# Patient Record
Sex: Female | Born: 1965 | Race: White | Hispanic: No | Marital: Married | Smoking: Former smoker
Health system: Southern US, Community
[De-identification: ages and names within clinical notes are randomized; demographics above are authoritative.]

## PROBLEM LIST (undated history)

## (undated) DIAGNOSIS — T8859XA Other complications of anesthesia, initial encounter: Secondary | ICD-10-CM

## (undated) DIAGNOSIS — K635 Polyp of colon: Secondary | ICD-10-CM

## (undated) DIAGNOSIS — I639 Cerebral infarction, unspecified: Secondary | ICD-10-CM

## (undated) DIAGNOSIS — J45909 Unspecified asthma, uncomplicated: Secondary | ICD-10-CM

## (undated) DIAGNOSIS — T4145XA Adverse effect of unspecified anesthetic, initial encounter: Secondary | ICD-10-CM

## (undated) DIAGNOSIS — N840 Polyp of corpus uteri: Secondary | ICD-10-CM

## (undated) DIAGNOSIS — Z87442 Personal history of urinary calculi: Secondary | ICD-10-CM

## (undated) DIAGNOSIS — F32A Depression, unspecified: Secondary | ICD-10-CM

## (undated) DIAGNOSIS — K219 Gastro-esophageal reflux disease without esophagitis: Secondary | ICD-10-CM

## (undated) DIAGNOSIS — F319 Bipolar disorder, unspecified: Secondary | ICD-10-CM

## (undated) DIAGNOSIS — A4151 Sepsis due to Escherichia coli [E. coli]: Secondary | ICD-10-CM

## (undated) DIAGNOSIS — K259 Gastric ulcer, unspecified as acute or chronic, without hemorrhage or perforation: Secondary | ICD-10-CM

## (undated) DIAGNOSIS — G4730 Sleep apnea, unspecified: Secondary | ICD-10-CM

## (undated) DIAGNOSIS — E039 Hypothyroidism, unspecified: Secondary | ICD-10-CM

## (undated) DIAGNOSIS — E785 Hyperlipidemia, unspecified: Secondary | ICD-10-CM

## (undated) DIAGNOSIS — N10 Acute tubulo-interstitial nephritis: Secondary | ICD-10-CM

## (undated) DIAGNOSIS — F329 Major depressive disorder, single episode, unspecified: Secondary | ICD-10-CM

## (undated) HISTORY — DX: Major depressive disorder, single episode, unspecified: F32.9

## (undated) HISTORY — DX: Polyp of corpus uteri: N84.0

## (undated) HISTORY — DX: Unspecified asthma, uncomplicated: J45.909

## (undated) HISTORY — DX: Gastric ulcer, unspecified as acute or chronic, without hemorrhage or perforation: K25.9

## (undated) HISTORY — DX: Polyp of colon: K63.5

## (undated) HISTORY — PX: OTHER SURGICAL HISTORY: SHX169

## (undated) HISTORY — PX: COLONOSCOPY: SHX174

## (undated) HISTORY — DX: Acute pyelonephritis: N10

## (undated) HISTORY — PX: ABLATION ON ENDOMETRIOSIS: SHX5787

## (undated) HISTORY — DX: Gastro-esophageal reflux disease without esophagitis: K21.9

## (undated) HISTORY — DX: Depression, unspecified: F32.A

## (undated) HISTORY — DX: Hyperlipidemia, unspecified: E78.5

## (undated) HISTORY — PX: TEMPOROMANDIBULAR JOINT SURGERY: SHX35

## (undated) HISTORY — DX: Cerebral infarction, unspecified: I63.9

## (undated) HISTORY — DX: Bipolar disorder, unspecified: F31.9

## (undated) HISTORY — DX: Hypothyroidism, unspecified: E03.9

## (undated) HISTORY — PX: TUBAL LIGATION: SHX77

## (undated) HISTORY — PX: OVARIAN CYST SURGERY: SHX726

---
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
## 1997-11-23 ENCOUNTER — Ambulatory Visit (HOSPITAL_COMMUNITY): Admission: RE | Admit: 1997-11-23 | Discharge: 1997-11-23 | Payer: Self-pay | Admitting: Oral Surgery

## 1997-12-31 ENCOUNTER — Ambulatory Visit (HOSPITAL_COMMUNITY): Admission: RE | Admit: 1997-12-31 | Discharge: 1997-12-31 | Payer: Self-pay | Admitting: Oral Surgery

## 2018-01-14 DIAGNOSIS — G4733 Obstructive sleep apnea (adult) (pediatric): Secondary | ICD-10-CM | POA: Diagnosis not present

## 2018-01-15 ENCOUNTER — Encounter: Payer: Self-pay | Admitting: Family Medicine

## 2018-01-15 ENCOUNTER — Ambulatory Visit (INDEPENDENT_AMBULATORY_CARE_PROVIDER_SITE_OTHER): Payer: Medicare Other | Admitting: Family Medicine

## 2018-01-15 VITALS — BP 112/78 | HR 92 | Temp 98.2°F | Ht 65.0 in | Wt 211.0 lb

## 2018-01-15 DIAGNOSIS — Z8719 Personal history of other diseases of the digestive system: Secondary | ICD-10-CM

## 2018-01-15 DIAGNOSIS — Z8711 Personal history of peptic ulcer disease: Secondary | ICD-10-CM | POA: Insufficient documentation

## 2018-01-15 DIAGNOSIS — N950 Postmenopausal bleeding: Secondary | ICD-10-CM | POA: Insufficient documentation

## 2018-01-15 DIAGNOSIS — F3177 Bipolar disorder, in partial remission, most recent episode mixed: Secondary | ICD-10-CM | POA: Insufficient documentation

## 2018-01-15 DIAGNOSIS — Z7689 Persons encountering health services in other specified circumstances: Secondary | ICD-10-CM | POA: Diagnosis not present

## 2018-01-15 DIAGNOSIS — K635 Polyp of colon: Secondary | ICD-10-CM | POA: Insufficient documentation

## 2018-01-15 DIAGNOSIS — K2100 Gastro-esophageal reflux disease with esophagitis, without bleeding: Secondary | ICD-10-CM | POA: Insufficient documentation

## 2018-01-15 DIAGNOSIS — E039 Hypothyroidism, unspecified: Secondary | ICD-10-CM | POA: Insufficient documentation

## 2018-01-15 NOTE — Progress Notes (Signed)
Subjective: QA:STMHDQQIW care, postmenopausal bleeding HPI: Rebecca Moreno is a 52 y.o. female presenting to clinic today for:  1.  Postmenopausal bleeding Patient reports that she had onset of postmenopausal bleeding last month that lasted about 2 weeks.  She certain that this is coming from the vagina and not the rectum.  She states that she had been without a period for over 7 years.  Denies any preceding vaginal discharge, pelvic pain or postcoital bleeding.  She has a history of BTL.  She has 1 daughter who is healthy.  She has had some associated breast tenderness and nipple sensitivity but otherwise, no other associated symptoms.  She notes that the bleeding has stopped but she was very concerned because she has not had a period in years.  No known personal or family history of bleeding disorders.  She does report that her mom was treated with a blood thinner at some point but she is unsure why.  Denies any family history of breast cancers.  She does note a maternal grandmother who had both ovarian and colon cancer that metastasized and ultimately led to her death.  She notes her sister had a history of uterine cancer in her 56s.  Patient reports her mammogram was over 2 years ago and had a spot that was concerning but after further evaluation it was determined to be normal.  She is never had an abnormal Pap.  Last Pap smear was about 3 years ago.  2.  Thyroid disorder Patient reports about a 7-year history of hypothyroidism.  She was noted to have a goiter and was started on medication.  Her goiter subsequently got smaller and she has been doing well since.  She does have history of anxiety.  She denies diarrhea or constipation but does note large bowel movements which cause rectal pain and sometimes bleeding.  See below  3.  History of rectal pain and bleeding/GERD/stomach ulcers Patient with history of rectal pain and bleeding from large bowel movements.  She has a history of acid reflux  and stomach ulcers.  She uses a PPI and occasionally Carafate if needed.  She was previously followed by gastroenterology in [place].  She has a history of colonic polyp on colonoscopy.  Last colonoscopy was about 5 years ago.  She notes that she is overdue.  She has had no unplanned weight loss, night sweats.  She does report some unplanned weight gain.  4.  Bipolar disorder Patient reports a long-standing history of bipolar disorder.  She reports mixed manic and depressive symptoms.  She has had a total of 5 hospitalizations for psychosis associated with bipolar disorder.  She states that she was having hallucinations.  Denies any history of SI or HI.  She was treated by psychiatry in [place] state and states that she is finally stable on a good medication regimen.  She reports compliance with her medications and states that she needs no refills at this time because her psychiatrist provided her enough to get through.  She has not yet established with psychiatry here but is very interested in establishing.  She prefers to see someone in [place] if possible.  Past Medical History:  Diagnosis Date  . Asthma   . Bipolar disorder ([place])   . Colon polyp   . Depression    Bipolar  . Gastric ulcer   . Hypothyroid    Past Surgical History:  Procedure Laterality Date  . ABLATION ON ENDOMETRIOSIS    . TUBAL LIGATION  Social History   Socioeconomic History  . Marital status: Married    Spouse name: Not on file  . Number of children: Not on file  . Years of education: Not on file  . Highest education level: Not on file  Occupational History  . Not on file  Social Needs  . Financial resource strain: Not on file  . Food insecurity:    Worry: Not on file    Inability: Not on file  . Transportation needs:    Medical: Not on file    Non-medical: Not on file  Tobacco Use  . Smoking status: Former Smoker    Types: Cigarettes    Last attempt to quit: 05/07/2012    Years since  quitting: 5.6  . Smokeless tobacco: Never Used  Substance and Sexual Activity  . Alcohol use: Never    Frequency: Never  . Drug use: Never  . Sexual activity: Not on file  Lifestyle  . Physical activity:    Days per week: Not on file    Minutes per session: Not on file  . Stress: Not on file  Relationships  . Social connections:    Talks on phone: Not on file    Gets together: Not on file    Attends religious service: Not on file    Active member of club or organization: Not on file    Attends meetings of clubs or organizations: Not on file    Relationship status: Not on file  . Intimate partner violence:    Fear of current or ex partner: Not on file    Emotionally abused: Not on file    Physically abused: Not on file    Forced sexual activity: Not on file  Other Topics Concern  . Not on file  Social History Narrative  . Not on file   Current Meds  Medication Sig  . albuterol (PROVENTIL HFA;VENTOLIN HFA) 108 (90 Base) MCG/ACT inhaler Inhale 1 puff into the lungs every 6 (six) hours as needed for wheezing or shortness of breath.  Marland Kitchen CALCIUM CITRATE-VITAMIN D3 PO Take 1 tablet by mouth daily.  . Cariprazine HCl (VRAYLAR) 6 MG CAPS Take 1 capsule by mouth daily.  . Cholecalciferol (VITAMIN D3) 5000 units TABS Take 1 tablet by mouth daily.  . clonazePAM (KLONOPIN) 1 MG tablet Take 1 mg by mouth 2 (two) times daily as needed.  . Cyanocobalamin (VITAMIN B 12 PO) Take 1,000 mcg by mouth daily.  Marland Kitchen Dexlansoprazole (DEXILANT) 30 MG capsule Take 30 mg by mouth daily.  . fluticasone (FLOVENT HFA) 220 MCG/ACT inhaler Inhale 1 puff into the lungs 2 (two) times daily.  Marland Kitchen lamoTRIgine (LAMICTAL) 100 MG tablet Take 300 mg by mouth at bedtime.  Marland Kitchen levothyroxine (SYNTHROID, LEVOTHROID) 50 MCG tablet Take 50 mcg by mouth daily before breakfast.  . Magnesium 500 MG TABS Take 1 tablet by mouth daily.  . Multiple Vitamins-Minerals (MULTIVITAMIN WOMEN PO) Take 1 tablet by mouth daily.  . QUEtiapine  (SEROQUEL) 100 MG tablet Take 1 to 3 tablets at bedtime  . QUEtiapine (SEROQUEL) 50 MG tablet Take 25 mg by mouth 2 (two) times daily as needed.  . ranitidine (ZANTAC) 75 MG tablet Take 75 mg by mouth daily.  Marland Kitchen venlafaxine XR (EFFEXOR-XR) 150 MG 24 hr capsule Take 300 mg by mouth daily.   Family History  Problem Relation Age of Onset  . Anxiety disorder Mother   . Hypertension Mother   . Heart failure Mother   . Stroke  Mother   . Hyperlipidemia Father   . Diabetes Father   . Stroke Father   . Hypertension Father   . Diabetes Sister   . Hypertension Sister   . Uterine cancer Sister   . Ovarian cancer Maternal Grandmother   . Colon cancer Maternal Grandmother   . Renal Disease Paternal Grandmother    Allergies  Allergen Reactions  . Lithium Other (See Comments)    Psoriasis      Health Maintenance: TBD  ROS: Per HPI  Objective: Office vital signs reviewed. BP 112/78   Pulse 92   Temp 98.2 F (36.8 C)   Ht '5\' 5"'$  (1.651 m)   Wt 211 lb (95.7 kg)   LMP 12/13/2017 (Approximate)   BMI 35.11 kg/m   Physical Examination:  General: Awake, alert, well nourished, nontoxic. No acute distress HEENT: Normal    Neck: No masses palpated. No lymphadenopathy; no appreciable goiters    Eyes: PERRLA, extraocular movement in tact, sclera white.  No exophthalmos.  No conjunctival pallor    Throat: moist mucus membranes Cardio: regular rate and rhythm, S1S2 heard, no murmurs appreciated Pulm: clear to auscultation bilaterally, no wheezes, rhonchi or rales; normal work of breathing on room air GI: soft, mild left lower quadrant and mid lower abdominal discomfort, no peritoneal signs.  Full feeling but not overtly distended, bowel sounds present x4, no hepatomegaly, no splenomegaly, no masses GU: Uterus palpated externally and feels somewhat fuller than expected.  No dominant masses palpated. Extremities: warm, well perfused, No edema, cyanosis or clubbing; +2 pulses bilaterally Skin:  Normal temperature.  No lesions noted. Neuro: Mild tremor noted.  Follows all commands.  No focal neurologic deficits. Psych: Mood stable, speech normal, affect appropriate, pleasant, does not appear to be responding to internal stimuli. Depression screen PHQ 2/9 01/15/2018  Decreased Interest 2  Down, Depressed, Hopeless 2  PHQ - 2 Score 4  Altered sleeping 2  Tired, decreased energy 3  Change in appetite 2  Feeling bad or failure about yourself  2  Trouble concentrating 1  Moving slowly or fidgety/restless 1  Suicidal thoughts 0  PHQ-9 Score 15   No flowsheet data found.  Assessment/ Plan: 52 y.o. female   1. Post-menopausal bleeding Stat pelvic and transvaginal ultrasound ordered.  Referral to gynecology placed.  Check CBC, TSH and CMP.  We discussed possible differentials including malignancy.  Bleeding has stopped.  There is no evidence of anemia on exam. - US Transvaginal Non-OB; Future - US Pelvis Complete; Future - Ambulatory referral to Obstetrics / Gynecology - CMP14+EGFR - CBC with Differential - TSH  2. Establishing care with new doctor, encounter for Release of information form completed for PCP notes, psychiatry notes and gastroenterology notes.  We will plan to tackle preventative care needs and other chronic health care needs at our next visit in 3 months once we have been able to get her records from her previous providers.  3. Acquired hypothyroidism Check TSH.  Currently on Synthroid 50 mcg daily.  Small resting tremor noted. - TSH  4. Polyp of colon, unspecified part of colon, unspecified type Will refer to gastroenterology given history of ulcer, acid reflux and polyps.  She is due for repeat colonoscopy.  Will obtain records as above. - Ambulatory referral to Gastroenterology - CBC with Differential  5. History of gastric ulcer  6. Bipolar disorder, in partial remission, most recent episode mixed ([place]) Currently controlled.  She has refills of  medications.  I have placed a  referral to psychiatry and will check her lipid, metabolic panel and CBC.  If she is unable to secure an appointment in a timely manner, I did recommend that she return for reevaluation.  I can extend her medications until she is seen by specialist if needed. - Ambulatory referral to Psychiatry - CMP14+EGFR - Lipid Panel - CBC with Differential  7. Gastroesophageal reflux disease with esophagitis As above.     [name], DO [place] [phone]

## 2018-01-15 NOTE — Patient Instructions (Signed)
You had labs performed today.  You will be contacted with the results of the labs once they are available, usually in the next 3 business days for routine lab work.    Postmenopausal Bleeding Postmenopausal bleeding is any bleeding after menopause. Menopause is when a woman's period stops. Any type of bleeding after menopause is concerning. It should be checked by your doctor. Any treatment will depend on the cause. Follow these instructions at home: Watch your condition for any changes.  Avoid the use of tampons and douches as told by your doctor.  Change your pads often.  Get regular pelvic exams and Pap tests.  Keep all appointments for tests as told by your doctor.  Contact a doctor if:  Your bleeding lasts for more than 1 week.  You have belly (abdominal) pain.  You have bleeding after sex (intercourse). Get help right away if:  You have a fever, chills, a headache, dizziness, muscle aches, and bleeding.  You have strong pain with bleeding.  You have clumps of blood (blood clots) coming from your vagina.  You have bleeding and need more than 1 pad an hour.  You feel like you are going to pass out (faint). This information is not intended to replace advice given to you by your health care provider. Make sure you discuss any questions you have with your health care provider. Document Released: 01/31/2008 Document Revised: 09/29/2015 Document Reviewed: 11/20/2012 Elsevier Interactive Patient Education  2017 [employer].

## 2018-01-16 ENCOUNTER — Encounter: Payer: Self-pay | Admitting: Internal Medicine

## 2018-01-16 LAB — LIPID PANEL
CHOLESTEROL TOTAL: 268 mg/dL — AB (ref 100–199)
Chol/HDL Ratio: 4.8 ratio — ABNORMAL HIGH (ref 0.0–4.4)
HDL: 56 mg/dL (ref >39)
LDL CALC: 168 mg/dL — AB (ref 0–99)
TRIGLYCERIDES: 218 mg/dL — AB (ref 0–149)
VLDL Cholesterol Cal: 44 mg/dL — ABNORMAL HIGH (ref 5–40)

## 2018-01-16 LAB — CMP14+EGFR
ALK PHOS: 109 IU/L (ref 39–117)
ALT: 57 IU/L — AB (ref 0–32)
AST: 40 IU/L (ref 0–40)
Albumin/Globulin Ratio: 2.3 — ABNORMAL HIGH (ref 1.2–2.2)
Albumin: 4.5 g/dL (ref 3.5–5.5)
BUN/Creatinine Ratio: 9 (ref 9–23)
BUN: 9 mg/dL (ref 6–24)
Bilirubin Total: 0.2 mg/dL (ref 0.0–1.2)
CO2: 27 mmol/L (ref 20–29)
CREATININE: 1.01 mg/dL — AB (ref 0.57–1.00)
Calcium: 9.7 mg/dL (ref 8.7–10.2)
Chloride: 100 mmol/L (ref 96–106)
GFR calc Af Amer: 73 mL/min/{1.73_m2} (ref >59)
GFR calc non Af Amer: 64 mL/min/{1.73_m2} (ref >59)
GLUCOSE: 81 mg/dL (ref 65–99)
Globulin, Total: 2 g/dL (ref 1.5–4.5)
Potassium: 4.8 mmol/L (ref 3.5–5.2)
SODIUM: 142 mmol/L (ref 134–144)
Total Protein: 6.5 g/dL (ref 6.0–8.5)

## 2018-01-16 LAB — CBC WITH DIFFERENTIAL/PLATELET
BASOS: 1 %
Basophils Absolute: 0.1 10*3/uL (ref 0.0–0.2)
EOS (ABSOLUTE): 0.2 10*3/uL (ref 0.0–0.4)
Eos: 3 %
Hematocrit: 40 % (ref 34.0–46.6)
Hemoglobin: 13.7 g/dL (ref 11.1–15.9)
IMMATURE GRANS (ABS): 0.1 10*3/uL (ref 0.0–0.1)
Immature Granulocytes: 1 %
LYMPHS: 28 %
Lymphocytes Absolute: 1.9 10*3/uL (ref 0.7–3.1)
MCH: 34.1 pg — AB (ref 26.6–33.0)
MCHC: 34.3 g/dL (ref 31.5–35.7)
MCV: 100 fL — AB (ref 79–97)
Monocytes Absolute: 0.7 10*3/uL (ref 0.1–0.9)
Monocytes: 9 %
NEUTROS ABS: 4 10*3/uL (ref 1.4–7.0)
Neutrophils: 58 %
PLATELETS: 271 10*3/uL (ref 150–450)
RBC: 4.02 x10E6/uL (ref 3.77–5.28)
RDW: 12.6 % (ref 12.3–15.4)
WBC: 6.9 10*3/uL (ref 3.4–10.8)

## 2018-01-16 LAB — TSH: TSH: 2.47 u[IU]/mL (ref 0.450–4.500)

## 2018-01-17 ENCOUNTER — Other Ambulatory Visit: Payer: Self-pay

## 2018-01-17 DIAGNOSIS — N950 Postmenopausal bleeding: Secondary | ICD-10-CM

## 2018-01-21 ENCOUNTER — Encounter: Payer: Self-pay | Admitting: Family Medicine

## 2018-01-21 DIAGNOSIS — M810 Age-related osteoporosis without current pathological fracture: Secondary | ICD-10-CM | POA: Insufficient documentation

## 2018-01-21 DIAGNOSIS — L409 Psoriasis, unspecified: Secondary | ICD-10-CM | POA: Insufficient documentation

## 2018-01-24 ENCOUNTER — Ambulatory Visit (HOSPITAL_COMMUNITY)
Admission: RE | Admit: 2018-01-24 | Discharge: 2018-01-24 | Disposition: A | Discharge status: Home / Self Care | Payer: Medicare Other | Source: Ambulatory Visit | Attending: Family Medicine | Admitting: Family Medicine

## 2018-01-24 DIAGNOSIS — N950 Postmenopausal bleeding: Secondary | ICD-10-CM | POA: Diagnosis not present

## 2018-01-27 ENCOUNTER — Telehealth: Payer: Self-pay | Admitting: Adult Health

## 2018-01-27 NOTE — Telephone Encounter (Signed)
Called pt to let her know she needs endometrial biopsy , I don't do those, will make her appt with Dr Elonda Husky

## 2018-01-31 ENCOUNTER — Encounter: Payer: Medicare Other | Admitting: Adult Health

## 2018-02-06 ENCOUNTER — Other Ambulatory Visit: Payer: Self-pay | Admitting: Obstetrics & Gynecology

## 2018-02-06 ENCOUNTER — Ambulatory Visit (INDEPENDENT_AMBULATORY_CARE_PROVIDER_SITE_OTHER): Payer: Medicare Other | Admitting: Obstetrics & Gynecology

## 2018-02-06 ENCOUNTER — Encounter: Payer: Self-pay | Admitting: Obstetrics & Gynecology

## 2018-02-06 VITALS — BP 140/85 | HR 90 | Ht 66.0 in | Wt 214.0 lb

## 2018-02-06 DIAGNOSIS — N840 Polyp of corpus uteri: Secondary | ICD-10-CM | POA: Diagnosis not present

## 2018-02-06 DIAGNOSIS — N950 Postmenopausal bleeding: Secondary | ICD-10-CM

## 2018-02-06 DIAGNOSIS — B373 Candidiasis of vulva and vagina: Secondary | ICD-10-CM

## 2018-02-06 DIAGNOSIS — R9389 Abnormal findings on diagnostic imaging of other specified body structures: Secondary | ICD-10-CM

## 2018-02-06 DIAGNOSIS — B3731 Acute candidiasis of vulva and vagina: Secondary | ICD-10-CM

## 2018-02-06 MED ORDER — FLUCONAZOLE 100 MG PO TABS: 100.0000 mg | ORAL_TABLET | Freq: Every day | ORAL | 0 refills | Status: DC

## 2018-02-06 NOTE — Progress Notes (Signed)
Endometrial Biopsy Procedure Note  Pre-operative Diagnosis: PMB with thickened endometrium  Post-operative Diagnosis: same  Indications: postmenopausal bleeding with 10 mm endometrial stripe  Procedure Details   Urine pregnancy test was not done.  The risks (including infection, bleeding, pain, and uterine perforation) and benefits of the procedure were explained to the patient and Written informed consent was obtained.  Antibiotic prophylaxis against endocarditis was not indicated.   The patient was placed in the dorsal lithotomy position.  Bimanual exam showed the uterus to be in the neutral position.  A Graves' speculum inserted in the vagina, and the cervix prepped with povidone iodine.  Endocervical curettage with a Kevorkian curette was not performed.   A sharp tenaculum was applied to the anterior lip of the cervix for stabilization.  A sterile uterine sound was used to sound the uterus to a depth of 6.5cm.  A Mylex 43mm curette was used to sample the endometrium.  Sample was sent for pathologic examination.  Condition: Stable  Complications: None  Plan:  The patient was advised to call for any fever or for prolonged or severe pain or bleeding. She was advised to use OTC analgesics as needed for mild to moderate pain. She was advised to avoid vaginal intercourse for 48 hours or until the bleeding has completely stopped.  Attending Physician Documentation: I performed the procedure   Also has significant thickened changes consistent with yeast, no symptoms Gentian violet placed wiil give of diflucan

## 2018-02-06 NOTE — Addendum Note (Signed)
Addended by: Linton Rump on: 02/06/2018 03:14 PM   Modules accepted: Orders

## 2018-02-13 ENCOUNTER — Ambulatory Visit (INDEPENDENT_AMBULATORY_CARE_PROVIDER_SITE_OTHER): Payer: Medicare Other | Admitting: Obstetrics & Gynecology

## 2018-02-13 ENCOUNTER — Encounter: Payer: Self-pay | Admitting: Obstetrics & Gynecology

## 2018-02-13 VITALS — BP 155/97 | HR 115 | Ht 66.0 in | Wt 221.0 lb

## 2018-02-13 DIAGNOSIS — N840 Polyp of corpus uteri: Secondary | ICD-10-CM

## 2018-02-13 MED ORDER — PNEUMOCOCCAL VAC POLYVALENT 25 MCG/0.5ML IJ INJ: INJECTION | INTRAMUSCULAR | 0 refills | Status: DC

## 2018-02-13 NOTE — Progress Notes (Signed)
Follow up appointment for results  Chief Complaint  Patient presents with  . Follow-up    biopsy results    Blood pressure (!) 155/97, pulse (!) 115, height 5\' 6"  (1.676 m), weight 221 lb (100.2 kg), last menstrual period 12/13/2017.    Endometrial biopsy reveals a benign endometrial polyp and inactive endometrium Pt informed of the findings and discussed management plan in detail  MEDS ordered this encounter: Meds ordered this encounter  Medications  . pneumococcal 23 valent vaccine (PNEUMOVAX 23) 25 MCG/0.5ML injection    Sig: Inject 0.5 cc into muscle    Dispense:  0.5 mL    Refill:  0    Orders for this encounter: No orders of the defined types were placed in this encounter.   Impression: Endometrial polyp   Plan: Will manage conservatively for now, if begins more significant bleeding then will meed hysteroscopy D&C, probable ablation  Follow Up: Return if symptoms worsen or fail to improve, for worsening bleeding.       Face to face time:  10 minutes  Greater than 50% of the visit time was spent in counseling and coordination of care with the patient.  The summary and outline of the counseling and care coordination is summarized in the note above.   All questions were answered.  Past Medical History:  Diagnosis Date  . Asthma   . Bipolar disorder ([place])   . Colon polyp   . Depression    Bipolar  . Gastric ulcer   . Hypothyroid     Past Surgical History:  Procedure Laterality Date  . ABLATION ON ENDOMETRIOSIS    . TEMPOROMANDIBULAR JOINT SURGERY     9 surgeries  . TUBAL LIGATION      OB History    Gravida  1   Para  1   Term  1   Preterm      AB      Living  1     SAB      TAB      Ectopic      Multiple      Live Births  1           Allergies  Allergen Reactions  . Lithium Other (See Comments)    Psoriasis     Social History   Socioeconomic History  . Marital status: Married    Spouse name: Not on file  .  Number of children: Not on file  . Years of education: Not on file  . Highest education level: Not on file  Occupational History  . Not on file  Social Needs  . Financial resource strain: Not on file  . Food insecurity:    Worry: Not on file    Inability: Not on file  . Transportation needs:    Medical: Not on file    Non-medical: Not on file  Tobacco Use  . Smoking status: Former Smoker    Types: Cigarettes    Last attempt to quit: 05/07/2012    Years since quitting: 5.7  . Smokeless tobacco: Never Used  Substance and Sexual Activity  . Alcohol use: Never    Frequency: Never  . Drug use: Not Currently  . Sexual activity: Not Currently    Birth control/protection: Surgical    Comment: tubal  Lifestyle  . Physical activity:    Days per week: Not on file    Minutes per session: Not on file  . Stress: Not on file  Relationships  .  Social connections:    Talks on phone: Not on file    Gets together: Not on file    Attends religious service: Not on file    Active member of club or organization: Not on file    Attends meetings of clubs or organizations: Not on file    Relationship status: Not on file  Other Topics Concern  . Not on file  Social History Narrative  . Not on file    Family History  Problem Relation Age of Onset  . Anxiety disorder Mother   . Hypertension Mother   . Heart failure Mother   . Stroke Mother   . Hyperlipidemia Father   . Diabetes Father   . Stroke Father   . Hypertension Father   . Diabetes Sister   . Hypertension Sister   . Uterine cancer Sister   . Ovarian cancer Maternal Grandmother   . Colon cancer Maternal Grandmother   . Renal Disease Paternal Grandmother

## 2018-02-20 ENCOUNTER — Encounter: Payer: Self-pay | Admitting: *Deleted

## 2018-03-04 ENCOUNTER — Telehealth: Payer: Self-pay | Admitting: Family Medicine

## 2018-03-04 ENCOUNTER — Ambulatory Visit (INDEPENDENT_AMBULATORY_CARE_PROVIDER_SITE_OTHER): Payer: Medicare Other | Admitting: Family Medicine

## 2018-03-04 ENCOUNTER — Encounter: Payer: Self-pay | Admitting: Family Medicine

## 2018-03-04 VITALS — BP 132/89 | HR 94 | Temp 98.3°F | Ht 66.0 in | Wt 221.0 lb

## 2018-03-04 DIAGNOSIS — R300 Dysuria: Secondary | ICD-10-CM

## 2018-03-04 DIAGNOSIS — N3090 Cystitis, unspecified without hematuria: Secondary | ICD-10-CM

## 2018-03-04 DIAGNOSIS — R310 Gross hematuria: Secondary | ICD-10-CM

## 2018-03-04 LAB — URINALYSIS, COMPLETE
Bilirubin, UA: NEGATIVE
Glucose, UA: NEGATIVE
Ketones, UA: NEGATIVE
Nitrite, UA: NEGATIVE
Protein, UA: NEGATIVE
Specific Gravity, UA: 1.01 (ref 1.005–1.030)
Urobilinogen, Ur: 0.2 mg/dL (ref 0.2–1.0)
pH, UA: 7 (ref 5.0–7.5)

## 2018-03-04 LAB — MICROSCOPIC EXAMINATION: RENAL EPITHEL UA: NONE SEEN /HPF

## 2018-03-04 MED ORDER — SULFAMETHOXAZOLE-TRIMETHOPRIM 800-160 MG PO TABS: 1.0000 | ORAL_TABLET | Freq: Two times a day (BID) | ORAL | 0 refills | Status: DC

## 2018-03-04 NOTE — Progress Notes (Signed)
Chief Complaint  Patient presents with  . Back Pain  . Hematuria    HPI  Patient presents today for burning with urination and frequency for several days. Denies fever . Has bilateral flank pain. No nausea, vomiting. Onset 2-3 days ago.   PMH: Smoking status noted ROS: Per HPI  Objective: BP 132/89   Pulse 94   Temp 98.3 F (36.8 C) (Oral)   Ht 5\' 6"  (1.676 m)   Wt 221 lb (100.2 kg)   LMP 12/13/2017 (Approximate)   BMI 35.67 kg/m  Gen: NAD, alert, cooperative with exam HEENT: NCAT, EOMI, PERRL CV: RRR, good S1/S2, no murmur Resp: CTABL, no wheezes, non-labored Abd: mild hypogastric tenderness. Ext: No edema, warm Neuro: Alert and oriented, No gross deficits  Assessment and plan:  1. Cystitis   2. Dysuria     Meds ordered this encounter  Medications  . sulfamethoxazole-trimethoprim (BACTRIM DS,SEPTRA DS) 800-160 MG tablet    Sig: Take 1 tablet by mouth 2 (two) times daily.    Dispense:  14 tablet    Refill:  0    Orders Placed This Encounter  Procedures  . Urinalysis, Complete    Follow up as needed.  Claretta Fraise, MD

## 2018-03-04 NOTE — Telephone Encounter (Signed)
Patient aware.

## 2018-03-04 NOTE — Telephone Encounter (Signed)
Please let her know that she just needs to leave a urine specimen to make sure the blood is completely gone. Thanks, WS

## 2018-03-04 NOTE — Telephone Encounter (Signed)
Seen Stacks today- please advise

## 2018-03-04 NOTE — Progress Notes (Signed)
urin

## 2018-03-05 LAB — URINE CULTURE
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
## 2018-03-10 ENCOUNTER — Encounter (HOSPITAL_COMMUNITY): Payer: Self-pay | Admitting: Psychiatry

## 2018-03-10 ENCOUNTER — Ambulatory Visit (INDEPENDENT_AMBULATORY_CARE_PROVIDER_SITE_OTHER): Payer: Medicare Other | Admitting: Psychiatry

## 2018-03-10 VITALS — BP 137/81 | HR 88 | Ht 66.0 in | Wt 220.0 lb

## 2018-03-10 DIAGNOSIS — F39 Unspecified mood [affective] disorder: Secondary | ICD-10-CM

## 2018-03-10 NOTE — Patient Instructions (Signed)
1. Continue Venlafaxine 300 mg daily  2. Continue Vraylar 6 mg  3. Continue Lamotrigine 300 mg daily  4. Continue Quetiapine 300 mg at night  5. Continue Clonazepam 1 mg twice a day 6. Referral to therapy in [facility] 7. Return to clinic in one month for 30 mins  8. Obtain record from your previous psychiatrist

## 2018-04-07 ENCOUNTER — Telehealth: Payer: Self-pay | Admitting: Internal Medicine

## 2018-04-07 ENCOUNTER — Encounter: Payer: Self-pay | Admitting: Gastroenterology

## 2018-04-07 ENCOUNTER — Other Ambulatory Visit: Payer: Self-pay | Admitting: *Deleted

## 2018-04-07 ENCOUNTER — Telehealth: Payer: Self-pay | Admitting: *Deleted

## 2018-04-07 ENCOUNTER — Ambulatory Visit (INDEPENDENT_AMBULATORY_CARE_PROVIDER_SITE_OTHER): Payer: Medicare Other | Admitting: Gastroenterology

## 2018-04-07 ENCOUNTER — Encounter: Payer: Self-pay | Admitting: *Deleted

## 2018-04-07 VITALS — BP 134/87 | HR 96 | Temp 97.2°F | Ht 66.0 in | Wt 219.0 lb

## 2018-04-07 DIAGNOSIS — Z86010 Personal history of colonic polyps: Secondary | ICD-10-CM | POA: Diagnosis not present

## 2018-04-07 DIAGNOSIS — R7401 Elevation of levels of liver transaminase levels: Secondary | ICD-10-CM | POA: Insufficient documentation

## 2018-04-07 DIAGNOSIS — Z789 Other specified health status: Secondary | ICD-10-CM | POA: Insufficient documentation

## 2018-04-07 DIAGNOSIS — R740 Nonspecific elevation of levels of transaminase and lactic acid dehydrogenase [LDH]: Secondary | ICD-10-CM

## 2018-04-07 DIAGNOSIS — K219 Gastro-esophageal reflux disease without esophagitis: Secondary | ICD-10-CM | POA: Diagnosis not present

## 2018-04-07 DIAGNOSIS — K625 Hemorrhage of anus and rectum: Secondary | ICD-10-CM | POA: Insufficient documentation

## 2018-04-07 MED ORDER — NA SULFATE-K SULFATE-MG SULF 17.5-3.13-1.6 GM/177ML PO SOLN: 1.0000 | ORAL | 0 refills | Status: DC

## 2018-04-07 NOTE — Assessment & Plan Note (Signed)
Well-controlled on current regimen, Dexilant in the mornings and additional evening over-the-counter medication.  Unclear what she is taking in the evenings, suspect H2-blocker, she will call with name of medication.  Reinforced antireflux measures.

## 2018-04-07 NOTE — Assessment & Plan Note (Addendum)
52 year old female with history of colon polyps approximately 5 years ago while living in [place].  She reports being overdue for surveillance colonoscopy.  She also has tissue hematochezia with each bowel movement, denies rectal pain.  Question internal hemorrhoids.  To be evaluated at time of colonoscopy.    Given polypharmacy plan for deep sedation.  I have discussed the risks, alternatives, benefits with regards to but not limited to the risk of reaction to medication, bleeding, infection, perforation and the patient is agreeable to proceed. Written consent to be obtained.

## 2018-04-07 NOTE — Telephone Encounter (Signed)
Pre-op scheduled for 05/06/18 at 1:45pm. Patient aware. Letter mailed.

## 2018-04-07 NOTE — Telephone Encounter (Signed)
Likely famotidine. Med list updated.

## 2018-04-07 NOTE — Progress Notes (Addendum)
Primary Care Physician:  [name], DO  Primary Gastroenterologist:  Garfield Cornea, MD   Chief Complaint  Patient presents with  . Consult    TCS. Last had done 5 yrs ago in [place].    HPI:  Rebecca Moreno is a 52 y.o. female here at the request of Dr. Lajuana Ripple for history of colon polyps, need for colonoscopy.  She reports a colonoscopy about 5 years ago while still living in [place] state.  She states she had polyps and is overdue for follow-up surveillance.  Reports family history of colon cancer, maternal grandmother, father had colon polyps greater than age of 18.  Recent labs in September showed mildly elevated ALT of 57.  Total cholesterol and LDL significantly elevated as well.  History of chronic GERD.  Remote gastric ulcer related to "stress".  She takes Dexilant every morning.  She used to be on Zantac in the evening but switched out to another H2 blocker due to recent concerns regarding Zantac (carcinogen).  For the most part her reflux is well controlled.  Denies dysphagia.  Occasional has some epigastric burning but usually related to certain foods that are spicy and with rare alcohol use.  Bowel movements are regular.  She reports her stools are large, having red blood on the toilet tissue each bowel movement.  Denies rectal pain.  No melena. Declines adding stool softener, miralax or rx medication to loosen stool at least until after colonoscopy. Denies constipation or hard stools.    Current Outpatient Medications  Medication Sig Dispense Refill  . albuterol (PROVENTIL HFA;VENTOLIN HFA) 108 (90 Base) MCG/ACT inhaler Inhale 1 puff into the lungs every 6 (six) hours as needed for wheezing or shortness of breath.    Marland Kitchen CALCIUM CITRATE-VITAMIN D3 PO Take 1 tablet by mouth daily.    . Cariprazine HCl (VRAYLAR) 6 MG CAPS Take 1 capsule by mouth daily.    . Cholecalciferol (VITAMIN D3) 5000 units TABS Take 1 tablet by mouth daily.    . clobetasol cream (TEMOVATE)  1.02 % Apply 1 application topically as needed.    . clonazePAM (KLONOPIN) 1 MG tablet Take 1 mg by mouth 2 (two) times daily as needed.  2  . Dexlansoprazole (DEXILANT) 30 MG capsule Take 30 mg by mouth daily.    . fluticasone (FLOVENT HFA) 220 MCG/ACT inhaler Inhale 1 puff into the lungs 2 (two) times daily.    Marland Kitchen lamoTRIgine (LAMICTAL) 100 MG tablet Take 300 mg by mouth at bedtime.    Marland Kitchen levothyroxine (SYNTHROID, LEVOTHROID) 50 MCG tablet Take 50 mcg by mouth daily before breakfast.    . Multiple Vitamins-Minerals (MULTIVITAMIN WOMEN PO) Take 1 tablet by mouth daily.    . naproxen (NAPROSYN) 250 MG tablet Take 250 mg by mouth 2 (two) times daily with a meal.    . QUEtiapine (SEROQUEL) 100 MG tablet Take 1 to 3 tablets at bedtime    . sucralfate (CARAFATE) 1 g tablet Take 1 g by mouth as needed.    . venlafaxine XR (EFFEXOR-XR) 150 MG 24 hr capsule Take 300 mg by mouth daily.     No current facility-administered medications for this visit.     Allergies as of 04/07/2018 - Review Complete 04/07/2018  Allergen Reaction Noted  . Lithium Other (See Comments) 01/15/2018    Past Medical History:  Diagnosis Date  . Asthma   . Bipolar disorder ([place])   . Colon polyp   . Depression    Bipolar  .  Gastric ulcer    around 2013.  stress related  . GERD (gastroesophageal reflux disease)   . Hyperlipidemia   . Hypothyroid     Past Surgical History:  Procedure Laterality Date  . ABLATION ON ENDOMETRIOSIS    . COLONOSCOPY     Per patient, done around 2013 in [place], had polyp and overdue for follow up.  . OVARIAN CYST SURGERY Left   . TEMPOROMANDIBULAR JOINT SURGERY     9 surgeries  . TUBAL LIGATION      Family History  Problem Relation Age of Onset  . Anxiety disorder Mother   . Hypertension Mother   . Heart failure Mother   . Stroke Mother   . Hyperlipidemia Father   . Diabetes Father   . Stroke Father   . Hypertension Father   . Colon polyps Father        older than 67   . Diabetes Sister   . Hypertension Sister   . Uterine cancer Sister   . Ovarian cancer Maternal Grandmother   . Colon cancer Maternal Grandmother   . Renal Disease Paternal Grandmother   . Bipolar disorder Other     Social History   Socioeconomic History  . Marital status: Married    Spouse name: Not on file  . Number of children: Not on file  . Years of education: Not on file  . Highest education level: Not on file  Occupational History  . Not on file  Social Needs  . Financial resource strain: Not on file  . Food insecurity:    Worry: Not on file    Inability: Not on file  . Transportation needs:    Medical: Not on file    Non-medical: Not on file  Tobacco Use  . Smoking status: Former Smoker    Types: Cigarettes    Last attempt to quit: 05/07/2012    Years since quitting: 5.9  . Smokeless tobacco: Never Used  Substance and Sexual Activity  . Alcohol use: Never    Frequency: Never  . Drug use: Not Currently  . Sexual activity: Not Currently    Birth control/protection: Surgical    Comment: tubal  Lifestyle  . Physical activity:    Days per week: Not on file    Minutes per session: Not on file  . Stress: Not on file  Relationships  . Social connections:    Talks on phone: Not on file    Gets together: Not on file    Attends religious service: Not on file    Active member of club or organization: Not on file    Attends meetings of clubs or organizations: Not on file    Relationship status: Not on file  . Intimate partner violence:    Fear of current or ex partner: Not on file    Emotionally abused: Not on file    Physically abused: Not on file    Forced sexual activity: Not on file  Other Topics Concern  . Not on file  Social History Narrative  . Not on file      ROS:  General: Negative for anorexia, weight loss, fever, chills, fatigue, weakness. Eyes: Negative for vision changes.  ENT: Negative for hoarseness, difficulty swallowing , nasal  congestion. CV: Negative for chest pain, angina, palpitations, dyspnea on exertion, peripheral edema.  Respiratory: Negative for dyspnea at rest, dyspnea on exertion, cough, sputum, wheezing.  GI: See history of present illness. GU:  Negative for dysuria, hematuria, urinary incontinence,  urinary frequency, nocturnal urination.  MS: Negative for joint pain, low back pain.  Derm: Negative for rash or itching.  Neuro: Negative for weakness, abnormal sensation, seizure, frequent headaches, memory loss, confusion.  Psych: Negative for anxiety, depression, suicidal ideation, hallucinations.  Endo: Negative for unusual weight change.  Heme: Negative for bruising or bleeding. Allergy: Negative for rash or hives.    Physical Examination:  BP 134/87   Pulse 96   Temp (!) 97.2 F (36.2 C) (Oral)   Ht 5\' 6"  (1.676 m)   Wt 219 lb (99.3 kg)   LMP 12/13/2017 (Approximate)   BMI 35.35 kg/m    General: Well-nourished, well-developed in no acute distress.  Head: Normocephalic, atraumatic.   Eyes: Conjunctiva pink, no icterus. Mouth: Oropharyngeal mucosa moist and pink , no lesions erythema or exudate. Neck: Supple without thyromegaly, masses, or lymphadenopathy.  Lungs: Clear to auscultation bilaterally.  Heart: Regular rate and rhythm, no murmurs rubs or gallops.  Abdomen: Bowel sounds are normal, mild lower abdominal tenderness, nondistended, no hepatosplenomegaly or masses, no abdominal bruits or    hernia , no rebound or guarding.   Rectal: Not performed Extremities: No lower extremity edema. No clubbing or deformities.  Neuro: Alert and oriented x 4 , grossly normal neurologically.  Skin: Warm and dry, no rash or jaundice.   Psych: Alert and cooperative, normal mood and affect.  Labs: Lab Results  Component Value Date   TSH 2.470 01/15/2018   Lab Results  Component Value Date   WBC 6.9 01/15/2018   HGB 13.7 01/15/2018   HCT 40.0 01/15/2018   MCV 100 (H) 01/15/2018   PLT 271  01/15/2018   Lab Results  Component Value Date   CREATININE 1.01 (H) 01/15/2018   BUN 9 01/15/2018   NA 142 01/15/2018   K 4.8 01/15/2018   CL 100 01/15/2018   CO2 27 01/15/2018   Lab Results  Component Value Date   ALT 57 (H) 01/15/2018   AST 40 01/15/2018   ALKPHOS 109 01/15/2018   BILITOT 0.2 01/15/2018     Imaging Studies: No results found.

## 2018-04-07 NOTE — Patient Instructions (Signed)
1. Colonoscopy as scheduled.  Please see separate instructions. 2. Continue Dexilant 60 mg daily.  Call back with name of other acid reflux medication to take in the evening. 3. Labs to evaluate abnormal liver labs, screen for hepatitis B and C.  You can take her orders with you to your upcoming appointment with Dr. Lajuana Ripple, possibly have drawn at the same time of their labs.

## 2018-04-07 NOTE — Telephone Encounter (Signed)
Pt was seen this morning and called back to give the nurse the name of her medication. She said it's a [facility] brand called Samotidine 10 mg

## 2018-04-07 NOTE — Telephone Encounter (Signed)
Noted routing message  

## 2018-04-07 NOTE — Telephone Encounter (Signed)
Noted  

## 2018-04-07 NOTE — Assessment & Plan Note (Addendum)
ALT 57 in the setting of hyperlipidemia, polypharmacy, obesity.  Consider screening for hepatitis B and C, hemochromatosis.  Recommend repeat LFTs as well.  If LFTs remain elevated, would recommend right upper quadrant ultrasound to evaluate for fatty liver.

## 2018-04-08 NOTE — Progress Notes (Signed)
CC'D TO PCP °

## 2018-04-11 NOTE — Progress Notes (Signed)
[facility] MD/PA/NP OP Progress Note  04/14/2018 2:12 PM Rebecca Moreno  MRN:  [id number]  Chief Complaint:  Chief Complaint    Other; Depression; Follow-up     HPI:  Patient presents for follow-up appointment for mood disorder.  She states that she stays depressed.  She burst into tears, stating that she wants to be "not like I am now" when she is asked what kind of person she wants to be. She states that she never liked herself since she started to have depression in her 35's. She wishes that her husband (in the room) likes her. She states that he would not be satisfied as she is not "princess." (Her husband left the room after the patient made this comment). She will be more active she is "happier." She feels stressed that her husband does not want to do pool with the patient as she is not "competitive." She agrees to try walking with her husband. She struggles with her mother in law with dementia at times, although she is having more "good days" with her mother in law.  She has hypersomnia.  She feels fatigue and depressed.  She has fair concentration and appetite.  She denies SI.  She feels anxious and tense at times.  She occasionally takes clonazepam.  She denies decreased need for sleep or euphoria.  She denies increased goal-directed activity.  She thinks that the medication is working well for her; likes the current regimen as it does not cause any side effect.    Wt Readings from Last 3 Encounters:  04/14/18 226 lb (102.5 kg)  04/07/18 219 lb (99.3 kg)  03/10/18 220 lb (99.8 kg)    Per PMP,  clonazepam filled on 02/03/2018    Visit Diagnosis:    ICD-10-CM   1. 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 I have reviewed the history. No updates at this time.     Past Medical History:  Past Medical History:  Diagnosis Date  . Asthma   . Bipolar disorder ([place])   . Colon polyp   . Depression     Bipolar  . Gastric ulcer    around 2013.  stress related  . GERD (gastroesophageal reflux disease)   . Hyperlipidemia   . Hypothyroid     Past Surgical History:  Procedure Laterality Date  . ABLATION ON ENDOMETRIOSIS    . COLONOSCOPY     Per patient, done around 2013 in [place], had polyp and overdue for follow up.  . OVARIAN CYST SURGERY Left   . TEMPOROMANDIBULAR JOINT SURGERY     9 surgeries  . TUBAL LIGATION      Family Psychiatric History: Please see initial evaluation for full details. I have reviewed the history. No updates at this time.     Family History:  Family History  Problem Relation Age of Onset  . Anxiety disorder Mother   . Hypertension Mother   . Heart failure Mother   . Stroke Mother   . Hyperlipidemia Father   . Diabetes Father   . Stroke Father   . Hypertension Father   . Colon polyps Father        older than 56  . Diabetes Sister   . Hypertension Sister   . Uterine cancer Sister   . Ovarian cancer Maternal Grandmother   . Colon cancer Maternal Grandmother   . Renal Disease Paternal Grandmother   . Bipolar disorder  Other     Social History:  Social History   Socioeconomic History  . Marital status: Married    Spouse name: Not on file  . Number of children: Not on file  . Years of education: Not on file  . Highest education level: Not on file  Occupational History  . Not on file  Social Needs  . Financial resource strain: Not on file  . Food insecurity:    Worry: Not on file    Inability: Not on file  . Transportation needs:    Medical: Not on file    Non-medical: Not on file  Tobacco Use  . Smoking status: Former Smoker    Types: Cigarettes    Last attempt to quit: 05/07/2012    Years since quitting: 5.9  . Smokeless tobacco: Never Used  Substance and Sexual Activity  . Alcohol use: Never    Frequency: Never  . Drug use: Not Currently  . Sexual activity: Not Currently    Birth control/protection: Surgical    Comment:  tubal  Lifestyle  . Physical activity:    Days per week: Not on file    Minutes per session: Not on file  . Stress: Not on file  Relationships  . Social connections:    Talks on phone: Not on file    Gets together: Not on file    Attends religious service: Not on file    Active member of club or organization: Not on file    Attends meetings of clubs or organizations: Not on file    Relationship status: Not on file  Other Topics Concern  . Not on file  Social History Narrative  . Not on file    Allergies:  Allergies  Allergen Reactions  . Lithium Other (See Comments)    Psoriasis     Metabolic Disorder Labs: No results found for: HGBA1C, MPG No results found for: PROLACTIN Lab Results  Component Value Date   CHOL 268 (H) 01/15/2018   TRIG 218 (H) 01/15/2018   HDL 56 01/15/2018   CHOLHDL 4.8 (H) 01/15/2018   LDLCALC 168 (H) 01/15/2018   Lab Results  Component Value Date   TSH 2.470 01/15/2018    Therapeutic Level Labs: No results found for: LITHIUM No results found for: VALPROATE No components found for:  CBMZ  Current Medications: Current Outpatient Medications  Medication Sig Dispense Refill  . albuterol (PROVENTIL HFA;VENTOLIN HFA) 108 (90 Base) MCG/ACT inhaler Inhale 1 puff into the lungs every 6 (six) hours as needed for wheezing or shortness of breath.    Marland Kitchen CALCIUM CITRATE-VITAMIN D3 PO Take 1 tablet by mouth daily.    . Cariprazine HCl (VRAYLAR) 6 MG CAPS Take 1 capsule (6 mg total) by mouth daily. 30 capsule 1  . Cholecalciferol (VITAMIN D3) 5000 units TABS Take 1 tablet by mouth daily.    . clobetasol cream (TEMOVATE) 5.63 % Apply 1 application topically as needed.    . clonazePAM (KLONOPIN) 1 MG tablet Take 1 mg by mouth 2 (two) times daily as needed.  2  . Dexlansoprazole (DEXILANT) 30 MG capsule Take 30 mg by mouth daily.    . famotidine (PEPCID) 20 MG tablet Take 20 mg by mouth daily.    . fluticasone (FLOVENT HFA) 220 MCG/ACT inhaler Inhale 1  puff into the lungs 2 (two) times daily.    Marland Kitchen lamoTRIgine (LAMICTAL) 100 MG tablet Take 3 tablets (300 mg total) by mouth at bedtime. 90 tablet 1  . levothyroxine (  SYNTHROID, LEVOTHROID) 50 MCG tablet Take 50 mcg by mouth daily before breakfast.    . Multiple Vitamins-Minerals (MULTIVITAMIN WOMEN PO) Take 1 tablet by mouth daily.    . Na Sulfate-K Sulfate-Mg Sulf 17.5-3.13-1.6 GM/177ML SOLN Take 1 kit by mouth as directed. 1 Bottle 0  . naproxen (NAPROSYN) 250 MG tablet Take 250 mg by mouth 2 (two) times daily with a meal.    . sucralfate (CARAFATE) 1 g tablet Take 1 g by mouth as needed.    . venlafaxine XR (EFFEXOR-XR) 150 MG 24 hr capsule Take 2 capsules (300 mg total) by mouth daily. 60 capsule 1  . QUEtiapine (SEROQUEL) 300 MG tablet Take 1 tablet (300 mg total) by mouth at bedtime. 30 tablet 1   No current facility-administered medications for this visit.      Musculoskeletal: Strength & Muscle Tone: within normal limits Gait & Station: normal Patient leans: N/A  Psychiatric Specialty Exam: ROS  Blood pressure (!) 160/92, pulse 98, height '5\' 6"'$  (1.676 m), weight 226 lb (102.5 kg), last menstrual period 12/13/2017, SpO2 98 %.Body mass index is 36.48 kg/m.  General Appearance: Fairly Groomed  Eye Contact:  Good  Speech:  Clear and Coherent  Volume:  Normal  Mood:  Depressed  Affect:  Congruent, Labile and Tearful  Thought Process:  Coherent  Orientation:  Full (Time, Place, and Person)  Thought Content: Logical   Suicidal Thoughts:  No  Homicidal Thoughts:  No  Memory:  Immediate;   Good  Judgement:  Fair  Insight:  Shallow  Psychomotor Activity:  Normal  Concentration:  Concentration: Good and Attention Span: Good  Recall:  Good  Fund of Knowledge: Good  Language: Good  Akathisia:  No  Handed:  Right  AIMS (if indicated): not done  Assets:  Communication Skills Desire for Improvement  ADL's:  Intact  Cognition: WNL  Sleep:  hypersomnia   Screenings: PHQ2-9      Office Visit from 03/04/2018 in [facility] Procedure visit from 02/06/2018 in [facility] Office Visit from 01/15/2018 in [facility]  PHQ-2 Total Score  '2  2  4  '$ PHQ-9 Total Score  '8  15  15       '$ Assessment and Plan:  Rebecca Moreno is a 52 y.o. year old female with a history of bipolar disorder, hypothyroidism, who presents for follow up appointment for Mood disorder in conditions classified elsewhere  # Unspecified mood disorder # Bipolar I disorder by history # r/o MDD with psychotic features Exam is notable for restricted affect, and emotional lability.  Although she reports depressive symptoms, she is not interested in any change in her medication. Patient has marital conflict, and she has very low self esteem, which attributes to her mood symptoms.  Will continue Effexor to target depression.  Will continue lamotrigine to target mood dysregulation.  Discussed risk of Stevens-Johnson syndrome.  Will continue Vraylar and quetiapine at this time to target mood dysregulation.  Although it is preferable to be on one antipsychotics, will continue current medication regimen given patient reports limited benefit from monotherapy with one antipsychotics. Discussed metabolic side effect and EPS.  Will continue clonazepam for anxiety.  Discussed risk of dependence and oversedation.  Noted that although the patient was diagnosed with bipolar disorder, the patient and her husband denies any manic/hypomanic episode except she had mild irritability, paranoia when she was depressed. Will continue to monitor.   Plan I have reviewed and updated plans as  below 1. Continue Venlafaxine 300 mg daily  2. Continue Lamotrigine 300 mg daily  3. Continue Vraylar 6 mg  4. Continue Quetiapine 300 mg at night  5. Continue Clonazepam 1 mg twice a day (she declined refill) 6. Referral to therapy in [facility]  7. Return to clinic in one month for 30 mins  8. Obtain  record from your previous psychiatrist  Past trials of medication: sertra line, fluoxetine, lexapro, Celexa, duloxetine, Wellbutrin  lithium (psoriasis), Depakote (did not work), olanzapine, Abilify (weight gain), latuda, Geodon   The patient demonstrates the following risk factors for suicide: Chronic risk factors for suicide include: psychiatric disorder of depression. Acute risk factors for suicide include: family or marital conflict and unemployment. Protective factors for this patient include: positive social support and hope for the future. Considering these factors, the overall suicide risk at this point appears to be low. Patient is appropriate for outpatient follow up.  The duration of this appointment visit was 30 minutes of face-to-face time with the patient.  Greater than 50% of this time was spent in counseling, explanation of  diagnosis, planning of further management, and coordination of care.  Norman Clay, MD 04/14/2018, 2:12 PM

## 2018-04-14 ENCOUNTER — Ambulatory Visit (INDEPENDENT_AMBULATORY_CARE_PROVIDER_SITE_OTHER): Payer: Medicare Other | Admitting: Psychiatry

## 2018-04-14 ENCOUNTER — Encounter (HOSPITAL_COMMUNITY): Payer: Self-pay | Admitting: Psychiatry

## 2018-04-14 VITALS — BP 160/92 | HR 98 | Ht 66.0 in | Wt 226.0 lb

## 2018-04-14 DIAGNOSIS — F0630 Mood disorder due to known physiological condition, unspecified: Secondary | ICD-10-CM | POA: Insufficient documentation

## 2018-04-14 MED ORDER — CARIPRAZINE HCL 6 MG PO CAPS: 1.0000 | ORAL_CAPSULE | Freq: Every day | ORAL | 1 refills | Status: DC

## 2018-04-14 MED ORDER — LAMOTRIGINE 100 MG PO TABS: 300.0000 mg | ORAL_TABLET | Freq: Every day | ORAL | 1 refills | Status: DC

## 2018-04-14 MED ORDER — VENLAFAXINE HCL ER 150 MG PO CP24: 300.0000 mg | ORAL_CAPSULE | Freq: Every day | ORAL | 1 refills | Status: DC

## 2018-04-14 MED ORDER — QUETIAPINE FUMARATE 300 MG PO TABS: 300.0000 mg | ORAL_TABLET | Freq: Every day | ORAL | 1 refills | Status: DC

## 2018-04-14 NOTE — Patient Instructions (Addendum)
1. Continue Venlafaxine 300 mg daily  2. Continue Lamotrigine 300 mg daily  3. Continue Vraylar 6 mg  4. Continue Quetiapine 300 mg at night  5. Continue Clonazepam 1 mg twice a day 6. Contact for therapy in [facility]  7. Return to clinic in two months for 30 mins

## 2018-04-16 ENCOUNTER — Encounter: Payer: Self-pay | Admitting: Family Medicine

## 2018-04-16 ENCOUNTER — Ambulatory Visit (INDEPENDENT_AMBULATORY_CARE_PROVIDER_SITE_OTHER): Payer: Medicare Other | Admitting: Family Medicine

## 2018-04-16 VITALS — BP 130/87 | HR 98 | Temp 97.4°F | Ht 66.0 in | Wt 227.0 lb

## 2018-04-16 DIAGNOSIS — E039 Hypothyroidism, unspecified: Secondary | ICD-10-CM

## 2018-04-16 DIAGNOSIS — R740 Nonspecific elevation of levels of transaminase and lactic acid dehydrogenase [LDH]: Secondary | ICD-10-CM | POA: Diagnosis not present

## 2018-04-16 DIAGNOSIS — Z789 Other specified health status: Secondary | ICD-10-CM | POA: Diagnosis not present

## 2018-04-16 DIAGNOSIS — Z86010 Personal history of colonic polyps: Secondary | ICD-10-CM | POA: Diagnosis not present

## 2018-04-16 DIAGNOSIS — K219 Gastro-esophageal reflux disease without esophagitis: Secondary | ICD-10-CM | POA: Diagnosis not present

## 2018-04-16 MED ORDER — PHENTERMINE HCL 37.5 MG PO TABS: 18.7500 mg | ORAL_TABLET | Freq: Every day | ORAL | 0 refills | Status: DC

## 2018-04-16 NOTE — Progress Notes (Signed)
Subjective: CC: Hypothyroidism PCP: [name], DO ZPH:XTAVW Rebecca Moreno is a 52 y.o. female presenting to clinic today for:  1.  Hypothyroidism History: 7-year history of hypothyroidism. Patient reports compliance with Synthroid 50 mcg daily.  She does report some weight gain.  She notes both heat and cold intolerance.  No change in voice, difficulty swallowing, diarrhea or constipation.  No nausea or vomiting.  2.  Obesity Patient with longstanding history of obesity.  She notes that her highest weight is current weight of 227.  Her average weight her 20s was around 125.  Goal weight is 125.  She is just started a low-carb diet and exercise program with her daughter.  She has been exercising daily.  ROS: Per HPI  Allergies  Allergen Reactions  . Lithium Other (See Comments)    Psoriasis    Past Medical History:  Diagnosis Date  . Asthma   . Bipolar disorder ([place])   . Colon polyp   . Depression    Bipolar  . Gastric ulcer    around 2013.  stress related  . GERD (gastroesophageal reflux disease)   . Hyperlipidemia   . Hypothyroid     Current Outpatient Medications:  .  albuterol (PROVENTIL HFA;VENTOLIN HFA) 108 (90 Base) MCG/ACT inhaler, Inhale 1 puff into the lungs every 6 (six) hours as needed for wheezing or shortness of breath., Disp: , Rfl:  .  CALCIUM CITRATE-VITAMIN D3 PO, Take 1 tablet by mouth daily., Disp: , Rfl:  .  Cariprazine HCl (VRAYLAR) 6 MG CAPS, Take 1 capsule (6 mg total) by mouth daily., Disp: 30 capsule, Rfl: 1 .  Cholecalciferol (VITAMIN D3) 5000 units TABS, Take 1 tablet by mouth daily., Disp: , Rfl:  .  clobetasol cream (TEMOVATE) 9.79 %, Apply 1 application topically as needed., Disp: , Rfl:  .  clonazePAM (KLONOPIN) 1 MG tablet, Take 1 mg by mouth 2 (two) times daily as needed., Disp: , Rfl: 2 .  Dexlansoprazole (DEXILANT) 30 MG capsule, Take 30 mg by mouth daily., Disp: , Rfl:  .  famotidine (PEPCID) 20 MG tablet, Take 20 mg by mouth  daily., Disp: , Rfl:  .  fluticasone (FLOVENT HFA) 220 MCG/ACT inhaler, Inhale 1 puff into the lungs 2 (two) times daily., Disp: , Rfl:  .  lamoTRIgine (LAMICTAL) 100 MG tablet, Take 3 tablets (300 mg total) by mouth at bedtime., Disp: 90 tablet, Rfl: 1 .  levothyroxine (SYNTHROID, LEVOTHROID) 50 MCG tablet, Take 50 mcg by mouth daily before breakfast., Disp: , Rfl:  .  Multiple Vitamins-Minerals (MULTIVITAMIN WOMEN PO), Take 1 tablet by mouth daily., Disp: , Rfl:  .  Na Sulfate-K Sulfate-Mg Sulf 17.5-3.13-1.6 GM/177ML SOLN, Take 1 kit by mouth as directed., Disp: 1 Bottle, Rfl: 0 .  naproxen (NAPROSYN) 250 MG tablet, Take 250 mg by mouth 2 (two) times daily with a meal., Disp: , Rfl:  .  QUEtiapine (SEROQUEL) 300 MG tablet, Take 1 tablet (300 mg total) by mouth at bedtime., Disp: 30 tablet, Rfl: 1 .  sucralfate (CARAFATE) 1 g tablet, Take 1 g by mouth as needed., Disp: , Rfl:  .  venlafaxine XR (EFFEXOR-XR) 150 MG 24 hr capsule, Take 2 capsules (300 mg total) by mouth daily., Disp: 60 capsule, Rfl: 1 Social History   Socioeconomic History  . Marital status: Married    Spouse name: Not on file  . Number of children: Not on file  . Years of education: Not on file  . Highest education  level: Not on file  Occupational History  . Not on file  Social Needs  . Financial resource strain: Not on file  . Food insecurity:    Worry: Not on file    Inability: Not on file  . Transportation needs:    Medical: Not on file    Non-medical: Not on file  Tobacco Use  . Smoking status: Former Smoker    Types: Cigarettes    Last attempt to quit: 05/07/2012    Years since quitting: 5.9  . Smokeless tobacco: Never Used  Substance and Sexual Activity  . Alcohol use: Never    Frequency: Never  . Drug use: Not Currently  . Sexual activity: Not Currently    Birth control/protection: Surgical    Comment: tubal  Lifestyle  . Physical activity:    Days per week: Not on file    Minutes per session: Not on  file  . Stress: Not on file  Relationships  . Social connections:    Talks on phone: Not on file    Gets together: Not on file    Attends religious service: Not on file    Active member of club or organization: Not on file    Attends meetings of clubs or organizations: Not on file    Relationship status: Not on file  . Intimate partner violence:    Fear of current or ex partner: Not on file    Emotionally abused: Not on file    Physically abused: Not on file    Forced sexual activity: Not on file  Other Topics Concern  . Not on file  Social History Narrative  . Not on file   Family History  Problem Relation Age of Onset  . Anxiety disorder Mother   . Hypertension Mother   . Heart failure Mother   . Stroke Mother   . Hyperlipidemia Father   . Diabetes Father   . Stroke Father   . Hypertension Father   . Colon polyps Father        older than 23  . Diabetes Sister   . Hypertension Sister   . Uterine cancer Sister   . Ovarian cancer Maternal Grandmother   . Colon cancer Maternal Grandmother   . Renal Disease Paternal Grandmother   . Bipolar disorder Other     Objective: Office vital signs reviewed. BP 130/87   Pulse 98   Temp (!) 97.4 F (36.3 C) (Oral)   Ht '5\' 6"'$  (1.676 m)   Wt 227 lb (103 kg)   LMP 12/13/2017 (Approximate)   BMI 36.64 kg/m   Physical Examination:  General: Awake, alert, obese, No acute distress HEENT: Normal    Neck: No masses palpated. No lymphadenopathy; thyroid fulfilling.  No palpable nodules.    Eyes: PERRLA, extraocular membranes intact, sclera white.  No exophthalmos Cardio: regular rate and rhythm, S1S2 heard, no murmurs appreciated Pulm: clear to auscultation bilaterally, no wheezes, rhonchi or rales; normal work of breathing on room air GI: soft, non-tender, non-distended, bowel sounds present x4, no hepatomegaly, no splenomegaly, no masses Neuro: No resting tremor noted.  Assessment/ Plan: 52 y.o. female   1. Acquired  hypothyroidism Check thyroid function since she is experience weight gain.  It appears that she has had a 16 pound weight gain since September.  No evidence of fluid overload.  We plan to start phentermine as below if thyroid medicine does need adjustment. - Thyroid Panel With TSH  2. Morbid obesity ([facility]CC) Goals 100 pound  weight loss.  We did briefly discussed consideration for surgical intervention but she declined this today.  We will adjust thyroid medicines as above if needed.  She is also on a couple of mental health medications that may also be contributing to obesity.  Phentermine prescribed.  We discussed use.  We will plan for no more than 2 months total of medication to kick start her weight loss journey.  We also briefly discussed consideration for referral to [facility].  She would like to hold off on this for now.   Orders Placed This Encounter  Procedures  . Thyroid Panel With TSH   Meds ordered this encounter  Medications  . phentermine (ADIPEX-P) 37.5 MG tablet    Sig: Take 0.5-1 tablets (18.75-37.5 mg total) by mouth daily before breakfast.    Dispense:  30 tablet    Refill:  0     Ashly Windell Moulding, DO [place] [phone]

## 2018-04-16 NOTE — Patient Instructions (Signed)
We discussed the adverse side effects of phentermine use.  I am checking your thyroid levels to see if this is contributing to your difficulty losing weight.  I would like you to hold off on starting the phentermine until the result has come back.  When you start phentermine, start with 1/2 tablet daily.  We discussed that this may cause dry mouth, constipation, racing heart and increased anxiety.  If you develop any worrisome symptoms, discontinue medication immediately.  Follow-up in 1 month with me for recheck.  Phentermine tablets or capsules What is this medicine? PHENTERMINE (FEN ter meen) decreases your appetite. It is used with a reduced calorie diet and exercise to help you lose weight. This medicine may be used for other purposes; ask your health care provider or pharmacist if you have questions. COMMON BRAND NAME(S): Adipex-P, Atti-Plex P, Atti-Plex P Spansule, Fastin, Lomaira, Pro-Fast, Tara-8 What should I tell my health care provider before I take this medicine? They need to know if you have any of these conditions: -agitation -glaucoma -heart disease -high blood pressure -history of substance abuse -lung disease called Primary Pulmonary Hypertension (PPH) -taken an MAOI like Carbex, Eldepryl, Marplan, Nardil, or Parnate in last 14 days -thyroid disease -an unusual or allergic reaction to phentermine, other medicines, foods, dyes, or preservatives -pregnant or trying to get pregnant -breast-feeding How should I use this medicine? Take this medicine by mouth with a glass of water. Follow the directions on the prescription label. The instructions for use may differ based on the product and dose you are taking. Avoid taking this medicine in the evening. It may interfere with sleep. Take your doses at regular intervals. Do not take your medicine more often than directed. Talk to your pediatrician regarding the use of this medicine in children. While this drug may be prescribed for  children 17 years or older for selected conditions, precautions do apply. Overdosage: If you think you have taken too much of this medicine contact a poison control center or emergency room at once. NOTE: This medicine is only for you. Do not share this medicine with others. What if I miss a dose? If you miss a dose, take it as soon as you can. If it is almost time for your next dose, take only that dose. Do not take double or extra doses. What may interact with this medicine? Do not take this medicine with any of the following medications: -duloxetine -MAOIs like Carbex, Eldepryl, Marplan, Nardil, and Parnate -medicines for colds or breathing difficulties like pseudoephedrine or phenylephrine -procarbazine -sibutramine -SSRIs like citalopram, escitalopram, fluoxetine, fluvoxamine, paroxetine, and sertraline -stimulants like dexmethylphenidate, methylphenidate or modafinil -venlafaxine This medicine may also interact with the following medications: -medicines for diabetes This list may not describe all possible interactions. Give your health care provider a list of all the medicines, herbs, non-prescription drugs, or dietary supplements you use. Also tell them if you smoke, drink alcohol, or use illegal drugs. Some items may interact with your medicine. What should I watch for while using this medicine? Notify your physician immediately if you become short of breath while doing your normal activities. Do not take this medicine within 6 hours of bedtime. It can keep you from getting to sleep. Avoid drinks that contain caffeine and try to stick to a regular bedtime every night. This medicine was intended to be used in addition to a healthy diet and exercise. The best results are achieved this way. This medicine is only indicated for short-term use. Eventually  your weight loss may level out. At that point, the drug will only help you maintain your new weight. Do not increase or in any way change  your dose without consulting your doctor. You may get drowsy or dizzy. Do not drive, use machinery, or do anything that needs mental alertness until you know how this medicine affects you. Do not stand or sit up quickly, especially if you are an older patient. This reduces the risk of dizzy or fainting spells. Alcohol may increase dizziness and drowsiness. Avoid alcoholic drinks. What side effects may I notice from receiving this medicine? Side effects that you should report to your doctor or health care professional as soon as possible: -chest pain, palpitations -depression or severe changes in mood -increased blood pressure -irritability -nervousness or restlessness -severe dizziness -shortness of breath -problems urinating -unusual swelling of the legs -vomiting Side effects that usually do not require medical attention (report to your doctor or health care professional if they continue or are bothersome): -blurred vision or other eye problems -changes in sexual ability or desire -constipation or diarrhea -difficulty sleeping -dry mouth or unpleasant taste -headache -nausea This list may not describe all possible side effects. Call your doctor for medical advice about side effects. You may report side effects to FDA at 1-800-FDA-1088. Where should I keep my medicine? Keep out of the reach of children. This medicine can be abused. Keep your medicine in a safe place to protect it from theft. Do not share this medicine with anyone. Selling or giving away this medicine is dangerous and against the law. This medicine may cause accidental overdose and death if taken by other adults, children, or pets. Mix any unused medicine with a substance like cat litter or coffee grounds. Then throw the medicine away in a sealed container like a sealed bag or a coffee can with a lid. Do not use the medicine after the expiration date. Store at room temperature between 20 and 25 degrees C (68 and 77 degrees  F). Keep container tightly closed. NOTE: This sheet is a summary. It may not cover all possible information. If you have questions about this medicine, talk to your doctor, pharmacist, or health care provider.  2018 [employer]/Gold Standard (2015-01-28 12:53:15) Liraglutide injection (Weight Management) What is this medicine? LIRAGLUTIDE (LIR a GLOO tide) is used with a reduced calorie diet and exercise to help you lose weight. This medicine may be used for other purposes; ask your health care provider or pharmacist if you have questions. COMMON BRAND NAME(S): Saxenda What should I tell my health care provider before I take this medicine? They need to know if you have any of these conditions: -endocrine tumors (MEN 2) or if someone in your family had these tumors -gallbladder disease -high cholesterol -history of alcohol abuse problem -history of pancreatitis -kidney disease or if you are on dialysis -liver disease -previous swelling of the tongue, face, or lips with difficulty breathing, difficulty swallowing, hoarseness, or tightening of the throat -stomach problems -suicidal thoughts, plans, or attempt; a previous suicide attempt by you or a family member -thyroid cancer or if someone in your family had thyroid cancer -an unusual or allergic reaction to liraglutide, other medicines, foods, dyes, or preservatives -pregnant or trying to get pregnant -breast-feeding How should I use this medicine? This medicine is for injection under the skin of your upper leg, stomach area, or upper arm. You will be taught how to prepare and give this medicine. Use exactly as directed. Take your  medicine at regular intervals. Do not take it more often than directed. It is important that you put your used needles and syringes in a special sharps container. Do not put them in a trash can. If you do not have a sharps container, call your pharmacist or healthcare provider to get one. A special MedGuide will be  given to you by the pharmacist with each prescription and refill. Be sure to read this information carefully each time. Talk to your pediatrician regarding the use of this medicine in children. Special care may be needed. Overdosage: If you think you have taken too much of this medicine contact a poison control center or emergency room at once. NOTE: This medicine is only for you. Do not share this medicine with others. What if I miss a dose? If you miss a dose, take it as soon as you can. If it is almost time for your next dose, take only that dose. Do not take double or extra doses. If you miss your dose for 3 days or more, call your doctor or health care professional to talk about how to restart this medicine. What may interact with this medicine? -insulin and other medicines for diabetes This list may not describe all possible interactions. Give your health care provider a list of all the medicines, herbs, non-prescription drugs, or dietary supplements you use. Also tell them if you smoke, drink alcohol, or use illegal drugs. Some items may interact with your medicine. What should I watch for while using this medicine? Visit your doctor or health care professional for regular checks on your progress. This medicine is intended to be used in addition to a healthy diet and appropriate exercise. The best results are achieved this way. Do not increase or in any way change your dose without consulting your doctor or health care professional. Drink plenty of fluids while taking this medicine. Check with your doctor or health care professional if you get an attack of severe diarrhea, nausea, and vomiting. The loss of too much body fluid can make it dangerous for you to take this medicine. This medicine may affect blood sugar levels. If you have diabetes, check with your doctor or health care professional before you change your diet or the dose of your diabetic medicine. Patients and their families should  watch out for worsening depression or thoughts of suicide. Also watch out for sudden changes in feelings such as feeling anxious, agitated, panicky, irritable, hostile, aggressive, impulsive, severely restless, overly excited and hyperactive, or not being able to sleep. If this happens, especially at the beginning of treatment or after a change in dose, call your health care professional. What side effects may I notice from receiving this medicine? Side effects that you should report to your doctor or health care professional as soon as possible: -allergic reactions like skin rash, itching or hives, swelling of the face, lips, or tongue -breathing problems -diarrhea that continues or is severe -lump or swelling on the neck -severe nausea -signs and symptoms of infection like fever or chills; cough; sore throat; pain or trouble passing urine -signs and symptoms of low blood sugar such as feeling anxious, confusion, dizziness, increased hunger, unusually weak or tired, sweating, shakiness, cold, irritable, headache, blurred vision, fast heartbeat, loss of consciousness -signs and symptoms of kidney injury like trouble passing urine or change in the amount of urine -trouble swallowing -unusual stomach upset or pain -vomiting Side effects that usually do not require medical attention (report to your doctor  or health care professional if they continue or are bothersome): -constipation -decreased appetite -diarrhea -fatigue -headache -nausea -pain, redness, or irritation at site where injected -stomach upset -stuffy or runny nose This list may not describe all possible side effects. Call your doctor for medical advice about side effects. You may report side effects to FDA at 1-800-FDA-1088. Where should I keep my medicine? Keep out of the reach of children. Store unopened pen in a refrigerator between 2 and 8 degrees C (36 and 46 degrees F). Do not freeze or use if the medicine has been frozen.  Protect from light and excessive heat. After you first use the pen, it can be stored at room temperature between 15 and 30 degrees C (59 and 86 degrees F) or in a refrigerator. Throw away your used pen after 30 days or after the expiration date, whichever comes first. Do not store your pen with the needle attached. If the needle is left on, medicine may leak from the pen. NOTE: This sheet is a summary. It may not cover all possible information. If you have questions about this medicine, talk to your doctor, pharmacist, or health care provider.  2018 [employer]/Gold Standard (2016-05-10 14:41:37)

## 2018-04-17 LAB — HEPATIC FUNCTION PANEL
ALT: 62 IU/L — ABNORMAL HIGH (ref 0–32)
AST: 46 IU/L — ABNORMAL HIGH (ref 0–40)
Albumin: 4.2 g/dL (ref 3.5–5.5)
Alkaline Phosphatase: 99 IU/L (ref 39–117)
Bilirubin Total: 0.2 mg/dL (ref 0.0–1.2)
Bilirubin, Direct: 0.09 mg/dL (ref 0.00–0.40)
Total Protein: 6.3 g/dL (ref 6.0–8.5)

## 2018-04-17 LAB — THYROID PANEL WITH TSH
Free Thyroxine Index: 1.3 (ref 1.2–4.9)
T3 Uptake Ratio: 23 % — ABNORMAL LOW (ref 24–39)
T4, Total: 5.8 ug/dL (ref 4.5–12.0)
TSH: 1.55 u[IU]/mL (ref 0.450–4.500)

## 2018-04-17 LAB — HEPATITIS B SURFACE ANTIGEN: Hepatitis B Surface Ag: NEGATIVE

## 2018-04-17 LAB — IRON,TIBC AND FERRITIN PANEL
Ferritin: 72 ng/mL (ref 15–150)
Iron Saturation: 24 % (ref 15–55)
Iron: 71 ug/dL (ref 27–159)
Total Iron Binding Capacity: 294 ug/dL (ref 250–450)
UIBC: 223 ug/dL (ref 131–425)

## 2018-04-17 LAB — HEPATITIS C ANTIBODY: Hep C Virus Ab: <0.1 s/co ratio (ref 0.0–0.9)

## 2018-04-23 ENCOUNTER — Other Ambulatory Visit: Payer: Self-pay | Admitting: *Deleted

## 2018-04-23 DIAGNOSIS — R945 Abnormal results of liver function studies: Principal | ICD-10-CM

## 2018-04-23 DIAGNOSIS — R7989 Other specified abnormal findings of blood chemistry: Secondary | ICD-10-CM

## 2018-04-25 NOTE — Patient Instructions (Signed)
Rebecca Moreno  04/25/2018     @PREFPERIOPPHARMACY @   Your procedure is scheduled on  05/12/2018 .  Report to [facility] at  1030  A.M.  Call this number if you have problems the morning of surgery:  [phone]   Remember:  Follow the diet and prep instructions given to you by Dr Roseanne Kaufman office.                       Take these medicines the morning of surgery with A SIP OF WATER  Cariprazine, clonazepam, dexilant, pepcid, levothyroxine, seroquel. Use your inhalers before you come.    Do not wear jewelry, make-up or nail polish.  Do not wear lotions, powders, or perfumes, or deodorant.  Do not shave 48 hours prior to surgery.  Men may shave face and neck.  Do not bring valuables to the hospital.  [facility] is not responsible for any belongings or valuables.  Contacts, dentures or bridgework may not be worn into surgery.  Leave your suitcase in the car.  After surgery it may be brought to your room.  For patients admitted to the hospital, discharge time will be determined by your treatment team.  Patients discharged the day of surgery will not be allowed to drive home.   Name and phone number of your driver:   family Special instructions:  STOP YOUR PHENTERMINE until after your procedure.  Please read over the following fact sheets that you were given. Anesthesia Post-op Instructions and Care and Recovery After Surgery       Colonoscopy, Adult A colonoscopy is an exam to look at the large intestine. It is done to check for problems, such as:  Lumps (tumors).  Growths (polyps).  Swelling (inflammation).  Bleeding. What happens before the procedure? Eating and drinking Follow instructions from your doctor about eating and drinking. These instructions may include:  A few days before the procedure - follow a low-fiber diet. ? Avoid nuts. ? Avoid seeds. ? Avoid dried fruit. ? Avoid raw fruits. ? Avoid vegetables.  1-3 days before the  procedure - follow a clear liquid diet. Avoid liquids that have red or purple dye. Drink only clear liquids, such as: ? Clear broth or bouillon. ? Black coffee or tea. ? Clear juice. ? Clear soft drinks or sports drinks. ? Gelatin dessert. ? Popsicles.  On the day of the procedure - do not eat or drink anything during the 2 hours before the procedure. Up to 2 hours before the procedure, you may continue to drink clear liquids, such as water or clear fruit juice.  Bowel prep If you were prescribed an oral bowel prep:  Take it as told by your doctor. Starting the day before your procedure, you will need to drink a lot of liquid. The liquid will cause you to poop (have bowel movements) until your poop is almost clear or light green.  To clean out your colon, you may also be given: ? Laxative medicines. ? Instructions about how to use an enema.  If your skin or butt gets irritated from diarrhea, you may: ? Wipe the area with wipes that have medicine in them, such as adult wet wipes with aloe and vitamin E. ? Put something on your skin that soothes the area, such as petroleum jelly.  If you throw up (vomit) while drinking the bowel prep, take a break for up to 60 minutes. Then  begin the bowel prep again. If you keep throwing up and you cannot take the bowel prep without throwing up, call your doctor. General instructions  Ask your doctor about: ? Changing or stopping your normal medicines. This is important if you take iron pills, diabetes medicines, or blood thinners. ? Taking medicines such as aspirin and ibuprofen. These medicines can thin your blood. Do not take these medicines unless your doctor tells you to take them.  Plan to have someone take you home from the hospital or clinic. What happens during the procedure?   An IV tube may be put into one of your veins.  You will be given medicine to help you relax (sedative).  To reduce your risk of infection: ? Your doctors will  wash their hands. ? Your anal area will be washed with soap.  You will be asked to lie on your side with your knees bent.  Your doctor will get a long, thin, flexible tube ready. The tube will have a camera and a light on the end.  The tube will be put into your anus.  The tube will be gently put into your large intestine.  Air will be delivered into your large intestine to keep it open. You may feel some pressure or cramping.  The camera will be used to take photos.  A small tissue sample may be removed for testing (biopsy).  If small growths are found, your doctor may remove them and have them checked for cancer.  The tube that was put into your anus will be slowly removed. The procedure may vary among doctors and hospitals. What happens after the procedure?  Your doctor will check on you often until the medicines you were given have worn off.  Do not drive for 24 hours after the procedure.  You may have a small amount of blood in your poop.  You may pass gas.  You may have mild cramps or bloating in your belly (abdomen).  It is up to you to get the results of your procedure. Ask your doctor, or the department performing the procedure, when your results will be ready. Summary  A colonoscopy is an exam to look at the large intestine.  Follow instructions from your doctor about eating and drinking before the procedure.  If you were prescribed an oral bowel prep to clean out your colon, take it as told by your doctor.  Your doctor will check on you often until the medicines you were given have worn off.  Plan to have someone take you home from the hospital or clinic. This information is not intended to replace advice given to you by your health care provider. Make sure you discuss any questions you have with your health care provider. Document Released: 05/26/2010 Document Revised: 02/20/2017 Document Reviewed: 07/05/2015 Elsevier Interactive Patient Education  2019  [employer]  Colonoscopy, Adult, Care After This sheet gives you information about how to care for yourself after your procedure. Your health care provider may also give you more specific instructions. If you have problems or questions, contact your health care provider. What can I expect after the procedure? After the procedure, it is common to have:  A small amount of blood in your stool for 24 hours after the procedure.  Some gas.  Mild abdominal cramping or bloating. Follow these instructions at home: General instructions  For the first 24 hours after the procedure: ? Do not drive or use machinery. ? Do not sign important documents. ?  Do not drink alcohol. ? Do your regular daily activities at a slower pace than normal. ? Eat soft, easy-to-digest foods.  Take over-the-counter or prescription medicines only as told by your health care provider. Relieving cramping and bloating   Try walking around when you have cramps or feel bloated.  Apply heat to your abdomen as told by your health care provider. Use a heat source that your health care provider recommends, such as a moist heat pack or a heating pad. ? Place a towel between your skin and the heat source. ? Leave the heat on for 20-30 minutes. ? Remove the heat if your skin turns bright red. This is especially important if you are unable to feel pain, heat, or cold. You may have a greater risk of getting burned. Eating and drinking   Drink enough fluid to keep your urine pale yellow.  Resume your normal diet as instructed by your health care provider. Avoid heavy or fried foods that are hard to digest.  Avoid drinking alcohol for as long as instructed by your health care provider. Contact a health care provider if:  You have blood in your stool 2-3 days after the procedure. Get help right away if:  You have more than a small spotting of blood in your stool.  You pass large blood clots in your stool.  Your abdomen  is swollen.  You have nausea or vomiting.  You have a fever.  You have increasing abdominal pain that is not relieved with medicine. Summary  After the procedure, it is common to have a small amount of blood in your stool. You may also have mild abdominal cramping and bloating.  For the first 24 hours after the procedure, do not drive or use machinery, sign important documents, or drink alcohol.  Contact your health care provider if you have a lot of blood in your stool, nausea or vomiting, a fever, or increased abdominal pain. This information is not intended to replace advice given to you by your health care provider. Make sure you discuss any questions you have with your health care provider. Document Released: 12/06/2003 Document Revised: 02/13/2017 Document Reviewed: 07/05/2015 Elsevier Interactive Patient Education  2019 [place] Anesthesia is a term that refers to techniques, procedures, and medicines that help a person stay safe and comfortable during a medical procedure. Monitored anesthesia care, or sedation, is one type of anesthesia. Your anesthesia specialist may recommend sedation if you will be having a procedure that does not require you to be unconscious, such as:  Cataract surgery.  A dental procedure.  A biopsy.  A colonoscopy. During the procedure, you may receive a medicine to help you relax (sedative). There are three levels of sedation:  Mild sedation. At this level, you may feel awake and relaxed. You will be able to follow directions.  Moderate sedation. At this level, you will be sleepy. You may not remember the procedure.  Deep sedation. At this level, you will be asleep. You will not remember the procedure. The more medicine you are given, the deeper your level of sedation will be. Depending on how you respond to the procedure, the anesthesia specialist may change your level of sedation or the type of anesthesia to fit  your needs. An anesthesia specialist will monitor you closely during the procedure. Let your health care provider know about:  Any allergies you have.  All medicines you are taking, including vitamins, herbs, eye drops, creams, and over-the-counter medicines.  Any use of steroids (by mouth or as a cream).  Any problems you or family members have had with sedatives and anesthetic medicines.  Any blood disorders you have.  Any surgeries you have had.  Any medical conditions you have, such as sleep apnea.  Whether you are pregnant or may be pregnant.  Any use of cigarettes, alcohol, or street drugs. What are the risks? Generally, this is a safe procedure. However, problems may occur, including:  Getting too much medicine (oversedation).  Nausea.  Allergic reaction to medicines.  Trouble breathing. If this happens, a breathing tube may be used to help with breathing. It will be removed when you are awake and breathing on your own.  Heart trouble.  Lung trouble. Before the procedure Staying hydrated Follow instructions from your health care provider about hydration, which may include:  Up to 2 hours before the procedure - you may continue to drink clear liquids, such as water, clear fruit juice, black coffee, and plain tea. Eating and drinking restrictions Follow instructions from your health care provider about eating and drinking, which may include:  8 hours before the procedure - stop eating heavy meals or foods such as meat, fried foods, or fatty foods.  6 hours before the procedure - stop eating light meals or foods, such as toast or cereal.  6 hours before the procedure - stop drinking milk or drinks that contain milk.  2 hours before the procedure - stop drinking clear liquids. Medicines Ask your health care provider about:  Changing or stopping your regular medicines. This is especially important if you are taking diabetes medicines or blood thinners.  Taking  medicines such as aspirin and ibuprofen. These medicines can thin your blood. Do not take these medicines before your procedure if your health care provider instructs you not to. Tests and exams  You will have a physical exam.  You may have blood tests done to show: ? How well your kidneys and liver are working. ? How well your blood can clot. General instructions  Plan to have someone take you home from the hospital or clinic.  If you will be going home right after the procedure, plan to have someone with you for 24 hours.  What happens during the procedure?  Your blood pressure, heart rate, breathing, level of pain and overall condition will be monitored.  An IV tube will be inserted into one of your veins.  Your anesthesia specialist will give you medicines as needed to keep you comfortable during the procedure. This may mean changing the level of sedation.  The procedure will be performed. After the procedure  Your blood pressure, heart rate, breathing rate, and blood oxygen level will be monitored until the medicines you were given have worn off.  Do not drive for 24 hours if you received a sedative.  You may: ? Feel sleepy, clumsy, or nauseous. ? Feel forgetful about what happened after the procedure. ? Have a sore throat if you had a breathing tube during the procedure. ? Vomit. This information is not intended to replace advice given to you by your health care provider. Make sure you discuss any questions you have with your health care provider. Document Released: 01/17/2005 Document Revised: 09/30/2015 Document Reviewed: 08/14/2015 Elsevier Interactive Patient Education  2019 [place], Care After These instructions provide you with information about caring for yourself after your procedure. Your health care provider may also give you more specific instructions. Your treatment has been  planned according to current medical practices, but  problems sometimes occur. Call your health care provider if you have any problems or questions after your procedure. What can I expect after the procedure? After your procedure, you may:  Feel sleepy for several hours.  Feel clumsy and have poor balance for several hours.  Feel forgetful about what happened after the procedure.  Have poor judgment for several hours.  Feel nauseous or vomit.  Have a sore throat if you had a breathing tube during the procedure. Follow these instructions at home: For at least 24 hours after the procedure:      Have a responsible adult stay with you. It is important to have someone help care for you until you are awake and alert.  Rest as needed.  Do not: ? Participate in activities in which you could fall or become injured. ? Drive. ? Use heavy machinery. ? Drink alcohol. ? Take sleeping pills or medicines that cause drowsiness. ? Make important decisions or sign legal documents. ? Take care of children on your own. Eating and drinking  Follow the diet that is recommended by your health care provider.  If you vomit, drink water, juice, or soup when you can drink without vomiting.  Make sure you have little or no nausea before eating solid foods. General instructions  Take over-the-counter and prescription medicines only as told by your health care provider.  If you have sleep apnea, surgery and certain medicines can increase your risk for breathing problems. Follow instructions from your health care provider about wearing your sleep device: ? Anytime you are sleeping, including during daytime naps. ? While taking prescription pain medicines, sleeping medicines, or medicines that make you drowsy.  If you smoke, do not smoke without supervision.  Keep all follow-up visits as told by your health care provider. This is important. Contact a health care provider if:  You keep feeling nauseous or you keep vomiting.  You feel  light-headed.  You develop a rash.  You have a fever. Get help right away if:  You have trouble breathing. Summary  For several hours after your procedure, you may feel sleepy and have poor judgment.  Have a responsible adult stay with you for at least 24 hours or until you are awake and alert. This information is not intended to replace advice given to you by your health care provider. Make sure you discuss any questions you have with your health care provider. Document Released: 08/14/2015 Document Revised: 12/07/2016 Document Reviewed: 08/14/2015 Elsevier Interactive Patient Education  2019 [employer].

## 2018-04-29 ENCOUNTER — Ambulatory Visit (HOSPITAL_COMMUNITY)
Admission: RE | Admit: 2018-04-29 | Discharge: 2018-04-29 | Disposition: A | Discharge status: Home / Self Care | Payer: Medicare Other | Source: Ambulatory Visit | Attending: Gastroenterology | Admitting: Gastroenterology

## 2018-04-29 DIAGNOSIS — K7689 Other specified diseases of liver: Secondary | ICD-10-CM | POA: Insufficient documentation

## 2018-04-29 DIAGNOSIS — R7989 Other specified abnormal findings of blood chemistry: Secondary | ICD-10-CM | POA: Diagnosis not present

## 2018-04-29 DIAGNOSIS — N281 Cyst of kidney, acquired: Secondary | ICD-10-CM | POA: Insufficient documentation

## 2018-04-29 DIAGNOSIS — R945 Abnormal results of liver function studies: Secondary | ICD-10-CM

## 2018-05-06 ENCOUNTER — Other Ambulatory Visit: Payer: Self-pay

## 2018-05-06 ENCOUNTER — Encounter (HOSPITAL_COMMUNITY)
Admission: RE | Admit: 2018-05-06 | Discharge: 2018-05-06 | Disposition: A | Discharge status: Home / Self Care | Payer: Medicare Other | Source: Ambulatory Visit | Attending: Internal Medicine | Admitting: Internal Medicine

## 2018-05-06 ENCOUNTER — Encounter (HOSPITAL_COMMUNITY): Payer: Self-pay

## 2018-05-06 DIAGNOSIS — Z01812 Encounter for preprocedural laboratory examination: Secondary | ICD-10-CM | POA: Diagnosis not present

## 2018-05-06 HISTORY — DX: Personal history of urinary calculi: Z87.442

## 2018-05-06 HISTORY — DX: Sleep apnea, unspecified: G47.30

## 2018-05-06 LAB — HCG, SERUM, QUALITATIVE: Preg, Serum: POSITIVE — AB

## 2018-05-06 NOTE — Pre-Procedure Instructions (Signed)
Weak positive pregnancy test routed to Dr Gala Romney and to Neil Crouch.

## 2018-05-12 ENCOUNTER — Other Ambulatory Visit (HOSPITAL_COMMUNITY): Payer: Self-pay | Admitting: Psychiatry

## 2018-05-12 ENCOUNTER — Other Ambulatory Visit: Payer: Self-pay

## 2018-05-12 ENCOUNTER — Encounter (HOSPITAL_COMMUNITY): Payer: Self-pay | Admitting: Anesthesiology

## 2018-05-12 ENCOUNTER — Encounter (HOSPITAL_COMMUNITY)
Admission: RE | Disposition: A | Discharge status: Home / Self Care | Payer: Self-pay | Source: Home / Self Care | Attending: Internal Medicine

## 2018-05-12 ENCOUNTER — Ambulatory Visit (HOSPITAL_COMMUNITY)
Admission: RE | Admit: 2018-05-12 | Discharge: 2018-05-12 | Disposition: A | Discharge status: Home / Self Care | Payer: Medicare Other | Attending: Internal Medicine | Admitting: Internal Medicine

## 2018-05-12 ENCOUNTER — Ambulatory Visit (HOSPITAL_COMMUNITY): Payer: Medicare Other | Admitting: Anesthesiology

## 2018-05-12 DIAGNOSIS — Z87442 Personal history of urinary calculi: Secondary | ICD-10-CM | POA: Insufficient documentation

## 2018-05-12 DIAGNOSIS — Z8719 Personal history of other diseases of the digestive system: Secondary | ICD-10-CM | POA: Insufficient documentation

## 2018-05-12 DIAGNOSIS — Z79890 Hormone replacement therapy (postmenopausal): Secondary | ICD-10-CM | POA: Insufficient documentation

## 2018-05-12 DIAGNOSIS — Z79899 Other long term (current) drug therapy: Secondary | ICD-10-CM | POA: Diagnosis not present

## 2018-05-12 DIAGNOSIS — Z7951 Long term (current) use of inhaled steroids: Secondary | ICD-10-CM | POA: Diagnosis not present

## 2018-05-12 DIAGNOSIS — Z86010 Personal history of colonic polyps: Secondary | ICD-10-CM | POA: Insufficient documentation

## 2018-05-12 DIAGNOSIS — K760 Fatty (change of) liver, not elsewhere classified: Secondary | ICD-10-CM

## 2018-05-12 DIAGNOSIS — G4733 Obstructive sleep apnea (adult) (pediatric): Secondary | ICD-10-CM | POA: Diagnosis not present

## 2018-05-12 DIAGNOSIS — J45909 Unspecified asthma, uncomplicated: Secondary | ICD-10-CM | POA: Insufficient documentation

## 2018-05-12 DIAGNOSIS — F319 Bipolar disorder, unspecified: Secondary | ICD-10-CM | POA: Insufficient documentation

## 2018-05-12 DIAGNOSIS — Z791 Long term (current) use of non-steroidal anti-inflammatories (NSAID): Secondary | ICD-10-CM | POA: Insufficient documentation

## 2018-05-12 DIAGNOSIS — Z1211 Encounter for screening for malignant neoplasm of colon: Secondary | ICD-10-CM | POA: Insufficient documentation

## 2018-05-12 DIAGNOSIS — Z8249 Family history of ischemic heart disease and other diseases of the circulatory system: Secondary | ICD-10-CM | POA: Diagnosis not present

## 2018-05-12 DIAGNOSIS — K219 Gastro-esophageal reflux disease without esophagitis: Secondary | ICD-10-CM | POA: Insufficient documentation

## 2018-05-12 DIAGNOSIS — G4730 Sleep apnea, unspecified: Secondary | ICD-10-CM | POA: Insufficient documentation

## 2018-05-12 DIAGNOSIS — D123 Benign neoplasm of transverse colon: Secondary | ICD-10-CM | POA: Insufficient documentation

## 2018-05-12 DIAGNOSIS — E039 Hypothyroidism, unspecified: Secondary | ICD-10-CM | POA: Diagnosis not present

## 2018-05-12 DIAGNOSIS — Z87891 Personal history of nicotine dependence: Secondary | ICD-10-CM | POA: Diagnosis not present

## 2018-05-12 HISTORY — PX: POLYPECTOMY: SHX5525

## 2018-05-12 HISTORY — PX: COLONOSCOPY WITH PROPOFOL: SHX5780

## 2018-05-12 SURGERY — COLONOSCOPY WITH PROPOFOL
Anesthesia: Monitor Anesthesia Care

## 2018-05-12 MED ORDER — LACTATED RINGERS IV SOLN: INTRAVENOUS | Status: DC

## 2018-05-12 MED ORDER — MEPERIDINE HCL 100 MG/ML IJ SOLN: 6.2500 mg | INTRAMUSCULAR | Status: DC | PRN

## 2018-05-12 MED ORDER — STERILE WATER FOR IRRIGATION IR SOLN
Status: DC | PRN
  Administered 2018-05-12: 100 mL

## 2018-05-12 MED ORDER — CHLORHEXIDINE GLUCONATE CLOTH 2 % EX PADS: 6.0000 | MEDICATED_PAD | Freq: Once | CUTANEOUS | Status: DC

## 2018-05-12 MED ORDER — HYDROCODONE-ACETAMINOPHEN 7.5-325 MG PO TABS: 1.0000 | ORAL_TABLET | Freq: Once | ORAL | Status: DC | PRN

## 2018-05-12 MED ORDER — LACTATED RINGERS IV SOLN
INTRAVENOUS | Status: DC | PRN
  Administered 2018-05-12: 11:00:00 via INTRAVENOUS

## 2018-05-12 MED ORDER — PROMETHAZINE HCL 25 MG/ML IJ SOLN: 6.2500 mg | INTRAMUSCULAR | Status: DC | PRN

## 2018-05-12 MED ORDER — PROPOFOL 500 MG/50ML IV EMUL
INTRAVENOUS | Status: DC | PRN
  Administered 2018-05-12: 150 ug/kg/min via INTRAVENOUS

## 2018-05-12 MED ORDER — HYDROMORPHONE HCL 1 MG/ML IJ SOLN: 0.2500 mg | INTRAMUSCULAR | Status: DC | PRN

## 2018-05-12 MED ORDER — QUETIAPINE FUMARATE 300 MG PO TABS: 300.0000 mg | ORAL_TABLET | Freq: Every day | ORAL | 1 refills | Status: DC

## 2018-05-12 MED ORDER — PROPOFOL 10 MG/ML IV BOLUS
INTRAVENOUS | Status: DC | PRN
  Administered 2018-05-12 (×2): 30 mg via INTRAVENOUS

## 2018-05-12 MED ORDER — PROPOFOL 10 MG/ML IV BOLUS
INTRAVENOUS | Status: AC
  Filled 2018-05-12: qty 40

## 2018-05-12 MED ORDER — GLYCOPYRROLATE PF 0.2 MG/ML IJ SOSY
PREFILLED_SYRINGE | INTRAMUSCULAR | Status: AC
  Filled 2018-05-12: qty 3

## 2018-05-12 MED ORDER — PROPOFOL 10 MG/ML IV BOLUS
INTRAVENOUS | Status: AC
  Filled 2018-05-12: qty 20

## 2018-05-12 NOTE — Anesthesia Preprocedure Evaluation (Signed)
Anesthesia Evaluation    Airway Mallampati: II       Dental  (+) Teeth Intact, Dental Advidsory Given   Pulmonary asthma , sleep apnea , former smoker,    breath sounds clear to auscultation       Cardiovascular  Rhythm:regular     Neuro/Psych PSYCHIATRIC DISORDERS Depression Bipolar Disorder    GI/Hepatic PUD, GERD  ,  Endo/Other  Hypothyroidism   Renal/GU      Musculoskeletal   Abdominal   Peds  Hematology   Anesthesia Other Findings   Reproductive/Obstetrics                             Anesthesia Physical Anesthesia Plan  ASA: III  Anesthesia Plan: MAC   Post-op Pain Management:    Induction:   PONV Risk Score and Plan:   Airway Management Planned:   Additional Equipment:   Intra-op Plan:   Post-operative Plan:   Informed Consent: I have reviewed the patients History and Physical, chart, labs and discussed the procedure including the risks, benefits and alternatives for the proposed anesthesia with the patient or authorized representative who has indicated his/her understanding and acceptance.   Dental Advisory Given  Plan Discussed with: Anesthesiologist  Anesthesia Plan Comments:         Anesthesia Quick Evaluation

## 2018-05-12 NOTE — Discharge Instructions (Signed)
Colonoscopy Discharge Instructions  Read the instructions outlined below and refer to this sheet in the next few weeks. These discharge instructions provide you with general information on caring for yourself after you leave the hospital. Your doctor may also give you specific instructions. While your treatment has been planned according to the most current medical practices available, unavoidable complications occasionally occur. If you have any problems or questions after discharge, call Dr. Gala Romney at [phone]. ACTIVITY  You may resume your regular activity, but move at a slower pace for the next 24 hours.   Take frequent rest periods for the next 24 hours.   Walking will help get rid of the air and reduce the bloated feeling in your belly (abdomen).   No driving for 24 hours (because of the medicine (anesthesia) used during the test).    Do not sign any important legal documents or operate any machinery for 24 hours (because of the anesthesia used during the test).  NUTRITION  Drink plenty of fluids.   You may resume your normal diet as instructed by your doctor.   Begin with a light meal and progress to your normal diet. Heavy or fried foods are harder to digest and may make you feel sick to your stomach (nauseated).   Avoid alcoholic beverages for 24 hours or as instructed.  MEDICATIONS  You may resume your normal medications unless your doctor tells you otherwise.  WHAT YOU CAN EXPECT TODAY  Some feelings of bloating in the abdomen.   Passage of more gas than usual.   Spotting of blood in your stool or on the toilet paper.  IF YOU HAD POLYPS REMOVED DURING THE COLONOSCOPY:  No aspirin products for 7 days or as instructed.   No alcohol for 7 days or as instructed.   Eat a soft diet for the next 24 hours.  FINDING OUT THE RESULTS OF YOUR TEST Not all test results are available during your visit. If your test results are not back during the visit, make an appointment  with your caregiver to find out the results. Do not assume everything is normal if you have not heard from your caregiver or the medical facility. It is important for you to follow up on all of your test results.  SEEK IMMEDIATE MEDICAL ATTENTION IF:  You have more than a spotting of blood in your stool.   Your belly is swollen (abdominal distention).   You are nauseated or vomiting.   You have a temperature over 101.   You have abdominal pain or discomfort that is severe or gets worse throughout the day.    Colon polyp information provided  Further recommendations to follow pending review of pathology report  Check with prescriber regarding the appropriate dose of Seroquel.      Colon Polyps  Polyps are tissue growths inside the body. Polyps can grow in many places, including the large intestine (colon). A polyp may be a round bump or a mushroom-shaped growth. You could have one polyp or several. Most colon polyps are noncancerous (benign). However, some colon polyps can become cancerous over time. Finding and removing the polyps early can help prevent this. What are the causes? The exact cause of colon polyps is not known. What increases the risk? You are more likely to develop this condition if you:  Have a family history of colon cancer or colon polyps.  Are older than 35 or older than 45 if you are African American.  Have inflammatory bowel disease,  such as ulcerative colitis or Crohn's disease.  Have certain hereditary conditions, such as: ? Familial adenomatous polyposis. ? Lynch syndrome. ? Turcot syndrome. ? Peutz-Jeghers syndrome.  Are overweight.  Smoke cigarettes.  Do not get enough exercise.  Drink too much alcohol.  Eat a diet that is high in fat and red meat and low in fiber.  Had childhood cancer that was treated with abdominal radiation. What are the signs or symptoms? Most polyps do not cause symptoms. If you have symptoms, they may  include:  Blood coming from your rectum when having a bowel movement.  Blood in your stool. The stool may look dark red or black.  Abdominal pain.  A change in bowel habits, such as constipation or diarrhea. How is this diagnosed? This condition is diagnosed with a colonoscopy. This is a procedure in which a lighted, flexible scope is inserted into the anus and then passed into the colon to examine the area. Polyps are sometimes found when a colonoscopy is done as part of routine cancer screening tests. How is this treated? Treatment for this condition involves removing any polyps that are found. Most polyps can be removed during a colonoscopy. Those polyps will then be tested for cancer. Additional treatment may be needed depending on the results of testing. Follow these instructions at home: Lifestyle  Maintain a healthy weight, or lose weight if recommended by your health care provider.  Exercise every day or as told by your health care provider.  Do not use any products that contain nicotine or tobacco, such as cigarettes and e-cigarettes. If you need help quitting, ask your health care provider.  If you drink alcohol, limit how much you have: ? 0-1 drink a day for women. ? 0-2 drinks a day for men.  Be aware of how much alcohol is in your drink. In the [place] one drink equals one 12 oz bottle of beer (355 mL), one 5 oz glass of wine (148 mL), or one 1 oz shot of hard liquor (44 mL). Eating and drinking   Eat foods that are high in fiber, such as fruits, vegetables, and whole grains.  Eat foods that are high in calcium and vitamin D, such as milk, cheese, yogurt, eggs, liver, fish, and broccoli.  Limit foods that are high in fat, such as fried foods and desserts.  Limit the amount of red meat and processed meat you eat, such as hot dogs, sausage, bacon, and lunch meats. General instructions  Keep all follow-up visits as told by your health care provider. This is  important. ? This includes having regularly scheduled colonoscopies. ? Talk to your health care provider about when you need a colonoscopy. Contact a health care provider if:  You have new or worsening bleeding during a bowel movement.  You have new or increased blood in your stool.  You have a change in bowel habits.  You lose weight for no known reason. Summary  Polyps are tissue growths inside the body. Polyps can grow in many places, including the colon.  Most colon polyps are noncancerous (benign), but some can become cancerous over time.  This condition is diagnosed with a colonoscopy.  Treatment for this condition involves removing any polyps that are found. Most polyps can be removed during a colonoscopy. This information is not intended to replace advice given to you by your health care provider. Make sure you discuss any questions you have with your health care provider. Document Released: 01/18/2004 Document Revised: 08/08/2017 Document  Reviewed: 08/08/2017 Elsevier Interactive Patient Education  2019 [place], Care After These instructions provide you with information about caring for yourself after your procedure. Your health care provider may also give you more specific instructions. Your treatment has been planned according to current medical practices, but problems sometimes occur. Call your health care provider if you have any problems or questions after your procedure. What can I expect after the procedure? After your procedure, you may:  Feel sleepy for several hours.  Feel clumsy and have poor balance for several hours.  Feel forgetful about what happened after the procedure.  Have poor judgment for several hours.  Feel nauseous or vomit.  Have a sore throat if you had a breathing tube during the procedure. Follow these instructions at home: For at least 24 hours after the procedure:      Have a responsible adult  stay with you. It is important to have someone help care for you until you are awake and alert.  Rest as needed.  Do not: ? Participate in activities in which you could fall or become injured. ? Drive. ? Use heavy machinery. ? Drink alcohol. ? Take sleeping pills or medicines that cause drowsiness. ? Make important decisions or sign legal documents. ? Take care of children on your own. Eating and drinking  Follow the diet that is recommended by your health care provider.  If you vomit, drink water, juice, or soup when you can drink without vomiting.  Make sure you have little or no nausea before eating solid foods. General instructions  Take over-the-counter and prescription medicines only as told by your health care provider.  If you have sleep apnea, surgery and certain medicines can increase your risk for breathing problems. Follow instructions from your health care provider about wearing your sleep device: ? Anytime you are sleeping, including during daytime naps. ? While taking prescription pain medicines, sleeping medicines, or medicines that make you drowsy.  If you smoke, do not smoke without supervision.  Keep all follow-up visits as told by your health care provider. This is important. Contact a health care provider if:  You keep feeling nauseous or you keep vomiting.  You feel light-headed.  You develop a rash.  You have a fever. Get help right away if:  You have trouble breathing. Summary  For several hours after your procedure, you may feel sleepy and have poor judgment.  Have a responsible adult stay with you for at least 24 hours or until you are awake and alert. This information is not intended to replace advice given to you by your health care provider. Make sure you discuss any questions you have with your health care provider. Document Released: 08/14/2015 Document Revised: 12/07/2016 Document Reviewed: 08/14/2015 Elsevier Interactive Patient  Education  2019 [employer].

## 2018-05-12 NOTE — H&P (Signed)
_0 @   Primary Care Physician:  [name], DO Primary Gastroenterologist:  Dr. Gala Romney  Pre-Procedure History & Physical: HPI:  Rebecca Moreno is a 53 y.o. female here for surveillance colonoscopy.  History colonoscopy polypectomy and [place] 5 years ago.  Past Medical History:  Diagnosis Date  . Asthma   . Bipolar disorder ([place])   . Colon polyp   . Depression    Bipolar  . Gastric ulcer    around 2013.  stress related  . GERD (gastroesophageal reflux disease)   . History of kidney stones   . Hyperlipidemia   . Hypothyroid   . Sleep apnea     Past Surgical History:  Procedure Laterality Date  . ABLATION ON ENDOMETRIOSIS    . COLONOSCOPY     Per patient, done around 2013 in [place], had polyp and overdue for follow up.  . OVARIAN CYST SURGERY Left   . TEMPOROMANDIBULAR JOINT SURGERY     9 surgeries  . TUBAL LIGATION      Prior to Admission medications   Medication Sig Start Date End Date Taking? Authorizing Provider  albuterol (PROVENTIL HFA;VENTOLIN HFA) 108 (90 Base) MCG/ACT inhaler Inhale 1 puff into the lungs every 6 (six) hours as needed for wheezing or shortness of breath.   Yes [provider]  CALCIUM CITRATE-VITAMIN D3 PO Take 1 tablet by mouth daily.   Yes [provider]  Cariprazine HCl (VRAYLAR) 6 MG CAPS Take 1 capsule (6 mg total) by mouth daily. 04/14/18  Yes Norman Clay, MD  Cholecalciferol (VITAMIN D3) 5000 units TABS Take 5,000 Units by mouth daily.    Yes [provider]  clobetasol cream (TEMOVATE) 3.26 % Apply 1 application topically daily as needed (irritation).    Yes [provider]  clonazePAM (KLONOPIN) 1 MG tablet Take 1 mg by mouth 2 (two) times daily.  12/31/17  Yes [provider]  Dexlansoprazole (DEXILANT) 30 MG capsule Take 30 mg by mouth daily.   Yes [provider]  famotidine (PEPCID) 20 MG tablet Take 20 mg by mouth daily.   Yes [provider]   fluticasone (FLOVENT HFA) 220 MCG/ACT inhaler Inhale 1 puff into the lungs daily.    Yes [provider]  lamoTRIgine (LAMICTAL) 100 MG tablet Take 3 tablets (300 mg total) by mouth at bedtime. 04/14/18  Yes Hisada, Elie Goody, MD  levothyroxine (SYNTHROID, LEVOTHROID) 50 MCG tablet Take 50 mcg by mouth daily before breakfast.   Yes [provider]  magnesium gluconate (MAGONATE) 500 MG tablet Take 500 mg by mouth daily.   Yes [provider]  Multiple Vitamins-Minerals (MULTIVITAMIN WOMEN PO) Take 1 tablet by mouth daily.   Yes [provider]  Na Sulfate-K Sulfate-Mg Sulf 17.5-3.13-1.6 GM/177ML SOLN Take 1 kit by mouth as directed. 04/07/18  Yes Agustina Witzke, Cristopher Estimable, MD  naproxen (NAPROSYN) 250 MG tablet Take 250 mg by mouth 2 (two) times daily with a meal.   Yes [provider]  phentermine (ADIPEX-P) 37.5 MG tablet Take 0.5-1 tablets (18.75-37.5 mg total) by mouth daily before breakfast. Patient taking differently: Take 37.5 mg by mouth daily before breakfast.  04/16/18  Yes Gottschalk, Ashly M, DO  QUEtiapine (SEROQUEL) 100 MG tablet Take 100-300 mg by mouth at bedtime.   Yes [provider]  QUEtiapine (SEROQUEL) 50 MG tablet Take 25 mg by mouth 2 (two) times daily.   Yes [provider]  sucralfate (CARAFATE) 1 g tablet Take 1 g by mouth  daily as needed.    Yes [provider]  venlafaxine XR (EFFEXOR-XR) 150 MG 24 hr capsule Take 2 capsules (300 mg total) by mouth daily. 04/14/18  Yes Norman Clay, MD  QUEtiapine (SEROQUEL) 300 MG tablet Take 1 tablet (300 mg total) by mouth at bedtime. Patient not taking: Reported on 04/25/2018 04/14/18   Norman Clay, MD    Allergies as of 04/07/2018 - Review Complete 04/07/2018  Allergen Reaction Noted  . Lithium Other (See Comments) 01/15/2018    Family History  Problem Relation Age of Onset  . Anxiety disorder Mother   . Hypertension Mother   . Heart failure Mother   . Stroke Mother    . Hyperlipidemia Father   . Diabetes Father   . Stroke Father   . Hypertension Father   . Colon polyps Father        older than 73  . Diabetes Sister   . Hypertension Sister   . Uterine cancer Sister   . Ovarian cancer Maternal Grandmother   . Colon cancer Maternal Grandmother   . Renal Disease Paternal Grandmother   . Bipolar disorder Other     Social History   Socioeconomic History  . Marital status: Married    Spouse name: Not on file  . Number of children: Not on file  . Years of education: Not on file  . Highest education level: Not on file  Occupational History  . Not on file  Social Needs  . Financial resource strain: Not on file  . Food insecurity:    Worry: Not on file    Inability: Not on file  . Transportation needs:    Medical: Not on file    Non-medical: Not on file  Tobacco Use  . Smoking status: Former Smoker    Packs/day: 0.50    Years: 30.00    Pack years: 15.00    Types: Cigarettes    Last attempt to quit: 05/07/2012    Years since quitting: 6.0  . Smokeless tobacco: Never Used  Substance and Sexual Activity  . Alcohol use: Never    Frequency: Never  . Drug use: Not Currently  . Sexual activity: Not Currently    Birth control/protection: Surgical    Comment: tubal  Lifestyle  . Physical activity:    Days per week: Not on file    Minutes per session: Not on file  . Stress: Not on file  Relationships  . Social connections:    Talks on phone: Not on file    Gets together: Not on file    Attends religious service: Not on file    Active member of club or organization: Not on file    Attends meetings of clubs or organizations: Not on file    Relationship status: Not on file  . Intimate partner violence:    Fear of current or ex partner: Not on file    Emotionally abused: Not on file    Physically abused: Not on file    Forced sexual activity: Not on file  Other Topics Concern  . Not on file  Social History Narrative  . Not on file     Review of Systems: See HPI, otherwise negative ROS  Physical Exam: BP 136/83   Pulse 80   Temp 98.2 F (36.8 C) (Oral)   Resp 16   LMP 12/13/2017 (Approximate)   SpO2 91%  General:   Alert,  Well-developed, well-nourished, pleasant and cooperative in NAD Neck:  Supple; no masses  or thyromegaly. No significant cervical adenopathy. Lungs:  Clear throughout to auscultation.   No wheezes, crackles, or rhonchi. No acute distress. Heart:  Regular rate and rhythm; no murmurs, clicks, rubs,  or gallops. Abdomen: Non-distended, normal bowel sounds.  Soft and nontender without appreciable mass or hepatosplenomegaly.  Pulses:  Normal pulses noted. Extremities:  Without clubbing or edema.  Impression/Plan: Pleasant 53 year old lady here for surveillance colonoscopy.  History of colonic polyps.  The risks, benefits, limitations, alternatives and imponderables have been reviewed with the patient. Questions have been answered. All parties are agreeable.      Notice: This dictation was prepared with Dragon dictation along with smaller phrase technology. Any transcriptional errors that result from this process are unintentional and may not be corrected upon review.

## 2018-05-12 NOTE — Anesthesia Postprocedure Evaluation (Signed)
Anesthesia Post Note  Patient: Rebecca Moreno  Procedure(s) Performed: COLONOSCOPY WITH PROPOFOL (N/A ) POLYPECTOMY  Patient location during evaluation: PACU Anesthesia Type: MAC Level of consciousness: awake and alert and oriented Pain management: pain level controlled Vital Signs Assessment: post-procedure vital signs reviewed and stable Respiratory status: spontaneous breathing Cardiovascular status: stable Postop Assessment: no apparent nausea or vomiting Anesthetic complications: no     Last Vitals:  Vitals:   05/12/18 1034 05/12/18 1124  BP: 136/83   Pulse: 80   Resp: 16 (P) 12  Temp: 36.8 C (P) 36.6 C  SpO2: 91%     Last Pain:  Vitals:   05/12/18 1120  TempSrc:   PainSc: 0-No pain                 Jenson Beedle A

## 2018-05-12 NOTE — Anesthesia Procedure Notes (Signed)
Procedure Name: MAC Performed by: Maryjean Corpening A, CRNA Pre-anesthesia Checklist: Patient identified, Emergency Drugs available, Suction available, Timeout performed and Patient being monitored Patient Re-evaluated:Patient Re-evaluated prior to induction Oxygen Delivery Method: Non-rebreather mask       

## 2018-05-12 NOTE — Transfer of Care (Signed)
Immediate Anesthesia Transfer of Care Note  Patient: Rebecca Moreno  Procedure(s) Performed: COLONOSCOPY WITH PROPOFOL (N/A ) POLYPECTOMY  Patient Location: PACU  Anesthesia Type:MAC  Level of Consciousness: awake, alert , oriented and patient cooperative  Airway & Oxygen Therapy: Patient Spontanous Breathing  Post-op Assessment: Report given to RN and Post -op Vital signs reviewed and stable  Post vital signs: Reviewed and stable  Last Vitals:  Vitals Value Taken Time  BP    Temp    Pulse 83 05/12/2018 11:24 AM  Resp    SpO2 97 % 05/12/2018 11:24 AM  Vitals shown include unvalidated device data.  Last Pain:  Vitals:   05/12/18 1120  TempSrc:   PainSc: 0-No pain      Patients Stated Pain Goal: 6 (95/32/02 3343)  Complications: No apparent anesthesia complications

## 2018-05-12 NOTE — Op Note (Signed)
[facility] Patient Name: Rebecca Moreno Procedure Date: 05/12/2018 10:12 AM MRN: [id number] Date of Birth: 31-Jul-1965 Attending MD: Norvel Richards , MD CSN: [id number] Age: 53 Admit Type: Outpatient Procedure:                Colonoscopy Indications:              High risk colon cancer surveillance: Personal                            history of colonic polyps Providers:                Norvel Richards, MD, Jeanann Lewandowsky. Sharon Seller, RN,                            Aram Candela Referring MD:              Medicines:                Propofol per Anesthesia Complications:            No immediate complications. Estimated Blood Loss:     Estimated blood loss was minimal. Procedure:                Pre-Anesthesia Assessment:                           - Prior to the procedure, a History and Physical                            was performed, and patient medications and                            allergies were reviewed. The patient's tolerance of                            previous anesthesia was also reviewed. The risks                            and benefits of the procedure and the sedation                            options and risks were discussed with the patient.                            All questions were answered, and informed consent                            was obtained. Prior Anticoagulants: The patient has                            taken no previous anticoagulant or antiplatelet                            agents. ASA Grade Assessment: II - A patient with  mild systemic disease. After reviewing the risks                            and benefits, the patient was deemed in                            satisfactory condition to undergo the procedure.                           After obtaining informed consent, the colonoscope                            was passed under direct vision. Throughout the                            procedure, the patient's  blood pressure, pulse, and                            oxygen saturations were monitored continuously. The                            CF-[id number] ([id number]) scope was introduced through                            the and advanced to the the cecum, identified by                            appendiceal orifice and ileocecal valve. The                            colonoscopy was performed without difficulty. The                            patient tolerated the procedure well. The quality                            of the bowel preparation was adequate. The                            ileocecal valve, appendiceal orifice, and rectum                            were photographed. The entire colon was well                            visualized. Scope In: 10:57:32 AM Scope Out: 11:18:33 AM Scope Withdrawal Time: 0 hours 13 minutes 21 seconds  Total Procedure Duration: 0 hours 21 minutes 1 second  Findings:      The perianal and digital rectal examinations were normal.      A 5 mm polyp was found in the splenic flexure. The polyp was sessile.       The polyp was removed with a cold snare. Resection and retrieval were       complete. Estimated blood loss was minimal.  The exam was otherwise without abnormality on direct and retroflexion       views. Impression:               - One 5 mm polyp at the splenic flexure, removed                            with a cold snare. Resected and retrieved.                           - The examination was otherwise normal on direct                            and retroflexion views. Moderate Sedation:      Moderate (conscious) sedation was personally administered by an       anesthesia professional. The following parameters were monitored: oxygen       saturation, heart rate, blood pressure, respiratory rate, EKG, adequacy       of pulmonary ventilation, and response to care. Recommendation:           - Patient has a contact number available for                             emergencies. The signs and symptoms of potential                            delayed complications were discussed with the                            patient. Return to normal activities tomorrow.                            Written discharge instructions were provided to the                            patient.                           - Advance diet as tolerated.                           - Continue present medications.                           - Repeat colonoscopy date to be determined after                            pending pathology results are reviewed for                            surveillance based on pathology results.                           - Return to GI office (date not yet determined). Procedure Code(s):        --- Professional ---  45385, Colonoscopy, flexible; with removal of                            tumor(s), polyp(s), or other lesion(s) by snare                            technique Diagnosis Code(s):        --- Professional ---                           Z86.010, Personal history of colonic polyps                           D12.3, Benign neoplasm of transverse colon (hepatic                            flexure or splenic flexure) CPT copyright 2018 American Medical Association. All rights reserved. The codes documented in this report are preliminary and upon coder review may  be revised to meet current compliance requirements. Cristopher Estimable. Sherrian Nunnelley, MD Norvel Richards, MD 05/12/2018 11:24:29 AM This report has been signed electronically. Number of Addenda: 0

## 2018-05-13 ENCOUNTER — Other Ambulatory Visit (HOSPITAL_COMMUNITY): Payer: Self-pay | Admitting: Psychiatry

## 2018-05-13 ENCOUNTER — Encounter: Payer: Self-pay | Admitting: Internal Medicine

## 2018-05-13 MED ORDER — QUETIAPINE FUMARATE 300 MG PO TABS: 300.0000 mg | ORAL_TABLET | Freq: Every day | ORAL | 1 refills | Status: DC

## 2018-05-19 ENCOUNTER — Other Ambulatory Visit: Payer: Self-pay

## 2018-05-19 ENCOUNTER — Ambulatory Visit (INDEPENDENT_AMBULATORY_CARE_PROVIDER_SITE_OTHER): Payer: Medicare Other | Admitting: Family Medicine

## 2018-05-19 ENCOUNTER — Encounter: Payer: Self-pay | Admitting: Family Medicine

## 2018-05-19 DIAGNOSIS — R030 Elevated blood-pressure reading, without diagnosis of hypertension: Secondary | ICD-10-CM

## 2018-05-19 DIAGNOSIS — R740 Nonspecific elevation of levels of transaminase and lactic acid dehydrogenase [LDH]: Principal | ICD-10-CM

## 2018-05-19 DIAGNOSIS — J069 Acute upper respiratory infection, unspecified: Secondary | ICD-10-CM

## 2018-05-19 DIAGNOSIS — R7401 Elevation of levels of liver transaminase levels: Secondary | ICD-10-CM

## 2018-05-19 DIAGNOSIS — B9789 Other viral agents as the cause of diseases classified elsewhere: Secondary | ICD-10-CM | POA: Diagnosis not present

## 2018-05-19 MED ORDER — PHENTERMINE HCL 37.5 MG PO TABS: 37.5000 mg | ORAL_TABLET | Freq: Every day | ORAL | 0 refills | Status: DC

## 2018-05-19 NOTE — Patient Instructions (Signed)
Keep an eye on blood pressure.  Your goal is less than 140/90. Use LoseIt or MyFitnessPal app to keep a food diary.   It appears that you have a viral upper respiratory infection (cold).  Cold symptoms can last up to 2 weeks.    - Get plenty of rest and drink plenty of fluids. - Try to breathe moist air. Use a cold mist humidifier. - Consume warm fluids (soup or tea) to provide relief for a stuffy nose and to loosen phlegm. - For nasal stuffiness, try saline nasal spray or a Neti Pot. Afrin nasal spray can also be used but this product should not be used longer than 3 days or it will cause rebound nasal stuffiness (worsening nasal congestion). - For sore throat pain relief: use chloraseptic spray, suck on throat lozenges, hard candy or popsicles; gargle with warm salt water (1/4 tsp. salt per 8 oz. of water); and eat soft, bland foods. - Eat a well-balanced diet. If you cannot, ensure you are getting enough nutrients by taking a daily multivitamin. - Avoid dairy products, as they can thicken phlegm. - Avoid alcohol, as it impairs your body's immune system.  CONTACT YOUR DOCTOR IF YOU EXPERIENCE ANY OF THE FOLLOWING: - High fever - Ear pain - Sinus-type headache - Unusually severe cold symptoms - Cough that gets worse while other cold symptoms improve - Flare up of any chronic lung problem, such as asthma - Your symptoms persist longer than 2 weeks

## 2018-05-19 NOTE — Progress Notes (Signed)
Subjective: CC: Follow up weight management appointment HPI: Rebecca Moreno is a 53 y.o. female that presents today for follow up weight loss visit.   1.  Obesity Patient reports compliance and good tolerance of phentermine 37.5 milligrams daily.  Side effects identified: None.  She is tolerating medicine without difficulty.  Denies abdominal pain, nausea, vomiting, diarrhea, chest pain, shortness of breath, heart palpitations, insomnia, headache, dry mouth or constipation.  Fluid intake: Good.  Patient is weighing daily at home.  She reports a low-carb, high plant diet.  She typically skips breakfast and lunch and only has supper.  She notes that she is not hungry during the day but does hydrate quite frequently and this seems to keep her satiated.  She is not monitoring blood pressures at home.  She reports regular physical activity, where she is walking several days per week.  2.  URI Patient reports nonproductive cough that is been present for the last couple of days.  Initially, there was an associated sore throat with this is since resolved.  She denies any fevers, hemoptysis or shortness of breath.  She has been using Delsym with good improvement of her symptoms.   Past Medical History:  Diagnosis Date  . Asthma   . Bipolar disorder ([place])   . Colon polyp   . Depression    Bipolar  . Gastric ulcer    around 2013.  stress related  . GERD (gastroesophageal reflux disease)   . History of kidney stones   . Hyperlipidemia   . Hypothyroid   . Sleep apnea     Current Outpatient Medications:  .  albuterol (PROVENTIL HFA;VENTOLIN HFA) 108 (90 Base) MCG/ACT inhaler, Inhale 1 puff into the lungs every 6 (six) hours as needed for wheezing or shortness of breath., Disp: , Rfl:  .  CALCIUM CITRATE-VITAMIN D3 PO, Take 1 tablet by mouth daily., Disp: , Rfl:  .  Cariprazine HCl (VRAYLAR) 6 MG CAPS, Take 1 capsule (6 mg total) by mouth daily., Disp: 30 capsule, Rfl: 1 .  Cholecalciferol  (VITAMIN D3) 5000 units TABS, Take 5,000 Units by mouth daily. , Disp: , Rfl:  .  clobetasol cream (TEMOVATE) 1.63 %, Apply 1 application topically daily as needed (irritation). , Disp: , Rfl:  .  clonazePAM (KLONOPIN) 1 MG tablet, Take 1 mg by mouth 2 (two) times daily. , Disp: , Rfl: 2 .  Dexlansoprazole (DEXILANT) 30 MG capsule, Take 30 mg by mouth daily., Disp: , Rfl:  .  famotidine (PEPCID) 20 MG tablet, Take 20 mg by mouth daily., Disp: , Rfl:  .  fluticasone (FLOVENT HFA) 220 MCG/ACT inhaler, Inhale 1 puff into the lungs daily. , Disp: , Rfl:  .  lamoTRIgine (LAMICTAL) 100 MG tablet, Take 3 tablets (300 mg total) by mouth at bedtime., Disp: 90 tablet, Rfl: 1 .  levothyroxine (SYNTHROID, LEVOTHROID) 50 MCG tablet, Take 50 mcg by mouth daily before breakfast., Disp: , Rfl:  .  magnesium gluconate (MAGONATE) 500 MG tablet, Take 500 mg by mouth daily., Disp: , Rfl:  .  Multiple Vitamins-Minerals (MULTIVITAMIN WOMEN PO), Take 1 tablet by mouth daily., Disp: , Rfl:  .  Na Sulfate-K Sulfate-Mg Sulf 17.5-3.13-1.6 GM/177ML SOLN, Take 1 kit by mouth as directed., Disp: 1 Bottle, Rfl: 0 .  naproxen (NAPROSYN) 250 MG tablet, Take 250 mg by mouth 2 (two) times daily with a meal., Disp: , Rfl:  .  phentermine (ADIPEX-P) 37.5 MG tablet, Take 1 tablet (37.5 mg total) by  mouth daily before breakfast., Disp: 30 tablet, Rfl: 0 .  QUEtiapine (SEROQUEL) 100 MG tablet, Take 100-300 mg by mouth at bedtime., Disp: , Rfl:  .  QUEtiapine (SEROQUEL) 300 MG tablet, Take 1 tablet (300 mg total) by mouth at bedtime., Disp: 90 tablet, Rfl: 1 .  QUEtiapine (SEROQUEL) 50 MG tablet, Take 25 mg by mouth 2 (two) times daily., Disp: , Rfl:  .  sucralfate (CARAFATE) 1 g tablet, Take 1 g by mouth daily as needed. , Disp: , Rfl:  .  venlafaxine XR (EFFEXOR-XR) 150 MG 24 hr capsule, Take 2 capsules (300 mg total) by mouth daily., Disp: 60 capsule, Rfl: 1 Social History   Socioeconomic History  . Marital status: Married     Spouse name: Not on file  . Number of children: Not on file  . Years of education: Not on file  . Highest education level: Not on file  Occupational History  . Not on file  Social Needs  . Financial resource strain: Not on file  . Food insecurity:    Worry: Not on file    Inability: Not on file  . Transportation needs:    Medical: Not on file    Non-medical: Not on file  Tobacco Use  . Smoking status: Former Smoker    Packs/day: 0.50    Years: 30.00    Pack years: 15.00    Types: Cigarettes    Last attempt to quit: 05/07/2012    Years since quitting: 6.0  . Smokeless tobacco: Never Used  Substance and Sexual Activity  . Alcohol use: Never    Frequency: Never  . Drug use: Not Currently  . Sexual activity: Not Currently    Birth control/protection: Surgical    Comment: tubal  Lifestyle  . Physical activity:    Days per week: Not on file    Minutes per session: Not on file  . Stress: Not on file  Relationships  . Social connections:    Talks on phone: Not on file    Gets together: Not on file    Attends religious service: Not on file    Active member of club or organization: Not on file    Attends meetings of clubs or organizations: Not on file    Relationship status: Not on file  . Intimate partner violence:    Fear of current or ex partner: Not on file    Emotionally abused: Not on file    Physically abused: Not on file    Forced sexual activity: Not on file  Other Topics Concern  . Not on file  Social History Narrative  . Not on file   Allergies  Allergen Reactions  . Lithium Other (See Comments)    Psoriasis    Family History  Problem Relation Age of Onset  . Anxiety disorder Mother   . Hypertension Mother   . Heart failure Mother   . Stroke Mother   . Hyperlipidemia Father   . Diabetes Father   . Stroke Father   . Hypertension Father   . Colon polyps Father        older than 37  . Diabetes Sister   . Hypertension Sister   . Uterine cancer Sister    . Ovarian cancer Maternal Grandmother   . Colon cancer Maternal Grandmother   . Renal Disease Paternal Grandmother   . Bipolar disorder Other     ROS: Per HPI  Objective: Office vital signs reviewed. BP (!) 133/97  Pulse (!) 101   Temp 98.1 F (36.7 C) (Oral)   Ht '5\' 6"'$  (1.676 m)   Wt 220 lb (99.8 kg)   LMP 12/13/2017 (Approximate)   BMI 35.51 kg/m   Physical Examination:  General: Awake, alert, well nourished, No acute distress HEENT: Normal    Neck: No masses palpated. Thyroid not palpable.     Eyes: PERRLA, extraocular movement in tact, sclera white; no exophthalmos     Throat: moist mucus membranes, Airway is patent Cardio: regular rate and rhythm, S1S2 heard, no murmurs appreciated Pulm: clear to auscultation bilaterally, no wheezes, rhonchi or rales; normal work of breathing on room air Extremities: warm, well perfused, No edema, cyanosis or clubbing; +2 pulses bilaterally MSK: normal gait and station Skin: dry; intact; no rashes or lesions; no acanthosis nigricans observed Psych: Mood stable, speech normal, affect appropriate, good eye contact  Depression screen [facility] 2/9 05/19/2018 04/16/2018 03/04/2018  Decreased Interest '1 1 1  '$ Down, Depressed, Hopeless '1 1 1  '$ PHQ - 2 Score '2 2 2  '$ Altered sleeping '1 1 1  '$ Tired, decreased energy '1 1 1  '$ Change in appetite '1 1 1  '$ Feeling bad or failure about yourself  0 1 1  Trouble concentrating '1 1 1  '$ Moving slowly or fidgety/restless 0 0 1  Suicidal thoughts 0 0 0  PHQ-9 Score '6 7 8  '$ Difficult doing work/chores Not difficult at all Not difficult at all Somewhat difficult    GAD 7 : Generalized Anxiety Score 04/16/2018  Nervous, Anxious, on Edge 1  Control/stop worrying 0  Worry too much - different things 0  Trouble relaxing 1  Restless 0  Easily annoyed or irritable 1  Afraid - awful might happen 0  Total GAD 7 Score 3  Anxiety Difficulty Not difficult at all    Assessment/ Plan: 53 y.o. femalehere for follow  up weight management visit.  1. Morbid obesity ([place]) She is tolerating phentermine without difficulty.  We will continue with 1 more month of medication.  Again, we discussed possible referral to obesity medicine but patient wishes to hold off on this.  She is doing an excellent job with exercise and modification of diet.  We did discuss that I would like her to maintain a food diary and work on getting at least 2 smaller meals prior to supper and.  We talked about high-protein meals.  I would like her to avoid starvation mode so as to prevent rebound weight gain.  She will follow-up in 1 month or sooner if needed.  2. Viral URI with cough No evidence of bacterial infection on exam today.  Continue supportive care measures.  Follow-up PRN.  3. Elevated blood pressure reading Possibly related to OTC cough and cold medication versus Adipex.  Blood pressure was rechecked during today's office visit by the nurse manually and was normal.  I have instructed her to keep an eye on her blood pressure readings at home.  If persistently elevated, we will plan to discontinue the Adipex as this may be causing blood pressure.   Meds ordered this encounter  Medications  . phentermine (ADIPEX-P) 37.5 MG tablet    Sig: Take 1 tablet (37.5 mg total) by mouth daily before breakfast.    Dispense:  30 tablet    Refill:  0     Viktoriya Glaspy Windell Moulding, DO [place] [facility] [phone]

## 2018-05-20 ENCOUNTER — Other Ambulatory Visit: Payer: Self-pay

## 2018-05-20 DIAGNOSIS — R7401 Elevation of levels of liver transaminase levels: Secondary | ICD-10-CM

## 2018-05-20 DIAGNOSIS — R740 Nonspecific elevation of levels of transaminase and lactic acid dehydrogenase [LDH]: Principal | ICD-10-CM

## 2018-05-22 ENCOUNTER — Encounter (HOSPITAL_COMMUNITY): Payer: Self-pay | Admitting: Internal Medicine

## 2018-06-11 NOTE — Progress Notes (Signed)
BH MD/PA/NP OP Progress Note  06/16/2018 2:22 PM Rebecca Moreno  MRN:  [id number]  Chief Complaint:  Chief Complaint    Follow-up; Depression     HPI:  Patient presents for follow-up appointment for mood disorder.  She states that she was told by her husband that there "not a couple" the next day after the Christmas.  Although they visited her sister's place on Christmas, her husband went out to join the party by himself.  She believes that he is seeing a girl before he made that comment. She did not expect this to happen, stating that they are together for more than 20 years. She "thought everything is good to me." When she is asked about the interaction with him at the prior visit, her husband declined to talk with the patient after the visit. She hopes to talk with him and she wants to work on marriage.  She states that her mother in law with dementia told the patient that she could stay as long as she wants.  She also talks about her sister; she does not think her sister cares about the patient as much, referring that her sister does not visit the patient.  She usually has good time being with her sister, and her nephew and niece.  She has insomnia.  She feels depressed.  She has fair concentration.  She had passive SI this week.  She denies decreased need for sleep or euphoria.  She feels anxious and tense at times.  She occasionally takes clonazepam for anxiety, although she does not take it every day.  She denies panic attacks.   On phentermine,  . Clonazepam filled on 05/15/2018   Wt Readings from Last 3 Encounters:  06/16/18 218 lb (98.9 kg)  05/19/18 220 lb (99.8 kg)  05/06/18 218 lb (98.9 kg)    Visit Diagnosis:    ICD-10-CM   1. Mood disorder ([place]) F39     Past Psychiatric History: Please see initial evaluation for full details. I have reviewed the history. No updates at this time.     Past Medical History:  Past Medical History:  Diagnosis Date  . Asthma   . Bipolar  disorder ([place])   . Colon polyp   . Depression    Bipolar  . Gastric ulcer    around 2013.  stress related  . GERD (gastroesophageal reflux disease)   . History of kidney stones   . Hyperlipidemia   . Hypothyroid   . Sleep apnea     Past Surgical History:  Procedure Laterality Date  . ABLATION ON ENDOMETRIOSIS    . COLONOSCOPY     Per patient, done around 2013 in [place], had polyp and overdue for follow up.  . COLONOSCOPY WITH PROPOFOL N/A 05/12/2018   Procedure: COLONOSCOPY WITH PROPOFOL;  Surgeon: Daneil Dolin, MD;  Location: [facility];  Service: Endoscopy;  Laterality: N/A;  12:00pm  . OVARIAN CYST SURGERY Left   . POLYPECTOMY  05/12/2018   Procedure: POLYPECTOMY;  Surgeon: Daneil Dolin, MD;  Location: [facility];  Service: Endoscopy;;  polyp at splenic flexure  . TEMPOROMANDIBULAR JOINT SURGERY     9 surgeries  . TUBAL LIGATION      Family Psychiatric History: Please see initial evaluation for full details. I have reviewed the history. No updates at this time.     Family History:  Family History  Problem Relation Age of Onset  . Anxiety disorder Mother   . Hypertension Mother   .  Heart failure Mother   . Stroke Mother   . Hyperlipidemia Father   . Diabetes Father   . Stroke Father   . Hypertension Father   . Colon polyps Father        older than 66  . Diabetes Sister   . Hypertension Sister   . Uterine cancer Sister   . Ovarian cancer Maternal Grandmother   . Colon cancer Maternal Grandmother   . Renal Disease Paternal Grandmother   . Bipolar disorder Other     Social History:  Social History   Socioeconomic History  . Marital status: Married    Spouse name: Not on file  . Number of children: Not on file  . Years of education: Not on file  . Highest education level: Not on file  Occupational History  . Not on file  Social Needs  . Financial resource strain: Not on file  . Food insecurity:    Worry: Not on file    Inability: Not on  file  . Transportation needs:    Medical: Not on file    Non-medical: Not on file  Tobacco Use  . Smoking status: Former Smoker    Packs/day: 0.50    Years: 30.00    Pack years: 15.00    Types: Cigarettes    Last attempt to quit: 05/07/2012    Years since quitting: 6.1  . Smokeless tobacco: Never Used  Substance and Sexual Activity  . Alcohol use: Never    Frequency: Never  . Drug use: Not Currently  . Sexual activity: Not Currently    Birth control/protection: Surgical    Comment: tubal  Lifestyle  . Physical activity:    Days per week: Not on file    Minutes per session: Not on file  . Stress: Not on file  Relationships  . Social connections:    Talks on phone: Not on file    Gets together: Not on file    Attends religious service: Not on file    Active member of club or organization: Not on file    Attends meetings of clubs or organizations: Not on file    Relationship status: Not on file  Other Topics Concern  . Not on file  Social History Narrative  . Not on file    Allergies:  Allergies  Allergen Reactions  . Lithium Other (See Comments)    Psoriasis     Metabolic Disorder Labs: No results found for: HGBA1C, MPG No results found for: PROLACTIN Lab Results  Component Value Date   CHOL 268 (H) 01/15/2018   TRIG 218 (H) 01/15/2018   HDL 56 01/15/2018   CHOLHDL 4.8 (H) 01/15/2018   LDLCALC 168 (H) 01/15/2018   Lab Results  Component Value Date   TSH 1.550 04/16/2018   TSH 2.470 01/15/2018    Therapeutic Level Labs: No results found for: LITHIUM No results found for: VALPROATE No components found for:  CBMZ  Current Medications: Current Outpatient Medications  Medication Sig Dispense Refill  . albuterol (PROVENTIL HFA;VENTOLIN HFA) 108 (90 Base) MCG/ACT inhaler Inhale 1 puff into the lungs every 6 (six) hours as needed for wheezing or shortness of breath.    Marland Kitchen CALCIUM CITRATE-VITAMIN D3 PO Take 1 tablet by mouth daily.    Derrill Memo ON 07/08/2018]  Cariprazine HCl (VRAYLAR) 6 MG CAPS Take 1 capsule (6 mg total) by mouth daily. 30 capsule 2  . Cholecalciferol (VITAMIN D3) 5000 units TABS Take 5,000 Units by mouth daily.     Marland Kitchen  clobetasol cream (TEMOVATE) 3.08 % Apply 1 application topically daily as needed (irritation).     . clonazePAM (KLONOPIN) 1 MG tablet Take 1 mg by mouth 2 (two) times daily.   2  . Dexlansoprazole (DEXILANT) 30 MG capsule Take 30 mg by mouth daily.    . famotidine (PEPCID) 20 MG tablet Take 20 mg by mouth daily.    . fluticasone (FLOVENT HFA) 220 MCG/ACT inhaler Inhale 1 puff into the lungs daily.     Derrill Memo ON 07/08/2018] lamoTRIgine (LAMICTAL) 100 MG tablet Take 3 tablets (300 mg total) by mouth at bedtime. 90 tablet 2  . levothyroxine (SYNTHROID, LEVOTHROID) 50 MCG tablet Take 50 mcg by mouth daily before breakfast.    . magnesium gluconate (MAGONATE) 500 MG tablet Take 500 mg by mouth daily.    . Multiple Vitamins-Minerals (MULTIVITAMIN WOMEN PO) Take 1 tablet by mouth daily.    . Na Sulfate-K Sulfate-Mg Sulf 17.5-3.13-1.6 GM/177ML SOLN Take 1 kit by mouth as directed. 1 Bottle 0  . naproxen (NAPROSYN) 250 MG tablet Take 250 mg by mouth 2 (two) times daily with a meal.    . phentermine (ADIPEX-P) 37.5 MG tablet Take 1 tablet (37.5 mg total) by mouth daily before breakfast. 30 tablet 0  . QUEtiapine (SEROQUEL) 100 MG tablet Take 100-300 mg by mouth at bedtime.    Derrill Memo ON 07/08/2018] QUEtiapine (SEROQUEL) 300 MG tablet Take 1 tablet (300 mg total) by mouth at bedtime. 90 tablet 2  . QUEtiapine (SEROQUEL) 50 MG tablet Take 25 mg by mouth 2 (two) times daily.    . sucralfate (CARAFATE) 1 g tablet Take 1 g by mouth daily as needed.     Derrill Memo ON 07/08/2018] venlafaxine XR (EFFEXOR-XR) 150 MG 24 hr capsule Take 2 capsules (300 mg total) by mouth daily. 60 capsule 2   No current facility-administered medications for this visit.      Musculoskeletal: Strength & Muscle Tone: within normal limits Gait & Station:  normal Patient leans: N/A  Psychiatric Specialty Exam: Review of Systems  Psychiatric/Behavioral: Positive for depression. Negative for hallucinations, memory loss, substance abuse and suicidal ideas. The patient is nervous/anxious and has insomnia.   All other systems reviewed and are negative.   Blood pressure 131/85, pulse (!) 101, height '5\' 6"'$  (1.676 m), weight 218 lb (98.9 kg), last menstrual period 12/13/2017, SpO2 93 %.Body mass index is 35.19 kg/m.  General Appearance: Fairly Groomed  Eye Contact:  Good  Speech:  Clear and Coherent  Volume:  Normal  Mood:  Depressed  Affect:  Appropriate, Congruent, Restricted and Tearful  Thought Process:  Coherent  Orientation:  Full (Time, Place, and Person)  Thought Content: Logical   Suicidal Thoughts:  No  Homicidal Thoughts:  No  Memory:  Immediate;   Good  Judgement:  Good  Insight:  Fair  Psychomotor Activity:  Normal  Concentration:  Concentration: Good and Attention Span: Good  Recall:  Good  Fund of Knowledge: Good  Language: Good  Akathisia:  No  Handed:  Right  AIMS (if indicated): not done  Assets:  Communication Skills Desire for Improvement  ADL's:  Intact  Cognition: WNL  Sleep:  Poor   Screenings: GAD-7     Office Visit from 04/16/2018 in [facility]  Total GAD-7 Score  3    PHQ2-9     Office Visit from 05/19/2018 in [place] Office Visit from 04/16/2018 in [facility] Office Visit from  03/04/2018 in [place] Procedure visit from 02/06/2018 in [place] Office Visit from 01/15/2018 in [place]  PHQ-2 Total Score  '2  2  2  2  4  '$ PHQ-9 Total Score  '6  7  8  15  15       '$ Assessment and Plan:  TYKISHA AREOLA is a 53 y.o. year old female with a history of bipolar disorder by history, hypothyroidism , who presents for follow up appointment for Mood disorder ([place])  #Unspecified  mood disorder # Bipolar I disorder by history # r/o MDD with psychotic features Exam is notable for less emotional lability, and patient reports overall improvement in depressive symptoms since the last visit.  Psychosocial stressors including marital conflict, and she has very low self-esteem.  Will continue venlafaxine to target depression.  Will continue lamotrigine to target mood dysregulation.  Discussed risk of Stevens-Johnson syndrome.  Will continue Vraylar and quetiapine to target mood dysregulation.  Although it is preferable to do monotherapy, she has a history of relapse in her symptoms monotherapy.  Discussed potential metabolic side effect and EPS.  Will continue clonazepam for anxiety.  Discussed risk of dependence and oversedation.  Noted that although the patient was diagnosed with bipolar disorder in the past, the patient and her husband denies any manic/hypomanic episode except she had mild irritability, paranoia in the context of depression.  Will continue to monitor.   Plan I have reviewed and updated plans as below 1. Continue Venlafaxine 300 mg daily  2. Continue Lamotrigine 300 mg daily  3. Continue Vraylar 6 mg  4. Continue Quetiapine 300 mg at night  5. Continue Clonazepam 1 mg twice a day as needed for anxiety 6. Referral to therapy  7.Return to clinic in three months for 15 mins 8. Obtain record from your previous psychiatrist  Past trials of medication:sertraline, fluoxetine, lexapro,Celexa, duloxetine,Wellbutrinlithium (psoriasis), Depakote(did not work), olanzapine, Abilify(weight gain), latuda, Geodon  The patient demonstrates the following risk factors for suicide: Chronic risk factors for suicide include:psychiatric disorder ofdepression. Acute risk factorsfor suicide include: family or marital conflict and unemployment. Protective factorsfor this patient include: positive social support and hope for the future. Considering these factors, the  overall suicide risk at this point appears to below. Patientisappropriate for outpatient follow up.  The duration of this appointment visit was 25 minutes of face-to-face time with the patient.  Greater than 50% of this time was spent in counseling, explanation of  diagnosis, planning of further management, and coordination of care.  Norman Clay, MD 06/16/2018, 2:22 PM

## 2018-06-16 ENCOUNTER — Ambulatory Visit (INDEPENDENT_AMBULATORY_CARE_PROVIDER_SITE_OTHER): Payer: Medicare Other | Admitting: Psychiatry

## 2018-06-16 VITALS — BP 131/85 | HR 101 | Ht 66.0 in | Wt 218.0 lb

## 2018-06-16 DIAGNOSIS — F39 Unspecified mood [affective] disorder: Secondary | ICD-10-CM | POA: Diagnosis not present

## 2018-06-16 MED ORDER — QUETIAPINE FUMARATE 300 MG PO TABS: 300.0000 mg | ORAL_TABLET | Freq: Every day | ORAL | 2 refills | Status: DC

## 2018-06-16 MED ORDER — CARIPRAZINE HCL 6 MG PO CAPS: 1.0000 | ORAL_CAPSULE | Freq: Every day | ORAL | 2 refills | Status: DC

## 2018-06-16 MED ORDER — LAMOTRIGINE 100 MG PO TABS: 300.0000 mg | ORAL_TABLET | Freq: Every day | ORAL | 2 refills | Status: DC

## 2018-06-16 MED ORDER — VENLAFAXINE HCL ER 150 MG PO CP24: 300.0000 mg | ORAL_CAPSULE | Freq: Every day | ORAL | 2 refills | Status: DC

## 2018-06-16 NOTE — Patient Instructions (Addendum)
1. Continue Venlafaxine 300 mg daily  2. Continue Lamotrigine 300 mg daily  3. Continue Vraylar 6 mg  4. Continue Quetiapine 300 mg at night  5. Continue Clonazepam 1 mg twice a day as needed for anxiety 6. Referral to therapy  7.Return to clinic in three months for 15 mins

## 2018-06-20 ENCOUNTER — Ambulatory Visit (INDEPENDENT_AMBULATORY_CARE_PROVIDER_SITE_OTHER): Payer: Medicare Other | Admitting: Family Medicine

## 2018-06-20 ENCOUNTER — Encounter: Payer: Self-pay | Admitting: Family Medicine

## 2018-06-20 DIAGNOSIS — F439 Reaction to severe stress, unspecified: Secondary | ICD-10-CM | POA: Diagnosis not present

## 2018-06-20 DIAGNOSIS — K760 Fatty (change of) liver, not elsewhere classified: Secondary | ICD-10-CM | POA: Diagnosis not present

## 2018-06-20 DIAGNOSIS — Z713 Dietary counseling and surveillance: Secondary | ICD-10-CM

## 2018-06-20 MED ORDER — PHENTERMINE HCL 37.5 MG PO TABS: 37.5000 mg | ORAL_TABLET | Freq: Every day | ORAL | 0 refills | Status: DC

## 2018-06-20 NOTE — Patient Instructions (Addendum)
See me back in 1 month for weight check.  Start food journaling.  Get active.

## 2018-06-20 NOTE — Progress Notes (Signed)
Subjective: CC: Follow up weight management appointment HPI: Rebecca Moreno is a 53 y.o. female that presents today for follow up weight loss visit.   1.  Obesity/ OSA on CPAP Patient reports compliance and good tolerance of phentermine 37.5 milligrams daily.  She reports good tolerance of medication.  Denies any dry mouth, constipation.  She reports good energy.  She is going through difficult time with separation from her husband.  She reports depression.  She is seeing a psychiatrist and will be seeing a therapist soon.  She feels that sometimes this weighs in on her ability to lose weight.  She has not become much more active than her normal lately because of the cold outside.  She identifies this is a barrier to walking/exercising.  She has OSA on CPAP.  She notes compliance and that it is working well for her.  It is 53 years old.   Past Medical History:  Diagnosis Date  . Asthma   . Bipolar disorder ([place])   . Colon polyp   . Depression    Bipolar  . Gastric ulcer    around 2013.  stress related  . GERD (gastroesophageal reflux disease)   . History of kidney stones   . Hyperlipidemia   . Hypothyroid   . Sleep apnea     Current Outpatient Medications:  .  albuterol (PROVENTIL HFA;VENTOLIN HFA) 108 (90 Base) MCG/ACT inhaler, Inhale 1 puff into the lungs every 6 (six) hours as needed for wheezing or shortness of breath., Disp: , Rfl:  .  CALCIUM CITRATE-VITAMIN D3 PO, Take 1 tablet by mouth daily., Disp: , Rfl:  .  [START ON 07/08/2018] Cariprazine HCl (VRAYLAR) 6 MG CAPS, Take 1 capsule (6 mg total) by mouth daily., Disp: 30 capsule, Rfl: 2 .  Cholecalciferol (VITAMIN D3) 5000 units TABS, Take 5,000 Units by mouth daily. , Disp: , Rfl:  .  clobetasol cream (TEMOVATE) 3.14 %, Apply 1 application topically daily as needed (irritation). , Disp: , Rfl:  .  clonazePAM (KLONOPIN) 1 MG tablet, Take 1 mg by mouth 2 (two) times daily. , Disp: , Rfl: 2 .  Dexlansoprazole (DEXILANT) 30 MG  capsule, Take 30 mg by mouth daily., Disp: , Rfl:  .  famotidine (PEPCID) 20 MG tablet, Take 20 mg by mouth daily., Disp: , Rfl:  .  fluticasone (FLOVENT HFA) 220 MCG/ACT inhaler, Inhale 1 puff into the lungs daily. , Disp: , Rfl:  .  [START ON 07/08/2018] lamoTRIgine (LAMICTAL) 100 MG tablet, Take 3 tablets (300 mg total) by mouth at bedtime., Disp: 90 tablet, Rfl: 2 .  levothyroxine (SYNTHROID, LEVOTHROID) 50 MCG tablet, Take 50 mcg by mouth daily before breakfast., Disp: , Rfl:  .  magnesium gluconate (MAGONATE) 500 MG tablet, Take 500 mg by mouth daily., Disp: , Rfl:  .  Multiple Vitamins-Minerals (MULTIVITAMIN WOMEN PO), Take 1 tablet by mouth daily., Disp: , Rfl:  .  Na Sulfate-K Sulfate-Mg Sulf 17.5-3.13-1.6 GM/177ML SOLN, Take 1 kit by mouth as directed., Disp: 1 Bottle, Rfl: 0 .  naproxen (NAPROSYN) 250 MG tablet, Take 250 mg by mouth 2 (two) times daily with a meal., Disp: , Rfl:  .  phentermine (ADIPEX-P) 37.5 MG tablet, Take 1 tablet (37.5 mg total) by mouth daily before breakfast., Disp: 30 tablet, Rfl: 0 .  QUEtiapine (SEROQUEL) 100 MG tablet, Take 100-300 mg by mouth at bedtime., Disp: , Rfl:  .  [START ON 07/08/2018] QUEtiapine (SEROQUEL) 300 MG tablet, Take 1 tablet (300  mg total) by mouth at bedtime., Disp: 90 tablet, Rfl: 2 .  QUEtiapine (SEROQUEL) 50 MG tablet, Take 25 mg by mouth 2 (two) times daily., Disp: , Rfl:  .  sucralfate (CARAFATE) 1 g tablet, Take 1 g by mouth daily as needed. , Disp: , Rfl:  .  [START ON 07/08/2018] venlafaxine XR (EFFEXOR-XR) 150 MG 24 hr capsule, Take 2 capsules (300 mg total) by mouth daily., Disp: 60 capsule, Rfl: 2 Social History   Socioeconomic History  . Marital status: Married    Spouse name: Not on file  . Number of children: Not on file  . Years of education: Not on file  . Highest education level: Not on file  Occupational History  . Not on file  Social Needs  . Financial resource strain: Not on file  . Food insecurity:    Worry: Not on  file    Inability: Not on file  . Transportation needs:    Medical: Not on file    Non-medical: Not on file  Tobacco Use  . Smoking status: Former Smoker    Packs/day: 0.50    Years: 30.00    Pack years: 15.00    Types: Cigarettes    Last attempt to quit: 05/07/2012    Years since quitting: 6.1  . Smokeless tobacco: Never Used  Substance and Sexual Activity  . Alcohol use: Never    Frequency: Never  . Drug use: Not Currently  . Sexual activity: Not Currently    Birth control/protection: Surgical    Comment: tubal  Lifestyle  . Physical activity:    Days per week: Not on file    Minutes per session: Not on file  . Stress: Not on file  Relationships  . Social connections:    Talks on phone: Not on file    Gets together: Not on file    Attends religious service: Not on file    Active member of club or organization: Not on file    Attends meetings of clubs or organizations: Not on file    Relationship status: Not on file  . Intimate partner violence:    Fear of current or ex partner: Not on file    Emotionally abused: Not on file    Physically abused: Not on file    Forced sexual activity: Not on file  Other Topics Concern  . Not on file  Social History Narrative  . Not on file   Allergies  Allergen Reactions  . Lithium Other (See Comments)    Psoriasis    Family History  Problem Relation Age of Onset  . Anxiety disorder Mother   . Hypertension Mother   . Heart failure Mother   . Stroke Mother   . Hyperlipidemia Father   . Diabetes Father   . Stroke Father   . Hypertension Father   . Colon polyps Father        older than 8  . Diabetes Sister   . Hypertension Sister   . Uterine cancer Sister   . Ovarian cancer Maternal Grandmother   . Colon cancer Maternal Grandmother   . Renal Disease Paternal Grandmother   . Bipolar disorder Other     ROS: Per HPI  Objective: Office vital signs reviewed. BP 129/86   Pulse 91   Temp 97.8 F (36.6 C) (Oral)   Ht  _0  (1.676 m)   Wt 220 lb (99.8 kg)   LMP 12/13/2017 (Approximate)   BMI 35.51 kg/m  Physical Examination:  General: Awake, alert, well nourished, No acute distress HEENT: Normal, sclera white, MMM Cardio: regular rate and rhythm, S1S2 heard, no murmurs appreciated Pulm: clear to auscultation bilaterally, no wheezes, rhonchi or rales; normal work of breathing on room air Extremities: warm, well perfused, No edema, cyanosis or clubbing; +2 pulses bilaterally Psych: mood depressed  Depression screen [facility] 2/9 06/20/2018 05/19/2018 04/16/2018  Decreased Interest _0 Down, Depressed, Hopeless _1 PHQ - 2 Score _2 Altered sleeping _3 Tired, decreased energy _4 Change in appetite 0 1 1  Feeling bad or failure about yourself  1 0 1  Trouble concentrating _5 Moving slowly or fidgety/restless 0 0 0  Suicidal thoughts 0 0 0  PHQ-9 Score _6 Difficult doing work/chores - Not difficult at all Not difficult at all    GAD 7 : Generalized Anxiety Score 04/16/2018  Nervous, Anxious, on Edge 1  Control/stop worrying 0  Worry too much - different things 0  Trouble relaxing 1  Restless 0  Easily annoyed or irritable 1  Afraid - awful might happen 0  Total GAD 7 Score 3  Anxiety Difficulty Not difficult at all    Assessment/ Plan: 53 y.o. femalehere for follow up weight management visit.  1. Morbid obesity ([place]) Weight stable.  She would like to go another month on phentermine.  We discussed food diary, increase physical exercise.  We will see each other back in 1 month  2. Weight loss counseling, encounter for As above  3. Stress at home Seeing therapy soon.  She is on multiple medications prescribed by her psychiatrist.  We will continue to support her from this end as well.  4. Fatty liver Needed by psychiatry - Hepatic function panel  The Narcotic Database has been reviewed.  There were no red flags.     Meds ordered this encounter  Medications  .  phentermine (ADIPEX-P) 37.5 MG tablet    Sig: Take 1 tablet (37.5 mg total) by mouth daily before breakfast.    Dispense:  30 tablet    Refill:  0     Ashly Windell Moulding, DO [place] [phone]

## 2018-06-21 LAB — HEPATIC FUNCTION PANEL
ALT: 30 IU/L (ref 0–32)
AST: 17 IU/L (ref 0–40)
Albumin: 4.6 g/dL (ref 3.8–4.9)
Alkaline Phosphatase: 124 IU/L — ABNORMAL HIGH (ref 39–117)
Bilirubin Total: <0.2 mg/dL (ref 0.0–1.2)
Bilirubin, Direct: 0.08 mg/dL (ref 0.00–0.40)
Total Protein: 6.6 g/dL (ref 6.0–8.5)

## 2018-06-21 LAB — BETA HCG QUANT (REF LAB): hCG Quant: 4 m[IU]/mL

## 2018-06-23 ENCOUNTER — Other Ambulatory Visit: Payer: Self-pay

## 2018-06-23 DIAGNOSIS — R945 Abnormal results of liver function studies: Secondary | ICD-10-CM

## 2018-06-23 DIAGNOSIS — R7989 Other specified abnormal findings of blood chemistry: Secondary | ICD-10-CM

## 2018-07-22 ENCOUNTER — Other Ambulatory Visit: Payer: Self-pay

## 2018-07-22 ENCOUNTER — Encounter: Payer: Self-pay | Admitting: *Deleted

## 2018-07-22 ENCOUNTER — Encounter: Payer: Self-pay | Admitting: Family

## 2018-07-22 ENCOUNTER — Ambulatory Visit (INDEPENDENT_AMBULATORY_CARE_PROVIDER_SITE_OTHER): Payer: Medicare Other | Admitting: Family

## 2018-07-22 ENCOUNTER — Ambulatory Visit (INDEPENDENT_AMBULATORY_CARE_PROVIDER_SITE_OTHER): Payer: Medicare Other | Admitting: *Deleted

## 2018-07-22 VITALS — BP 139/91 | HR 92 | Temp 97.6°F | Ht 66.0 in | Wt 220.0 lb

## 2018-07-22 DIAGNOSIS — N3001 Acute cystitis with hematuria: Secondary | ICD-10-CM | POA: Diagnosis not present

## 2018-07-22 DIAGNOSIS — Z0000 Encounter for general adult medical examination without abnormal findings: Secondary | ICD-10-CM | POA: Diagnosis not present

## 2018-07-22 DIAGNOSIS — R300 Dysuria: Secondary | ICD-10-CM | POA: Diagnosis not present

## 2018-07-22 LAB — MICROSCOPIC EXAMINATION: Renal Epithel, UA: NONE SEEN /hpf

## 2018-07-22 LAB — URINALYSIS, ROUTINE W REFLEX MICROSCOPIC
Bilirubin, UA: NEGATIVE
Glucose, UA: NEGATIVE
Ketones, UA: NEGATIVE
NITRITE UA: POSITIVE — AB
Protein, UA: NEGATIVE
Specific Gravity, UA: 1.01 (ref 1.005–1.030)
Urobilinogen, Ur: 0.2 mg/dL (ref 0.2–1.0)
pH, UA: 6 (ref 5.0–7.5)

## 2018-07-22 MED ORDER — CEPHALEXIN 500 MG PO CAPS: 500.0000 mg | ORAL_CAPSULE | Freq: Two times a day (BID) | ORAL | 0 refills | Status: DC

## 2018-07-22 NOTE — Progress Notes (Signed)
   Subjective:    Patient ID: Rebecca Moreno, female    DOB: 1965/10/05, 53 y.o.   MRN: [id number]  Chief Complaint  Patient presents with  . Cough  . Dysuria    Dysuria   This is a new problem. The current episode started in the past 7 days. The problem occurs every urination. The problem has been unchanged. The quality of the pain is described as burning. The pain is at a severity of 4/10. The pain is mild. There has been no fever. Associated symptoms include frequency and urgency. Pertinent negatives include no discharge, flank pain, hematuria, nausea, possible pregnancy or vomiting. She has tried increased fluids (AZO) for the symptoms. The treatment provided mild relief.      Review of Systems  Gastrointestinal: Negative for nausea and vomiting.  Genitourinary: Positive for dysuria, frequency and urgency. Negative for flank pain and hematuria.  All other systems reviewed and are negative.      Objective:   Physical Exam Vitals signs reviewed.  Constitutional:      General: She is not in acute distress.    Appearance: She is well-developed.  HENT:     Head: Normocephalic and atraumatic.  Eyes:     Pupils: Pupils are equal, round, and reactive to light.  Neck:     Musculoskeletal: Normal range of motion and neck supple.     Thyroid: No thyromegaly.  Cardiovascular:     Rate and Rhythm: Normal rate and regular rhythm.     Heart sounds: Normal heart sounds. No murmur.  Pulmonary:     Effort: Pulmonary effort is normal. No respiratory distress.     Breath sounds: Normal breath sounds. No wheezing.  Abdominal:     General: Bowel sounds are normal. There is no distension.     Palpations: Abdomen is soft.     Tenderness: There is no abdominal tenderness.  Musculoskeletal: Normal range of motion.        General: No tenderness.  Skin:    General: Skin is warm and dry.  Neurological:     Mental Status: She is alert and oriented to person, place, and time.     Cranial  Nerves: No cranial nerve deficit.     Deep Tendon Reflexes: Reflexes are normal and symmetric.  Psychiatric:        Behavior: Behavior normal.        Thought Content: Thought content normal.        Judgment: Judgment normal.       BP (!) 139/91   Pulse 92   Temp 97.6 F (36.4 C)   Ht 5\' 6"  (1.676 m)   Wt 220 lb (99.8 kg)   LMP 12/13/2017 (Approximate)   BMI 35.51 kg/m      Assessment & Plan:  Rebecca Moreno comes in today with chief complaint of Cough and Dysuria   Diagnosis and orders addressed:  1. Dysuria - Urinalysis, Routine w reflex microscopic - Urine Culture  2. Acute cystitis with hematuria Force fluids AZO over the counter X2 days RTO prn Culture pending - cephALEXin (KEFLEX) 500 MG capsule; Take 1 capsule (500 mg total) by mouth 2 (two) times daily.  Dispense: 14 capsule; Refill: 0   Evelina Dun, FNP

## 2018-07-22 NOTE — Progress Notes (Addendum)
Subjective:   Rebecca Moreno is a 53 y.o. female who presents for a Initial Medicare Annual Wellness Visit.  Mrs. Wilcher has been on Medicare disability since 2007.  She enjoys being outdoors and doing word search puzzles.  She lives at home with her husband in a one story home.  She has one adult daughter.  She and her husband moved to [place] state from 2011 until 2018, they moved back to this area in 2019.    Patient Care Team: [name], DO as PCP - General (Family Medicine) Gala Romney Cristopher Estimable, MD as Consulting Physician (Gastroenterology) Norman Clay, MD as Consulting Physician (Psychiatry) Gala Romney Cristopher Estimable, MD as Consulting Physician (Gastroenterology)  Hospitalizations, surgeries, and ER visits in previous 12 months No hospitalizations, ER visits, or surgeries this past year.   Review of Systems    Patient reports that her overall health is better compared to last year because she feels better.  Cardiac Risk Factors include: hypertension;obesity (BMI >30kg/m2);sedentary lifestyle  Respiratory positive for cough  All other systems negative       Current Medications (verified) Outpatient Encounter Medications as of 07/22/2018  Medication Sig  . albuterol (PROVENTIL HFA;VENTOLIN HFA) 108 (90 Base) MCG/ACT inhaler Inhale 1 puff into the lungs every 6 (six) hours as needed for wheezing or shortness of breath.  Marland Kitchen CALCIUM CITRATE-VITAMIN D3 PO Take 1 tablet by mouth daily.  . Cariprazine HCl (VRAYLAR) 6 MG CAPS Take 1 capsule (6 mg total) by mouth daily.  . cephALEXin (KEFLEX) 500 MG capsule Take 1 capsule (500 mg total) by mouth 2 (two) times daily.  . Cholecalciferol (VITAMIN D3) 5000 units TABS Take 5,000 Units by mouth daily.   . clobetasol cream (TEMOVATE) 9.21 % Apply 1 application topically daily as needed (irritation).   . clonazePAM (KLONOPIN) 1 MG tablet Take 1 mg by mouth 2 (two) times daily.   Marland Kitchen Dexlansoprazole (DEXILANT) 30 MG capsule Take 30 mg by mouth  daily.  . famotidine (PEPCID) 20 MG tablet Take 20 mg by mouth daily.  . fluticasone (FLOVENT HFA) 220 MCG/ACT inhaler Inhale 1 puff into the lungs daily.   Marland Kitchen lamoTRIgine (LAMICTAL) 100 MG tablet Take 3 tablets (300 mg total) by mouth at bedtime.  Marland Kitchen levothyroxine (SYNTHROID, LEVOTHROID) 50 MCG tablet Take 50 mcg by mouth daily before breakfast.  . magnesium gluconate (MAGONATE) 500 MG tablet Take 500 mg by mouth daily.  . Multiple Vitamins-Minerals (MULTIVITAMIN WOMEN PO) Take 1 tablet by mouth daily.  . Na Sulfate-K Sulfate-Mg Sulf 17.5-3.13-1.6 GM/177ML SOLN Take 1 kit by mouth as directed.  . naproxen (NAPROSYN) 250 MG tablet Take 250 mg by mouth 2 (two) times daily with a meal.  . phentermine (ADIPEX-P) 37.5 MG tablet Take 1 tablet (37.5 mg total) by mouth daily before breakfast.  . QUEtiapine (SEROQUEL) 100 MG tablet Take 100-300 mg by mouth at bedtime.  Marland Kitchen QUEtiapine (SEROQUEL) 300 MG tablet Take 1 tablet (300 mg total) by mouth at bedtime.  Marland Kitchen QUEtiapine (SEROQUEL) 50 MG tablet Take 25 mg by mouth 2 (two) times daily.  . sucralfate (CARAFATE) 1 g tablet Take 1 g by mouth daily as needed.   . venlafaxine XR (EFFEXOR-XR) 150 MG 24 hr capsule Take 2 capsules (300 mg total) by mouth daily.   No facility-administered encounter medications on file as of 07/22/2018.     Allergies (verified) Lithium   History: Past Medical History:  Diagnosis Date  . Asthma   . Bipolar disorder ([place])   .  Colon polyp   . Depression    Bipolar  . Gastric ulcer    around 2013.  stress related  . GERD (gastroesophageal reflux disease)   . History of kidney stones   . Hyperlipidemia   . Hypothyroid   . Sleep apnea    Past Surgical History:  Procedure Laterality Date  . ABLATION ON ENDOMETRIOSIS    . COLONOSCOPY     Per patient, done around 2013 in [place], had polyp and overdue for follow up.  . COLONOSCOPY WITH PROPOFOL N/A 05/12/2018   Procedure: COLONOSCOPY WITH PROPOFOL;  Surgeon: Daneil Dolin, MD;  Location: [facility];  Service: Endoscopy;  Laterality: N/A;  12:00pm  . OVARIAN CYST SURGERY Left   . POLYPECTOMY  05/12/2018   Procedure: POLYPECTOMY;  Surgeon: Daneil Dolin, MD;  Location: [facility];  Service: Endoscopy;;  polyp at splenic flexure  . TEMPOROMANDIBULAR JOINT SURGERY     9 surgeries  . TUBAL LIGATION     Family History  Problem Relation Age of Onset  . Anxiety disorder Mother   . Hypertension Mother   . Heart failure Mother   . Stroke Mother   . Hyperlipidemia Father   . Diabetes Father   . Stroke Father   . Hypertension Father   . Colon polyps Father        older than 42  . Diabetes Sister   . Hypertension Sister   . Uterine cancer Sister   . Ovarian cancer Maternal Grandmother   . Colon cancer Maternal Grandmother   . Renal Disease Paternal Grandmother   . Bipolar disorder Other    Social History   Socioeconomic History  . Marital status: Married    Spouse name: Not on file  . Number of children: 1  . Years of education: Not on file  . Highest education level: High school graduate  Occupational History  . Occupation: disability  Social Needs  . Financial resource strain: Not hard at all  . Food insecurity:    Worry: Never true    Inability: Never true  . Transportation needs:    Medical: No    Non-medical: No  Tobacco Use  . Smoking status: Former Smoker    Packs/day: 0.50    Years: 30.00    Pack years: 15.00    Types: Cigarettes    Last attempt to quit: 05/07/2012    Years since quitting: 6.2  . Smokeless tobacco: Never Used  Substance and Sexual Activity  . Alcohol use: Never    Frequency: Never  . Drug use: Not Currently  . Sexual activity: Not Currently    Birth control/protection: Surgical    Comment: tubal  Lifestyle  . Physical activity:    Days per week: 0 days    Minutes per session: 0 min  . Stress: To some extent  Relationships  . Social connections:    Talks on phone: More than three times a week     Gets together: More than three times a week    Attends religious service: Never    Active member of club or organization: No    Attends meetings of clubs or organizations: Never    Relationship status: Married  Other Topics Concern  . Not on file  Social History Narrative  . Not on file     Clinical Intake:     Pain Score: 0-No pain  Activities of Daily Living In your present state of health, do you have any difficulty performing the following activities: 07/22/2018 05/06/2018  Hearing? N N  Vision? N N  Difficulty concentrating or making decisions? N N  Comment Trouble concentrating -  Walking or climbing stairs? N N  Dressing or bathing? N N  Doing errands, shopping? N N  Preparing Food and eating ? N -  Using the Toilet? N -  In the past six months, have you accidently leaked urine? N -  Do you have problems with loss of bowel control? N -  Managing your Medications? Y -  Comment Husband helps  -  Managing your Finances? N -  Housekeeping or managing your Housekeeping? N -  Some recent data might be hidden     Exercise Current Exercise Habits: The patient does not participate in regular exercise at present  Diet Consumes 1 meals a day and 2 snacks a day.  The patient feels that she mostly follow a Regular diet.  Diet History  Mrs Sallie states she has been trying to lose weight and only feels hungry for one meal per day in the evenings.  She usually has a salad and other vegetables, and sometimes chicken.  She has popcorn and hard boiled eggs for snacks.  She states she has access to all the food she needs.  Recommended diet of mostly non-starchy vegetables, fruits, lean proteins, and whole grains.  Depression Screen PHQ 2/9 Scores 07/22/2018 06/20/2018 05/19/2018 04/16/2018 03/04/2018 02/06/2018 02/06/2018  PHQ - 2 Score _0 PHQ- 9 Score _1 -     Fall Risk Fall Risk  07/22/2018 03/04/2018  Falls in the past  year? 0 No     Objective:    Today's Vitals   07/22/18 1523  BP: (!) 139/91  Pulse: 92  Temp: 97.6 F (36.4 C)  Weight: 220 lb (99.8 kg)  Height: _2  (1.676 m)  PainSc: 0-No pain   Body mass index is 35.51 kg/m.  Advanced Directives 07/22/2018 05/12/2018 05/06/2018  Does Patient Have a Medical Advance Directive? Yes No No  Type of Paramedic of [place];Living will - -  Does patient want to make changes to medical advance directive? No - Patient declined - -  Copy of [place] in Chart? No - copy requested - -  Would patient like information on creating a medical advance directive? - No - Patient declined No - Patient declined    Hearing/Vision  No hearing or vision deficits noted during visit.  Cognitive Function: MMSE - Mini Mental State Exam 07/22/2018  Orientation to time 5  Orientation to Place 5  Registration 3  Attention/ Calculation 5  Recall 3  Language- name 2 objects 2  Language- repeat 1  Language- follow 3 step command 3  Language- read & follow direction 1  Write a sentence 1  Copy design 1  Total score 30           Immunizations and Health Maintenance Immunization History  Administered Date(s) Administered  . Influenza Inj Mdck Quad Pf 02/03/2018  . Pneumococcal Conjugate-13 01/06/2016  . Pneumococcal Polysaccharide-23 02/13/2018  . Tdap 01/06/2016   Health Maintenance Due  Topic Date Due  . HIV Screening  12/02/1979  . PAP SMEAR-Modifier  12/01/1985  . MAMMOGRAM  12/02/2014   Recommend HIV screening at next visit with Dr. Lajuana Ripple, and discuss whether pap smear is  necessary.  Mammogram scheduled for 09/02/2018.     Health Maintenance  Topic Date Due  . HIV Screening  12/02/1979  . PAP SMEAR-Modifier  12/01/1985  . MAMMOGRAM  12/02/2014  . COLONOSCOPY  05/13/2023  . TETANUS/TDAP  01/05/2026  . INFLUENZA VACCINE  Completed        Assessment:   This is a routine wellness  examination for [place].    Plan:    Goals    . Exercise 150 min/wk Moderate Activity     Walking is a great option.        Health Maintenance & Additional Screening Recommendations  Advanced directives: has an advanced directive - a copy HAS NOT been provided.  Lung: Low Dose CT Chest recommended if Age 110-80 years, 30 pack-year currently smoking OR have quit w/in 15years. Patient does not qualify. Hepatitis C Screening recommended: completed 04/16/2018 HIV Screening recommended: yes    Keep f/u with [name], DO and any other specialty appointments you may have Continue current medications Move carefully to avoid falls.  Aim for at least 150 minutes of moderate activity a week. This can be done with chair exercises if necessary. Read or work on puzzles daily Stay connected with friends and family  I have personally reviewed and noted the following in the patient's chart:   . Medical and social history . Use of alcohol, tobacco or illicit drugs  . Current medications and supplements . Functional ability and status . Nutritional status . Physical activity . Advanced directives . List of other physicians . Hospitalizations, surgeries, and ER visits in previous 12 months . Vitals . Screenings to include cognitive, depression, and falls . Referrals and appointments  In addition, I have reviewed and discussed with patient certain preventive protocols, quality metrics, and best practice recommendations. A written personalized care plan for preventive services as well as general preventive health recommendations were provided to patient.     Nolberto Hanlon, RN  07/22/2018   I have reviewed and agree with the above AWV documentation.   Evelina Dun, FNP

## 2018-07-22 NOTE — Patient Instructions (Signed)
Please work on your goal of increasing your exercise to 5 times per week for about 30 minutes each session.  Walking is a great option.    At your convenience, please bring a copy of your Advance Directives (Healthcare Power of Attorney and Living Will) to our office to be filed in your medical record.  Please follow up with Dr. Lajuana Ripple, and any specialists you may be scheduled with, and for your mammogram as scheduled.  Please consider getting the Shingles vaccine (Shingrix) in the future.  Thank you for coming in for your Annual [employer] [facility] [employer] Visit today!   Preventive Care 40-64 Years, Female Preventive care refers to lifestyle choices and visits with your health care provider that can promote health and wellness. What does preventive care include?   A yearly physical exam. This is also called an annual well check.  Dental exams once or twice a year.  Routine eye exams. Ask your health care provider how often you should have your eyes checked.  Personal lifestyle choices, including: ? Daily care of your teeth and gums. ? Regular physical activity. ? Eating a healthy diet. ? Avoiding tobacco and drug use. ? Limiting alcohol use. ? Practicing safe sex. ? Taking low-dose aspirin daily starting at age 85. ? Taking vitamin and mineral supplements as recommended by your health care provider. What happens during an annual well check? The services and screenings done by your health care provider during your annual well check will depend on your age, overall health, lifestyle risk factors, and family history of disease. Counseling Your health care provider may ask you questions about your:  Alcohol use.  Tobacco use.  Drug use.  Emotional well-being.  Home and relationship well-being.  Sexual activity.  Eating habits.  Work and work Statistician.  Method of birth control.  Menstrual cycle.  Pregnancy history. Screening You may have the following tests or measurements:   Height, weight, and BMI.  Blood pressure.  Lipid and cholesterol levels. These may be checked every 5 years, or more frequently if you are over 70 years old.  Skin check.  Lung cancer screening. You may have this screening every year starting at age 3 if you have a 30-pack-year history of smoking and currently smoke or have quit within the past 15 years.  Colorectal cancer screening. All adults should have this screening starting at age 49 and continuing until age 66. Your health care provider may recommend screening at age 76. You will have tests every 1-10 years, depending on your results and the type of screening test. People at increased risk should start screening at an earlier age. Screening tests may include: ? Guaiac-based fecal occult blood testing. ? Fecal immunochemical test (FIT). ? Stool DNA test. ? Virtual colonoscopy. ? Sigmoidoscopy. During this test, a flexible tube with a tiny camera (sigmoidoscope) is used to examine your rectum and lower colon. The sigmoidoscope is inserted through your anus into your rectum and lower colon. ? Colonoscopy. During this test, a long, thin, flexible tube with a tiny camera (colonoscope) is used to examine your entire colon and rectum.  Hepatitis C blood test.  Hepatitis B blood test.  Sexually transmitted disease (STD) testing.  Diabetes screening. This is done by checking your blood sugar (glucose) after you have not eaten for a while (fasting). You may have this done every 1-3 years.  Mammogram. This may be done every 1-2 years. Talk to your health care provider about when you should start having regular mammograms. This  may depend on whether you have a family history of breast cancer.  BRCA-related cancer screening. This may be done if you have a family history of breast, ovarian, tubal, or peritoneal cancers.  Pelvic exam and Pap test. This may be done every 3 years starting at age 74. Starting at age 53, this may be done every  5 years if you have a Pap test in combination with an HPV test.  Bone density scan. This is done to screen for osteoporosis. You may have this scan if you are at high risk for osteoporosis. Discuss your test results, treatment options, and if necessary, the need for more tests with your health care provider. Vaccines Your health care provider may recommend certain vaccines, such as:  Influenza vaccine. This is recommended every year.  Tetanus, diphtheria, and acellular pertussis (Tdap, Td) vaccine. You may need a Td booster every 10 years.  Varicella vaccine. You may need this if you have not been vaccinated.  Zoster vaccine. You may need this after age 43.  Measles, mumps, and rubella (MMR) vaccine. You may need at least one dose of MMR if you were born in 1957 or later. You may also need a second dose.  Pneumococcal 13-valent conjugate (PCV13) vaccine. You may need this if you have certain conditions and were not previously vaccinated.  Pneumococcal polysaccharide (PPSV23) vaccine. You may need one or two doses if you smoke cigarettes or if you have certain conditions.  Meningococcal vaccine. You may need this if you have certain conditions.  Hepatitis A vaccine. You may need this if you have certain conditions or if you travel or work in places where you may be exposed to hepatitis A.  Hepatitis B vaccine. You may need this if you have certain conditions or if you travel or work in places where you may be exposed to hepatitis B.  Haemophilus influenzae type b (Hib) vaccine. You may need this if you have certain conditions. Talk to your health care provider about which screenings and vaccines you need and how often you need them. This information is not intended to replace advice given to you by your health care provider. Make sure you discuss any questions you have with your health care provider. Document Released: 05/20/2015 Document Revised: 06/13/2017 Document Reviewed: 02/22/2015  Elsevier Interactive Patient Education  2019 [employer].

## 2018-07-22 NOTE — Patient Instructions (Signed)

## 2018-07-24 LAB — URINE CULTURE

## 2018-07-25 ENCOUNTER — Other Ambulatory Visit: Payer: Self-pay | Admitting: Family Medicine

## 2018-07-29 ENCOUNTER — Ambulatory Visit (HOSPITAL_COMMUNITY): Payer: Medicare Other | Admitting: Psychiatry

## 2018-08-05 ENCOUNTER — Other Ambulatory Visit: Payer: Self-pay | Admitting: Family Medicine

## 2018-08-05 NOTE — Telephone Encounter (Signed)
televisit scheduled for 08/06/18 gr

## 2018-08-05 NOTE — Telephone Encounter (Signed)
Please place her on the televisit schedule for tomorrow so we can follow up on this medication before refill since it is controlled.

## 2018-08-06 ENCOUNTER — Encounter: Payer: Self-pay | Admitting: Family Medicine

## 2018-08-06 ENCOUNTER — Ambulatory Visit (INDEPENDENT_AMBULATORY_CARE_PROVIDER_SITE_OTHER): Payer: Medicare Other | Admitting: Family Medicine

## 2018-08-06 ENCOUNTER — Other Ambulatory Visit: Payer: Self-pay

## 2018-08-06 DIAGNOSIS — R519 Headache, unspecified: Secondary | ICD-10-CM

## 2018-08-06 DIAGNOSIS — J301 Allergic rhinitis due to pollen: Secondary | ICD-10-CM

## 2018-08-06 DIAGNOSIS — R51 Headache: Secondary | ICD-10-CM | POA: Diagnosis not present

## 2018-08-06 DIAGNOSIS — Z713 Dietary counseling and surveillance: Secondary | ICD-10-CM | POA: Diagnosis not present

## 2018-08-06 MED ORDER — LORATADINE 10 MG PO TABS: 10.0000 mg | ORAL_TABLET | Freq: Every day | ORAL | 11 refills | Status: DC

## 2018-08-06 MED ORDER — FLUTICASONE PROPIONATE 50 MCG/ACT NA SUSP: 2.0000 | Freq: Every day | NASAL | 6 refills | Status: DC

## 2018-08-06 NOTE — Progress Notes (Signed)
Telephone visit  Subjective: CC: weight management PCP: [name], DO YCX:KGYJE H Rebecca Moreno is a 53 y.o. female calls for telephone consult today. Patient provides verbal consent for consult held via phone.  Location of patient: home Location of provider: [facility] Others present for call: none  1. Obesity Patient was last seen for weight management in February.  She was at that time doing well on phentermine 37.5 mg daily.  She last weighed in at 220 pounds.  Starting weight was 227 pounds.  She follows up today and notes that current weight is 220#.  She feels like she is not losing any more weight.  She notes that she is trying to maintain a healthy diet that is predominately comprised of fish, chicken, greens and water.  She does eat some starchy foods like corn but avoids breads.  She is performing 30 minutes of sit ups, leg lunges, jumping jacks and stretching every other day.  2.  Sinusitis/headache Patient reports a couple day history of increased allergy symptoms and sinus headache.  She reports that this occurs when she is exposed to pollen.  She is not currently taking any allergy medications but has used Claritin and Flonase in the past.  She denies any purulent discharge from the nares, fevers.  ROS: Per HPI  Allergies  Allergen Reactions  . Lithium Other (See Comments)    Psoriasis    Past Medical History:  Diagnosis Date  . Asthma   . Bipolar disorder ([place])   . Colon polyp   . Depression    Bipolar  . Gastric ulcer    around 2013.  stress related  . GERD (gastroesophageal reflux disease)   . History of kidney stones   . Hyperlipidemia   . Hypothyroid   . Sleep apnea     Current Outpatient Medications:  .  albuterol (PROVENTIL HFA;VENTOLIN HFA) 108 (90 Base) MCG/ACT inhaler, Inhale 1 puff into the lungs every 6 (six) hours as needed for wheezing or shortness of breath., Disp: , Rfl:  .  CALCIUM CITRATE-VITAMIN D3 PO, Take 1 tablet by mouth daily., Disp: ,  Rfl:  .  Cariprazine HCl (VRAYLAR) 6 MG CAPS, Take 1 capsule (6 mg total) by mouth daily., Disp: 30 capsule, Rfl: 2 .  cephALEXin (KEFLEX) 500 MG capsule, Take 1 capsule (500 mg total) by mouth 2 (two) times daily., Disp: 14 capsule, Rfl: 0 .  Cholecalciferol (VITAMIN D3) 5000 units TABS, Take 5,000 Units by mouth daily. , Disp: , Rfl:  .  clobetasol cream (TEMOVATE) 5.63 %, Apply 1 application topically daily as needed (irritation). , Disp: , Rfl:  .  clonazePAM (KLONOPIN) 1 MG tablet, Take 1 mg by mouth 2 (two) times daily. , Disp: , Rfl: 2 .  Dexlansoprazole (DEXILANT) 30 MG capsule, Take 30 mg by mouth daily., Disp: , Rfl:  .  famotidine (PEPCID) 20 MG tablet, Take 20 mg by mouth daily., Disp: , Rfl:  .  fluticasone (FLOVENT HFA) 220 MCG/ACT inhaler, Inhale 1 puff into the lungs daily. , Disp: , Rfl:  .  lamoTRIgine (LAMICTAL) 100 MG tablet, Take 3 tablets (300 mg total) by mouth at bedtime., Disp: 90 tablet, Rfl: 2 .  levothyroxine (SYNTHROID, LEVOTHROID) 50 MCG tablet, Take 50 mcg by mouth daily before breakfast., Disp: , Rfl:  .  magnesium gluconate (MAGONATE) 500 MG tablet, Take 500 mg by mouth daily., Disp: , Rfl:  .  Multiple Vitamins-Minerals (MULTIVITAMIN WOMEN PO), Take 1 tablet by mouth daily., Disp: ,  Rfl:  .  Na Sulfate-K Sulfate-Mg Sulf 17.5-3.13-1.6 GM/177ML SOLN, Take 1 kit by mouth as directed., Disp: 1 Bottle, Rfl: 0 .  naproxen (NAPROSYN) 250 MG tablet, Take 250 mg by mouth 2 (two) times daily with a meal., Disp: , Rfl:  .  phentermine (ADIPEX-P) 37.5 MG tablet, Take 1 tablet (37.5 mg total) by mouth daily before breakfast., Disp: 30 tablet, Rfl: 0 .  QUEtiapine (SEROQUEL) 100 MG tablet, Take 100-300 mg by mouth at bedtime., Disp: , Rfl:  .  QUEtiapine (SEROQUEL) 300 MG tablet, Take 1 tablet (300 mg total) by mouth at bedtime., Disp: 90 tablet, Rfl: 2 .  QUEtiapine (SEROQUEL) 50 MG tablet, Take 25 mg by mouth 2 (two) times daily., Disp: , Rfl:  .  sucralfate (CARAFATE) 1 g  tablet, Take 1 g by mouth daily as needed. , Disp: , Rfl:  .  venlafaxine XR (EFFEXOR-XR) 150 MG 24 hr capsule, Take 2 capsules (300 mg total) by mouth daily., Disp: 60 capsule, Rfl: 2  Assessment/ Plan: 53 y.o. female   1. Morbid obesity ([place]) She has plateaued with her weight loss.  We discussed ways to improve weight loss including portion control, low-carb diet, increase physical activity with at least 30 minutes of moderate to high intensity physical activity per day.  Continue to hydrate adequately.  We will take a break from the phentermine since this is not providing any additional weight loss.  We can consider restarting this in about 3 to 4 months if needed.  She is agreeable with plan.  2. Weight loss counseling, encounter for As above  3. Seasonal allergic rhinitis due to pollen At this time, her symptoms do not sound infectious but more allergy mediated.  She responded well to Claritin and Flonase in the past and I have re-prescribe these today.  We discussed reasons for reevaluation including fevers, purulent nasal discharge or worsening symptoms.  She will contact me in 1 week if symptoms are not improving on prescribed medications, at which time we can consider empiric antibiotics for sinus infection. - loratadine (CLARITIN) 10 MG tablet; Take 1 tablet (10 mg total) by mouth daily.  Dispense: 30 tablet; Refill: 11 - fluticasone (FLONASE) 50 MCG/ACT nasal spray; Place 2 sprays into both nostrils daily.  Dispense: 16 g; Refill: 6  4. Sinus headache Rest, hydrate.  Allergy medications as above.   Start time: 1:07pm End time: 1:15pm  Total time spent on patient care (including telephone call/ virtual visit): 12 minutes  [place], [place] [phone]

## 2018-08-06 NOTE — Patient Instructions (Signed)

## 2018-08-11 ENCOUNTER — Ambulatory Visit: Payer: Self-pay | Admitting: Family Medicine

## 2018-08-29 ENCOUNTER — Other Ambulatory Visit: Payer: Self-pay | Admitting: Family Medicine

## 2018-08-29 NOTE — Telephone Encounter (Signed)
Pt was not sure of the names of the medications, a call to the [facility] in [facility], they were not sure which medication she needed refilled at this time either

## 2018-09-01 ENCOUNTER — Inpatient Hospital Stay (HOSPITAL_COMMUNITY)
Admission: EM | Admit: 2018-09-01 | Discharge: 2018-09-04 | DRG: 854 | Disposition: A | Discharge status: Home / Self Care | Payer: Medicare Other | Attending: Family Medicine | Admitting: Family Medicine

## 2018-09-01 ENCOUNTER — Other Ambulatory Visit: Payer: Self-pay

## 2018-09-01 ENCOUNTER — Emergency Department (HOSPITAL_COMMUNITY): Payer: Medicare Other

## 2018-09-01 ENCOUNTER — Observation Stay (HOSPITAL_COMMUNITY): Payer: Medicare Other | Admitting: Certified Registered"

## 2018-09-01 ENCOUNTER — Observation Stay (HOSPITAL_COMMUNITY): Payer: Medicare Other

## 2018-09-01 ENCOUNTER — Encounter (HOSPITAL_COMMUNITY)
Admission: EM | Disposition: A | Discharge status: Home / Self Care | Payer: Self-pay | Source: Home / Self Care | Attending: Family Medicine

## 2018-09-01 ENCOUNTER — Encounter (HOSPITAL_COMMUNITY): Payer: Self-pay | Admitting: *Deleted

## 2018-09-01 ENCOUNTER — Ambulatory Visit (INDEPENDENT_AMBULATORY_CARE_PROVIDER_SITE_OTHER): Payer: Medicare Other | Admitting: Family Medicine

## 2018-09-01 ENCOUNTER — Encounter: Payer: Self-pay | Admitting: Family Medicine

## 2018-09-01 DIAGNOSIS — Z8049 Family history of malignant neoplasm of other genital organs: Secondary | ICD-10-CM | POA: Diagnosis not present

## 2018-09-01 DIAGNOSIS — Z8041 Family history of malignant neoplasm of ovary: Secondary | ICD-10-CM | POA: Diagnosis not present

## 2018-09-01 DIAGNOSIS — N111 Chronic obstructive pyelonephritis: Secondary | ICD-10-CM | POA: Diagnosis not present

## 2018-09-01 DIAGNOSIS — J45909 Unspecified asthma, uncomplicated: Secondary | ICD-10-CM | POA: Diagnosis not present

## 2018-09-01 DIAGNOSIS — N202 Calculus of kidney with calculus of ureter: Secondary | ICD-10-CM | POA: Diagnosis present

## 2018-09-01 DIAGNOSIS — Z8249 Family history of ischemic heart disease and other diseases of the circulatory system: Secondary | ICD-10-CM | POA: Diagnosis not present

## 2018-09-01 DIAGNOSIS — Z823 Family history of stroke: Secondary | ICD-10-CM

## 2018-09-01 DIAGNOSIS — Z800 Family history of malignant neoplasm of digestive organs: Secondary | ICD-10-CM

## 2018-09-01 DIAGNOSIS — G4730 Sleep apnea, unspecified: Secondary | ICD-10-CM | POA: Diagnosis not present

## 2018-09-01 DIAGNOSIS — E039 Hypothyroidism, unspecified: Secondary | ICD-10-CM | POA: Diagnosis not present

## 2018-09-01 DIAGNOSIS — R0902 Hypoxemia: Secondary | ICD-10-CM | POA: Diagnosis present

## 2018-09-01 DIAGNOSIS — N10 Acute tubulo-interstitial nephritis: Secondary | ICD-10-CM

## 2018-09-01 DIAGNOSIS — N136 Pyonephrosis: Secondary | ICD-10-CM | POA: Diagnosis not present

## 2018-09-01 DIAGNOSIS — Z8711 Personal history of peptic ulcer disease: Secondary | ICD-10-CM

## 2018-09-01 DIAGNOSIS — Z833 Family history of diabetes mellitus: Secondary | ICD-10-CM

## 2018-09-01 DIAGNOSIS — N201 Calculus of ureter: Secondary | ICD-10-CM | POA: Diagnosis not present

## 2018-09-01 DIAGNOSIS — Z87891 Personal history of nicotine dependence: Secondary | ICD-10-CM | POA: Diagnosis not present

## 2018-09-01 DIAGNOSIS — J45902 Unspecified asthma with status asthmaticus: Secondary | ICD-10-CM | POA: Diagnosis not present

## 2018-09-01 DIAGNOSIS — R6883 Chills (without fever): Secondary | ICD-10-CM

## 2018-09-01 DIAGNOSIS — K219 Gastro-esophageal reflux disease without esophagitis: Secondary | ICD-10-CM | POA: Diagnosis present

## 2018-09-01 DIAGNOSIS — N132 Hydronephrosis with renal and ureteral calculous obstruction: Secondary | ICD-10-CM | POA: Diagnosis not present

## 2018-09-01 DIAGNOSIS — F3177 Bipolar disorder, in partial remission, most recent episode mixed: Secondary | ICD-10-CM | POA: Diagnosis not present

## 2018-09-01 DIAGNOSIS — G4734 Idiopathic sleep related nonobstructive alveolar hypoventilation: Secondary | ICD-10-CM | POA: Diagnosis not present

## 2018-09-01 DIAGNOSIS — D7589 Other specified diseases of blood and blood-forming organs: Secondary | ICD-10-CM | POA: Diagnosis not present

## 2018-09-01 DIAGNOSIS — K760 Fatty (change of) liver, not elsewhere classified: Secondary | ICD-10-CM | POA: Diagnosis not present

## 2018-09-01 DIAGNOSIS — A4151 Sepsis due to Escherichia coli [E. coli]: Secondary | ICD-10-CM | POA: Diagnosis not present

## 2018-09-01 DIAGNOSIS — Z8349 Family history of other endocrine, nutritional and metabolic diseases: Secondary | ICD-10-CM

## 2018-09-01 DIAGNOSIS — A419 Sepsis, unspecified organism: Secondary | ICD-10-CM | POA: Diagnosis not present

## 2018-09-01 DIAGNOSIS — R109 Unspecified abdominal pain: Secondary | ICD-10-CM

## 2018-09-01 DIAGNOSIS — J9811 Atelectasis: Secondary | ICD-10-CM | POA: Diagnosis present

## 2018-09-01 DIAGNOSIS — Z87442 Personal history of urinary calculi: Secondary | ICD-10-CM

## 2018-09-01 HISTORY — PX: CYSTOSCOPY W/ URETERAL STENT PLACEMENT: SHX1429

## 2018-09-01 LAB — URINALYSIS, ROUTINE W REFLEX MICROSCOPIC
Bilirubin Urine: NEGATIVE
Glucose, UA: NEGATIVE mg/dL
Ketones, ur: NEGATIVE mg/dL
Nitrite: POSITIVE — AB
Protein, ur: NEGATIVE mg/dL
Specific Gravity, Urine: 1.011 (ref 1.005–1.030)
WBC, UA: >50 WBC/hpf — ABNORMAL HIGH (ref 0–5)
pH: 6 (ref 5.0–8.0)

## 2018-09-01 LAB — CBC WITH DIFFERENTIAL/PLATELET
Abs Immature Granulocytes: 0.15 10*3/uL — ABNORMAL HIGH (ref 0.00–0.07)
Basophils Absolute: 0.1 10*3/uL (ref 0.0–0.1)
Basophils Relative: 0 %
Eosinophils Absolute: 0.2 10*3/uL (ref 0.0–0.5)
Eosinophils Relative: 1 %
HCT: 51.3 % — ABNORMAL HIGH (ref 36.0–46.0)
Hemoglobin: 16.9 g/dL — ABNORMAL HIGH (ref 12.0–15.0)
Immature Granulocytes: 1 %
Lymphocytes Relative: 2 %
Lymphs Abs: 0.5 10*3/uL — ABNORMAL LOW (ref 0.7–4.0)
MCH: 35.3 pg — ABNORMAL HIGH (ref 26.0–34.0)
MCHC: 32.9 g/dL (ref 30.0–36.0)
MCV: 107.1 fL — ABNORMAL HIGH (ref 80.0–100.0)
Monocytes Absolute: 0.6 10*3/uL (ref 0.1–1.0)
Monocytes Relative: 3 %
Neutro Abs: 20.9 10*3/uL — ABNORMAL HIGH (ref 1.7–7.7)
Neutrophils Relative %: 93 %
Platelets: 207 10*3/uL (ref 150–400)
RBC: 4.79 MIL/uL (ref 3.87–5.11)
RDW: 14.1 % (ref 11.5–15.5)
WBC: 22.4 10*3/uL — ABNORMAL HIGH (ref 4.0–10.5)
nRBC: 0 % (ref 0.0–0.2)

## 2018-09-01 LAB — COMPREHENSIVE METABOLIC PANEL
ALT: 40 U/L (ref 0–44)
AST: 31 U/L (ref 15–41)
Albumin: 3.9 g/dL (ref 3.5–5.0)
Alkaline Phosphatase: 110 U/L (ref 38–126)
Anion gap: 11 (ref 5–15)
BUN: 12 mg/dL (ref 6–20)
CO2: 26 mmol/L (ref 22–32)
Calcium: 8.6 mg/dL — ABNORMAL LOW (ref 8.9–10.3)
Chloride: 98 mmol/L (ref 98–111)
Creatinine, Ser: 1.08 mg/dL — ABNORMAL HIGH (ref 0.44–1.00)
GFR calc Af Amer: >60 mL/min (ref >60)
GFR calc non Af Amer: 59 mL/min — ABNORMAL LOW (ref >60)
Glucose, Bld: 136 mg/dL — ABNORMAL HIGH (ref 70–99)
Potassium: 3.9 mmol/L (ref 3.5–5.1)
Sodium: 135 mmol/L (ref 135–145)
Total Bilirubin: 0.9 mg/dL (ref 0.3–1.2)
Total Protein: 6.5 g/dL (ref 6.5–8.1)

## 2018-09-01 LAB — LACTIC ACID, PLASMA: Lactic Acid, Venous: 2.4 mmol/L (ref 0.5–1.9)

## 2018-09-01 LAB — LIPASE, BLOOD: Lipase: 26 U/L (ref 11–51)

## 2018-09-01 SURGERY — CYSTOSCOPY, WITH RETROGRADE PYELOGRAM AND URETERAL STENT INSERTION
Anesthesia: General | Site: Ureter | Laterality: Left

## 2018-09-01 MED ORDER — SUCCINYLCHOLINE CHLORIDE 200 MG/10ML IV SOSY
PREFILLED_SYRINGE | INTRAVENOUS | Status: DC | PRN
  Administered 2018-09-01: 120 mg via INTRAVENOUS

## 2018-09-01 MED ORDER — FENTANYL CITRATE (PF) 100 MCG/2ML IJ SOLN: 25.0000 ug | INTRAMUSCULAR | Status: DC | PRN

## 2018-09-01 MED ORDER — SODIUM CHLORIDE 0.9 % IV SOLN
INTRAVENOUS | Status: DC | PRN
  Administered 2018-09-01: 23:00:00 8 mL

## 2018-09-01 MED ORDER — SODIUM CHLORIDE 0.9% FLUSH
3.0000 mL | Freq: Two times a day (BID) | INTRAVENOUS | Status: DC
  Administered 2018-09-02 – 2018-09-04 (×5): 3 mL via INTRAVENOUS

## 2018-09-01 MED ORDER — HYDROMORPHONE HCL 1 MG/ML IJ SOLN: 1.0000 mg | INTRAMUSCULAR | Status: DC | PRN

## 2018-09-01 MED ORDER — ONDANSETRON HCL 4 MG/2ML IJ SOLN: 4.0000 mg | Freq: Four times a day (QID) | INTRAMUSCULAR | Status: DC | PRN

## 2018-09-01 MED ORDER — SODIUM CHLORIDE 0.9 % IV SOLN
1.0000 g | Freq: Once | INTRAVENOUS | Status: AC
  Administered 2018-09-01: 20:00:00 1 g via INTRAVENOUS
  Filled 2018-09-01: qty 10

## 2018-09-01 MED ORDER — ONDANSETRON HCL 4 MG/2ML IJ SOLN: 4.0000 mg | Freq: Once | INTRAMUSCULAR | Status: DC | PRN

## 2018-09-01 MED ORDER — ACETAMINOPHEN 325 MG PO TABS: 650.0000 mg | ORAL_TABLET | Freq: Four times a day (QID) | ORAL | Status: DC | PRN

## 2018-09-01 MED ORDER — LACTATED RINGERS IV BOLUS
1000.0000 mL | Freq: Once | INTRAVENOUS | Status: AC
  Administered 2018-09-01: 18:00:00 1000 mL via INTRAVENOUS

## 2018-09-01 MED ORDER — ALBUTEROL SULFATE (2.5 MG/3ML) 0.083% IN NEBU: 3.0000 mL | INHALATION_SOLUTION | Freq: Four times a day (QID) | RESPIRATORY_TRACT | Status: DC | PRN

## 2018-09-01 MED ORDER — SODIUM CHLORIDE 0.9 % IV SOLN: 2.0000 g | INTRAVENOUS | Status: DC

## 2018-09-01 MED ORDER — LIDOCAINE 2% (20 MG/ML) 5 ML SYRINGE
INTRAMUSCULAR | Status: AC
  Filled 2018-09-01: qty 5

## 2018-09-01 MED ORDER — OXYBUTYNIN CHLORIDE 5 MG PO TABS: 5.0000 mg | ORAL_TABLET | Freq: Three times a day (TID) | ORAL | 1 refills | Status: DC | PRN

## 2018-09-01 MED ORDER — LACTATED RINGERS IV SOLN
INTRAVENOUS | Status: DC | PRN
  Administered 2018-09-01: 23:00:00 via INTRAVENOUS

## 2018-09-01 MED ORDER — FLUTICASONE PROPIONATE HFA 220 MCG/ACT IN AERO: 1.0000 | INHALATION_SPRAY | Freq: Every day | RESPIRATORY_TRACT | Status: DC

## 2018-09-01 MED ORDER — ONDANSETRON HCL 4 MG/2ML IJ SOLN
4.0000 mg | Freq: Once | INTRAMUSCULAR | Status: AC | PRN
  Administered 2018-09-01: 18:00:00 4 mg via INTRAVENOUS
  Filled 2018-09-01: qty 2

## 2018-09-01 MED ORDER — ONDANSETRON HCL 4 MG/2ML IJ SOLN
INTRAMUSCULAR | Status: AC
  Filled 2018-09-01: qty 2

## 2018-09-01 MED ORDER — SUCCINYLCHOLINE CHLORIDE 200 MG/10ML IV SOSY
PREFILLED_SYRINGE | INTRAVENOUS | Status: AC
  Filled 2018-09-01: qty 10

## 2018-09-01 MED ORDER — HYDROMORPHONE HCL 1 MG/ML IJ SOLN
1.0000 mg | Freq: Once | INTRAMUSCULAR | Status: AC
  Administered 2018-09-01: 1 mg via INTRAVENOUS
  Filled 2018-09-01: qty 1

## 2018-09-01 MED ORDER — LIDOCAINE 2% (20 MG/ML) 5 ML SYRINGE
INTRAMUSCULAR | Status: DC | PRN
  Administered 2018-09-01: 50 mg via INTRAVENOUS

## 2018-09-01 MED ORDER — FENTANYL CITRATE (PF) 250 MCG/5ML IJ SOLN
INTRAMUSCULAR | Status: DC | PRN
  Administered 2018-09-01: 50 ug via INTRAVENOUS

## 2018-09-01 MED ORDER — OXYCODONE HCL 5 MG/5ML PO SOLN: 5.0000 mg | Freq: Once | ORAL | Status: DC | PRN

## 2018-09-01 MED ORDER — SODIUM CHLORIDE 0.9 % IR SOLN
Status: DC | PRN
  Administered 2018-09-01: 3000 mL

## 2018-09-01 MED ORDER — ACETAMINOPHEN 650 MG RE SUPP: 650.0000 mg | Freq: Four times a day (QID) | RECTAL | Status: DC | PRN

## 2018-09-01 MED ORDER — DEXAMETHASONE SODIUM PHOSPHATE 10 MG/ML IJ SOLN
INTRAMUSCULAR | Status: DC | PRN
  Administered 2018-09-01: 4 mg via INTRAVENOUS

## 2018-09-01 MED ORDER — POLYETHYLENE GLYCOL 3350 17 G PO PACK: 17.0000 g | PACK | Freq: Every day | ORAL | Status: DC | PRN

## 2018-09-01 MED ORDER — MIDAZOLAM HCL 2 MG/2ML IJ SOLN
INTRAMUSCULAR | Status: DC | PRN
  Administered 2018-09-01: 2 mg via INTRAVENOUS

## 2018-09-01 MED ORDER — ONDANSETRON HCL 4 MG/2ML IJ SOLN
INTRAMUSCULAR | Status: DC | PRN
  Administered 2018-09-01: 4 mg via INTRAVENOUS

## 2018-09-01 MED ORDER — LACTATED RINGERS IV BOLUS
1000.0000 mL | Freq: Once | INTRAVENOUS | Status: AC
  Administered 2018-09-01: 20:00:00 1000 mL via INTRAVENOUS

## 2018-09-01 MED ORDER — BUDESONIDE 0.25 MG/2ML IN SUSP
0.2500 mg | Freq: Two times a day (BID) | RESPIRATORY_TRACT | Status: DC
  Administered 2018-09-02 – 2018-09-04 (×5): 0.25 mg via RESPIRATORY_TRACT
  Filled 2018-09-01 (×5): qty 2

## 2018-09-01 MED ORDER — PHENAZOPYRIDINE HCL 200 MG PO TABS: 200.0000 mg | ORAL_TABLET | Freq: Three times a day (TID) | ORAL | 0 refills | Status: DC | PRN

## 2018-09-01 MED ORDER — OXYCODONE HCL 5 MG PO TABS: 5.0000 mg | ORAL_TABLET | Freq: Once | ORAL | Status: DC | PRN

## 2018-09-01 MED ORDER — TRAMADOL HCL 50 MG PO TABS: 50.0000 mg | ORAL_TABLET | Freq: Four times a day (QID) | ORAL | 0 refills | Status: AC | PRN

## 2018-09-01 MED ORDER — ONDANSETRON HCL 4 MG PO TABS: 4.0000 mg | ORAL_TABLET | Freq: Four times a day (QID) | ORAL | Status: DC | PRN

## 2018-09-01 MED ORDER — SODIUM CHLORIDE 0.9 % IV SOLN
INTRAVENOUS | Status: AC
  Administered 2018-09-02: 02:00:00 via INTRAVENOUS

## 2018-09-01 MED ORDER — ACETAMINOPHEN 500 MG PO TABS
1000.0000 mg | ORAL_TABLET | Freq: Once | ORAL | Status: AC
  Administered 2018-09-01: 1000 mg via ORAL
  Filled 2018-09-01: qty 2

## 2018-09-01 MED ORDER — WATER FOR IRRIGATION, STERILE IR SOLN
Status: DC | PRN
  Administered 2018-09-01: 10 mL

## 2018-09-01 MED ORDER — DEXAMETHASONE SODIUM PHOSPHATE 10 MG/ML IJ SOLN
INTRAMUSCULAR | Status: AC
  Filled 2018-09-01: qty 1

## 2018-09-01 MED ORDER — HYDROCODONE-ACETAMINOPHEN 5-325 MG PO TABS
1.0000 | ORAL_TABLET | ORAL | Status: DC | PRN
  Administered 2018-09-02 (×2): 2 via ORAL
  Administered 2018-09-03 – 2018-09-04 (×4): 1 via ORAL
  Filled 2018-09-01 (×3): qty 2
  Filled 2018-09-01 (×3): qty 1

## 2018-09-01 MED ORDER — PROPOFOL 10 MG/ML IV BOLUS
INTRAVENOUS | Status: DC | PRN
  Administered 2018-09-01: 150 mg via INTRAVENOUS

## 2018-09-01 SURGICAL SUPPLY — 22 items
BAG URINE DRAINAGE (UROLOGICAL SUPPLIES) ×3 IMPLANT
BAG URO CATCHER STRL LF (MISCELLANEOUS) ×3 IMPLANT
CATH FOLEY 2WAY SLVR  5CC 18FR (CATHETERS) ×2
CATH FOLEY 2WAY SLVR 5CC 18FR (CATHETERS) ×1 IMPLANT
CATH URET 5FR 28IN OPEN ENDED (CATHETERS) ×3 IMPLANT
CLOTH BEACON ORANGE TIMEOUT ST (SAFETY) ×3 IMPLANT
GLOVE BIOGEL M STRL SZ7.5 (GLOVE) ×3 IMPLANT
GLOVE BIOGEL PI IND STRL 8 (GLOVE) ×1 IMPLANT
GLOVE BIOGEL PI INDICATOR 8 (GLOVE) ×2
GLOVE SURG SS PI 8.0 STRL IVOR (GLOVE) ×3 IMPLANT
GOWN STRL REUS W/ TWL XL LVL3 (GOWN DISPOSABLE) ×1 IMPLANT
GOWN STRL REUS W/TWL LRG LVL3 (GOWN DISPOSABLE) ×6 IMPLANT
GOWN STRL REUS W/TWL XL LVL3 (GOWN DISPOSABLE) ×2
GUIDEWIRE STR DUAL SENSOR (WIRE) IMPLANT
GUIDEWIRE ZIPWRE .038 STRAIGHT (WIRE) ×3 IMPLANT
KIT TURNOVER KIT A (KITS) IMPLANT
MANIFOLD NEPTUNE II (INSTRUMENTS) ×3 IMPLANT
PACK CYSTO (CUSTOM PROCEDURE TRAY) ×3 IMPLANT
STENT URET 6FRX24 CONTOUR (STENTS) ×3 IMPLANT
TUBING CONNECTING 10 (TUBING) ×2 IMPLANT
TUBING CONNECTING 10' (TUBING) ×1
TUBING UROLOGY SET (TUBING) IMPLANT

## 2018-09-01 NOTE — Progress Notes (Signed)
Virtual Visit via telephone Note Due to COVID-19, visit is conducted virtually and was requested by patient. This visit type was conducted due to national recommendations for restrictions regarding the COVID-19 Pandemic (e.g. social distancing) in an effort to limit this patient's exposure and mitigate transmission in our community.  Due to her co-morbid illnesses, this patient is at least at moderate risk for complications without adequate follow up.  This format is felt to be most appropriate for this patient at this time.  All issues noted in this document were discussed and addressed.  A physical exam was not performed with this format.   I connected with [name] on 09/01/18 at 1620 by telephone and verified that I am speaking with the correct person using two identifiers. KAELEN BRENNAN is currently located in her car and husband is currently with them during visit. The provider, Monia Pouch, FNP is located in their office at time of visit.  I discussed the limitations, risks, security and privacy concerns of performing an evaluation and management service by telephone and the availability of in person appointments. I also discussed with the patient that there may be a patient responsible charge related to this service. The patient expressed understanding and agreed to proceed.  Subjective:  Patient ID: Rebecca Moreno, female    DOB: December 15, 1965, 53 y.o.   MRN: [id number]  Chief Complaint:  Flank Pain and Chills   HPI: RANDE DARIO is a 53 y.o. female presenting on 09/01/2018 for Flank Pain and Chills   Pt reports 10/10 left flank pain that started abruptly this afternoon. States the pain is sharp and radiates to her abdomen. She has chills, nausea, and vomiting with the pain. Known history of kidney stones. Denies decreased urination or hematuria. No weakness or confusion.    Relevant past medical, surgical, family, and social history reviewed and updated as indicated.   Allergies and medications reviewed and updated.   Past Medical History:  Diagnosis Date  . Asthma   . Bipolar disorder ([place])   . Colon polyp   . Depression    Bipolar  . Gastric ulcer    around 2013.  stress related  . GERD (gastroesophageal reflux disease)   . History of kidney stones   . Hyperlipidemia   . Hypothyroid   . Sleep apnea     Past Surgical History:  Procedure Laterality Date  . ABLATION ON ENDOMETRIOSIS    . COLONOSCOPY     Per patient, done around 2013 in [place], had polyp and overdue for follow up.  . COLONOSCOPY WITH PROPOFOL N/A 05/12/2018   Procedure: COLONOSCOPY WITH PROPOFOL;  Surgeon: Daneil Dolin, MD;  [facility]: [facility];  Service: Endoscopy;  Laterality: N/A;  12:00pm  . OVARIAN CYST SURGERY Left   . POLYPECTOMY  05/12/2018   Procedure: POLYPECTOMY;  Surgeon: Daneil Dolin, MD;  Location: [facility];  Service: Endoscopy;;  polyp at splenic flexure  . TEMPOROMANDIBULAR JOINT SURGERY     9 surgeries  . TUBAL LIGATION      Social History   Socioeconomic History  . Marital status: Married    Spouse name: Not on file  . Number of children: 1  . Years of education: Not on file  . Highest education level: High school graduate  Occupational History  . Occupation: disability  Social Needs  . Financial resource strain: Not hard at all  . Food insecurity:    Worry: Never true  Inability: Never true  . Transportation needs:    Medical: No    Non-medical: No  Tobacco Use  . Smoking status: Former Smoker    Packs/day: 0.50    Years: 30.00    Pack years: 15.00    Types: Cigarettes    Last attempt to quit: 05/07/2012    Years since quitting: 6.3  . Smokeless tobacco: Never Used  Substance and Sexual Activity  . Alcohol use: Never    Frequency: Never  . Drug use: Not Currently  . Sexual activity: Not Currently    Birth control/protection: Surgical    Comment: tubal  Lifestyle  . Physical activity:    Days per week: 0 days     Minutes per session: 0 min  . Stress: To some extent  Relationships  . Social connections:    Talks on phone: More than three times a week    Gets together: More than three times a week    Attends religious service: Never    Active member of club or organization: No    Attends meetings of clubs or organizations: Never    Relationship status: Married  . Intimate partner violence:    Fear of current or ex partner: No    Emotionally abused: No    Physically abused: No    Forced sexual activity: No  Other Topics Concern  . Not on file  Social History Narrative  . Not on file    Outpatient Encounter Medications as of 09/01/2018  Medication Sig  . albuterol (PROVENTIL HFA;VENTOLIN HFA) 108 (90 Base) MCG/ACT inhaler Inhale 1 puff into the lungs every 6 (six) hours as needed for wheezing or shortness of breath.  Marland Kitchen CALCIUM CITRATE-VITAMIN D3 PO Take 1 tablet by mouth daily.  . Cariprazine HCl (VRAYLAR) 6 MG CAPS Take 1 capsule (6 mg total) by mouth daily.  . Cholecalciferol (VITAMIN D3) 5000 units TABS Take 5,000 Units by mouth daily.   . clobetasol cream (TEMOVATE) 4.01 % Apply 1 application topically daily as needed (irritation).   . clonazePAM (KLONOPIN) 1 MG tablet Take 1 mg by mouth 2 (two) times daily.   Marland Kitchen Dexlansoprazole (DEXILANT) 30 MG capsule Take 30 mg by mouth daily.  . famotidine (PEPCID) 20 MG tablet Take 20 mg by mouth daily.  . fluticasone (FLONASE) 50 MCG/ACT nasal spray Place 2 sprays into both nostrils daily.  . fluticasone (FLOVENT HFA) 220 MCG/ACT inhaler Inhale 1 puff into the lungs daily.   Marland Kitchen lamoTRIgine (LAMICTAL) 100 MG tablet Take 3 tablets (300 mg total) by mouth at bedtime.  Marland Kitchen levothyroxine (SYNTHROID, LEVOTHROID) 50 MCG tablet Take 50 mcg by mouth daily before breakfast.  . loratadine (CLARITIN) 10 MG tablet Take 1 tablet (10 mg total) by mouth daily.  . magnesium gluconate (MAGONATE) 500 MG tablet Take 500 mg by mouth daily.  . Multiple Vitamins-Minerals  (MULTIVITAMIN WOMEN PO) Take 1 tablet by mouth daily.  . Na Sulfate-K Sulfate-Mg Sulf 17.5-3.13-1.6 GM/177ML SOLN Take 1 kit by mouth as directed.  . naproxen (NAPROSYN) 250 MG tablet Take 250 mg by mouth 2 (two) times daily with a meal.  . QUEtiapine (SEROQUEL) 100 MG tablet Take 100-300 mg by mouth at bedtime.  Marland Kitchen QUEtiapine (SEROQUEL) 300 MG tablet Take 1 tablet (300 mg total) by mouth at bedtime.  Marland Kitchen QUEtiapine (SEROQUEL) 50 MG tablet Take 25 mg by mouth 2 (two) times daily.  . sucralfate (CARAFATE) 1 g tablet Take 1 g by mouth daily as needed.   Marland Kitchen  venlafaxine XR (EFFEXOR-XR) 150 MG 24 hr capsule Take 2 capsules (300 mg total) by mouth daily.   No facility-administered encounter medications on file as of 09/01/2018.     Allergies  Allergen Reactions  . Lithium Other (See Comments)    Psoriasis     Review of Systems  Constitutional: Positive for chills. Negative for diaphoresis, fatigue and fever.  Respiratory: Positive for cough. Negative for shortness of breath.   Cardiovascular: Negative for chest pain and palpitations.  Gastrointestinal: Positive for abdominal pain, nausea and vomiting. Negative for diarrhea.  Genitourinary: Positive for flank pain. Negative for decreased urine volume, difficulty urinating, dysuria and hematuria.  Neurological: Negative for weakness.  Psychiatric/Behavioral: Negative for confusion.  All other systems reviewed and are negative.        Observations/Objective: No vital signs or physical exam, this was a telephone or virtual health encounter.  Pt alert and oriented, answers all questions appropriately, and able to speak in full sentences.    Assessment and Plan: Chyrl was seen today for flank pain and chills.  Diagnoses and all orders for this visit:  Left flank pain Chills History of kidney stones Due to severity of pain and history of kidney stones, pt was referred to ED for evaluation and treatment.     Follow Up Instructions:  Return if symptoms worsen or fail to improve.    I discussed the assessment and treatment plan with the patient. The patient was provided an opportunity to ask questions and all were answered. The patient agreed with the plan and demonstrated an understanding of the instructions.   The patient was advised to call back or seek an in-person evaluation if the symptoms worsen or if the condition fails to improve as anticipated.  The above assessment and management plan was discussed with the patient. The patient verbalized understanding of and has agreed to the management plan. Patient is aware to call the clinic if symptoms persist or worsen. Patient is aware when to return to the clinic for a follow-up visit. Patient educated on when it is appropriate to go to the emergency department.    I provided 15 minutes of non-face-to-face time during this encounter. The call started at 1620. The call ended at 1635.   Monia Pouch, FNP-C [facility] [address] [place], [place] [address] [phone]

## 2018-09-01 NOTE — Transfer of Care (Signed)
Immediate Anesthesia Transfer of Care Note  Patient: Rebecca Moreno  Procedure(s) Performed: CYSTOSCOPY WITH RETROGRADE PYELOGRAM/URETERAL STENT PLACEMENT (Left Ureter)  Patient Location: PACU  Anesthesia Type:General  Level of Consciousness: awake, alert  and oriented  Airway & Oxygen Therapy: Patient Spontanous Breathing and Patient connected to nasal cannula oxygen  Post-op Assessment: Report given to RN and Post -op Vital signs reviewed and stable  Post vital signs: Reviewed and stable  Last Vitals:  Vitals Value Taken Time  BP 133/70 09/01/2018 11:42 PM  Temp    Pulse 112 09/01/2018 11:42 PM  Resp 31 09/01/2018 11:42 PM  SpO2 92 % 09/01/2018 11:42 PM  Vitals shown include unvalidated device data.  Last Pain:  Vitals:   09/01/18 2157  TempSrc: Oral  PainSc:          Complications: No apparent anesthesia complications

## 2018-09-01 NOTE — Anesthesia Preprocedure Evaluation (Addendum)
Anesthesia Evaluation  Patient identified by MRN, date of birth, ID band Patient awake    Reviewed: Allergy & Precautions, NPO status , Patient's Chart, lab work & pertinent test results  History of Anesthesia Complications Negative for: history of anesthetic complications  Airway Mallampati: III  TM Distance: >3 FB Neck ROM: Full    Dental no notable dental hx. (+) Teeth Intact   Pulmonary asthma , sleep apnea , former smoker,    Pulmonary exam normal        Cardiovascular negative cardio ROS Normal cardiovascular exam     Neuro/Psych PSYCHIATRIC DISORDERS Depression Bipolar Disorder negative neurological ROS     GI/Hepatic Neg liver ROS, PUD, GERD  ,  Endo/Other  Hypothyroidism   Renal/GU Renal disease (obstructive pyelo)  negative genitourinary   Musculoskeletal negative musculoskeletal ROS (+)   Abdominal   Peds  Hematology negative hematology ROS (+)   Anesthesia Other Findings   Reproductive/Obstetrics                            Anesthesia Physical Anesthesia Plan  ASA: III and emergent  Anesthesia Plan: General   Post-op Pain Management:    Induction: Intravenous and Rapid sequence  PONV Risk Score and Plan: 3 and Ondansetron, Dexamethasone, Midazolam and Treatment may vary due to age or medical condition  Airway Management Planned: Oral ETT  Additional Equipment: None  Intra-op Plan:   Post-operative Plan: Extubation in OR  Informed Consent: I have reviewed the patients History and Physical, chart, labs and discussed the procedure including the risks, benefits and alternatives for the proposed anesthesia with the patient or authorized representative who has indicated his/her understanding and acceptance.     Dental advisory given  Plan Discussed with:   Anesthesia Plan Comments:        Anesthesia Quick Evaluation

## 2018-09-01 NOTE — Consult Note (Signed)
Urology Consult   Physician requesting consult: Sherwood Gambler, MD  Reason for consult: Left ureteral stone with UTI  History of Present Illness: Rebecca Moreno is a 53 y.o. female with a 24 hour history of worsening, intermittent, non-radiating left flank pain associated with a fever of 102F and nausea/vomiting.  CTSS from 09/01/18 demonstrates a 4 mm left distal ureteral stone with moderate hydronephrosis and perinephric stranding.   She has a history of kidney stones, but passed all of them w/o needing surgery.  She is voiding w/o difficulty and denies dysuria or hematuria this evening.    Past Medical History:  Diagnosis Date  . Asthma   . Bipolar disorder ([place])   . Colon polyp   . Depression    Bipolar  . Gastric ulcer    around 2013.  stress related  . GERD (gastroesophageal reflux disease)   . History of kidney stones   . Hyperlipidemia   . Hypothyroid   . Sleep apnea     Past Surgical History:  Procedure Laterality Date  . ABLATION ON ENDOMETRIOSIS    . COLONOSCOPY     Per patient, done around 2013 in [place], had polyp and overdue for follow up.  . COLONOSCOPY WITH PROPOFOL N/A 05/12/2018   Procedure: COLONOSCOPY WITH PROPOFOL;  Surgeon: Daneil Dolin, MD;  Location: [facility];  Service: Endoscopy;  Laterality: N/A;  12:00pm  . OVARIAN CYST SURGERY Left   . POLYPECTOMY  05/12/2018   Procedure: POLYPECTOMY;  Surgeon: Daneil Dolin, MD;  Location: [facility];  Service: Endoscopy;;  polyp at splenic flexure  . TEMPOROMANDIBULAR JOINT SURGERY     9 surgeries  . TUBAL LIGATION      Current Hospital Medications:  Home Meds:  No outpatient medications have been marked as taking for the 09/01/18 encounter [facility] Encounter).    Scheduled Meds: . budesonide (PULMICORT) nebulizer solution  0.25 mg Nebulization BID  . sodium chloride flush  3 mL Intravenous Q12H   Continuous Infusions: . sodium chloride    . [START ON 09/02/2018] cefTRIAXone (ROCEPHIN)  IV      PRN Meds:.acetaminophen **OR** acetaminophen, albuterol, HYDROcodone-acetaminophen, HYDROmorphone (DILAUDID) injection, ondansetron **OR** ondansetron (ZOFRAN) IV, polyethylene glycol  Allergies:  Allergies  Allergen Reactions  . Lithium Other (See Comments)    Psoriasis     Family History  Problem Relation Age of Onset  . Anxiety disorder Mother   . Hypertension Mother   . Heart failure Mother   . Stroke Mother   . Hyperlipidemia Father   . Diabetes Father   . Stroke Father   . Hypertension Father   . Colon polyps Father        older than 35  . Diabetes Sister   . Hypertension Sister   . Uterine cancer Sister   . Ovarian cancer Maternal Grandmother   . Colon cancer Maternal Grandmother   . Renal Disease Paternal Grandmother   . Bipolar disorder Other     Social History:  reports that she quit smoking about 6 years ago. Her smoking use included cigarettes. She has a 15.00 pack-year smoking history. She has never used smokeless tobacco. She reports previous drug use. She reports that she does not drink alcohol.  ROS: A complete review of systems was performed.  All systems are negative except for pertinent findings as noted.  Physical Exam:  Vital signs in last 24 hours: Temp:  [98.4 F (36.9 C)-102.3 F (39.1 C)] 99.2 F (37.3 C) (04/27 2157)  Pulse Rate:  [106-122] 106 (04/27 2157) Resp:  [16-17] 16 (04/27 2157) BP: (97-160)/(64-84) 97/69 (04/27 2157) SpO2:  [92 %-99 %] 94 % (04/27 2157) Weight:  [99.8 kg] 99.8 kg (04/27 1659) Constitutional:  Alert and oriented, No acute distress Cardiovascular: Regular rate and rhythm, No JVD Respiratory: Normal respiratory effort, Lungs clear bilaterally GI: Abdomen is soft, nontender, nondistended, no abdominal masses GU: No CVA tenderness Lymphatic: No lymphadenopathy Neurologic: Grossly intact, no focal deficits Psychiatric: Normal mood and affect  Laboratory Data:  Recent Labs    09/01/18 1752  WBC 22.4*  HGB  16.9*  HCT 51.3*  PLT 207    Recent Labs    09/01/18 1752  NA 135  K 3.9  CL 98  GLUCOSE 136*  BUN 12  CALCIUM 8.6*  CREATININE 1.08*     Results for orders placed or performed during the hospital encounter of 09/01/18 (from the past 24 hour(s))  Urinalysis, Routine w reflex microscopic     Status: Abnormal   Collection Time: 09/01/18  5:24 PM  Result Value Ref Range   Color, Urine YELLOW YELLOW   APPearance CLOUDY (A) CLEAR   Specific Gravity, Urine 1.011 1.005 - 1.030   pH 6.0 5.0 - 8.0   Glucose, UA NEGATIVE NEGATIVE mg/dL   Hgb urine dipstick MODERATE (A) NEGATIVE   Bilirubin Urine NEGATIVE NEGATIVE   Ketones, ur NEGATIVE NEGATIVE mg/dL   Protein, ur NEGATIVE NEGATIVE mg/dL   Nitrite POSITIVE (A) NEGATIVE   Leukocytes,Ua LARGE (A) NEGATIVE   RBC / HPF 11-20 0 - 5 RBC/hpf   WBC, UA >50 (H) 0 - 5 WBC/hpf   Bacteria, UA RARE (A) NONE SEEN   Squamous Epithelial / LPF 11-20 0 - 5   Mucus PRESENT    Budding Yeast PRESENT   Comprehensive metabolic panel     Status: Abnormal   Collection Time: 09/01/18  5:52 PM  Result Value Ref Range   Sodium 135 135 - 145 mmol/L   Potassium 3.9 3.5 - 5.1 mmol/L   Chloride 98 98 - 111 mmol/L   CO2 26 22 - 32 mmol/L   Glucose, Bld 136 (H) 70 - 99 mg/dL   BUN 12 6 - 20 mg/dL   Creatinine, Ser 1.08 (H) 0.44 - 1.00 mg/dL   Calcium 8.6 (L) 8.9 - 10.3 mg/dL   Total Protein 6.5 6.5 - 8.1 g/dL   Albumin 3.9 3.5 - 5.0 g/dL   AST 31 15 - 41 U/L   ALT 40 0 - 44 U/L   Alkaline Phosphatase 110 38 - 126 U/L   Total Bilirubin 0.9 0.3 - 1.2 mg/dL   GFR calc non Af Amer 59 (L) >60 mL/min   GFR calc Af Amer >60 >60 mL/min   Anion gap 11 5 - 15  CBC with Differential     Status: Abnormal   Collection Time: 09/01/18  5:52 PM  Result Value Ref Range   WBC 22.4 (H) 4.0 - 10.5 K/uL   RBC 4.79 3.87 - 5.11 MIL/uL   Hemoglobin 16.9 (H) 12.0 - 15.0 g/dL   HCT 51.3 (H) 36.0 - 46.0 %   MCV 107.1 (H) 80.0 - 100.0 fL   MCH 35.3 (H) 26.0 - 34.0 pg    MCHC 32.9 30.0 - 36.0 g/dL   RDW 14.1 11.5 - 15.5 %   Platelets 207 150 - 400 K/uL   nRBC 0.0 0.0 - 0.2 %   Neutrophils Relative % 93 %   Neutro Abs  20.9 (H) 1.7 - 7.7 K/uL   Lymphocytes Relative 2 %   Lymphs Abs 0.5 (L) 0.7 - 4.0 K/uL   Monocytes Relative 3 %   Monocytes Absolute 0.6 0.1 - 1.0 K/uL   Eosinophils Relative 1 %   Eosinophils Absolute 0.2 0.0 - 0.5 K/uL   Basophils Relative 0 %   Basophils Absolute 0.1 0.0 - 0.1 K/uL   Immature Granulocytes 1 %   Abs Immature Granulocytes 0.15 (H) 0.00 - 0.07 K/uL  Lipase, blood     Status: None   Collection Time: 09/01/18  5:52 PM  Result Value Ref Range   Lipase 26 11 - 51 U/L  Culture, blood (x 2)     Status: None (Preliminary result)   Collection Time: 09/01/18  9:41 PM  Result Value Ref Range   Specimen Description RIGHT ANTECUBITAL    Special Requests      BOTTLES DRAWN AEROBIC AND ANAEROBIC Blood Culture adequate volume Performed at [facility], [address] [place], [place] [address]    Culture PENDING    Report Status PENDING    Recent Results (from the past 240 hour(s))  Culture, blood (x 2)     Status: None (Preliminary result)   Collection Time: 09/01/18  9:41 PM  Result Value Ref Range Status   Specimen Description RIGHT ANTECUBITAL  Final   Special Requests   Final    BOTTLES DRAWN AEROBIC AND ANAEROBIC Blood Culture adequate volume Performed at [facility], [address] [place], [place] [address]    Culture PENDING  Incomplete   Report Status PENDING  Incomplete    Renal Function: Recent Labs    09/01/18 1752  CREATININE 1.08*   Estimated Creatinine Clearance: 71.8 mL/min (A) (by C-G formula based on SCr of 1.08 mg/dL (H)).  Radiologic Imaging: Ct Renal Stone Study  Result Date: 09/01/2018 CLINICAL DATA:  Left flank pain and vomiting beginning today. Nephrolithiasis. EXAM: CT ABDOMEN AND PELVIS WITHOUT CONTRAST TECHNIQUE: Multidetector CT imaging of the abdomen and pelvis was performed  following the standard protocol without IV contrast. COMPARISON:  None. FINDINGS: Lower chest: No acute findings. Hepatobiliary: No mass visualized on this unenhanced exam. Severe hepatic steatosis is seen with focal areas of fatty sparing adjacent to the gallbladder fossa. Gallbladder is unremarkable. No evidence of biliary ductal dilatation. Pancreas: No mass or inflammatory process visualized on this unenhanced exam. Spleen:  Within normal limits in size. Adrenals/Urinary tract: Mild left hydroureteronephrosis is seen due to a 4 mm calculus in the mid left ureter. A few tiny 2-3 mm left renal calculi are also noted. Unremarkable unopacified urinary bladder. Stomach/Bowel: No evidence of obstruction, inflammatory process, or abnormal fluid collections. Normal appendix visualized. Vascular/Lymphatic: No pathologically enlarged lymph nodes identified. No evidence of abdominal aortic aneurysm. Aortic atherosclerosis. Reproductive:  No mass or other significant abnormality. Other:  None. Musculoskeletal:  No suspicious bone lesions identified. IMPRESSION: 1. Mild left hydroureteronephrosis due to 4 mm mid left ureteral calculus. 2. Tiny left renal calculi. 3. Severe hepatic steatosis. Aortic Atherosclerosis (ICD10-I70.0). Electronically Signed   By: Earle Gell M.D.   On: 09/01/2018 18:21    I independently reviewed the above imaging studies.  Impression/Recommendation Urosepsis secondary to an obstructing 4 mm left distal ureteral calculus  -The risks, benefits and alternatives of cystoscopy with LEFT JJ stent placement was discussed with the patient.  Risks include, but are not limited to: bleeding, urinary tract infection, ureteral injury, ureteral stricture disease, chronic pain, urinary symptoms, bladder injury,  stent migration, the need for nephrostomy tube placement, MI, CVA, DVT, PE and the inherent risks with general anesthesia.  The patient voices understanding and wishes to proceed.  -Blood and  urine cultures pending.  She received 1g of rocephin in the ED.    Ellison Hughs, MD [facility] 09/01/2018, 10:30 PM

## 2018-09-01 NOTE — Op Note (Signed)
Operative Note  Preoperative diagnosis:  1.  Obstructing 4 mm left distal ureteral calculus 2.  Urosepsis  Postoperative diagnosis: Same  Procedure(s): 1.  Cystoscopy with left JJ stent placement 2.  Left retrograde pyelogram with intraoperative interpretation of fluoroscopic imaging  Surgeon: Ellison Hughs, MD  Assistants:  None  Anesthesia:  General  Complications:  None  EBL: Less than 5 mL  Specimens: 1.  Urine for culture and sensitivity  Drains/Catheters: 1.  Left 6 French by 24 cm JJ stent 2.  18 French Foley catheter with 10 mL in the balloon  Intraoperative findings:   1. Left retrograde pyelogram demonstrated a filling defects within the distal aspects of the left ureter consistent with the stone seen on her CT with proximal dilation of the left ureter and left renal pelvis.  No other filling defects were identified.  Indication:  ILSA BONELLO is a 53 y.o. female with a 4 mm obstructing left distal ureteral calculus with fevers of 102 F, leukocytosis and lactic acidosis.  She has been consented for the above procedures, voices understanding wishes to proceed.  Description of procedure:  After informed consent was obtained, the patient was brought to the operating room and general LMA anesthesia was administered. The patient was then placed in the dorsolithotomy position and prepped and draped in the usual sterile fashion. A timeout was performed. A 23 French rigid cystoscope was then inserted into the urethral meatus and advanced into the bladder under direct vision. A complete bladder survey revealed no intravesical pathology.  A 5 French ureteral catheter was then inserted into the left ureteral orifice and a left retrograde pyelogram was obtained, with the findings listed above.  A Glidewire was then used to intubate the left ureteral orifice and was advanced up to the left renal pelvis, under fluoroscopic guidance.  A 6 French by 24 cm JJ stent was then  placed over the wire and into good position within the left collecting system, confirming placement via fluoroscopy.  An 39 French Foley catheter was then inserted into the bladder and placed to gravity drainage.  She tolerated the procedure well and was transferred to the postanesthesia in stable condition.  Plan: Blood and urine cultures are pending.  Remove Foley catheter at 8 AM on 09/02/2018.  She will need 2 weeks of culture specific antibiotics.  Follow-up in 1 to 2 weeks to plan follow-up ureteroscopy to remove her distal ureteral stone.

## 2018-09-01 NOTE — ED Notes (Signed)
Date and time results received: 09/01/18 2235(use smartphrase ".now" to insert current time)  Test: lactic acid Critical Value: 2.4  Name of Provider Notified: Dr Regenia Skeeter  Orders Received? Or Actions Taken?: no new orders received.

## 2018-09-01 NOTE — ED Triage Notes (Signed)
Patient reports left flank pain beginning today.  No complaints of burning with urination or seeing blood.  Patient has had history of kidney stones.

## 2018-09-01 NOTE — H&P (Signed)
History and Physical    Rebecca Moreno DGU:[id number] DOB: 10-02-1965 DOA: 09/01/2018  PCP: [name], DO   Patient coming from: Home   Chief Complaint: Left flank pain, chills, N/V   HPI: Rebecca Moreno is a 53 y.o. female with medical history significant for  bipolar disorder, asthma, nephrolithiasis, hypothyroidism, and gastric ulcer, now presenting to the emergency department for evaluation of severe left flank pain with chills and nausea and vomiting.  Patient reports that she had a UTI couple weeks ago that was treated with Keflex and completely resolved.  She had been in her usual state of health throughout the morning today, but developed acute onset of severe left flank pain in the afternoon accompanied by chills and nausea with nonbloody vomiting.  This persisted throughout the evening and she called her PCP for advice.  She was directed to the ED.  Pain is severe, radiating around to the stomach, constant, and without any alleviating or exacerbating factors identified.  Patient denies any cough, shortness of breath, chest pain, headache, or neck stiffness.  ED Course: Upon arrival to the ED, patient is found to be febrile to 39.1 C, tachycardic in the 120s, and with stable blood pressure.  Chemistry panel is notable for creatinine 1.08, similar to priors.  CBC features a leukocytosis to 22,400 and a macrocytosis without anemia.  Urinalysis is suggestive of infection.  CT of the abdomen and pelvis is concerning for mild left hydroureteronephrosis due to 4 mm mid left ureteral calculus.  Urology was consulted by the ED physician and recommended a medical admission to [facility] for intervention.  Urine was sent for culture, 2 L normal saline bolus was given, and the patient was treated with empiric Rocephin.  Review of Systems:  All other systems reviewed and apart from HPI, are negative.  Past Medical History:  Diagnosis Date  . Asthma   . Bipolar disorder ([place])    . Colon polyp   . Depression    Bipolar  . Gastric ulcer    around 2013.  stress related  . GERD (gastroesophageal reflux disease)   . History of kidney stones   . Hyperlipidemia   . Hypothyroid   . Sleep apnea     Past Surgical History:  Procedure Laterality Date  . ABLATION ON ENDOMETRIOSIS    . COLONOSCOPY     Per patient, done around 2013 in [place], had polyp and overdue for follow up.  . COLONOSCOPY WITH PROPOFOL N/A 05/12/2018   Procedure: COLONOSCOPY WITH PROPOFOL;  Surgeon: Daneil Dolin, MD;  Location: [facility];  Service: Endoscopy;  Laterality: N/A;  12:00pm  . OVARIAN CYST SURGERY Left   . POLYPECTOMY  05/12/2018   Procedure: POLYPECTOMY;  Surgeon: Daneil Dolin, MD;  Location: [facility];  Service: Endoscopy;;  polyp at splenic flexure  . TEMPOROMANDIBULAR JOINT SURGERY     9 surgeries  . TUBAL LIGATION       reports that she quit smoking about 6 years ago. Her smoking use included cigarettes. She has a 15.00 pack-year smoking history. She has never used smokeless tobacco. She reports previous drug use. She reports that she does not drink alcohol.  Allergies  Allergen Reactions  . Lithium Other (See Comments)    Psoriasis     Family History  Problem Relation Age of Onset  . Anxiety disorder Mother   . Hypertension Mother   . Heart failure Mother   . Stroke Mother   .  Hyperlipidemia Father   . Diabetes Father   . Stroke Father   . Hypertension Father   . Colon polyps Father        older than 28  . Diabetes Sister   . Hypertension Sister   . Uterine cancer Sister   . Ovarian cancer Maternal Grandmother   . Colon cancer Maternal Grandmother   . Renal Disease Paternal Grandmother   . Bipolar disorder Other      Prior to Admission medications   Medication Sig Start Date End Date Taking? Authorizing Provider  albuterol (PROVENTIL HFA;VENTOLIN HFA) 108 (90 Base) MCG/ACT inhaler Inhale 1 puff into the lungs every 6 (six) hours as needed for  wheezing or shortness of breath.    [provider]  CALCIUM CITRATE-VITAMIN D3 PO Take 1 tablet by mouth daily.    [provider]  Cariprazine HCl (VRAYLAR) 6 MG CAPS Take 1 capsule (6 mg total) by mouth daily. 07/08/18   Norman Clay, MD  Cholecalciferol (VITAMIN D3) 5000 units TABS Take 5,000 Units by mouth daily.     [provider]  clobetasol cream (TEMOVATE) 8.65 % Apply 1 application topically daily as needed (irritation).     [provider]  clonazePAM (KLONOPIN) 1 MG tablet Take 1 mg by mouth 2 (two) times daily.  12/31/17   [provider]  Dexlansoprazole (DEXILANT) 30 MG capsule Take 30 mg by mouth daily.    [provider]  famotidine (PEPCID) 20 MG tablet Take 20 mg by mouth daily.    [provider]  fluticasone (FLONASE) 50 MCG/ACT nasal spray Place 2 sprays into both nostrils daily. 08/06/18   Ronnie Doss M, DO  fluticasone (FLOVENT HFA) 220 MCG/ACT inhaler Inhale 1 puff into the lungs daily.     [provider]  lamoTRIgine (LAMICTAL) 100 MG tablet Take 3 tablets (300 mg total) by mouth at bedtime. 07/08/18   Norman Clay, MD  levothyroxine (SYNTHROID, LEVOTHROID) 50 MCG tablet Take 50 mcg by mouth daily before breakfast.    [provider]  loratadine (CLARITIN) 10 MG tablet Take 1 tablet (10 mg total) by mouth daily. 08/06/18   [name], DO  magnesium gluconate (MAGONATE) 500 MG tablet Take 500 mg by mouth daily.    [provider]  Multiple Vitamins-Minerals (MULTIVITAMIN WOMEN PO) Take 1 tablet by mouth daily.    [provider]  Na Sulfate-K Sulfate-Mg Sulf 17.5-3.13-1.6 GM/177ML SOLN Take 1 kit by mouth as directed. 04/07/18   Rourk, Cristopher Estimable, MD  naproxen (NAPROSYN) 250 MG tablet Take 250 mg by mouth 2 (two) times daily with a meal.    [provider]  QUEtiapine (SEROQUEL) 100 MG tablet Take 100-300 mg by mouth at bedtime.    [provider]   QUEtiapine (SEROQUEL) 300 MG tablet Take 1 tablet (300 mg total) by mouth at bedtime. 07/08/18   Norman Clay, MD  QUEtiapine (SEROQUEL) 50 MG tablet Take 25 mg by mouth 2 (two) times daily.    [provider]  sucralfate (CARAFATE) 1 g tablet Take 1 g by mouth daily as needed.     [provider]  venlafaxine XR (EFFEXOR-XR) 150 MG 24 hr capsule Take 2 capsules (300 mg total) by mouth daily. 07/08/18   Norman Clay, MD    Physical Exam: Vitals:   09/01/18 1932 09/01/18 1945 09/01/18 2000 09/01/18 2023  BP:   114/69   Pulse:  (!) 106 (!) 108 (!) 109  Resp:  Temp: (!) 102.3 F (39.1 C)     TempSrc: Rectal     SpO2:  93% 92% 94%  Weight:      Height:        Constitutional: NAD, calm  Eyes: PERTLA, lids and conjunctivae normal ENMT: Mucous membranes are moist. Posterior pharynx clear of any exudate or lesions.   Neck: normal, supple, no masses, no thyromegaly Respiratory: clear to auscultation bilaterally, no wheezing, no crackles. No accessory muscle use.  Cardiovascular: Rate ~110 and regular. No extremity edema.  Abdomen: No distension, soft, tender in left flank. Bowel sounds active.  Musculoskeletal: no clubbing / cyanosis. No joint deformity upper and lower extremities.    Skin: no significant rashes, lesions, ulcers. Warm, dry, well-perfused. Neurologic: CN 2-12 grossly intact. Sensation intact. Strength 5/5 in all 4 limbs.  Psychiatric: Alert and oriented x 3. Pleasant and cooperative.    Labs on Admission: I have personally reviewed following labs and imaging studies  CBC: Recent Labs  Lab 09/01/18 1752  WBC 22.4*  NEUTROABS 20.9*  HGB 16.9*  HCT 51.3*  MCV 107.1*  PLT 244   Basic Metabolic Panel: Recent Labs  Lab 09/01/18 1752  NA 135  K 3.9  CL 98  CO2 26  GLUCOSE 136*  BUN 12  CREATININE 1.08*  CALCIUM 8.6*   GFR: Estimated Creatinine Clearance: 71.8 mL/min (A) (by C-G formula based on SCr of 1.08 mg/dL (H)). Liver Function  Tests: Recent Labs  Lab 09/01/18 1752  AST 31  ALT 40  ALKPHOS 110  BILITOT 0.9  PROT 6.5  ALBUMIN 3.9   Recent Labs  Lab 09/01/18 1752  LIPASE 26   No results for input(s): AMMONIA in the last 168 hours. Coagulation Profile: No results for input(s): INR, PROTIME in the last 168 hours. Cardiac Enzymes: No results for input(s): CKTOTAL, CKMB, CKMBINDEX, TROPONINI in the last 168 hours. BNP (last 3 results) No results for input(s): PROBNP in the last 8760 hours. HbA1C: No results for input(s): HGBA1C in the last 72 hours. CBG: No results for input(s): GLUCAP in the last 168 hours. Lipid Profile: No results for input(s): CHOL, HDL, LDLCALC, TRIG, CHOLHDL, LDLDIRECT in the last 72 hours. Thyroid Function Tests: No results for input(s): TSH, T4TOTAL, FREET4, T3FREE, THYROIDAB in the last 72 hours. Anemia Panel: No results for input(s): VITAMINB12, FOLATE, FERRITIN, TIBC, IRON, RETICCTPCT in the last 72 hours. Urine analysis:    Component Value Date/Time   COLORURINE YELLOW 09/01/2018 1724   APPEARANCEUR CLOUDY (A) 09/01/2018 1724   APPEARANCEUR Clear 07/22/2018 1535   LABSPEC 1.011 09/01/2018 1724   PHURINE 6.0 09/01/2018 1724   GLUCOSEU NEGATIVE 09/01/2018 1724   HGBUR MODERATE (A) 09/01/2018 1724   BILIRUBINUR NEGATIVE 09/01/2018 1724   BILIRUBINUR Negative 07/22/2018 1535   KETONESUR NEGATIVE 09/01/2018 1724   PROTEINUR NEGATIVE 09/01/2018 1724   NITRITE POSITIVE (A) 09/01/2018 1724   LEUKOCYTESUR LARGE (A) 09/01/2018 1724   Sepsis Labs: '@LABRCNTIP'$ (procalcitonin:4,lacticidven:4) )No results found for this or any previous visit (from the past 240 hour(s)).   Radiological Exams on Admission: Ct Renal Stone Study  Result Date: 09/01/2018 CLINICAL DATA:  Left flank pain and vomiting beginning today. Nephrolithiasis. EXAM: CT ABDOMEN AND PELVIS WITHOUT CONTRAST TECHNIQUE: Multidetector CT imaging of the abdomen and pelvis was performed following the standard protocol  without IV contrast. COMPARISON:  None. FINDINGS: Lower chest: No acute findings. Hepatobiliary: No mass visualized on this unenhanced exam. Severe hepatic steatosis is seen with focal areas of fatty sparing adjacent  to the gallbladder fossa. Gallbladder is unremarkable. No evidence of biliary ductal dilatation. Pancreas: No mass or inflammatory process visualized on this unenhanced exam. Spleen:  Within normal limits in size. Adrenals/Urinary tract: Mild left hydroureteronephrosis is seen due to a 4 mm calculus in the mid left ureter. A few tiny 2-3 mm left renal calculi are also noted. Unremarkable unopacified urinary bladder. Stomach/Bowel: No evidence of obstruction, inflammatory process, or abnormal fluid collections. Normal appendix visualized. Vascular/Lymphatic: No pathologically enlarged lymph nodes identified. No evidence of abdominal aortic aneurysm. Aortic atherosclerosis. Reproductive:  No mass or other significant abnormality. Other:  None. Musculoskeletal:  No suspicious bone lesions identified. IMPRESSION: 1. Mild left hydroureteronephrosis due to 4 mm mid left ureteral calculus. 2. Tiny left renal calculi. 3. Severe hepatic steatosis. Aortic Atherosclerosis (ICD10-I70.0). Electronically Signed   By: Earle Gell M.D.   On: 09/01/2018 18:21    EKG: Not performed.   Assessment/Plan   1. Urosepsis with obstructing left ureteral stone  - Presents with several hours of left flank pain with chills and N/V  - Found to be febrile with HR in 120's, stable BP, leukocytosis to 22,400, and urine concerning for infection   - CT abd/pelvis with mild left hydroureteronephrosis secondary to 56m stone in mid left ureter   - Urology is consulting and much appreciated, [facility]  - Urine was sent for culture from ED, IVF bolus was given, and Rocephin ordered   - Culture blood, check lactate, continue empiric antibiotics and supportive care    2.  Bipolar disorder  - Stable  - Continue home medications pending pharmacy med-rec    3. Hypothyroidism  - Continue Synthroid pending pharmacy med-rec   4. Asthma  - No cough or wheeze, continue ICS and prn albuterol     PPE: Mask, face shield  DVT prophylaxis: SCD's  Code Status: Full  Family Communication: Discussed with patient  Consults called: Urology  Admission status: Observation     TVianne Bulls MD [facility] Pager [phone] If 7PM-7AM, please contact night-coverage [email or web address] Password [email or web address] 09/01/2018, 8:36 PM

## 2018-09-01 NOTE — Patient Instructions (Signed)
Pt referred to ED for 10/10 flank pain with chills, nausea, and vomiting.

## 2018-09-01 NOTE — ED Provider Notes (Signed)
[facility] EMERGENCY DEPARTMENT Provider Note   CSN: [id number] Arrival date & time: 09/01/18  1650    History   Chief Complaint Chief Complaint  Patient presents with  . Flank Pain    left    HPI Rebecca Moreno is a 53 y.o. female.     HPI  53 year old female presents with left flank pain and abdominal pain.  Feels like prior kidney stones, which she has had 3 times before.  She is been able to pass all 3.  This 1 started earlier today and has been severe.  The pain is about 8 out of 10 and stabbing.  She has not tried anything for the pain.  She vomited once and has had intermittent nausea.  No fevers or urinary symptoms such as dysuria, frequency, or hematuria.  No cough or shortness of breath.  Past Medical History:  Diagnosis Date  . Asthma   . Bipolar disorder ([place])   . Colon polyp   . Depression    Bipolar  . Gastric ulcer    around 2013.  stress related  . GERD (gastroesophageal reflux disease)   . History of kidney stones   . Hyperlipidemia   . Hypothyroid   . Sleep apnea     Patient Active Problem List   Diagnosis Date Noted  . Elevated blood pressure reading 05/19/2018  . Mood disorder in conditions classified elsewhere 04/14/2018  . History of colonic polyps 04/07/2018  . Elevated transaminase level 04/07/2018  . GERD (gastroesophageal reflux disease) 04/07/2018  . Active bleeding 04/07/2018  . Rectal bleeding 04/07/2018  . Psoriasis 01/21/2018  . Osteoporosis 01/21/2018  . Polyp of colon 01/15/2018  . Post-menopausal bleeding 01/15/2018  . Bipolar disorder, in partial remission, most recent episode mixed ([place]) 01/15/2018  . Acquired hypothyroidism 01/15/2018  . History of gastric ulcer 01/15/2018  . Gastroesophageal reflux disease with esophagitis 01/15/2018    Past Surgical History:  Procedure Laterality Date  . ABLATION ON ENDOMETRIOSIS    . COLONOSCOPY     Per patient, done around 2013 in [place], had polyp and overdue for follow up.   . COLONOSCOPY WITH PROPOFOL N/A 05/12/2018   Procedure: COLONOSCOPY WITH PROPOFOL;  Surgeon: Daneil Dolin, MD;  Location: [facility] ENDO SUITE;  Service: Endoscopy;  Laterality: N/A;  12:00pm  . OVARIAN CYST SURGERY Left   . POLYPECTOMY  05/12/2018   Procedure: POLYPECTOMY;  Surgeon: Daneil Dolin, MD;  Location: [facility] END[facility] SU[facility];  Service: Endoscopy;;  polyp at splenic flexure  . TEMPOROMANDIBULAR JOINT SURGERY     9 surgeries  . TUBAL LIGATION       OB History    Gravida  1   Para  1   Term  1   Preterm      AB      Living  1     SAB      TAB      Ectopic      Multiple      Live Births  1            Home Medications    Prior to Admission medications   Medication Sig Start Date End Date Taking? Authorizing Provider  albuterol (PROVENTIL HFA;VENTOLIN HFA) 108 (90 Base) MCG/ACT inhaler Inhale 1 puff into the lungs every 6 (six) hours as needed for wheezing or shortness of breath.    [provider]  CALCIUM CITRATE-VITAMIN D3 PO Take 1 tablet by mouth daily.    [provider]  Cariprazine HCl (VRAYLAR) 6 MG CAPS Take 1 capsule (6 mg total) by mouth daily. 07/08/18   Norman Clay, MD  Cholecalciferol (VITAMIN D3) 5000 units TABS Take 5,000 Units by mouth daily.     [provider]  clobetasol cream (TEMOVATE) 6.21 % Apply 1 application topically daily as needed (irritation).     [provider]  clonazePAM (KLONOPIN) 1 MG tablet Take 1 mg by mouth 2 (two) times daily.  12/31/17   [provider]  Dexlansoprazole (DEXILANT) 30 MG capsule Take 30 mg by mouth daily.    [provider]  famotidine (PEPCID) 20 MG tablet Take 20 mg by mouth daily.    [provider]  fluticasone (FLONASE) 50 MCG/ACT nasal spray Place 2 sprays into both nostrils daily. 08/06/18   Ronnie Doss M, DO  fluticasone (FLOVENT HFA) 220 MCG/ACT inhaler Inhale 1 puff into the lungs daily.     [provider]  lamoTRIgine (LAMICTAL)  100 MG tablet Take 3 tablets (300 mg total) by mouth at bedtime. 07/08/18   Norman Clay, MD  levothyroxine (SYNTHROID, LEVOTHROID) 50 MCG tablet Take 50 mcg by mouth daily before breakfast.    [provider]  loratadine (CLARITIN) 10 MG tablet Take 1 tablet (10 mg total) by mouth daily. 08/06/18   [name], DO  magnesium gluconate (MAGONATE) 500 MG tablet Take 500 mg by mouth daily.    [provider]  Multiple Vitamins-Minerals (MULTIVITAMIN WOMEN PO) Take 1 tablet by mouth daily.    [provider]  Na Sulfate-K Sulfate-Mg Sulf 17.5-3.13-1.6 GM/177ML SOLN Take 1 kit by mouth as directed. 04/07/18   Rourk, Cristopher Estimable, MD  naproxen (NAPROSYN) 250 MG tablet Take 250 mg by mouth 2 (two) times daily with a meal.    [provider]  QUEtiapine (SEROQUEL) 100 MG tablet Take 100-300 mg by mouth at bedtime.    [provider]  QUEtiapine (SEROQUEL) 300 MG tablet Take 1 tablet (300 mg total) by mouth at bedtime. 07/08/18   Norman Clay, MD  QUEtiapine (SEROQUEL) 50 MG tablet Take 25 mg by mouth 2 (two) times daily.    [provider]  sucralfate (CARAFATE) 1 g tablet Take 1 g by mouth daily as needed.     [provider]  venlafaxine XR (EFFEXOR-XR) 150 MG 24 hr capsule Take 2 capsules (300 mg total) by mouth daily. 07/08/18   Norman Clay, MD    Family History Family History  Problem Relation Age of Onset  . Anxiety disorder Mother   . Hypertension Mother   . Heart failure Mother   . Stroke Mother   . Hyperlipidemia Father   . Diabetes Father   . Stroke Father   . Hypertension Father   . Colon polyps Father        older than 78  . Diabetes Sister   . Hypertension Sister   . Uterine cancer Sister   . Ovarian cancer Maternal Grandmother   . Colon cancer Maternal Grandmother   . Renal Disease Paternal Grandmother   . Bipolar disorder Other     Social History Social History   Tobacco Use  . Smoking status: Former Smoker     Packs/day: 0.50    Years: 30.00    Pack years: 15.00    Types: Cigarettes    Last attempt to quit: 05/07/2012    Years since quitting: 6.3  . Smokeless tobacco: Never Used  Substance Use Topics  . Alcohol  use: Never    Frequency: Never  . Drug use: Not Currently     Allergies   Lithium   Review of Systems Review of Systems  Constitutional: Negative for fever.  Respiratory: Negative for cough and shortness of breath.   Gastrointestinal: Positive for abdominal pain, nausea and vomiting.  Genitourinary: Positive for flank pain. Negative for decreased urine volume, dysuria, frequency, hematuria and urgency.  Musculoskeletal: Positive for back pain.  All other systems reviewed and are negative.    Physical Exam Updated Vital Signs BP (!) 159/82 (BP Location: Right Arm)   Pulse (!) 122   Temp 99.9 F (37.7 C) (Oral)   Resp 17   Ht '5\' 6"'$  (1.676 m)   Wt 99.8 kg   LMP 12/13/2017 (Approximate)   SpO2 99%   BMI 35.51 kg/m   Physical Exam Vitals signs and nursing note reviewed.  Constitutional:      Appearance: She is well-developed. She is not ill-appearing or diaphoretic.  HENT:     Head: Normocephalic and atraumatic.     Right Ear: External ear normal.     Left Ear: External ear normal.     Nose: Nose normal.  Eyes:     General:        Right eye: No discharge.        Left eye: No discharge.  Cardiovascular:     Rate and Rhythm: Regular rhythm. Tachycardia present.     Heart sounds: Normal heart sounds.  Pulmonary:     Effort: Pulmonary effort is normal.     Breath sounds: Normal breath sounds.  Abdominal:     Palpations: Abdomen is soft.     Tenderness: There is abdominal tenderness (mild) in the left upper quadrant. There is left CVA tenderness (mild). There is no right CVA tenderness.  Skin:    General: Skin is warm and dry.  Neurological:     Mental Status: She is alert.  Psychiatric:        Mood and Affect: Mood is not anxious.      ED  Treatments / Results  Labs (all labs ordered are listed, but only abnormal results are displayed) Labs Reviewed  URINALYSIS, ROUTINE W REFLEX MICROSCOPIC - Abnormal; Notable for the following components:      Result Value   APPearance CLOUDY (*)    Hgb urine dipstick MODERATE (*)    Nitrite POSITIVE (*)    Leukocytes,Ua LARGE (*)    WBC, UA >50 (*)    Bacteria, UA RARE (*)    All other components within normal limits  COMPREHENSIVE METABOLIC PANEL - Abnormal; Notable for the following components:   Glucose, Bld 136 (*)    Creatinine, Ser 1.08 (*)    Calcium 8.6 (*)    GFR calc non Af Amer 59 (*)    All other components within normal limits  CBC WITH DIFFERENTIAL/PLATELET - Abnormal; Notable for the following components:   WBC 22.4 (*)    Hemoglobin 16.9 (*)    HCT 51.3 (*)    MCV 107.1 (*)    MCH 35.3 (*)    Neutro Abs 20.9 (*)    Lymphs Abs 0.5 (*)    Abs Immature Granulocytes 0.15 (*)    All other components within normal limits  LACTIC ACID, PLASMA - Abnormal; Notable for the following components:   Lactic Acid, Venous 2.4 (*)    All other components within normal limits  CULTURE, BLOOD (ROUTINE X 2)  URINE CULTURE  CULTURE,  BLOOD (ROUTINE X 2)  LIPASE, BLOOD  HIV ANTIBODY (ROUTINE TESTING W REFLEX)  BASIC METABOLIC PANEL  CBC WITH DIFFERENTIAL/PLATELET  LACTIC ACID, PLASMA    EKG None  Radiology Dg C-arm 1-60 Min-no Report  Result Date: 09/01/2018 Fluoroscopy was utilized by the requesting physician.  No radiographic interpretation.   Ct Renal Stone Study  Result Date: 09/01/2018 CLINICAL DATA:  Left flank pain and vomiting beginning today. Nephrolithiasis. EXAM: CT ABDOMEN AND PELVIS WITHOUT CONTRAST TECHNIQUE: Multidetector CT imaging of the abdomen and pelvis was performed following the standard protocol without IV contrast. COMPARISON:  None. FINDINGS: Lower chest: No acute findings. Hepatobiliary: No mass visualized on this unenhanced exam. Severe hepatic  steatosis is seen with focal areas of fatty sparing adjacent to the gallbladder fossa. Gallbladder is unremarkable. No evidence of biliary ductal dilatation. Pancreas: No mass or inflammatory process visualized on this unenhanced exam. Spleen:  Within normal limits in size. Adrenals/Urinary tract: Mild left hydroureteronephrosis is seen due to a 4 mm calculus in the mid left ureter. A few tiny 2-3 mm left renal calculi are also noted. Unremarkable unopacified urinary bladder. Stomach/Bowel: No evidence of obstruction, inflammatory process, or abnormal fluid collections. Normal appendix visualized. Vascular/Lymphatic: No pathologically enlarged lymph nodes identified. No evidence of abdominal aortic aneurysm. Aortic atherosclerosis. Reproductive:  No mass or other significant abnormality. Other:  None. Musculoskeletal:  No suspicious bone lesions identified. IMPRESSION: 1. Mild left hydroureteronephrosis due to 4 mm mid left ureteral calculus. 2. Tiny left renal calculi. 3. Severe hepatic steatosis. Aortic Atherosclerosis (ICD10-I70.0). Electronically Signed   By: Earle Gell M.D.   On: 09/01/2018 18:21    Procedures .Critical Care Performed by: Sherwood Gambler, MD Authorized by: Sherwood Gambler, MD   Critical care provider statement:    Critical care time (minutes):  30   Critical care time was exclusive of:  Separately billable procedures and treating other patients   Critical care was necessary to treat or prevent imminent or life-threatening deterioration of the following conditions:  Shock and sepsis   Critical care was time spent personally by me on the following activities:  Development of treatment plan with patient or surrogate, discussions with consultants, evaluation of patient's response to treatment, examination of patient, obtaining history from patient or surrogate, ordering and performing treatments and interventions, ordering and review of laboratory studies, ordering and review of  radiographic studies, pulse oximetry, re-evaluation of patient's condition and review of old charts   (including critical care time)  Medications Ordered in ED Medications  albuterol (PROVENTIL) (2.5 MG/3ML) 0.083% nebulizer solution 3 mL ( Inhalation MAR Hold 09/01/18 2307)  sodium chloride flush (NS) 0.9 % injection 3 mL ( Intravenous Automatically Held 09/09/18 2200)  0.9 %  sodium chloride infusion (has no administration in time range)  acetaminophen (TYLENOL) tablet 650 mg ( Oral MAR Hold 09/01/18 2307)    Or  acetaminophen (TYLENOL) suppository 650 mg ( Rectal MAR Hold 09/01/18 2307)  HYDROcodone-acetaminophen (NORCO/VICODIN) 5-325 MG per tablet 1-2 tablet ( Oral MAR Hold 09/01/18 2307)  polyethylene glycol (MIRALAX / GLYCOLAX) packet 17 g ( Oral MAR Hold 09/01/18 2307)  ondansetron (ZOFRAN) tablet 4 mg ( Oral MAR Hold 09/01/18 2307)    Or  ondansetron (ZOFRAN) injection 4 mg ( Intravenous MAR Hold 09/01/18 2307)  cefTRIAXone (ROCEPHIN) 2 g in sodium chloride 0.9 % 100 mL IVPB ( Intravenous Automatically Held 09/10/18 2000)  HYDROmorphone (DILAUDID) injection 1 mg ( Intravenous MAR Hold 09/01/18 2307)  budesonide (PULMICORT) nebulizer solution 0.25 mg (  Nebulization Automatically Held 09/09/18 2000)  oxyCODONE (Oxy IR/ROXICODONE) immediate release tablet 5 mg (has no administration in time range)    Or  oxyCODONE (ROXICODONE) 5 MG/5ML solution 5 mg (has no administration in time range)  fentaNYL (SUBLIMAZE) injection 25-50 mcg (has no administration in time range)  ondansetron (ZOFRAN) injection 4 mg (has no administration in time range)  lactated ringers bolus 1,000 mL (0 mLs Intravenous Stopped 09/01/18 1932)  HYDROmorphone (DILAUDID) injection 1 mg (1 mg Intravenous Given 09/01/18 1750)  ondansetron (ZOFRAN) injection 4 mg (4 mg Intravenous Given 09/01/18 1750)  lactated ringers bolus 1,000 mL (0 mLs Intravenous Stopped 09/01/18 2204)  acetaminophen (TYLENOL) tablet 1,000 mg (1,000 mg Oral Given  09/01/18 1936)  cefTRIAXone (ROCEPHIN) 1 g in sodium chloride 0.9 % 100 mL IVPB (0 g Intravenous Stopped 09/01/18 2038)     Initial Impression / Assessment and Plan / ED Course  I have reviewed the triage vital signs and the nursing notes.  Pertinent labs & imaging results that were available during my care of the patient were reviewed by me and considered in my medical decision making (see chart for details).        CT scan confirms left ureteral stone.  She has a significantly elevated WBC though initially no fever.  Rectal temp was obtained and her temperature was 102.  Concern for infected stone.  Urine is appearing like a UTI.  Discussed with Dr. Lovena Neighbours, would like her admitted to the hospitalist service and transferred to [facility] for stent.  Dr. Myna Hidalgo to admit. Given Rocephin and fluids.  Final Clinical Impressions(s) / ED Diagnoses   Final diagnoses:  Left ureteral stone  Acute pyelonephritis    ED Discharge Orders    None       Sherwood Gambler, MD 09/01/18 2358

## 2018-09-01 NOTE — Anesthesia Procedure Notes (Signed)
Procedure Name: Intubation Date/Time: 09/01/2018 11:15 PM Performed by: Niel Hummer, CRNA Pre-anesthesia Checklist: Patient being monitored, Suction available, Emergency Drugs available and Patient identified Patient Re-evaluated:Patient Re-evaluated prior to induction Oxygen Delivery Method: Circle system utilized Preoxygenation: Pre-oxygenation with 100% oxygen Induction Type: IV induction and Rapid sequence Laryngoscope Size: Mac and 4 Grade View: Grade II Tube type: Oral Tube size: 7.0 mm Number of attempts: 1 Airway Equipment and Method: Stylet Placement Confirmation: ETT inserted through vocal cords under direct vision,  positive ETCO2 and breath sounds checked- equal and bilateral Secured at: 22 cm Tube secured with: Tape Dental Injury: Teeth and Oropharynx as per pre-operative assessment

## 2018-09-02 DIAGNOSIS — Z8041 Family history of malignant neoplasm of ovary: Secondary | ICD-10-CM | POA: Diagnosis not present

## 2018-09-02 DIAGNOSIS — N202 Calculus of kidney with calculus of ureter: Secondary | ICD-10-CM | POA: Diagnosis present

## 2018-09-02 DIAGNOSIS — E039 Hypothyroidism, unspecified: Secondary | ICD-10-CM | POA: Diagnosis present

## 2018-09-02 DIAGNOSIS — K219 Gastro-esophageal reflux disease without esophagitis: Secondary | ICD-10-CM | POA: Diagnosis present

## 2018-09-02 DIAGNOSIS — J45909 Unspecified asthma, uncomplicated: Secondary | ICD-10-CM | POA: Diagnosis present

## 2018-09-02 DIAGNOSIS — F3177 Bipolar disorder, in partial remission, most recent episode mixed: Secondary | ICD-10-CM | POA: Diagnosis present

## 2018-09-02 DIAGNOSIS — Z8049 Family history of malignant neoplasm of other genital organs: Secondary | ICD-10-CM | POA: Diagnosis not present

## 2018-09-02 DIAGNOSIS — J9811 Atelectasis: Secondary | ICD-10-CM | POA: Diagnosis not present

## 2018-09-02 DIAGNOSIS — Z8349 Family history of other endocrine, nutritional and metabolic diseases: Secondary | ICD-10-CM | POA: Diagnosis not present

## 2018-09-02 DIAGNOSIS — D7589 Other specified diseases of blood and blood-forming organs: Secondary | ICD-10-CM | POA: Diagnosis present

## 2018-09-02 DIAGNOSIS — Z8249 Family history of ischemic heart disease and other diseases of the circulatory system: Secondary | ICD-10-CM | POA: Diagnosis not present

## 2018-09-02 DIAGNOSIS — A4151 Sepsis due to Escherichia coli [E. coli]: Secondary | ICD-10-CM | POA: Diagnosis not present

## 2018-09-02 DIAGNOSIS — Z8711 Personal history of peptic ulcer disease: Secondary | ICD-10-CM | POA: Diagnosis not present

## 2018-09-02 DIAGNOSIS — N10 Acute tubulo-interstitial nephritis: Secondary | ICD-10-CM | POA: Diagnosis not present

## 2018-09-02 DIAGNOSIS — Z87891 Personal history of nicotine dependence: Secondary | ICD-10-CM | POA: Diagnosis not present

## 2018-09-02 DIAGNOSIS — R0902 Hypoxemia: Secondary | ICD-10-CM | POA: Diagnosis present

## 2018-09-02 DIAGNOSIS — N136 Pyonephrosis: Secondary | ICD-10-CM | POA: Diagnosis present

## 2018-09-02 DIAGNOSIS — Z823 Family history of stroke: Secondary | ICD-10-CM | POA: Diagnosis not present

## 2018-09-02 DIAGNOSIS — Z833 Family history of diabetes mellitus: Secondary | ICD-10-CM | POA: Diagnosis not present

## 2018-09-02 DIAGNOSIS — N111 Chronic obstructive pyelonephritis: Secondary | ICD-10-CM | POA: Diagnosis not present

## 2018-09-02 DIAGNOSIS — N132 Hydronephrosis with renal and ureteral calculous obstruction: Secondary | ICD-10-CM | POA: Diagnosis not present

## 2018-09-02 DIAGNOSIS — Z800 Family history of malignant neoplasm of digestive organs: Secondary | ICD-10-CM | POA: Diagnosis not present

## 2018-09-02 DIAGNOSIS — G4730 Sleep apnea, unspecified: Secondary | ICD-10-CM | POA: Diagnosis present

## 2018-09-02 LAB — CBC WITH DIFFERENTIAL/PLATELET
Abs Immature Granulocytes: 0.5 10*3/uL — ABNORMAL HIGH (ref 0.00–0.07)
Basophils Absolute: 0.1 10*3/uL (ref 0.0–0.1)
Basophils Relative: 0 %
Eosinophils Absolute: 0.1 10*3/uL (ref 0.0–0.5)
Eosinophils Relative: 0 %
HCT: 48.7 % — ABNORMAL HIGH (ref 36.0–46.0)
Hemoglobin: 15.5 g/dL — ABNORMAL HIGH (ref 12.0–15.0)
Immature Granulocytes: 1 %
Lymphocytes Relative: 3 %
Lymphs Abs: 0.9 10*3/uL (ref 0.7–4.0)
MCH: 35.1 pg — ABNORMAL HIGH (ref 26.0–34.0)
MCHC: 31.8 g/dL (ref 30.0–36.0)
MCV: 110.2 fL — ABNORMAL HIGH (ref 80.0–100.0)
Monocytes Absolute: 2.1 10*3/uL — ABNORMAL HIGH (ref 0.1–1.0)
Monocytes Relative: 6 %
Neutro Abs: 31 10*3/uL — ABNORMAL HIGH (ref 1.7–7.7)
Neutrophils Relative %: 90 %
Platelets: 175 10*3/uL (ref 150–400)
RBC: 4.42 MIL/uL (ref 3.87–5.11)
RDW: 14.8 % (ref 11.5–15.5)
WBC Morphology: INCREASED
WBC: 34.7 10*3/uL — ABNORMAL HIGH (ref 4.0–10.5)
nRBC: 0 % (ref 0.0–0.2)

## 2018-09-02 LAB — LACTIC ACID, PLASMA
Lactic Acid, Venous: 2.1 mmol/L (ref 0.5–1.9)
Lactic Acid, Venous: 1.4 mmol/L (ref 0.5–1.9)

## 2018-09-02 LAB — MRSA PCR SCREENING: MRSA by PCR: POSITIVE — AB

## 2018-09-02 MED ORDER — LEVOTHYROXINE SODIUM 50 MCG PO TABS
50.0000 ug | ORAL_TABLET | Freq: Every day | ORAL | Status: DC
  Administered 2018-09-03 – 2018-09-04 (×2): 50 ug via ORAL
  Filled 2018-09-02 (×2): qty 1

## 2018-09-02 MED ORDER — SODIUM CHLORIDE 0.9 % IV SOLN
1.0000 g | INTRAVENOUS | Status: DC
  Administered 2018-09-02 – 2018-09-03 (×2): 1 g via INTRAVENOUS
  Filled 2018-09-02: qty 10
  Filled 2018-09-02 (×2): qty 1

## 2018-09-02 MED ORDER — QUETIAPINE FUMARATE 100 MG PO TABS
300.0000 mg | ORAL_TABLET | Freq: Every day | ORAL | Status: DC
  Administered 2018-09-02 – 2018-09-03 (×2): 300 mg via ORAL
  Filled 2018-09-02 (×2): qty 3

## 2018-09-02 MED ORDER — PANTOPRAZOLE SODIUM 40 MG PO TBEC
40.0000 mg | DELAYED_RELEASE_TABLET | Freq: Every day | ORAL | Status: DC
  Administered 2018-09-02 – 2018-09-04 (×3): 40 mg via ORAL
  Filled 2018-09-02 (×3): qty 1

## 2018-09-02 MED ORDER — ORAL CARE MOUTH RINSE
15.0000 mL | Freq: Two times a day (BID) | OROMUCOSAL | Status: DC
  Administered 2018-09-02 – 2018-09-04 (×5): 15 mL via OROMUCOSAL

## 2018-09-02 MED ORDER — MUPIROCIN 2 % EX OINT
1.0000 "application " | TOPICAL_OINTMENT | Freq: Two times a day (BID) | CUTANEOUS | Status: DC
  Administered 2018-09-02 – 2018-09-04 (×5): 1 via NASAL
  Filled 2018-09-02 (×2): qty 22

## 2018-09-02 MED ORDER — CHLORHEXIDINE GLUCONATE CLOTH 2 % EX PADS
6.0000 | MEDICATED_PAD | Freq: Every day | CUTANEOUS | Status: DC
  Administered 2018-09-03: 6 via TOPICAL

## 2018-09-02 MED ORDER — CARIPRAZINE HCL 1.5 MG PO CAPS
6.0000 mg | ORAL_CAPSULE | Freq: Every day | ORAL | Status: DC
  Administered 2018-09-02 – 2018-09-04 (×3): 6 mg via ORAL
  Filled 2018-09-02 (×3): qty 4

## 2018-09-02 MED ORDER — VENLAFAXINE HCL ER 150 MG PO CP24
300.0000 mg | ORAL_CAPSULE | Freq: Every day | ORAL | Status: DC
  Administered 2018-09-02 – 2018-09-04 (×3): 300 mg via ORAL
  Filled 2018-09-02 (×3): qty 2

## 2018-09-02 MED ORDER — LAMOTRIGINE 100 MG PO TABS
300.0000 mg | ORAL_TABLET | Freq: Every day | ORAL | Status: DC
  Administered 2018-09-02 – 2018-09-03 (×2): 300 mg via ORAL
  Filled 2018-09-02 (×2): qty 3

## 2018-09-02 NOTE — Anesthesia Postprocedure Evaluation (Signed)
Anesthesia Post Note  Patient: CLARABELL MATSUOKA  Procedure(s) Performed: CYSTOSCOPY WITH RETROGRADE PYELOGRAM/URETERAL STENT PLACEMENT (Left Ureter)     Patient location during evaluation: PACU Anesthesia Type: General Level of consciousness: awake and alert Pain management: pain level controlled Vital Signs Assessment: post-procedure vital signs reviewed and stable Respiratory status: spontaneous breathing, nonlabored ventilation, respiratory function stable and patient connected to nasal cannula oxygen Cardiovascular status: blood pressure returned to baseline and stable Postop Assessment: no apparent nausea or vomiting Anesthetic complications: no    Last Vitals:  Vitals:   09/02/18 1154 09/02/18 1325  BP: 134/72 127/84  Pulse: 80 80  Resp: 15 20  Temp:  36.8 C  SpO2: 96% 96%    Last Pain:  Vitals:   09/02/18 1325  TempSrc: Oral  PainSc: 0-No pain                 Effie Berkshire

## 2018-09-02 NOTE — Progress Notes (Signed)
PROGRESS NOTE    Rebecca Moreno  GGY:[id number] DOB: Jun 22, 1965 DOA: 09/01/2018 PCP: [name], DO   Brief Narrative: Patient is a 53 year old female with history of bipolar disorder, asthma, nephrolithiasis, hypothyroidism who presented to the emergency department for the evaluation of severe left flank pain.  She was also experiencing chills, nausea and vomiting at home.  Patient reported that she was treated for urinary tract infection couple of weeks ago with Keflex and was completely resolved.  She was febrile on presentation, tachycardic but blood pressure was within normal limits.  She had leukocytosis and urine analysis was suspicious for urinary tract infection.  CT abdomen/pelvis showed mild left hydroureteronephrosis and 4 mm mid left ureteral calculus.  Urology consulted and she underwent cystoscopy with left-sided JJ stent placement.  Urine culture, blood cultures pending.  Currently on IV antibiotics.  Assessment & Plan:   Principal Problem:   Obstructive pyelonephritis Active Problems:   Bipolar disorder, in partial remission, most recent episode mixed ([place])   Acquired hypothyroidism  Complicated urinary tract infection: Clinical presentation as above.CT abdomen/pelvis showed mild left hydroureteronephrosis and 4 mm mid left ureteral calculus.  Urology consulted and she underwent cystoscopy with left-sided JJ stent placement.  Urine culture, blood cultures pending.  Currently on ceftriaxone.  We will follow-up cultures and continue current antibiotics for now.  Severe leukocytosis: CBC trended up today.  Patient is afebrile and hemodynamically stable.  Continue current management for now.  Follow-up CBC tomorrow.  Bipolar disorder: Continue home medications. Resumed today.  Hypothyroidism: Continue Synthyroid  Asthma: Currently stable.  Lactic acidosis: Resolved with IV fluids.  Macrocytosis: Hemoglobin stable.Monitor as outpatient.         DVT  prophylaxis:SCD Code Status: Full Family Communication: None present at the bedside Disposition Plan: Home likely tomorrow.  Could not discharge today because of upward trend of CBC.  Might need to follow-up at least urine culture before discharge.If CBC trends down tomorrow and she remains hemodynamically stable, may consider discharge with oral antibiotics   Consultants: Urology  Procedures:JJ stent placement   Antimicrobials:  Anti-infectives (From admission, onward)   Start     Dose/Rate Route Frequency Ordered Stop   09/02/18 2000  cefTRIAXone (ROCEPHIN) 2 g in sodium chloride 0.9 % 100 mL IVPB  Status:  Discontinued     2 g 200 mL/hr over 30 Minutes Intravenous Every 24 hours 09/01/18 2036 09/02/18 1052   09/02/18 2000  cefTRIAXone (ROCEPHIN) 1 g in sodium chloride 0.9 % 100 mL IVPB     1 g 200 mL/hr over 30 Minutes Intravenous Every 24 hours 09/02/18 1052     09/01/18 1945  cefTRIAXone (ROCEPHIN) 1 g in sodium chloride 0.9 % 100 mL IVPB     1 g 200 mL/hr over 30 Minutes Intravenous  Once 09/01/18 1933 09/01/18 2038      Subjective: Patient seen and examined at bedside this morning.  Looks comfortable.  Hemodynamically stable.  Denies any pain or discomfort.  Objective: Vitals:   09/02/18 0841 09/02/18 0859 09/02/18 0900 09/02/18 1000  BP:  125/76 129/78 135/80  Pulse:  83 83 87  Resp:  15 12 16   Temp:   (!) 97 F (36.1 C)   TempSrc:   Axillary   SpO2: 100% 90% 96% 93%  Weight:      Height:        Intake/Output Summary (Last 24 hours) at 09/02/2018 1056 Last data filed at 09/02/2018 1026 Gross per 24 hour  Intake 3989.34  ml  Output 2460 ml  Net 1529.34 ml   Filed Weights   09/01/18 1659 09/02/18 0100  Weight: 99.8 kg 103 kg    Examination:  General exam: Appears calm and comfortable ,Not in distress,obese HEENT:PERRL,Oral mucosa moist, Ear/Nose normal on gross exam Respiratory system: Bilateral equal air entry, normal vesicular breath sounds, no wheezes  or crackles  Cardiovascular system: S1 & S2 heard, RRR. No JVD, murmurs, rubs, gallops or clicks. No pedal edema. Gastrointestinal system: Abdomen is nondistended, soft and nontender. No organomegaly or masses felt. Normal bowel sounds heard. Central nervous system: Alert and oriented. No focal neurological deficits. Extremities: No edema, no clubbing ,no cyanosis, distal peripheral pulses palpable. Skin: No rashes, lesions or ulcers,no icterus ,no pallor MSK: Normal muscle bulk,tone ,power Psychiatry: Judgement and insight appear normal. Mood & affect appropriate.   Data Reviewed: I have personally reviewed following labs and imaging studies  CBC: Recent Labs  Lab 09/01/18 1752 09/02/18 0802  WBC 22.4* 34.7*  NEUTROABS 20.9* 31.0*  HGB 16.9* 15.5*  HCT 51.3* 48.7*  MCV 107.1* 110.2*  PLT 207 536   Basic Metabolic Panel: Recent Labs  Lab 09/01/18 1752  NA 135  K 3.9  CL 98  CO2 26  GLUCOSE 136*  BUN 12  CREATININE 1.08*  CALCIUM 8.6*   GFR: Estimated Creatinine Clearance: 73 mL/min (A) (by C-G formula based on SCr of 1.08 mg/dL (H)). Liver Function Tests: Recent Labs  Lab 09/01/18 1752  AST 31  ALT 40  ALKPHOS 110  BILITOT 0.9  PROT 6.5  ALBUMIN 3.9   Recent Labs  Lab 09/01/18 1752  LIPASE 26   No results for input(s): AMMONIA in the last 168 hours. Coagulation Profile: No results for input(s): INR, PROTIME in the last 168 hours. Cardiac Enzymes: No results for input(s): CKTOTAL, CKMB, CKMBINDEX, TROPONINI in the last 168 hours. BNP (last 3 results) No results for input(s): PROBNP in the last 8760 hours. HbA1C: No results for input(s): HGBA1C in the last 72 hours. CBG: No results for input(s): GLUCAP in the last 168 hours. Lipid Profile: No results for input(s): CHOL, HDL, LDLCALC, TRIG, CHOLHDL, LDLDIRECT in the last 72 hours. Thyroid Function Tests: No results for input(s): TSH, T4TOTAL, FREET4, T3FREE, THYROIDAB in the last 72 hours. Anemia  Panel: No results for input(s): VITAMINB12, FOLATE, FERRITIN, TIBC, IRON, RETICCTPCT in the last 72 hours. Sepsis Labs: Recent Labs  Lab 09/01/18 2035 09/02/18 0227 09/02/18 0800  LATICACIDVEN 2.4* 2.1* 1.4    Recent Results (from the past 240 hour(s))  Culture, blood (x 2)     Status: None (Preliminary result)   Collection Time: 09/01/18  9:41 PM  Result Value Ref Range Status   Specimen Description RIGHT ANTECUBITAL  Final   Special Requests   Final    BOTTLES DRAWN AEROBIC AND ANAEROBIC Blood Culture adequate volume   Culture   Final    NO GROWTH < 12 HOURS Performed at [facility], [address], [place], [place] [address]    Report Status PENDING  Incomplete  Culture, blood (x 2)     Status: None (Preliminary result)   Collection Time: 09/01/18  9:45 PM  Result Value Ref Range Status   Specimen Description BLOOD  Final   Special Requests NONE  Final   Culture   Final    NO GROWTH < 12 HOURS Performed at [facility], [address], [place], [place] [address]    Report Status PENDING  Incomplete  MRSA PCR  Screening     Status: Abnormal   Collection Time: 09/02/18  1:20 AM  Result Value Ref Range Status   MRSA by PCR POSITIVE (A) NEGATIVE Final    Comment:        The GeneXpert MRSA Assay (FDA approved for NASAL specimens only), is one component of a comprehensive MRSA colonization surveillance program. It is not intended to diagnose MRSA infection nor to guide or monitor treatment for MRSA infections. CRITICAL RESULT CALLED TO, READ BACK BY AND VERIFIED WITH: RN [facility] S. AT 4944 09/02/18 CRUICKSHANK A Performed at [facility], [place] [address] [place], [place] [address]          Radiology Studies: Dg C-arm 1-60 Min-no Report  Result Date: 09/01/2018 Fluoroscopy was utilized by the requesting physician.  No radiographic interpretation.   Ct Renal Stone Study  Result Date: 09/01/2018 CLINICAL DATA:  Left flank pain and vomiting  beginning today. Nephrolithiasis. EXAM: CT ABDOMEN AND PELVIS WITHOUT CONTRAST TECHNIQUE: Multidetector CT imaging of the abdomen and pelvis was performed following the standard protocol without IV contrast. COMPARISON:  None. FINDINGS: Lower chest: No acute findings. Hepatobiliary: No mass visualized on this unenhanced exam. Severe hepatic steatosis is seen with focal areas of fatty sparing adjacent to the gallbladder fossa. Gallbladder is unremarkable. No evidence of biliary ductal dilatation. Pancreas: No mass or inflammatory process visualized on this unenhanced exam. Spleen:  Within normal limits in size. Adrenals/Urinary tract: Mild left hydroureteronephrosis is seen due to a 4 mm calculus in the mid left ureter. A few tiny 2-3 mm left renal calculi are also noted. Unremarkable unopacified urinary bladder. Stomach/Bowel: No evidence of obstruction, inflammatory process, or abnormal fluid collections. Normal appendix visualized. Vascular/Lymphatic: No pathologically enlarged lymph nodes identified. No evidence of abdominal aortic aneurysm. Aortic atherosclerosis. Reproductive:  No mass or other significant abnormality. Other:  None. Musculoskeletal:  No suspicious bone lesions identified. IMPRESSION: 1. Mild left hydroureteronephrosis due to 4 mm mid left ureteral calculus. 2. Tiny left renal calculi. 3. Severe hepatic steatosis. Aortic Atherosclerosis (ICD10-I70.0). Electronically Signed   By: Earle Gell M.D.   On: 09/01/2018 18:21        Scheduled Meds: . budesonide (PULMICORT) nebulizer solution  0.25 mg Nebulization BID  . Chlorhexidine Gluconate Cloth  6 each Topical Daily  . mouth rinse  15 mL Mouth Rinse BID  . mupirocin ointment  1 application Nasal BID  . sodium chloride flush  3 mL Intravenous Q12H   Continuous Infusions: . cefTRIAXone (ROCEPHIN)  IV       LOS: 0 days    Time spent: 35 mins.More than 50% of that time was spent in counseling and/or coordination of  care.      Shelly Coss, MD [facility] Pager [phone]  If 7PM-7AM, please contact night-coverage [email or web address] Password [facility] 09/02/2018, 10:56 AM

## 2018-09-02 NOTE — Progress Notes (Signed)
Pt states she does not wear her cpap at home regularly and does not want to wear one tonight.  Pt prefers to wear the nasal cannula instead, currently at 3lpm.   Pt was advised that RT is available all night should she change her mind.  RN aware.

## 2018-09-03 ENCOUNTER — Encounter (HOSPITAL_COMMUNITY): Payer: Self-pay | Admitting: Urology

## 2018-09-03 DIAGNOSIS — J9811 Atelectasis: Secondary | ICD-10-CM | POA: Diagnosis not present

## 2018-09-03 DIAGNOSIS — G4734 Idiopathic sleep related nonobstructive alveolar hypoventilation: Secondary | ICD-10-CM | POA: Diagnosis not present

## 2018-09-03 DIAGNOSIS — R0902 Hypoxemia: Secondary | ICD-10-CM | POA: Diagnosis not present

## 2018-09-03 LAB — CBC WITH DIFFERENTIAL/PLATELET
Abs Immature Granulocytes: 0.1 10*3/uL — ABNORMAL HIGH (ref 0.00–0.07)
Basophils Absolute: 0.1 10*3/uL (ref 0.0–0.1)
Basophils Relative: 0 %
Eosinophils Absolute: 0 10*3/uL (ref 0.0–0.5)
Eosinophils Relative: 0 %
HCT: 46 % (ref 36.0–46.0)
Hemoglobin: 14.5 g/dL (ref 12.0–15.0)
Immature Granulocytes: 1 %
Lymphocytes Relative: 13 %
Lymphs Abs: 2 10*3/uL (ref 0.7–4.0)
MCH: 35.4 pg — ABNORMAL HIGH (ref 26.0–34.0)
MCHC: 31.5 g/dL (ref 30.0–36.0)
MCV: 112.2 fL — ABNORMAL HIGH (ref 80.0–100.0)
Monocytes Absolute: 1.5 10*3/uL — ABNORMAL HIGH (ref 0.1–1.0)
Monocytes Relative: 10 %
Neutro Abs: 12.1 10*3/uL — ABNORMAL HIGH (ref 1.7–7.7)
Neutrophils Relative %: 76 %
Platelets: 156 10*3/uL (ref 150–400)
RBC: 4.1 MIL/uL (ref 3.87–5.11)
RDW: 15 % (ref 11.5–15.5)
WBC: 15.8 10*3/uL — ABNORMAL HIGH (ref 4.0–10.5)
nRBC: 0 % (ref 0.0–0.2)

## 2018-09-03 LAB — BASIC METABOLIC PANEL
Anion gap: 4 — ABNORMAL LOW (ref 5–15)
BUN: 16 mg/dL (ref 6–20)
CO2: 29 mmol/L (ref 22–32)
Calcium: 8.2 mg/dL — ABNORMAL LOW (ref 8.9–10.3)
Chloride: 108 mmol/L (ref 98–111)
Creatinine, Ser: 0.89 mg/dL (ref 0.44–1.00)
GFR calc Af Amer: >60 mL/min (ref >60)
GFR calc non Af Amer: >60 mL/min (ref >60)
Glucose, Bld: 117 mg/dL — ABNORMAL HIGH (ref 70–99)
Potassium: 3.8 mmol/L (ref 3.5–5.1)
Sodium: 141 mmol/L (ref 135–145)

## 2018-09-03 NOTE — Progress Notes (Signed)
2 Days Post-Op Subjective: Doing well this morning.  Denies flank pain or issues urinating.  WBC improving and renal function improving.  Objective: Vital signs in last 24 hours: Temp:  [97 F (36.1 C)-98.4 F (36.9 C)] 98.3 F (36.8 C) (04/29 0327) Pulse Rate:  [72-88] 80 (04/29 0327) Resp:  [12-20] 18 (04/29 0327) BP: (102-149)/(70-93) 102/70 (04/29 0327) SpO2:  [82 %-100 %] 83 % (04/29 0807)  Intake/Output from previous day: 04/28 0701 - 04/29 0700 In: 747.7 [P.O.:480; I.V.:267.7] Out: 1250 [Urine:1250]  Intake/Output this shift: No intake/output data recorded.  Physical Exam:  General: Alert and oriented CV: RRR, palpable distal pulses Lungs: CTAB, equal chest rise Abdomen: Soft, NTND, no rebound or guarding Ext: NT, No erythema  Lab Results: Recent Labs    09/01/18 1752 09/02/18 0802 09/03/18 0510  HGB 16.9* 15.5* 14.5  HCT 51.3* 48.7* 46.0   BMET Recent Labs    09/01/18 1752 09/03/18 0510  NA 135 141  K 3.9 3.8  CL 98 108  CO2 26 29  GLUCOSE 136* 117*  BUN 12 16  CREATININE 1.08* 0.89  CALCIUM 8.6* 8.2*     Studies/Results: Dg C-arm 1-60 Min-no Report  Result Date: 09/01/2018 Fluoroscopy was utilized by the requesting physician.  No radiographic interpretation.   Ct Renal Stone Study  Result Date: 09/01/2018 CLINICAL DATA:  Left flank pain and vomiting beginning today. Nephrolithiasis. EXAM: CT ABDOMEN AND PELVIS WITHOUT CONTRAST TECHNIQUE: Multidetector CT imaging of the abdomen and pelvis was performed following the standard protocol without IV contrast. COMPARISON:  None. FINDINGS: Lower chest: No acute findings. Hepatobiliary: No mass visualized on this unenhanced exam. Severe hepatic steatosis is seen with focal areas of fatty sparing adjacent to the gallbladder fossa. Gallbladder is unremarkable. No evidence of biliary ductal dilatation. Pancreas: No mass or inflammatory process visualized on this unenhanced exam. Spleen:  Within normal limits  in size. Adrenals/Urinary tract: Mild left hydroureteronephrosis is seen due to a 4 mm calculus in the mid left ureter. A few tiny 2-3 mm left renal calculi are also noted. Unremarkable unopacified urinary bladder. Stomach/Bowel: No evidence of obstruction, inflammatory process, or abnormal fluid collections. Normal appendix visualized. Vascular/Lymphatic: No pathologically enlarged lymph nodes identified. No evidence of abdominal aortic aneurysm. Aortic atherosclerosis. Reproductive:  No mass or other significant abnormality. Other:  None. Musculoskeletal:  No suspicious bone lesions identified. IMPRESSION: 1. Mild left hydroureteronephrosis due to 4 mm mid left ureteral calculus. 2. Tiny left renal calculi. 3. Severe hepatic steatosis. Aortic Atherosclerosis (ICD10-I70.0). Electronically Signed   By: Earle Gell M.D.   On: 09/01/2018 18:21    Assessment/Plan:  POD2 s/p left JJ stent placement  Obstructing left ureteral stone with urosepsis  -Urine culture is growing G (-) rods.  Final C&S pending.  Continue empiric Rocephin.  Clinically improving -I will schedule her OP ureteroscopy ~2 weeks -Please call with any questions   LOS: 1 day   Ellison Hughs, MD [facility] Pager: [phone]  09/03/2018, 8:39 AM

## 2018-09-03 NOTE — Progress Notes (Addendum)
PROGRESS NOTE    Rebecca Moreno  NID:[id number] DOB: [birth year] DOA: 09/01/2018 PCP: [name], DO (Confirm with patient/family/NH records and if not entered, this HAS to be entered at [facility] point of entry. "No PCP" if truly none.)   Brief Narrative: (Start on day 1 of progress note - keep it brief and live) Patient is a 53 year old female with history of bipolar disorder, asthma, nephrolithiasis, hypothyroidism who presented to the emergency department for the evaluation of severe left flank pain.  She was also experiencing chills, nausea and vomiting at home.  Patient reported that she was treated for urinary tract infection couple of weeks ago with Keflex and was completely resolved.  She was febrile on presentation, tachycardic but blood pressure was within normal limits.  She had leukocytosis and urine analysis was suspicious for urinary tract infection.  CT abdomen/pelvis showed mild left hydroureteronephrosis and 4 mm mid left ureteral calculus.  Urology consulted and she underwent cystoscopy with left-sided JJ stent placement.  Urine culture, blood cultures growing gram-negative rods, final cultures pending.  Currently on IV Rocephin.   Assessment & Plan:   Principal Problem:   Obstructive pyelonephritis Active Problems:   Bipolar disorder, in partial remission, most recent episode mixed ([place])   Acquired hypothyroidism   Sepsis secondary to obstructive left-sided pyelonephritis: Clinical presentation as above.CT abdomen/pelvis showed mild left hydroureteronephrosis and 4 mm mid left ureteral calculus.  Urology consulted and she underwent cystoscopy with left-sided JJ stent placement.  Urine culture growing gram-negative rods, final cultures pending, blood cultures with no growth so far. Currently on ceftriaxone.  We will follow-up cultures and continue current antibiotics for now and follow cultures and tailor antibiotics accordingly.  Leukocytosis improving.  Afebrile.   Hemodynamically stable.  Hypoxia: Likely secondary to atelectasis.  Asked nursing staff to provide I-S and educate the patient.  Advised patient to use it every hour when awake.  Also advised to be out of the bed as much as possible to avoid atelectasis.  Trying to wean oxygen.  Bipolar disorder: Continue home medications. Resumed today.  Hypothyroidism: Continue Synthyroid  Asthma: Currently stable.  Lactic acidosis: Resolved with IV fluids.  Macrocytosis: Hemoglobin stable.Monitor as outpatient.    DVT prophylaxis: SCD Code Status: Full code Family Communication: None, patient competent herself Disposition Plan: Potential discharge tomorrow.  Patient hypoxic and final culture reports also pending.  Patient not comfortable going home with oxygen.   Consultants:   Urology  Procedures: JJ stent placement    Antimicrobials: IV Rocephin and started on 09/01/2018      Subjective: Patient seen and examined.  She states that she feels better today but is still had some left upper quadrant abdominal pain.  Denies any shortness of breath.  Looks slightly anxious.  Hypoxic with 83% oxygen saturation on room air.  Objective: Vitals:   09/03/18 0327 09/03/18 0807 09/03/18 0851 09/03/18 0854  BP: 102/70     Pulse: 80     Resp: 18     Temp: 98.3 F (36.8 C)     TempSrc: Oral     SpO2: 91% (!) 83% (!) 84% 90%  Weight:      Height:        Intake/Output Summary (Last 24 hours) at 09/03/2018 0858 Last data filed at 09/02/2018 1824 Gross per 24 hour  Intake 370 ml  Output 800 ml  Net -430 ml   Filed Weights   09/01/18 1659 09/02/18 0100  Weight: 99.8 kg 103 kg  Examination:  General exam: Appears slightly anxious but comfortable. Respiratory system: Clear to auscultation. Respiratory effort normal. Cardiovascular system: S1 & S2 heard, RRR. No JVD, murmurs, rubs, gallops or clicks. No pedal edema. Gastrointestinal system: Abdomen is nondistended, soft and  nontender. No organomegaly or masses felt. Normal bowel sounds heard.  Left CVA tenderness Central nervous system: Alert and oriented. No focal neurological deficits. Extremities: Symmetric 5 x 5 power. Skin: No rashes, lesions or ulcers Psychiatry: Judgement and insight appear normal. Mood & affect appropriate.     Data Reviewed: I have personally reviewed following labs and imaging studies  CBC: Recent Labs  Lab 09/01/18 1752 09/02/18 0802 09/03/18 0510  WBC 22.4* 34.7* 15.8*  NEUTROABS 20.9* 31.0* 12.1*  HGB 16.9* 15.5* 14.5  HCT 51.3* 48.7* 46.0  MCV 107.1* 110.2* 112.2*  PLT 207 175 193   Basic Metabolic Panel: Recent Labs  Lab 09/01/18 1752 09/03/18 0510  NA 135 141  K 3.9 3.8  CL 98 108  CO2 26 29  GLUCOSE 136* 117*  BUN 12 16  CREATININE 1.08* 0.89  CALCIUM 8.6* 8.2*   GFR: Estimated Creatinine Clearance: 88.6 mL/min (by C-G formula based on SCr of 0.89 mg/dL). Liver Function Tests: Recent Labs  Lab 09/01/18 1752  AST 31  ALT 40  ALKPHOS 110  BILITOT 0.9  PROT 6.5  ALBUMIN 3.9   Recent Labs  Lab 09/01/18 1752  LIPASE 26   No results for input(s): AMMONIA in the last 168 hours. Coagulation Profile: No results for input(s): INR, PROTIME in the last 168 hours. Cardiac Enzymes: No results for input(s): CKTOTAL, CKMB, CKMBINDEX, TROPONINI in the last 168 hours. BNP (last 3 results) No results for input(s): PROBNP in the last 8760 hours. HbA1C: No results for input(s): HGBA1C in the last 72 hours. CBG: No results for input(s): GLUCAP in the last 168 hours. Lipid Profile: No results for input(s): CHOL, HDL, LDLCALC, TRIG, CHOLHDL, LDLDIRECT in the last 72 hours. Thyroid Function Tests: No results for input(s): TSH, T4TOTAL, FREET4, T3FREE, THYROIDAB in the last 72 hours. Anemia Panel: No results for input(s): VITAMINB12, FOLATE, FERRITIN, TIBC, IRON, RETICCTPCT in the last 72 hours. Sepsis Labs: Recent Labs  Lab 09/01/18 2035 09/02/18 0227  09/02/18 0800  LATICACIDVEN 2.4* 2.1* 1.4    Recent Results (from the past 240 hour(s))  Urine culture     Status: Abnormal (Preliminary result)   Collection Time: 09/01/18  5:24 PM  Result Value Ref Range Status   Specimen Description   Final    URINE, RANDOM Performed at [facility], [address], [place], [place] [address]    Special Requests   Final    NONE Performed at [facility], [address], [place], [place] [address]    Culture >=100,000 COLONIES/mL GRAM NEGATIVE RODS (A)  Final   Report Status PENDING  Incomplete  Culture, blood (x 2)     Status: None (Preliminary result)   Collection Time: 09/01/18  9:41 PM  Result Value Ref Range Status   Specimen Description RIGHT ANTECUBITAL  Final   Special Requests   Final    BOTTLES DRAWN AEROBIC AND ANAEROBIC Blood Culture adequate volume   Culture   Final    NO GROWTH 2 DAYS Performed at [facility], [address], [place], [place] [address]    Report Status PENDING  Incomplete  Culture, blood (x 2)     Status: None (Preliminary result)   Collection Time: 09/01/18  9:45 PM  Result Value  Ref Range Status   Specimen Description BLOOD  Final   Special Requests NONE  Final   Culture   Final    NO GROWTH 2 DAYS Performed at [facility], [address] [place], [place] [address]    Report Status PENDING  Incomplete  MRSA PCR Screening     Status: Abnormal   Collection Time: 09/02/18  1:20 AM  Result Value Ref Range Status   MRSA by PCR POSITIVE (A) NEGATIVE Final    Comment:        The GeneXpert MRSA Assay (FDA approved for NASAL specimens only), is one component of a comprehensive MRSA colonization surveillance program. It is not intended to diagnose MRSA infection nor to guide or monitor treatment for MRSA infections. CRITICAL RESULT CALLED TO, READ BACK BY AND VERIFIED WITH: RN [facility]. AT 9678 09/02/18 CRUICKSHANK A Performed at [facility], [place] [address] [place], [place]  [address]          Radiology Studies: Dg C-arm 1-60 Min-no Report  Result Date: 09/01/2018 Fluoroscopy was utilized by the requesting physician.  No radiographic interpretation.   Ct Renal Stone Study  Result Date: 09/01/2018 CLINICAL DATA:  Left flank pain and vomiting beginning today. Nephrolithiasis. EXAM: CT ABDOMEN AND PELVIS WITHOUT CONTRAST TECHNIQUE: Multidetector CT imaging of the abdomen and pelvis was performed following the standard protocol without IV contrast. COMPARISON:  None. FINDINGS: Lower chest: No acute findings. Hepatobiliary: No mass visualized on this unenhanced exam. Severe hepatic steatosis is seen with focal areas of fatty sparing adjacent to the gallbladder fossa. Gallbladder is unremarkable. No evidence of biliary ductal dilatation. Pancreas: No mass or inflammatory process visualized on this unenhanced exam. Spleen:  Within normal limits in size. Adrenals/Urinary tract: Mild left hydroureteronephrosis is seen due to a 4 mm calculus in the mid left ureter. A few tiny 2-3 mm left renal calculi are also noted. Unremarkable unopacified urinary bladder. Stomach/Bowel: No evidence of obstruction, inflammatory process, or abnormal fluid collections. Normal appendix visualized. Vascular/Lymphatic: No pathologically enlarged lymph nodes identified. No evidence of abdominal aortic aneurysm. Aortic atherosclerosis. Reproductive:  No mass or other significant abnormality. Other:  None. Musculoskeletal:  No suspicious bone lesions identified. IMPRESSION: 1. Mild left hydroureteronephrosis due to 4 mm mid left ureteral calculus. 2. Tiny left renal calculi. 3. Severe hepatic steatosis. Aortic Atherosclerosis (ICD10-I70.0). Electronically Signed   By: Earle Gell M.D.   On: 09/01/2018 18:21        Scheduled Meds: . budesonide (PULMICORT) nebulizer solution  0.25 mg Nebulization BID  . cariprazine  6 mg Oral Daily  . Chlorhexidine Gluconate Cloth  6 each Topical Daily  .  lamoTRIgine  300 mg Oral QHS  . levothyroxine  50 mcg Oral Q0600  . mouth rinse  15 mL Mouth Rinse BID  . mupirocin ointment  1 application Nasal BID  . pantoprazole  40 mg Oral Daily  . QUEtiapine  300 mg Oral QHS  . sodium chloride flush  3 mL Intravenous Q12H  . venlafaxine XR  300 mg Oral Daily   Continuous Infusions: . cefTRIAXone (ROCEPHIN)  IV 1 g (09/02/18 2200)     LOS: 1 day    Time spent: [place], MD [facility] Pager [phone]  If 7PM-7AM, please contact night-coverage [email or web address] Password [facility] 09/03/2018, 8:58 AM

## 2018-09-03 NOTE — Progress Notes (Signed)
Rt placed pt on 2 LPM [place] due to low sats. MD at bedside and RN notified  of low sats.

## 2018-09-03 NOTE — Progress Notes (Signed)
Pt refused CPAP qhs.  Pt encouraged to contact RT should she change her mind.  

## 2018-09-04 DIAGNOSIS — J9811 Atelectasis: Secondary | ICD-10-CM

## 2018-09-04 DIAGNOSIS — N10 Acute tubulo-interstitial nephritis: Secondary | ICD-10-CM

## 2018-09-04 DIAGNOSIS — A4151 Sepsis due to Escherichia coli [E. coli]: Secondary | ICD-10-CM

## 2018-09-04 HISTORY — DX: Sepsis due to Escherichia coli (e. coli): A41.51

## 2018-09-04 LAB — BASIC METABOLIC PANEL
Anion gap: 7 (ref 5–15)
BUN: 14 mg/dL (ref 6–20)
CO2: 31 mmol/L (ref 22–32)
Calcium: 8.5 mg/dL — ABNORMAL LOW (ref 8.9–10.3)
Chloride: 102 mmol/L (ref 98–111)
Creatinine, Ser: 1.04 mg/dL — ABNORMAL HIGH (ref 0.44–1.00)
GFR calc Af Amer: >60 mL/min (ref >60)
GFR calc non Af Amer: >60 mL/min (ref >60)
Glucose, Bld: 192 mg/dL — ABNORMAL HIGH (ref 70–99)
Potassium: 4.2 mmol/L (ref 3.5–5.1)
Sodium: 140 mmol/L (ref 135–145)

## 2018-09-04 LAB — URINE CULTURE: Culture: >=100000 — AB

## 2018-09-04 LAB — CBC WITH DIFFERENTIAL/PLATELET
Abs Immature Granulocytes: 0.07 10*3/uL (ref 0.00–0.07)
Basophils Absolute: 0 10*3/uL (ref 0.0–0.1)
Basophils Relative: 1 %
Eosinophils Absolute: 0 10*3/uL (ref 0.0–0.5)
Eosinophils Relative: 0 %
HCT: 46.9 % — ABNORMAL HIGH (ref 36.0–46.0)
Hemoglobin: 14.5 g/dL (ref 12.0–15.0)
Immature Granulocytes: 1 %
Lymphocytes Relative: 18 %
Lymphs Abs: 1.5 10*3/uL (ref 0.7–4.0)
MCH: 34.4 pg — ABNORMAL HIGH (ref 26.0–34.0)
MCHC: 30.9 g/dL (ref 30.0–36.0)
MCV: 111.1 fL — ABNORMAL HIGH (ref 80.0–100.0)
Monocytes Absolute: 0.9 10*3/uL (ref 0.1–1.0)
Monocytes Relative: 11 %
Neutro Abs: 5.6 10*3/uL (ref 1.7–7.7)
Neutrophils Relative %: 69 %
Platelets: 176 10*3/uL (ref 150–400)
RBC: 4.22 MIL/uL (ref 3.87–5.11)
RDW: 14.9 % (ref 11.5–15.5)
WBC: 8 10*3/uL (ref 4.0–10.5)
nRBC: 0 % (ref 0.0–0.2)

## 2018-09-04 MED ORDER — LEVOFLOXACIN 750 MG PO TABS: 750.0000 mg | ORAL_TABLET | Freq: Every day | ORAL | 0 refills | Status: AC

## 2018-09-04 MED ORDER — LEVOFLOXACIN 750 MG PO TABS
750.0000 mg | ORAL_TABLET | Freq: Every day | ORAL | Status: DC
  Administered 2018-09-04: 750 mg via ORAL
  Filled 2018-09-04: qty 1

## 2018-09-04 NOTE — Discharge Instructions (Signed)
Pyelonephritis, Adult    Pyelonephritis is a kidney infection. The kidneys are organs that help clean your blood by moving waste out of your blood and into your pee (urine). This infection can happen quickly, or it can last for a long time. In most cases, it clears up with treatment and does not cause other problems.  Follow these instructions at home:  Medicines  · Take over-the-counter and prescription medicines only as told by your doctor.  · Take your antibiotic medicine as told by your doctor. Do not stop taking the medicine even if you start to feel better.  General instructions  · Drink enough fluid to keep your pee clear or pale yellow.  · Avoid caffeine, tea, and carbonated drinks.  · Pee (urinate) often. Avoid holding in pee for long periods of time.  · Pee before and after sex.  · After pooping (having a bowel movement), women should wipe from front to back. Use each tissue only once.  · Keep all follow-up visits as told by your doctor. This is important.  Contact a doctor if:  · You do not feel better after 2 days.  · Your symptoms get worse.  · You have a fever.  Get help right away if:  · You cannot take your medicine or drink fluids as told.  · You have chills and shaking.  · You throw up (vomit).  · You have very bad pain in your side (flank) or back.  · You feel very weak or you pass out (faint).  This information is not intended to replace advice given to you by your health care provider. Make sure you discuss any questions you have with your health care provider.  Document Released: 05/31/2004 Document Revised: 09/29/2015 Document Reviewed: 08/16/2014  Elsevier Interactive Patient Education © 2019 Elsevier Inc.

## 2018-09-04 NOTE — Progress Notes (Signed)
SATURATION QUALIFICATIONS: (This note is used to comply with regulatory documentation for home oxygen)  Patient Saturations on Room Air at Rest = 95%  Patient Saturations on Room Air while Ambulating = 93%  Patient Saturations on N/A Liters of oxygen while Ambulating = N/A   Please briefly explain why patient needs home oxygen: patient ambulated successfully on room air staying above 93% at all times, requires no oxygen for home at this time.

## 2018-09-04 NOTE — Discharge Summary (Signed)
Physician Discharge Summary  Rebecca Moreno:[id number] DOB: 1965-11-10 DOA: 09/01/2018  PCP: [name], DO  Admit date: 09/01/2018 Discharge date: 09/04/2018  Admitted From: Home Disposition: Home/self-care  Recommendations for Outpatient Follow-up:  1. Follow up with PCP in 1-2 weeks 2. Please obtain BMP/CBC in one week 3. Please follow up on the following pending results:  Home Health: None Equipment/Devices: None  Discharge Condition: Stable CODE STATUS: Full Diet recommendation: Heart healthy  Brief/Interim Summary: Patient is a 53 year old female with history of bipolar disorder, asthma, nephrolithiasis, hypothyroidism who presented to the emergency department for the evaluation of severe left flank pain. She was also experiencing chills, nausea and vomiting at home. Patient reported that she was treated for urinary tract infection couple of weeks ago with Keflex and was completely resolved. She was febrile on presentation, tachycardic but blood pressure was within normal limits. She had leukocytosis and urine analysis was suspicious forurinary tract infection. CT abdomen/pelvis showed mild left hydroureteronephrosis and 4 mm mid left ureteral calculus. Patient was admitted to hospital service due to sepsis secondary to UTI/pyelonephritis and kidney stone.  Urology consulted and she underwent cystoscopy withleft-sided JJ stent placement.  She was started on IV Rocephin empirically.  Urine and blood culture were drawn.  Subsequently her urine culture grew E. coli which was pansensitive and blood culture remained negative thus far.  Her sepsis parameters have resolved since more than 24 hours.  She was feeling much better yesterday however she was hypoxic requiring 2 L of oxygen which was thought to be secondary to atelectasis.  She was given incentive spirometry and encouraged to use it as recommended.  When seen today, she is feeling much better.  She walked with the  nursing staff on room air and was saturating at 93%.  She is not requiring any oxygen at this point in time.  Her pain is controlled.  She is afebrile.  Due to her E. coli in the urine being pansensitive, her antibiotics were switched to Levaquin and was given 1 dose of 750 mg p.o. today.  She is being discharged on 4 more doses starting tomorrow.  She will follow with PCP as well as urology as an outpatient within 1 week.  Discharge Diagnoses:  Principal Problem:   Obstructive pyelonephritis Active Problems:   Bipolar disorder, in partial remission, most recent episode mixed ([place])   Acquired hypothyroidism   Hypoxia   Atelectasis   Sepsis due to Escherichia coli (E. coli) [facility])    Discharge Instructions  Discharge Instructions    Discharge patient   Complete by:  As directed    Discharge disposition:  01-Home or Self Care   Discharge patient date:  09/04/2018     Allergies as of 09/04/2018      Reactions   Lithium Other (See Comments)   Psoriasis       Medication List    TAKE these medications   albuterol 108 (90 Base) MCG/ACT inhaler Commonly known as:  VENTOLIN HFA Inhale 1 puff into the lungs every 6 (six) hours as needed for wheezing or shortness of breath.   CALCIUM CITRATE-VITAMIN D3 PO Take 1 tablet by mouth daily.   Cariprazine HCl 6 MG Caps Commonly known as:  Vraylar Take 1 capsule (6 mg total) by mouth daily.   Dexilant 30 MG capsule Generic drug:  Dexlansoprazole Take 30 mg by mouth daily.   famotidine 20 MG tablet Commonly known as:  PEPCID Take 20 mg by mouth daily.  fluticasone 220 MCG/ACT inhaler Commonly known as:  FLOVENT HFA Inhale 1 puff into the lungs daily.   fluticasone 50 MCG/ACT nasal spray Commonly known as:  FLONASE Place 2 sprays into both nostrils daily.   lamoTRIgine 100 MG tablet Commonly known as:  LAMICTAL Take 3 tablets (300 mg total) by mouth at bedtime.   levofloxacin 750 MG tablet Commonly known as:  LEVAQUIN Take 1  tablet (750 mg total) by mouth daily for 4 days. Start taking on:  Sep 05, 2018   levothyroxine 50 MCG tablet Commonly known as:  SYNTHROID Take 50 mcg by mouth daily before breakfast.   loratadine 10 MG tablet Commonly known as:  CLARITIN Take 1 tablet (10 mg total) by mouth daily.   magnesium gluconate 500 MG tablet Commonly known as:  MAGONATE Take 500 mg by mouth daily.   MULTIVITAMIN WOMEN PO Take 1 tablet by mouth daily.   oxybutynin 5 MG tablet Commonly known as:  DITROPAN Take 1 tablet (5 mg total) by mouth every 8 (eight) hours as needed for bladder spasms.   phenazopyridine 200 MG tablet Commonly known as:  Pyridium Take 1 tablet (200 mg total) by mouth 3 (three) times daily as needed (for pain with urination).   QUEtiapine 300 MG tablet Commonly known as:  SEROQUEL Take 1 tablet (300 mg total) by mouth at bedtime.   traMADol 50 MG tablet Commonly known as:  Ultram Take 1 tablet (50 mg total) by mouth every 6 (six) hours as needed for up to 3 days.   venlafaxine XR 150 MG 24 hr capsule Commonly known as:  EFFEXOR-XR Take 2 capsules (300 mg total) by mouth daily.   Vitamin D3 125 MCG (5000 UT) Tabs Take 5,000 Units by mouth daily.      Follow-up Information    Ceasar Mons, MD In 1 week.   Specialty:  Urology Why:  Kidney stone follow-up Contact information: [address] [place] [address] [place], [place], DO Follow up in 1 week(s).   Specialty:  Family Medicine Contact information: [address] [place] [address] [phone]          Allergies  Allergen Reactions  . Lithium Other (See Comments)    Psoriasis     Consultations:  Urology/Dr. Ellison Hughs   Procedures/Studies: Dg C-arm 1-60 Min-no Report  Result Date: 09/01/2018 Fluoroscopy was utilized by the requesting physician.  No radiographic interpretation.   Ct Renal Stone Study  Result Date:  09/01/2018 CLINICAL DATA:  Left flank pain and vomiting beginning today. Nephrolithiasis. EXAM: CT ABDOMEN AND PELVIS WITHOUT CONTRAST TECHNIQUE: Multidetector CT imaging of the abdomen and pelvis was performed following the standard protocol without IV contrast. COMPARISON:  None. FINDINGS: Lower chest: No acute findings. Hepatobiliary: No mass visualized on this unenhanced exam. Severe hepatic steatosis is seen with focal areas of fatty sparing adjacent to the gallbladder fossa. Gallbladder is unremarkable. No evidence of biliary ductal dilatation. Pancreas: No mass or inflammatory process visualized on this unenhanced exam. Spleen:  Within normal limits in size. Adrenals/Urinary tract: Mild left hydroureteronephrosis is seen due to a 4 mm calculus in the mid left ureter. A few tiny 2-3 mm left renal calculi are also noted. Unremarkable unopacified urinary bladder. Stomach/Bowel: No evidence of obstruction, inflammatory process, or abnormal fluid collections. Normal appendix visualized. Vascular/Lymphatic: No pathologically enlarged lymph nodes identified. No evidence of abdominal aortic aneurysm. Aortic atherosclerosis. Reproductive:  No  mass or other significant abnormality. Other:  None. Musculoskeletal:  No suspicious bone lesions identified. IMPRESSION: 1. Mild left hydroureteronephrosis due to 4 mm mid left ureteral calculus. 2. Tiny left renal calculi. 3. Severe hepatic steatosis. Aortic Atherosclerosis (ICD10-I70.0). Electronically Signed   By: Earle Gell M.D.   On: 09/01/2018 18:21      Subjective: Patient seen and examined.  Feels much better today.  No complaint.   Discharge Exam: Vitals:   09/03/18 2043 09/04/18 0450  BP: (!) 147/104 119/81  Pulse: 84 93  Resp: 17 17  Temp: 98.3 F (36.8 C) 99.1 F (37.3 C)  SpO2: 90% 94%   Vitals:   09/03/18 1600 09/03/18 2014 09/03/18 2043 09/04/18 0450  BP:   (!) 147/104 119/81  Pulse:   84 93  Resp:   17 17  Temp:   98.3 F (36.8 C) 99.1  F (37.3 C)  TempSrc:   Oral Oral  SpO2: 96% 96% 90% 94%  Weight:      Height:        General: Pt is alert, awake, not in acute distress Cardiovascular: RRR, S1/S2 +, no rubs, no gallops Respiratory: CTA bilaterally, no wheezing, no rhonchi Abdominal: Soft, NT, ND, bowel sounds + mild left CVA tenderness Extremities: no edema, no cyanosis    The results of significant diagnostics from this hospitalization (including imaging, microbiology, ancillary and laboratory) are listed below for reference.     Microbiology: Recent Results (from the past 240 hour(s))  Urine culture     Status: Abnormal   Collection Time: 09/01/18  5:24 PM  Result Value Ref Range Status   Specimen Description   Final    URINE, RANDOM Performed at [facility], [address], [place], [place] [address]    Special Requests   Final    NONE Performed at [facility], [address] [place], [place] [address]    Culture >=100,000 COLONIES/mL ESCHERICHIA COLI (A)  Final   Report Status 09/04/2018 FINAL  Final   Organism ID, Bacteria ESCHERICHIA COLI (A)  Final      Susceptibility   Escherichia coli - MIC*    AMPICILLIN <=2 SENSITIVE Sensitive     CEFAZOLIN <=4 SENSITIVE Sensitive     CEFTRIAXONE <=1 SENSITIVE Sensitive     CIPROFLOXACIN <=0.25 SENSITIVE Sensitive     GENTAMICIN <=1 SENSITIVE Sensitive     IMIPENEM <=0.25 SENSITIVE Sensitive     NITROFURANTOIN <=16 SENSITIVE Sensitive     TRIMETH/SULFA <=20 SENSITIVE Sensitive     AMPICILLIN/SULBACTAM <=2 SENSITIVE Sensitive     PIP/TAZO <=4 SENSITIVE Sensitive     Extended ESBL NEGATIVE Sensitive     * >=100,000 COLONIES/mL ESCHERICHIA COLI  Culture, blood (x 2)     Status: None (Preliminary result)   Collection Time: 09/01/18  9:41 PM  Result Value Ref Range Status   Specimen Description RIGHT ANTECUBITAL  Final   Special Requests   Final    BOTTLES DRAWN AEROBIC AND ANAEROBIC Blood Culture adequate volume   Culture   Final    NO GROWTH 3  DAYS Performed at [facility], [address], [place], [place] [address]    Report Status PENDING  Incomplete  Culture, blood (x 2)     Status: None (Preliminary result)   Collection Time: 09/01/18  9:45 PM  Result Value Ref Range Status   Specimen Description BLOOD  Final   Special Requests NONE  Final   Culture   Final    NO  GROWTH 3 DAYS Performed at [facility], [address], [place], [place] [address]    Report Status PENDING  Incomplete  MRSA PCR Screening     Status: Abnormal   Collection Time: 09/02/18  1:20 AM  Result Value Ref Range Status   MRSA by PCR POSITIVE (A) NEGATIVE Final    Comment:        The GeneXpert MRSA Assay (FDA approved for NASAL specimens only), is one component of a comprehensive MRSA colonization surveillance program. It is not intended to diagnose MRSA infection nor to guide or monitor treatment for MRSA infections. CRITICAL RESULT CALLED TO, READ BACK BY AND VERIFIED WITH: RN [facility] S. AT [id number] 09/02/18 CRUICKSHANK A Performed at [facility], [place] [address] [place], [place] [address]      Labs: BNP (last 3 results) No results for input(s): BNP in the last 8760 hours. Basic Metabolic Panel: Recent Labs  Lab 09/01/18 1752 09/03/18 0510  NA 135 141  K 3.9 3.8  CL 98 108  CO2 26 29  GLUCOSE 136* 117*  BUN 12 16  CREATININE 1.08* 0.89  CALCIUM 8.6* 8.2*   Liver Function Tests: Recent Labs  Lab 09/01/18 1752  AST 31  ALT 40  ALKPHOS 110  BILITOT 0.9  PROT 6.5  ALBUMIN 3.9   Recent Labs  Lab 09/01/18 1752  LIPASE 26   No results for input(s): AMMONIA in the last 168 hours. CBC: Recent Labs  Lab 09/01/18 1752 09/02/18 0802 09/03/18 0510  WBC 22.4* 34.7* 15.8*  NEUTROABS 20.9* 31.0* 12.1*  HGB 16.9* 15.5* 14.5  HCT 51.3* 48.7* 46.0  MCV 107.1* 110.2* 112.2*  PLT 207 175 156   Cardiac Enzymes: No results for input(s): CKTOTAL, CKMB, CKMBINDEX, TROPONINI in the last 168  hours. BNP: Invalid input(s): POCBNP CBG: No results for input(s): GLUCAP in the last 168 hours. D-Dimer No results for input(s): DDIMER in the last 72 hours. Hgb A1c No results for input(s): HGBA1C in the last 72 hours. Lipid Profile No results for input(s): CHOL, HDL, LDLCALC, TRIG, CHOLHDL, LDLDIRECT in the last 72 hours. Thyroid function studies No results for input(s): TSH, T4TOTAL, T3FREE, THYROIDAB in the last 72 hours.  Invalid input(s): FREET3 Anemia work up No results for input(s): VITAMINB12, FOLATE, FERRITIN, TIBC, IRON, RETICCTPCT in the last 72 hours. Urinalysis    Component Value Date/Time   COLORURINE YELLOW 09/01/2018 1724   APPEARANCEUR CLOUDY (A) 09/01/2018 1724   APPEARANCEUR Clear 07/22/2018 1535   LABSPEC 1.011 09/01/2018 1724   PHURINE 6.0 09/01/2018 1724   GLUCOSEU NEGATIVE 09/01/2018 1724   HGBUR MODERATE (A) 09/01/2018 1724   BILIRUBINUR NEGATIVE 09/01/2018 1724   BILIRUBINUR Negative 07/22/2018 1535   KETONESUR NEGATIVE 09/01/2018 1724   PROTEINUR NEGATIVE 09/01/2018 1724   NITRITE POSITIVE (A) 09/01/2018 1724   LEUKOCYTESUR LARGE (A) 09/01/2018 1724   Sepsis Labs Invalid input(s): PROCALCITONIN,  WBC,  LACTICIDVEN Microbiology Recent Results (from the past 240 hour(s))  Urine culture     Status: Abnormal   Collection Time: 09/01/18  5:24 PM  Result Value Ref Range Status   Specimen Description   Final    URINE, RANDOM Performed at [facility], [address] [place], [place] [address]    Special Requests   Final    NONE Performed at [facility], [address] [place], [place] [address]    Culture >=100,000 COLONIES/mL ESCHERICHIA COLI (A)  Final   Report Status 09/04/2018 FINAL  Final   Organism  ID, Bacteria ESCHERICHIA COLI (A)  Final      Susceptibility   Escherichia coli - MIC*    AMPICILLIN <=2 SENSITIVE Sensitive     CEFAZOLIN <=4 SENSITIVE Sensitive     CEFTRIAXONE <=1 SENSITIVE Sensitive     CIPROFLOXACIN <=0.25 SENSITIVE  Sensitive     GENTAMICIN <=1 SENSITIVE Sensitive     IMIPENEM <=0.25 SENSITIVE Sensitive     NITROFURANTOIN <=16 SENSITIVE Sensitive     TRIMETH/SULFA <=20 SENSITIVE Sensitive     AMPICILLIN/SULBACTAM <=2 SENSITIVE Sensitive     PIP/TAZO <=4 SENSITIVE Sensitive     Extended ESBL NEGATIVE Sensitive     * >=100,000 COLONIES/mL ESCHERICHIA COLI  Culture, blood (x 2)     Status: None (Preliminary result)   Collection Time: 09/01/18  9:41 PM  Result Value Ref Range Status   Specimen Description RIGHT ANTECUBITAL  Final   Special Requests   Final    BOTTLES DRAWN AEROBIC AND ANAEROBIC Blood Culture adequate volume   Culture   Final    NO GROWTH 3 DAYS Performed at [facility], [address] [place], [place] [address]    Report Status PENDING  Incomplete  Culture, blood (x 2)     Status: None (Preliminary result)   Collection Time: 09/01/18  9:45 PM  Result Value Ref Range Status   Specimen Description BLOOD  Final   Special Requests NONE  Final   Culture   Final    NO GROWTH 3 DAYS Performed at [facility], [address], [place], [place] [address]    Report Status PENDING  Incomplete  MRSA PCR Screening     Status: Abnormal   Collection Time: 09/02/18  1:20 AM  Result Value Ref Range Status   MRSA by PCR POSITIVE (A) NEGATIVE Final    Comment:        The GeneXpert MRSA Assay (FDA approved for NASAL specimens only), is one component of a comprehensive MRSA colonization surveillance program. It is not intended to diagnose MRSA infection nor to guide or monitor treatment for MRSA infections. CRITICAL RESULT CALLED TO, READ BACK BY AND VERIFIED WITH: RN [facility] S. AT 7416 09/02/18 CRUICKSHANK A Performed at [facility], [place] [address] [place], [place] [address]      Time coordinating discharge: Less than 30 minutes  SIGNED:   Darliss Cheney, MD  [facility] 09/04/2018, 9:37 AM Pager [phone]  If 7PM-7AM, please contact  night-coverage [email or web address] Password [email or web address]

## 2018-09-06 LAB — CULTURE, BLOOD (ROUTINE X 2)
Culture: NO GROWTH
Culture: NO GROWTH
Special Requests: ADEQUATE
Special Requests: ADEQUATE

## 2018-09-08 ENCOUNTER — Other Ambulatory Visit: Payer: Self-pay | Admitting: *Deleted

## 2018-09-08 NOTE — Patient Outreach (Signed)
[facility]) Care Management  09/08/2018  Rebecca Moreno 08/30/1965 [id number]   EMMI-general discharge- [facility]   RED ON [place] Day # 1 Date:  Saturday Sep 06 2018 1039  Red Alert Reason: Scheduled follow-up? No   Insurance: united health care medicare   [facility] admissions x 1 ED visits x 1 in the last 6 months  Last admission 09/01/18 to 09/04/18  sepsis secondary to UTI/pyelonephritis and kidney stone.  Outreach attempt # 1 Patient is able to verify HIPAA [facility] Care Management RN reviewed and addressed red alert with patient  Transition of care services noted to be completed by primary care MD office staff- [place]   Conditions:  sepsis secondary to UTI/pyelonephritis and kidney stone, bipolar disorder, asthma, nephrolithiasis, hypothyroidism .  Outreach attempt # 1 No answer. [facility] RN CM left HIPAA compliant voicemail message along with CM's contact info.   Plan: [facility] RN CM sent an unsuccessful outreach letter and scheduled this patient for another call attempt within 4 business days  Kimberly L. Lavina Hamman, RN, BSN, [place] Coordinator Office number [phone] Mobile number [phone]  Main [facility] number [phone] Fax number [phone]

## 2018-09-09 ENCOUNTER — Ambulatory Visit: Payer: Self-pay | Admitting: *Deleted

## 2018-09-09 ENCOUNTER — Other Ambulatory Visit: Payer: Self-pay | Admitting: Urology

## 2018-09-10 ENCOUNTER — Other Ambulatory Visit: Payer: Self-pay

## 2018-09-10 ENCOUNTER — Other Ambulatory Visit: Payer: Self-pay | Admitting: *Deleted

## 2018-09-10 NOTE — Patient Outreach (Signed)
[facility]) Care Management  09/10/2018  Rebecca Moreno August 06, 1965 [id number]   EMMI-general discharge- [facility]   RED ON [place] Day # 1 Date:  Saturday Sep 06 2018 1039  Red Alert Reason: Scheduled follow-up? No  And   RED ON EMMI ALERT Day#4 Date: Tuesday Sep 09 2018 1327 Red Alert Reason: Scheduled follow-up? No    Insurance: united health care medicare and medicaid [place] access  [facility] admissions x 1 ED visits x 1 in the last 6 months  Last admission 09/01/18 to 09/04/18 sepsis secondary to UTI/pyelonephritis and kidney stone.  Outreach attempt # 2 successful to the home number   Patient is able to verify HIPAA [place] Management RN reviewed and addressed red alertwith patient   Transition of care services noted to be completed by primary care MD office staff- [place]   EMMI:  Rebecca Moreno states the answer to the EMMI question on 09/06/18 was correct  But not correct on 09/09/18. Rebecca Moreno did confirm she has made an appointment with Rebecca Moreno for May 13 [address] for urology surgery related to her kidney stones She has not called the primary care MD office to make a hospital follow up appointment RN CM reviewed the after summary visit recommendations with her and discussed the importance of a follow up with her primary care MD after an acute visit to a hospital. She voiced understanding and states she will contact her primary MD independently and does not need assistance from [place]  She states she still has some mild burning and small tinges of blood with urination , but pain is manageable with her medications only She is looking forward to surgery for a reported stone that is "seven" mm  She lives at home with her husband, Rebecca Moreno.She also has a daughter.  She is independent with all care needs and denies concerns with transportation to medical appointments. She denies concerns with taking medications as prescribed, affording medications,  side effects of medications and questions about medications.  She denies need for assist with advance directives    Conditions: sepsis secondary to UTI/pyelonephritis and kidney stone, bipolar and mood disorder, asthma, nephrolithiasis, hypothyroidism, asthma, GERD, Psoriasis, osteoporosis, hx of colonic polyps, hx of rectal bleeding,   Consent: [facility] RN CM reviewed [facility] services with patient. Patient gave verbal consent for services [facility] telephonic RN CM.   Advised patient that there will be further automated EMMI- post discharge calls to assess how the patient is doing following the recent hospitalization Advised the patient that another call may be received from a nurse if any of their responses were abnormal. Patient voiced understanding and was appreciative of f/u call.   Plan: [facility] RN CM will close case at this time as patient has been assessed and no needs identified/needs resolved.   Pt encouraged to return a call to [place] CM prn  [facility] RN CM discussed the sent unsuccessful outreach letter with [facility] brochure enclosed for review  Routed to MDs  Rebecca Millin L. Lavina Hamman, RN, BSN, [place] Coordinator Office number [phone] Mobile number [phone]  Main [facility] number [phone] Fax number [phone]

## 2018-09-10 NOTE — Progress Notes (Deleted)
[place] MD/PA/NP OP Progress Note  09/10/2018 1:33 PM Rebecca Moreno  MRN:  [id number]  Chief Complaint:  HPI:  - She was admitted for urosepsis with urethral stone.   Visit Diagnosis: No diagnosis found.  Past Psychiatric History: Please see initial evaluation for full details. I have reviewed the history. No updates at this time.     Past Medical History:  Past Medical History:  Diagnosis Date  . Asthma   . Bipolar disorder ([place])   . Colon polyp   . Depression    Bipolar  . Gastric ulcer    around 2013.  stress related  . GERD (gastroesophageal reflux disease)   . History of kidney stones   . Hyperlipidemia   . Hypothyroid   . Sleep apnea     Past Surgical History:  Procedure Laterality Date  . ABLATION ON ENDOMETRIOSIS    . COLONOSCOPY     Per patient, done around 2013 in [place], had polyp and overdue for follow up.  . COLONOSCOPY WITH PROPOFOL N/A 05/12/2018   Procedure: COLONOSCOPY WITH PROPOFOL;  Surgeon: Daneil Dolin, MD;  [facility];  Service: Endoscopy;  Laterality: N/A;  12:00pm  . CYSTOSCOPY W/ URETERAL STENT PLACEMENT Left 09/01/2018   Procedure: CYSTOSCOPY WITH RETROGRADE PYELOGRAM/URETERAL STENT PLACEMENT;  Surgeon: Ceasar Mons, MD;  Location: [facility];  Service: Urology;  Laterality: Left;  . OVARIAN CYST SURGERY Left   . POLYPECTOMY  05/12/2018   Procedure: POLYPECTOMY;  Surgeon: Daneil Dolin, MD;  [facility];  Service: Endoscopy;;  polyp at splenic flexure  . TEMPOROMANDIBULAR JOINT SURGERY     9 surgeries  . TUBAL LIGATION      Family Psychiatric History: Please see initial evaluation for full details. I have reviewed the history. No updates at this time.     Family History:  Family History  Problem Relation Age of Onset  . Anxiety disorder Mother   . Hypertension Mother   . Heart failure Mother   . Stroke Mother   . Hyperlipidemia Father   . Diabetes Father   . Stroke Father   . Hypertension Father    . Colon polyps Father        older than 40  . Diabetes Sister   . Hypertension Sister   . Uterine cancer Sister   . Ovarian cancer Maternal Grandmother   . Colon cancer Maternal Grandmother   . Renal Disease Paternal Grandmother   . Bipolar disorder Other     Social History:  Social History   Socioeconomic History  . Marital status: Married    Spouse name: Not on file  . Number of children: 1  . Years of education: Not on file  . Highest education level: High school graduate  Occupational History  . Occupation: disability  Social Needs  . Financial resource strain: Not hard at all  . Food insecurity:    Worry: Never true    Inability: Never true  . Transportation needs:    Medical: No    Non-medical: No  Tobacco Use  . Smoking status: Former Smoker    Packs/day: 0.50    Years: 30.00    Pack years: 15.00    Types: Cigarettes    Last attempt to quit: 05/07/2012    Years since quitting: 6.3  . Smokeless tobacco: Never Used  Substance and Sexual Activity  . Alcohol use: Never    Frequency: Never  . Drug use: Not Currently  . Sexual  activity: Not Currently    Birth control/protection: Surgical    Comment: tubal  Lifestyle  . Physical activity:    Days per week: 0 days    Minutes per session: 0 min  . Stress: To some extent  Relationships  . Social connections:    Talks on phone: More than three times a week    Gets together: More than three times a week    Attends religious service: Never    Active member of club or organization: No    Attends meetings of clubs or organizations: Never    Relationship status: Married  Other Topics Concern  . Not on file  Social History Narrative  . Not on file    Allergies:  Allergies  Allergen Reactions  . Lithium Other (See Comments)    Psoriasis     Metabolic Disorder Labs: No results found for: HGBA1C, MPG No results found for: PROLACTIN Lab Results  Component Value Date   CHOL 268 (H) 01/15/2018   TRIG 218  (H) 01/15/2018   HDL 56 01/15/2018   CHOLHDL 4.8 (H) 01/15/2018   LDLCALC 168 (H) 01/15/2018   Lab Results  Component Value Date   TSH 1.550 04/16/2018   TSH 2.470 01/15/2018    Therapeutic Level Labs: No results found for: LITHIUM No results found for: VALPROATE No components found for:  CBMZ  Current Medications: Current Outpatient Medications  Medication Sig Dispense Refill  . albuterol (PROVENTIL HFA;VENTOLIN HFA) 108 (90 Base) MCG/ACT inhaler Inhale 1 puff into the lungs every 6 (six) hours as needed for wheezing or shortness of breath.    Marland Kitchen CALCIUM CITRATE-VITAMIN D3 PO Take 1 tablet by mouth daily.    . Cariprazine HCl (VRAYLAR) 6 MG CAPS Take 1 capsule (6 mg total) by mouth daily. 30 capsule 2  . Cholecalciferol (VITAMIN D3) 5000 units TABS Take 5,000 Units by mouth daily.     Marland Kitchen Dexlansoprazole (DEXILANT) 30 MG capsule Take 30 mg by mouth daily.    . famotidine (PEPCID) 20 MG tablet Take 20 mg by mouth daily.    . fluticasone (FLONASE) 50 MCG/ACT nasal spray Place 2 sprays into both nostrils daily. 16 g 6  . fluticasone (FLOVENT HFA) 220 MCG/ACT inhaler Inhale 1 puff into the lungs daily.     Marland Kitchen lamoTRIgine (LAMICTAL) 100 MG tablet Take 3 tablets (300 mg total) by mouth at bedtime. 90 tablet 2  . levothyroxine (SYNTHROID, LEVOTHROID) 50 MCG tablet Take 50 mcg by mouth daily before breakfast.    . loratadine (CLARITIN) 10 MG tablet Take 1 tablet (10 mg total) by mouth daily. 30 tablet 11  . magnesium gluconate (MAGONATE) 500 MG tablet Take 500 mg by mouth daily.    . Multiple Vitamins-Minerals (MULTIVITAMIN WOMEN PO) Take 1 tablet by mouth daily.    Marland Kitchen oxybutynin (DITROPAN) 5 MG tablet Take 1 tablet (5 mg total) by mouth every 8 (eight) hours as needed for bladder spasms. 30 tablet 1  . phenazopyridine (PYRIDIUM) 200 MG tablet Take 1 tablet (200 mg total) by mouth 3 (three) times daily as needed (for pain with urination). 30 tablet 0  . QUEtiapine (SEROQUEL) 300 MG tablet Take  1 tablet (300 mg total) by mouth at bedtime. 90 tablet 2  . venlafaxine XR (EFFEXOR-XR) 150 MG 24 hr capsule Take 2 capsules (300 mg total) by mouth daily. 60 capsule 2   No current facility-administered medications for this visit.      Musculoskeletal: Strength & Muscle Tone: N/A  Gait & Station: N/A Patient leans: N/A  Psychiatric Specialty Exam: ROS  Last menstrual period 12/13/2017.There is no height or weight on file to calculate BMI.  General Appearance: {Appearance:22683}  Eye Contact:  {BHH EYE CONTACT:22684}  Speech:  Clear and Coherent  Volume:  Normal  Mood:  {BHH MOOD:22306}  Affect:  {Affect (PAA):22687}  Thought Process:  Coherent  Orientation:  Full (Time, Place, and Person)  Thought Content: Logical   Suicidal Thoughts:  {ST/HT (PAA):22692}  Homicidal Thoughts:  {ST/HT (PAA):22692}  Memory:  Immediate;   Good  Judgement:  {Judgement (PAA):22694}  Insight:  {Insight (PAA):22695}  Psychomotor Activity:  Normal  Concentration:  Concentration: Good and Attention Span: Good  Recall:  Good  Fund of Knowledge: Good  Language: Good  Akathisia:  No  Handed:  Right  AIMS (if indicated): not done  Assets:  Communication Skills Desire for Improvement  ADL's:  Intact  Cognition: WNL  Sleep:  {BHH GOOD/FAIR/POOR:22877}   Screenings: GAD-7     Office Visit from 04/16/2018 in [place]  Total GAD-7 Score  3    Mini-Mental     Clinical Support from 07/22/2018 in [place]  Total Score (max 30 points )  30    PHQ2-9     Office Visit from 07/22/2018 in [place] Visit from 06/20/2018 in [place] Visit from 05/19/2018 in [place] Office Visit from 04/16/2018 in [place] Office Visit from 03/04/2018 in [place]  PHQ-2 Total Score  2  2  2  2  2   PHQ-9 Total Score  4  7  6  7  8         Assessment and Plan:  KALEIYAH POLSKY is a 53 y.o. year old female with a history of bipolar disorder by history, hypothyroidism , who presents for follow up appointment for No diagnosis found.  # Unspecified mood disorder # Bipolar I disorder by history # r/o MDD with psychotic features Exam is notable for less emotional lability, and patient reports overall improvement in depressive symptoms since the last visit.  Psychosocial stressors including marital conflict, and she has very low self-esteem.  Will continue venlafaxine to target depression.  Will continue lamotrigine to target mood dysregulation.  Discussed risk of Stevens-Johnson syndrome.  Will continue Vraylar and quetiapine to target mood dysregulation.  Although it is preferable to do monotherapy, she has a history of relapse in her symptoms monotherapy.  Discussed potential metabolic side effect and EPS.  Will continue clonazepam for anxiety.  Discussed risk of dependence and oversedation.  Noted that although the patient was diagnosed with bipolar disorder in the past, the patient and her husband denies any manic/hypomanic episode except she had mild irritability, paranoia in the context of depression.  Will continue to monitor.   Plan  1. Continue Venlafaxine 300 mg daily  2. Continue Lamotrigine 300 mg daily  3. Continue Vraylar 6 mg  4. Continue Quetiapine 300 mg at night  5. Continue Clonazepam 1 mg twice a dayas needed for anxiety 6. Referral to therapy  7.Return to clinic in three months for 15 mins 8. Obtain record from your previous psychiatrist  Past trials of medication:sertraline, fluoxetine, lexapro,Celexa, duloxetine,Wellbutrinlithium (psoriasis), Depakote(did not work), olanzapine, Abilify(weight gain), latuda, Geodon  The patient demonstrates the following risk factors for suicide: Chronic risk factors for suicide include:psychiatric disorder ofdepression. Acute risk factorsfor suicide  include: family or  marital conflict and unemployment. Protective factorsfor this patient include: positive social support and hope for the future. Considering these factors, the overall suicide risk at this point appears to below. Patientisappropriate for outpatient follow up.  Norman Clay, MD 09/10/2018, 1:33 PM

## 2018-09-11 ENCOUNTER — Other Ambulatory Visit: Payer: Self-pay | Admitting: Urology

## 2018-09-11 NOTE — Patient Instructions (Signed)
KANNA DAFOE    Your procedure is scheduled on: 09-17-2018  Report to [facility] Main  Entrance  Report to admitting at 930 AM    Call this number if you have problems the morning of surgery [phone]   Remember: Do not eat food or drink liquids :After Midnight. BRUSH YOUR TEETH MORNING OF SURGERY AND RINSE YOUR MOUTH OUT, NO CHEWING GUM CANDY OR MINTS.   GO TO  EDUCATION CENTER ENTRANCE FOR YOUR DRIVE UP COVID 19 TEST   Take these medicines the morning of surgery with A SIP OF WATER: ALBUTEROL INHALER IF NEEDED AND BRING INHALER, FLOVENT, VENLAFAXINE (EFFEXOR), LORATADINE (CLARITIN), CARIPRAZINE (VRAYLAr0, LEVOTHYROXINE (SYNTHROID), QUETIAPINE (SEROQUEL), DEXILANT              You may not have any metal on your body including hair pins and              piercings  Do not wear jewelry, make-up, lotions, powders or perfumes, deodorant             Do not wear nail polish.  Do not shave  48 hours prior to surgery.                 Do not bring valuables to the hospital. [place].  Contacts, dentures or bridgework may not be worn into surgery.  Leave suitcase in the car. After surgery it may be brought to your room.     Patients discharged the day of surgery will not be allowed to drive home. IF YOU ARE HAVING SURGERY AND GOING HOME THE SAME DAY, YOU MUST HAVE AN ADULT TO DRIVE YOU HOME AND BE WITH YOU FOR 24 HOURS. YOU MAY GO HOME BY TAXI OR UBER OR ORTHERWISE, BUT AN ADULT MUST ACCOMPANY YOU HOME AND STAY WITH YOU FOR 24 HOURS.  Name and phone number of your driver:  Special Instructions: N/A              Please read over the following fact sheets you were given: _____________________________________________________________________             [facility] - Preparing for Surgery Before surgery, you can play an important role.  Because skin is not sterile, your skin needs to be as free of germs  as possible.  You can reduce the number of germs on your skin by washing with CHG (chlorahexidine gluconate) soap before surgery.  CHG is an antiseptic cleaner which kills germs and bonds with the skin to continue killing germs even after washing. Please DO NOT use if you have an allergy to CHG or antibacterial soaps.  If your skin becomes reddened/irritated stop using the CHG and inform your nurse when you arrive at Short Stay. Do not shave (including legs and underarms) for at least 48 hours prior to the first CHG shower.  You may shave your face/neck. Please follow these instructions carefully:  1.  Shower with CHG Soap the night before surgery and the  morning of Surgery.  2.  If you choose to wash your hair, wash your hair first as usual with your  normal  shampoo.  3.  After you shampoo, rinse your hair and body thoroughly to remove the  shampoo.  4.  Use CHG as you would any other liquid soap.  You can apply chg directly  to the skin and wash                       Gently with a scrungie or clean washcloth.  5.  Apply the CHG Soap to your body ONLY FROM THE NECK DOWN.   Do not use on face/ open                           Wound or open sores. Avoid contact with eyes, ears mouth and genitals (private parts).                       Wash face,  Genitals (private parts) with your normal soap.             6.  Wash thoroughly, paying special attention to the area where your surgery  will be performed.  7.  Thoroughly rinse your body with warm water from the neck down.  8.  DO NOT shower/wash with your normal soap after using and rinsing off  the CHG Soap.                9.  Pat yourself dry with a clean towel.            10.  Wear clean pajamas.            11.  Place clean sheets on your bed the night of your first shower and do not  sleep with pets. Day of Surgery : Do not apply any lotions/deodorants the morning of surgery.  Please wear clean clothes to the hospital/surgery  center.  FAILURE TO FOLLOW THESE INSTRUCTIONS MAY RESULT IN THE CANCELLATION OF YOUR SURGERY PATIENT SIGNATURE_________________________________  NURSE SIGNATURE__________________________________  ________________________________________________________________________

## 2018-09-15 ENCOUNTER — Other Ambulatory Visit (HOSPITAL_COMMUNITY)
Admission: RE | Admit: 2018-09-15 | Discharge: 2018-09-15 | Disposition: A | Discharge status: Home / Self Care | Payer: Medicare Other | Source: Ambulatory Visit | Attending: Urology | Admitting: Urology

## 2018-09-15 ENCOUNTER — Encounter (HOSPITAL_COMMUNITY)
Admission: RE | Admit: 2018-09-15 | Discharge: 2018-09-15 | Disposition: A | Discharge status: Home / Self Care | Payer: Medicare Other | Source: Ambulatory Visit | Attending: Urology | Admitting: Urology

## 2018-09-15 ENCOUNTER — Other Ambulatory Visit: Payer: Self-pay | Admitting: Urology

## 2018-09-15 ENCOUNTER — Ambulatory Visit (HOSPITAL_COMMUNITY): Payer: Medicare Other | Admitting: Psychiatry

## 2018-09-15 ENCOUNTER — Encounter (HOSPITAL_COMMUNITY): Payer: Self-pay

## 2018-09-15 ENCOUNTER — Other Ambulatory Visit: Payer: Self-pay

## 2018-09-15 DIAGNOSIS — E785 Hyperlipidemia, unspecified: Secondary | ICD-10-CM | POA: Diagnosis not present

## 2018-09-15 DIAGNOSIS — G4730 Sleep apnea, unspecified: Secondary | ICD-10-CM | POA: Diagnosis not present

## 2018-09-15 DIAGNOSIS — Z79890 Hormone replacement therapy (postmenopausal): Secondary | ICD-10-CM | POA: Diagnosis not present

## 2018-09-15 DIAGNOSIS — N201 Calculus of ureter: Secondary | ICD-10-CM | POA: Insufficient documentation

## 2018-09-15 DIAGNOSIS — Z1159 Encounter for screening for other viral diseases: Secondary | ICD-10-CM | POA: Diagnosis not present

## 2018-09-15 DIAGNOSIS — F17210 Nicotine dependence, cigarettes, uncomplicated: Secondary | ICD-10-CM | POA: Diagnosis not present

## 2018-09-15 DIAGNOSIS — F319 Bipolar disorder, unspecified: Secondary | ICD-10-CM | POA: Diagnosis not present

## 2018-09-15 DIAGNOSIS — Z79899 Other long term (current) drug therapy: Secondary | ICD-10-CM | POA: Diagnosis not present

## 2018-09-15 DIAGNOSIS — Z01812 Encounter for preprocedural laboratory examination: Secondary | ICD-10-CM | POA: Insufficient documentation

## 2018-09-15 DIAGNOSIS — J45909 Unspecified asthma, uncomplicated: Secondary | ICD-10-CM | POA: Diagnosis not present

## 2018-09-15 DIAGNOSIS — K219 Gastro-esophageal reflux disease without esophagitis: Secondary | ICD-10-CM | POA: Diagnosis not present

## 2018-09-15 DIAGNOSIS — E039 Hypothyroidism, unspecified: Secondary | ICD-10-CM | POA: Diagnosis not present

## 2018-09-15 HISTORY — DX: Other complications of anesthesia, initial encounter: T88.59XA

## 2018-09-15 LAB — CBC
HCT: 51.5 % — ABNORMAL HIGH (ref 36.0–46.0)
Hemoglobin: 16.8 g/dL — ABNORMAL HIGH (ref 12.0–15.0)
MCH: 35.4 pg — ABNORMAL HIGH (ref 26.0–34.0)
MCHC: 32.6 g/dL (ref 30.0–36.0)
MCV: 108.6 fL — ABNORMAL HIGH (ref 80.0–100.0)
Platelets: 218 10*3/uL (ref 150–400)
RBC: 4.74 MIL/uL (ref 3.87–5.11)
RDW: 15.2 % (ref 11.5–15.5)
WBC: 13.1 10*3/uL — ABNORMAL HIGH (ref 4.0–10.5)
nRBC: 0 % (ref 0.0–0.2)

## 2018-09-15 LAB — BASIC METABOLIC PANEL
Anion gap: 11 (ref 5–15)
BUN: 13 mg/dL (ref 6–20)
CO2: 27 mmol/L (ref 22–32)
Calcium: 8.6 mg/dL — ABNORMAL LOW (ref 8.9–10.3)
Chloride: 103 mmol/L (ref 98–111)
Creatinine, Ser: 1.07 mg/dL — ABNORMAL HIGH (ref 0.44–1.00)
GFR calc Af Amer: >60 mL/min (ref >60)
GFR calc non Af Amer: 59 mL/min — ABNORMAL LOW (ref >60)
Glucose, Bld: 109 mg/dL — ABNORMAL HIGH (ref 70–99)
Potassium: 4.2 mmol/L (ref 3.5–5.1)
Sodium: 141 mmol/L (ref 135–145)

## 2018-09-15 LAB — SURGICAL PCR SCREEN
MRSA, PCR: POSITIVE — AB
Staphylococcus aureus: POSITIVE — AB

## 2018-09-15 LAB — HCG, SERUM, QUALITATIVE: Preg, Serum: NEGATIVE

## 2018-09-16 LAB — NOVEL CORONAVIRUS, NAA (HOSP ORDER, SEND-OUT TO REF LAB; TAT 18-24 HRS): SARS-CoV-2, NAA: NOT DETECTED

## 2018-09-16 NOTE — Progress Notes (Signed)
Anesthesia Chart Review   Case:  [id number] Date/Time:  09/17/18 1115   Procedure:  CYSTOSCOPY/URETEROSCOPY/HOLMIUM LASER/STENT PLACEMENT (Left ) - ONLY NEEDS 45 MIN   Anesthesia type:  General   Pre-op diagnosis:  LEFT URETERAL STONE   Location:  [facility]   Surgeon:  Ceasar Mons, MD      DISCUSSION:53 yo current every days smoker (15 pack years) with h/o bipolar disorder, depression, asthma, GERD, sleep apnea (no CPAP), hypothyroid, HLD, left ureteral stone scheduled for above procedure 09/17/2018 with Dr. Harrell Gave Lovena Neighbours.    Pt reports with previous anesthesia she is "sometimes hard to put to sleep".  Recent admission 4/27-4/30/2020 due to obstructive pyelonephritis. Discharged on antibiotics, has completed.  S/p cystoscopy with stent placement 09/01/2018 with no anesthesia complications noted.   Negative COVID19 swab 09/15/2018. WBC 13.1 at PAT 09/17/2018, vitals normal.  Dr. Lovena Neighbours made aware.    Pt can proceed with planned procedure barring acute status change.   VS: BP (!) 148/78   Pulse 98   Temp 37 C (Oral)   Resp 18   Ht 5\' 6"  (1.676 m)   Wt 97.5 kg   LMP 12/13/2017 (Approximate)   SpO2 94%   BMI 34.70 kg/m   PROVIDERS: [name], DO   LABS: Labs reviewed: Acceptable for surgery. (all labs ordered are listed, but only abnormal results are displayed)  Labs Reviewed  SURGICAL PCR SCREEN - Abnormal; Notable for the following components:      Result Value   MRSA, PCR POSITIVE (*)    Staphylococcus aureus POSITIVE (*)    All other components within normal limits  BASIC METABOLIC PANEL - Abnormal; Notable for the following components:   Glucose, Bld 109 (*)    Creatinine, Ser 1.07 (*)    Calcium 8.6 (*)    GFR calc non Af Amer 59 (*)    All other components within normal limits  CBC - Abnormal; Notable for the following components:   WBC 13.1 (*)    Hemoglobin 16.8 (*)    HCT 51.5 (*)    MCV 108.6 (*)    MCH 35.4 (*)    All other components within normal limits  HCG, SERUM, QUALITATIVE     IMAGES:   EKG:   CV:  Past Medical History:  Diagnosis Date  . Asthma   . Bipolar disorder ([place])   . Colon polyp   . Complication of anesthesia    "sometimes hard to put to sleep"  . Depression    Bipolar  . Gastric ulcer    around 2013.  stress related  . GERD (gastroesophageal reflux disease)   . History of kidney stones   . Hyperlipidemia   . Hypothyroid   . Sleep apnea    no cpap used    Past Surgical History:  Procedure Laterality Date  . ABLATION ON ENDOMETRIOSIS    . COLONOSCOPY     Per patient, done around 2013 in [place], had polyp and overdue for follow up.  . COLONOSCOPY WITH PROPOFOL N/A 05/12/2018   Procedure: COLONOSCOPY WITH PROPOFOL;  Surgeon: Daneil Dolin, MD;  [facility];  Service: Endoscopy;  Laterality: N/A;  12:00pm  . CYSTOSCOPY W/ URETERAL STENT PLACEMENT Left 09/01/2018   Procedure: CYSTOSCOPY WITH RETROGRADE PYELOGRAM/URETERAL STENT PLACEMENT;  Surgeon: Ceasar Mons, MD;  Location: [facility];  Service: Urology;  Laterality: Left;  . OVARIAN CYST SURGERY Left   . POLYPECTOMY  05/12/2018  Procedure: POLYPECTOMY;  Surgeon: Daneil Dolin, MD;  [facility];  Service: Endoscopy;;  polyp at splenic flexure  . TEMPOROMANDIBULAR JOINT SURGERY     9 surgeries  . TUBAL LIGATION    . vaginal childbirth     1    MEDICATIONS: . albuterol (PROVENTIL HFA;VENTOLIN HFA) 108 (90 Base) MCG/ACT inhaler  . CALCIUM CITRATE-VITAMIN D3 PO  . Cariprazine HCl (VRAYLAR) 6 MG CAPS  . Cholecalciferol (VITAMIN D3) 5000 units TABS  . Dexlansoprazole (DEXILANT) 30 MG capsule  . famotidine (PEPCID) 20 MG tablet  . fluticasone (FLONASE) 50 MCG/ACT nasal spray  . fluticasone (FLOVENT HFA) 220 MCG/ACT inhaler  . lamoTRIgine (LAMICTAL) 100 MG tablet  . levothyroxine (SYNTHROID, LEVOTHROID) 50 MCG tablet  . loratadine (CLARITIN) 10 MG tablet  . magnesium  gluconate (MAGONATE) 500 MG tablet  . Multiple Vitamins-Minerals (MULTIVITAMIN WOMEN PO)  . oxybutynin (DITROPAN) 5 MG tablet  . phenazopyridine (PYRIDIUM) 200 MG tablet  . QUEtiapine (SEROQUEL) 300 MG tablet  . venlafaxine XR (EFFEXOR-XR) 150 MG 24 hr capsule   No current facility-administered medications for this encounter.      Maia Plan [facility] Pre-Surgical Testing [phone] 09/16/18 11:56 AM

## 2018-09-16 NOTE — Anesthesia Preprocedure Evaluation (Addendum)
Anesthesia Evaluation  Patient identified by MRN, date of birth, ID band Patient awake    Reviewed: Allergy & Precautions, H&P , NPO status , Patient's Chart, lab work & pertinent test results  Airway Mallampati: II   Neck ROM: full    Dental   Pulmonary asthma , sleep apnea , Current Smoker,    breath sounds clear to auscultation       Cardiovascular negative cardio ROS   Rhythm:regular Rate:Normal     Neuro/Psych PSYCHIATRIC DISORDERS Depression Bipolar Disorder    GI/Hepatic PUD, GERD  ,  Endo/Other  Hypothyroidism   Renal/GU      Musculoskeletal   Abdominal   Peds  Hematology   Anesthesia Other Findings   Reproductive/Obstetrics                            Anesthesia Physical Anesthesia Plan  ASA: II  Anesthesia Plan: General   Post-op Pain Management:    Induction: Intravenous  PONV Risk Score and Plan: 2 and Ondansetron, Dexamethasone, Midazolam and Treatment may vary due to age or medical condition  Airway Management Planned: LMA  Additional Equipment:   Intra-op Plan:   Post-operative Plan:   Informed Consent: I have reviewed the patients History and Physical, chart, labs and discussed the procedure including the risks, benefits and alternatives for the proposed anesthesia with the patient or authorized representative who has indicated his/her understanding and acceptance.       Plan Discussed with: Anesthesiologist, CRNA and Surgeon  Anesthesia Plan Comments: (See PAT note 09/15/18, Konrad Felix, PA-C)       Anesthesia Quick Evaluation

## 2018-09-17 ENCOUNTER — Encounter (HOSPITAL_COMMUNITY)
Admission: RE | Disposition: A | Discharge status: Home / Self Care | Payer: Self-pay | Source: Home / Self Care | Attending: Urology

## 2018-09-17 ENCOUNTER — Other Ambulatory Visit: Payer: Self-pay

## 2018-09-17 ENCOUNTER — Ambulatory Visit (HOSPITAL_COMMUNITY)
Admission: RE | Admit: 2018-09-17 | Discharge: 2018-09-17 | Disposition: A | Discharge status: Home / Self Care | Payer: Medicare Other | Attending: Urology | Admitting: Urology

## 2018-09-17 ENCOUNTER — Ambulatory Visit (HOSPITAL_COMMUNITY): Payer: Medicare Other | Admitting: Anesthesiology

## 2018-09-17 ENCOUNTER — Ambulatory Visit (HOSPITAL_COMMUNITY): Payer: Medicare Other

## 2018-09-17 ENCOUNTER — Encounter (HOSPITAL_COMMUNITY): Payer: Self-pay | Admitting: *Deleted

## 2018-09-17 ENCOUNTER — Ambulatory Visit (HOSPITAL_COMMUNITY): Payer: Medicare Other | Admitting: Physician Assistant

## 2018-09-17 DIAGNOSIS — Z79899 Other long term (current) drug therapy: Secondary | ICD-10-CM | POA: Diagnosis not present

## 2018-09-17 DIAGNOSIS — N201 Calculus of ureter: Secondary | ICD-10-CM | POA: Insufficient documentation

## 2018-09-17 DIAGNOSIS — G4730 Sleep apnea, unspecified: Secondary | ICD-10-CM | POA: Diagnosis not present

## 2018-09-17 DIAGNOSIS — Z79890 Hormone replacement therapy (postmenopausal): Secondary | ICD-10-CM | POA: Insufficient documentation

## 2018-09-17 DIAGNOSIS — K219 Gastro-esophageal reflux disease without esophagitis: Secondary | ICD-10-CM | POA: Insufficient documentation

## 2018-09-17 DIAGNOSIS — J45902 Unspecified asthma with status asthmaticus: Secondary | ICD-10-CM | POA: Diagnosis not present

## 2018-09-17 DIAGNOSIS — E785 Hyperlipidemia, unspecified: Secondary | ICD-10-CM | POA: Insufficient documentation

## 2018-09-17 DIAGNOSIS — J45909 Unspecified asthma, uncomplicated: Secondary | ICD-10-CM | POA: Insufficient documentation

## 2018-09-17 DIAGNOSIS — F319 Bipolar disorder, unspecified: Secondary | ICD-10-CM | POA: Diagnosis not present

## 2018-09-17 DIAGNOSIS — E039 Hypothyroidism, unspecified: Secondary | ICD-10-CM | POA: Diagnosis not present

## 2018-09-17 DIAGNOSIS — A4151 Sepsis due to Escherichia coli [E. coli]: Secondary | ICD-10-CM | POA: Diagnosis not present

## 2018-09-17 DIAGNOSIS — Z1159 Encounter for screening for other viral diseases: Secondary | ICD-10-CM | POA: Insufficient documentation

## 2018-09-17 DIAGNOSIS — F17210 Nicotine dependence, cigarettes, uncomplicated: Secondary | ICD-10-CM | POA: Insufficient documentation

## 2018-09-17 HISTORY — PX: CYSTOSCOPY/URETEROSCOPY/HOLMIUM LASER/STENT PLACEMENT: SHX6546

## 2018-09-17 SURGERY — CYSTOSCOPY/URETEROSCOPY/HOLMIUM LASER/STENT PLACEMENT
Anesthesia: General | Laterality: Left

## 2018-09-17 MED ORDER — MIDAZOLAM HCL 5 MG/5ML IJ SOLN
INTRAMUSCULAR | Status: DC | PRN
  Administered 2018-09-17: 2 mg via INTRAVENOUS

## 2018-09-17 MED ORDER — OXYCODONE HCL 5 MG/5ML PO SOLN: 5.0000 mg | Freq: Once | ORAL | Status: DC | PRN

## 2018-09-17 MED ORDER — OXYCODONE HCL 5 MG PO TABS: 5.0000 mg | ORAL_TABLET | Freq: Once | ORAL | Status: DC | PRN

## 2018-09-17 MED ORDER — CEPHALEXIN 500 MG PO CAPS: 500.0000 mg | ORAL_CAPSULE | Freq: Two times a day (BID) | ORAL | 0 refills | Status: AC

## 2018-09-17 MED ORDER — SODIUM CHLORIDE 0.9 % IR SOLN
Status: DC | PRN
  Administered 2018-09-17: 3000 mL

## 2018-09-17 MED ORDER — ONDANSETRON HCL 4 MG/2ML IJ SOLN: 4.0000 mg | Freq: Four times a day (QID) | INTRAMUSCULAR | Status: DC | PRN

## 2018-09-17 MED ORDER — VANCOMYCIN HCL 1000 MG IV SOLR
1000.0000 mg | INTRAVENOUS | Status: AC
  Administered 2018-09-17: 1000 mg via INTRAVENOUS
  Filled 2018-09-17: qty 1000

## 2018-09-17 MED ORDER — 0.9 % SODIUM CHLORIDE (POUR BTL) OPTIME
TOPICAL | Status: DC | PRN
  Administered 2018-09-17: 1000 mL

## 2018-09-17 MED ORDER — MIDAZOLAM HCL 2 MG/2ML IJ SOLN
INTRAMUSCULAR | Status: AC
  Filled 2018-09-17: qty 2

## 2018-09-17 MED ORDER — FENTANYL CITRATE (PF) 100 MCG/2ML IJ SOLN: 25.0000 ug | INTRAMUSCULAR | Status: DC | PRN

## 2018-09-17 MED ORDER — PROPOFOL 10 MG/ML IV BOLUS
INTRAVENOUS | Status: AC
  Filled 2018-09-17: qty 20

## 2018-09-17 MED ORDER — PROPOFOL 10 MG/ML IV BOLUS
INTRAVENOUS | Status: DC | PRN
  Administered 2018-09-17: 200 mg via INTRAVENOUS

## 2018-09-17 MED ORDER — DIPHENHYDRAMINE HCL 50 MG/ML IJ SOLN
INTRAMUSCULAR | Status: AC
  Filled 2018-09-17: qty 1

## 2018-09-17 MED ORDER — LACTATED RINGERS IV SOLN
INTRAVENOUS | Status: DC
  Administered 2018-09-17: 10:00:00 via INTRAVENOUS

## 2018-09-17 MED ORDER — HYDROCODONE-ACETAMINOPHEN 5-325 MG PO TABS: 1.0000 | ORAL_TABLET | ORAL | 0 refills | Status: DC | PRN

## 2018-09-17 MED ORDER — ONDANSETRON HCL 4 MG/2ML IJ SOLN
INTRAMUSCULAR | Status: DC | PRN
  Administered 2018-09-17: 4 mg via INTRAVENOUS

## 2018-09-17 MED ORDER — DIPHENHYDRAMINE HCL 50 MG/ML IJ SOLN
INTRAMUSCULAR | Status: DC | PRN
  Administered 2018-09-17: 25 mg via INTRAVENOUS

## 2018-09-17 MED ORDER — FENTANYL CITRATE (PF) 100 MCG/2ML IJ SOLN
INTRAMUSCULAR | Status: AC
  Filled 2018-09-17: qty 2

## 2018-09-17 MED ORDER — DEXAMETHASONE SODIUM PHOSPHATE 10 MG/ML IJ SOLN
INTRAMUSCULAR | Status: DC | PRN
  Administered 2018-09-17: 10 mg via INTRAVENOUS

## 2018-09-17 MED ORDER — CEFAZOLIN SODIUM-DEXTROSE 2-4 GM/100ML-% IV SOLN
2.0000 g | Freq: Once | INTRAVENOUS | Status: AC
  Administered 2018-09-17: 2 g via INTRAVENOUS
  Filled 2018-09-17: qty 100

## 2018-09-17 MED ORDER — FENTANYL CITRATE (PF) 100 MCG/2ML IJ SOLN
INTRAMUSCULAR | Status: DC | PRN
  Administered 2018-09-17 (×2): 50 ug via INTRAVENOUS

## 2018-09-17 MED ORDER — LIDOCAINE 2% (20 MG/ML) 5 ML SYRINGE
INTRAMUSCULAR | Status: DC | PRN
  Administered 2018-09-17: 60 mg via INTRAVENOUS

## 2018-09-17 SURGICAL SUPPLY — 21 items
BAG URO CATCHER STRL LF (MISCELLANEOUS) ×3 IMPLANT
BASKET ZERO TIP NITINOL 2.4FR (BASKET) IMPLANT
CATH URET 5FR 28IN OPEN ENDED (CATHETERS) ×3 IMPLANT
CLOTH BEACON ORANGE TIMEOUT ST (SAFETY) ×3 IMPLANT
COVER WAND RF STERILE (DRAPES) IMPLANT
EXTRACTOR STONE NITINOL NGAGE (UROLOGICAL SUPPLIES) ×3 IMPLANT
FIBER LASER FLEXIVA 365 (UROLOGICAL SUPPLIES) IMPLANT
FIBER LASER TRAC TIP (UROLOGICAL SUPPLIES) IMPLANT
GLOVE BIOGEL M STRL SZ7.5 (GLOVE) ×3 IMPLANT
GOWN STRL REUS W/TWL XL LVL3 (GOWN DISPOSABLE) ×3 IMPLANT
GUIDEWIRE STR DUAL SENSOR (WIRE) IMPLANT
GUIDEWIRE ZIPWRE .038 STRAIGHT (WIRE) ×3 IMPLANT
KIT TURNOVER KIT A (KITS) ×3 IMPLANT
MANIFOLD NEPTUNE II (INSTRUMENTS) ×3 IMPLANT
PACK CYSTO (CUSTOM PROCEDURE TRAY) ×3 IMPLANT
SHEATH URETERAL 12FRX35CM (MISCELLANEOUS) IMPLANT
STENT URET 6FRX24 CONTOUR (STENTS) ×3 IMPLANT
STENT URET 6FRX26 CONTOUR (STENTS) IMPLANT
TUBING CONNECTING 10 (TUBING) ×2 IMPLANT
TUBING CONNECTING 10' (TUBING) ×1
TUBING UROLOGY SET (TUBING) ×3 IMPLANT

## 2018-09-17 NOTE — Transfer of Care (Signed)
Immediate Anesthesia Transfer of Care Note  Patient: Rebecca Moreno  Procedure(s) Performed: CYSTOSCOPY/URETEROSCOPY/HOLMIUM LASER/STENT PLACEMENT (Left )  Patient Location: PACU  Anesthesia Type:General  Level of Consciousness: sedated, patient cooperative and responds to stimulation  Airway & Oxygen Therapy: Patient Spontanous Breathing and Patient connected to face mask oxygen  Post-op Assessment: Report given to RN and Post -op Vital signs reviewed and stable  Post vital signs: Reviewed and stable  Last Vitals:  Vitals Value Taken Time  BP 118/85 09/17/2018  1:46 PM  Temp 36.7 C 09/17/2018  1:45 PM  Pulse 82 09/17/2018  1:49 PM  Resp 9 09/17/2018  1:49 PM  SpO2 91 % 09/17/2018  1:49 PM  Vitals shown include unvalidated device data.  Last Pain:  Vitals:   09/17/18 1345  TempSrc:   PainSc: (P) 0-No pain      Patients Stated Pain Goal: 3 (06/06/41 8887)  Complications: No apparent anesthesia complications

## 2018-09-17 NOTE — H&P (Signed)
Urology Preoperative H&P   Chief Complaint: Left distal ureteral stone  History of Present Illness: Rebecca Moreno is a 53 y.o. female with a 4 mm left distal ureteral stone.  She is status post left JJ stent placement on 09/01/2018 secondary to urosepsis from her obstructing stone.  Urine culture grew pansensitive E. Coli that was treated w/ PO levaquin. She is here today for definitive stone treatment.  She denies interval fever/chills, nausea/vomiting or gross hematuria.  She does report intermittent episodes of left-sided flank pain, likely from her stent.  Past Medical History:  Diagnosis Date  . Asthma   . Bipolar disorder ([place])   . Colon polyp   . Complication of anesthesia    "sometimes hard to put to sleep"  . Depression    Bipolar  . Gastric ulcer    around 2013.  stress related  . GERD (gastroesophageal reflux disease)   . History of kidney stones   . Hyperlipidemia   . Hypothyroid   . Sleep apnea    no cpap used    Past Surgical History:  Procedure Laterality Date  . ABLATION ON ENDOMETRIOSIS    . COLONOSCOPY     Per patient, done around 2013 in [place], had polyp and overdue for follow up.  . COLONOSCOPY WITH PROPOFOL N/A 05/12/2018   Procedure: COLONOSCOPY WITH PROPOFOL;  Surgeon: Daneil Dolin, MD;  Location: [facility];  Service: Endoscopy;  Laterality: N/A;  12:00pm  . CYSTOSCOPY W/ URETERAL STENT PLACEMENT Left 09/01/2018   Procedure: CYSTOSCOPY WITH RETROGRADE PYELOGRAM/URETERAL STENT PLACEMENT;  Surgeon: Ceasar Mons, MD;  Location: [facility];  Service: Urology;  Laterality: Left;  . OVARIAN CYST SURGERY Left   . POLYPECTOMY  05/12/2018   Procedure: POLYPECTOMY;  Surgeon: Daneil Dolin, MD;  Location: [facility];  Service: Endoscopy;;  polyp at splenic flexure  . TEMPOROMANDIBULAR JOINT SURGERY     9 surgeries  . TUBAL LIGATION    . vaginal childbirth     1    Allergies:  Allergies  Allergen Reactions  . Lithium Other (See Comments)     Psoriasis     Family History  Problem Relation Age of Onset  . Anxiety disorder Mother   . Hypertension Mother   . Heart failure Mother   . Stroke Mother   . Hyperlipidemia Father   . Diabetes Father   . Stroke Father   . Hypertension Father   . Colon polyps Father        older than 37  . Diabetes Sister   . Hypertension Sister   . Uterine cancer Sister   . Ovarian cancer Maternal Grandmother   . Colon cancer Maternal Grandmother   . Renal Disease Paternal Grandmother   . Bipolar disorder Other     Social History:  reports that she has been smoking cigarettes. She has a 15.00 pack-year smoking history. She has never used smokeless tobacco. She reports previous drug use. She reports that she does not drink alcohol.  ROS: A complete review of systems was performed.  All systems are negative except for pertinent findings as noted.  Physical Exam:  Vital signs in last 24 hours: Temp:  [97.2 F (36.2 C)] 97.2 F (36.2 C) (05/13 0914) Pulse Rate:  [95] 95 (05/13 0914) Resp:  [16] 16 (05/13 0914) BP: (141)/(90) 141/90 (05/13 0914) SpO2:  [90 %] 90 % (05/13 0914) Weight:  [97.5 kg] 97.5 kg (05/13 0930) Constitutional:  Alert and oriented, No acute  distress Cardiovascular: Regular rate and rhythm, No JVD Respiratory: Normal respiratory effort, Lungs clear bilaterally GI: Abdomen is soft, nontender, nondistended, no abdominal masses GU: No CVA tenderness Lymphatic: No lymphadenopathy Neurologic: Grossly intact, no focal deficits Psychiatric: Normal mood and affect  Laboratory Data:  Recent Labs    09/15/18 1319  WBC 13.1*  HGB 16.8*  HCT 51.5*  PLT 218    Recent Labs    09/15/18 1319  NA 141  K 4.2  CL 103  GLUCOSE 109*  BUN 13  CALCIUM 8.6*  CREATININE 1.07*     No results found for this or any previous visit (from the past 24 hour(s)). Recent Results (from the past 240 hour(s))  Surgical pcr screen     Status: Abnormal   Collection Time: 09/15/18   1:19 PM  Result Value Ref Range Status   MRSA, PCR POSITIVE (A) NEGATIVE Final    Comment: RESULT CALLED TO, READ BACK BY AND VERIFIED WITH: SHOFFNER,S. RN @1520  ON 05.11.2020 BY COHEN,K    Staphylococcus aureus POSITIVE (A) NEGATIVE Final    Comment: (NOTE) The Xpert SA Assay (FDA approved for NASAL specimens in patients 31 years of age and older), is one component of a comprehensive surveillance program. It is not intended to diagnose infection nor to guide or monitor treatment. Performed at [facility], [place] [address], [place], [place] [address]   Novel Coronavirus, NAA (hospital order; send-out to ref lab)     Status: None   Collection Time: 09/15/18  1:50 PM  Result Value Ref Range Status   SARS-CoV-2, NAA NOT DETECTED NOT DETECTED Final    Comment: (NOTE) This test was developed and its performance characteristics determined by [employer]. This test has not been FDA cleared or approved. This test has been authorized by FDA under an Emergency Use Authorization (EUA). This test is only authorized for the duration of time the declaration that circumstances exist justifying the authorization of the emergency use of in vitro diagnostic tests for detection of SARS-CoV-2 virus and/or diagnosis of COVID-19 infection under section 564(b)(1) of the Act, 21 U.S.C. 660YTK-1(S)(0), unless the authorization is terminated or revoked sooner. When diagnostic testing is negative, the possibility of a false negative result should be considered in the context of a patient's recent exposures and the presence of clinical signs and symptoms consistent with COVID-19. An individual without symptoms of COVID-19 and who is not shedding SARS-CoV-2 virus would expect to have a negative (not detected) result in this assay. Performed  At: [facility] [address] [place], [place] [id number] Rush Farmer MD DU:[id number]    [place]   Final    Comment: Performed at [facility], [place] [address] [place],  [address]    Renal Function: Recent Labs    09/15/18 1319  CREATININE 1.07*   Estimated Creatinine Clearance: 71.6 mL/min (A) (by C-G formula based on SCr of 1.07 mg/dL (H)).  Radiologic Imaging: No results found.  I independently reviewed the above imaging studies.  Assessment and Plan MAHARI VANKIRK is a 53 y.o. female with a 4 mm distal left ureteral stone  The risks, benefits and alternatives of cystoscopy with LEFT ureteroscopy, laser lithotripsy and ureteral stent placement was discussed the patient.  Risks included, but are not limited to: bleeding, urinary tract infection, ureteral injury/avulsion, ureteral stricture formation, retained stone fragments, the possibility that multiple surgeries may be required to treat the stone(s), MI, stroke, PE and the inherent risks  of general anesthesia.  The patient voices understanding and wishes to proceed.     Ellison Hughs, MD 09/17/2018, 10:17 AM  [facility] Pager: [phone]

## 2018-09-17 NOTE — Discharge Instructions (Signed)
General Anesthesia, Adult  General anesthesia is the use of medicines to make a person "go to sleep" (unconscious) for a medical procedure. General anesthesia must be used for certain procedures, and is often recommended for procedures that:  Last a long time.  Require you to be still or in an unusual position.  Are major and can cause blood loss.  The medicines used for general anesthesia are called general anesthetics. As well as making you unconscious for a certain amount of time, these medicines:  Prevent pain.  Control your blood pressure.  Relax your muscles.  Tell a health care provider about:  Any allergies you have.  All medicines you are taking, including vitamins, herbs, eye drops, creams, and over-the-counter medicines.  Any problems you or family members have had with anesthetic medicines.  Types of anesthetics you have had in the past.  Any blood disorders you have.  Any surgeries you have had.  Any medical conditions you have.  Any recent upper respiratory, chest, or ear infections.  Any history of:  Heart or lung conditions, such as heart failure, sleep apnea, asthma, or chronic obstructive pulmonary disease (COPD).  Military service.  Depression or anxiety.  Any tobacco or drug use, including marijuana or alcohol use.  Whether you are pregnant or may be pregnant.  What are the risks?  Generally, this is a safe procedure. However, problems may occur, including:  Allergic reaction.  Lung and heart problems.  Inhaling food or liquid from the stomach into the lungs (aspiration).  Nerve injury.  Dental injury.  Air in the bloodstream, which can lead to stroke.  Extreme agitation or confusion (delirium) when you wake up from the anesthetic.  Waking up during your procedure and being unable to move. This is rare.  These problems are more likely to develop if you are having a major surgery or if you have an advanced or serious medical condition. You can prevent some of these complications by answering all  of your health care provider's questions thoroughly and by following all instructions before your procedure.  General anesthesia can cause side effects, including:  Nausea or vomiting.  A sore throat from the breathing tube.  Hoarseness.  Wheezing or coughing.  Shaking chills.  Tiredness.  Body aches.  Anxiety.  Sleepiness or drowsiness.  Confusion or agitation.  What happens before the procedure?  Staying hydrated  Follow instructions from your health care provider about hydration, which may include:  Up to 2 hours before the procedure - you may continue to drink clear liquids, such as water, clear fruit juice, black coffee, and plain tea.    Eating and drinking restrictions  Follow instructions from your health care provider about eating and drinking, which may include:  8 hours before the procedure - stop eating heavy meals or foods such as meat, fried foods, or fatty foods.  6 hours before the procedure - stop eating light meals or foods, such as toast or cereal.  6 hours before the procedure - stop drinking milk or drinks that contain milk.  2 hours before the procedure - stop drinking clear liquids.  Medicines  Ask your health care provider about:  Changing or stopping your regular medicines. This is especially important if you are taking diabetes medicines or blood thinners.  Taking medicines such as aspirin and ibuprofen. These medicines can thin your blood. Do not take these medicines unless your health care provider tells you to take them.  Taking over-the-counter medicines, vitamins, herbs, and   supplements. Do not take these during the week before your procedure unless your health care provider approves them.  General instructions  Starting 3-6 weeks before the procedure, do not use any products that contain nicotine or tobacco, such as cigarettes and e-cigarettes. If you need help quitting, ask your health care provider.  If you brush your teeth on the morning of the procedure, make sure to spit out all  of the toothpaste.  Tell your health care provider if you become ill or develop a cold, cough, or fever.  If instructed by your health care provider, bring your sleep apnea device with you on the day of your surgery (if applicable).  Ask your health care provider if you will be going home the same day, the following day, or after a longer hospital stay.  Plan to have someone take you home from the hospital or clinic.  Plan to have a responsible adult care for you for at least 24 hours after you leave the hospital or clinic. This is important.  What happens during the procedure?    You will be given anesthetics through both of the following:  A mask placed over your nose and mouth.  An IV in one of your veins.  You may receive a medicine to help you relax (sedative).  After you are unconscious, a breathing tube may be inserted down your throat to help you breathe. This will be removed before you wake up.  An anesthesia specialist will stay with you throughout your procedure. He or she will:  Keep you comfortable and safe by continuing to give you medicines and adjusting the amount of medicine that you get.  Monitor your blood pressure, pulse, and oxygen levels to make sure that the anesthetics do not cause any problems.  The procedure may vary among health care providers and hospitals.  What happens after the procedure?  Your blood pressure, temperature, heart rate, breathing rate, and blood oxygen level will be monitored until the medicines you were given have worn off.  You will wake up in a recovery area. You may wake up slowly.  If you feel anxious or agitated, you may be given medicine to help you calm down.  If you will be going home the same day, your health care provider may check to make sure you can walk, drink, and urinate.  Your health care provider will treat any pain or side effects you have before you go home.  Do not drive for 24 hours if you were given a sedative.  Summary  General anesthesia is used  to keep you still and prevent pain during a procedure.  It is important to tell your health care provider about your medical history and any surgeries you have had, and previous experience with anesthesia.  Follow your health care provider's instructions about when to stop eating, drinking, or taking certain medicines before your procedure.  Plan to have someone take you home from the hospital or clinic.  This information is not intended to replace advice given to you by your health care provider. Make sure you discuss any questions you have with your health care provider.  Document Released: 07/31/2007 Document Revised: 09/10/2017 Document Reviewed: 12/07/2016  Elsevier Interactive Patient Education  2019 [employer]

## 2018-09-17 NOTE — Op Note (Signed)
Operative Note  Preoperative diagnosis:  1.  4 mm left mid ureteral calculus  Postoperative diagnosis: Same  Procedure(s): 1.  Cystoscopy with left ureteroscopy, basket stone extraction and left JJ stent exchange 2.  Left JJ stent removal  Surgeon: Ellison Hughs, MD  Assistants:  None  Anesthesia:  General  Complications:  None  EBL: Less than 5 mL  Specimens: 1.  Left ureteral stone 2.  Previously placed left JJ stent was removed intact, inspected and discarded  Drains/Catheters: 1.  Left 6 French by 24 cm JJ stent without tether  Intraoperative findings:   1. Obstructing 4 mm left mid ureteral calculus  Indication:  Rebecca Moreno is a 53 y.o. female who was recently seen in consultation on 09/01/2018 for urosepsis secondary to a 4 mm left mid ureteral calculus.  She underwent stent placement at that time.  After a prolonged course of antibiotics, the patient is here today for definitive stone treatment.  She has been consented for the above procedures, voices understanding and wishes to proceed.  Description of procedure:  After informed consent was obtained, the patient was brought to the operating room and general LMA anesthesia was administered. The patient was then placed in the dorsolithotomy position and prepped and draped in the usual sterile fashion. A timeout was performed. A 23 French rigid cystoscope was then inserted into the urethral meatus and advanced into the bladder under direct vision. A complete bladder survey revealed no intravesical pathology.  Her previously placed left JJ stent was grasped at its distal curl and retracted to the urethral meatus.  A Glidewire was then advanced through the lumen of the stent and advanced up to the left renal pelvis, or fluoroscopic guidance.  The previously placed stent was removed over the wire intact, inspected and discarded.  A semirigid ureteroscope was then advanced alongside the safety wire and into the distal  aspects of the left ureter, immediately identifying her obstructing stone.  A tipless basket was then used to extract the stone atraumatically.  Inspection of the entire length of the left ureter revealed no residual stone or trauma.    The ureteroscope was then removed, leaving the wire in place.  A new 6 [place] by 24 cm JJ stent was then placed over the wire and into good position within the left collecting system, confirming placement via fluoroscopy.  The patient's bladder was drained.  She tolerated the procedure well and was transferred to the postanesthesia in stable condition.  Plan: Follow-up in 1 week for office cystoscopy and stent removal

## 2018-09-17 NOTE — Anesthesia Procedure Notes (Signed)
Procedure Name: LMA Insertion °Performed by: Cariah Salatino J, CRNA °Pre-anesthesia Checklist: Patient identified, Emergency Drugs available, Suction available, Patient being monitored and Timeout performed °Patient Re-evaluated:Patient Re-evaluated prior to induction °Oxygen Delivery Method: Circle system utilized °Preoxygenation: Pre-oxygenation with 100% oxygen °Induction Type: IV induction °LMA: LMA inserted °LMA Size: 4.0 °Number of attempts: 1 °Placement Confirmation: positive ETCO2 and breath sounds checked- equal and bilateral °Tube secured with: Tape °Dental Injury: Teeth and Oropharynx as per pre-operative assessment  ° ° ° ° ° ° °

## 2018-09-18 ENCOUNTER — Encounter (HOSPITAL_COMMUNITY): Payer: Self-pay | Admitting: Urology

## 2018-09-18 NOTE — Anesthesia Postprocedure Evaluation (Signed)
Anesthesia Post Note  Patient: Rebecca Moreno  Procedure(s) Performed: CYSTOSCOPY/URETEROSCOPY/HOLMIUM LASER/STENT PLACEMENT (Left )     Patient location during evaluation: PACU Anesthesia Type: General Level of consciousness: awake and alert Pain management: pain level controlled Vital Signs Assessment: post-procedure vital signs reviewed and stable Respiratory status: spontaneous breathing, nonlabored ventilation, respiratory function stable and patient connected to nasal cannula oxygen Cardiovascular status: blood pressure returned to baseline and stable Postop Assessment: no apparent nausea or vomiting Anesthetic complications: no    Last Vitals:  Vitals:   09/17/18 1400 09/17/18 1420  BP: 113/75 139/81  Pulse: 86 84  Resp: 14 16  Temp:  36.6 C  SpO2: 92% 92%    Last Pain:  Vitals:   09/17/18 1420  TempSrc:   PainSc: 0-No pain                 Tayon Parekh S

## 2018-09-24 DIAGNOSIS — N201 Calculus of ureter: Secondary | ICD-10-CM | POA: Diagnosis not present

## 2018-09-26 ENCOUNTER — Other Ambulatory Visit: Payer: Self-pay | Admitting: Family Medicine

## 2018-09-26 DIAGNOSIS — N201 Calculus of ureter: Secondary | ICD-10-CM | POA: Diagnosis not present

## 2018-09-26 NOTE — Telephone Encounter (Signed)
What is the name of the medication? Vraylar  Have you contacted your pharmacy to request a refill? yes  Which pharmacy would you like this sent to? [facility]   Patient notified that their request is being sent to the clinical staff for review and that they should receive a call once it is complete. If they do not receive a call within 24 hours they can check with their pharmacy or our office.

## 2018-09-26 NOTE — Telephone Encounter (Signed)
Patient aware that she will have to request from [place].

## 2018-09-30 ENCOUNTER — Telehealth (HOSPITAL_COMMUNITY): Payer: Self-pay | Admitting: Psychiatry

## 2018-09-30 MED ORDER — LAMOTRIGINE 100 MG PO TABS: 300.0000 mg | ORAL_TABLET | Freq: Every day | ORAL | 0 refills | Status: DC

## 2018-09-30 NOTE — Telephone Encounter (Signed)
Ordered refill for lamotrigine per request. Please contact the patient to make follow up appointment, and notify of no show policy. Will not plan to prescribe any refill without evaluation.

## 2018-09-30 NOTE — Telephone Encounter (Signed)
LVM PER PROVIDER DR HISADA: Ordered refill for lamotrigine per request. Please contact the patient to make follow up appointment, and notify of no show policy. Will not plan to prescribe any refill without evaluation

## 2018-10-02 ENCOUNTER — Encounter (HOSPITAL_COMMUNITY): Payer: Self-pay | Admitting: Psychiatry

## 2018-10-02 ENCOUNTER — Ambulatory Visit (INDEPENDENT_AMBULATORY_CARE_PROVIDER_SITE_OTHER): Payer: Medicare Other | Admitting: Psychiatry

## 2018-10-02 ENCOUNTER — Other Ambulatory Visit: Payer: Self-pay

## 2018-10-02 DIAGNOSIS — F39 Unspecified mood [affective] disorder: Secondary | ICD-10-CM | POA: Diagnosis not present

## 2018-10-02 MED ORDER — CARIPRAZINE HCL 6 MG PO CAPS: 1.0000 | ORAL_CAPSULE | Freq: Every day | ORAL | 2 refills | Status: DC

## 2018-10-02 MED ORDER — VENLAFAXINE HCL ER 150 MG PO CP24: 300.0000 mg | ORAL_CAPSULE | Freq: Every day | ORAL | 2 refills | Status: DC

## 2018-10-02 MED ORDER — LAMOTRIGINE 100 MG PO TABS: 300.0000 mg | ORAL_TABLET | Freq: Every day | ORAL | 2 refills | Status: DC

## 2018-10-02 MED ORDER — QUETIAPINE FUMARATE 300 MG PO TABS: 300.0000 mg | ORAL_TABLET | Freq: Every day | ORAL | 2 refills | Status: DC

## 2018-10-02 NOTE — Progress Notes (Signed)
Virtual Visit via Telephone Note  I connected with Rebecca Moreno on 10/02/18 at  3:30 PM EDT by telephone and verified that I am speaking with the correct person using two identifiers.   I discussed the limitations, risks, security and privacy concerns of performing an evaluation and management service by telephone and the availability of in person appointments. I also discussed with the patient that there may be a patient responsible charge related to this service. The patient expressed understanding and agreed to proceed.   I discussed the assessment and treatment plan with the patient. The patient was provided an opportunity to ask questions and all were answered. The patient agreed with the plan and demonstrated an understanding of the instructions.   The patient was advised to call back or seek an in-person evaluation if the symptoms worsen or if the condition fails to improve as anticipated.  I provided 15 minutes of non-face-to-face time during this encounter.   Norman Clay, MD   [facility] MD/PA/NP OP Progress Note  10/02/2018 3:53 PM Rebecca Moreno  MRN:  [id number]  Chief Complaint:  Chief Complaint    Depression; Follow-up; Other     HPI:  This is a follow-up appointment for mood disorder.  She states that she has been doing very well.  She has good relationship with her daughter.  She does exercise every day, and also she visits her sister. She finds this exercise to be very helpful for mental hygiene. She tends to feel anxious when she interacts with her mother-in-law with dementia.  Her mother is doing well.  She tries to excuse herself when she is too anxious.  She has better relationship with her husband, although she does not elaborate it.  Although she still has insomnia, it has been improving.  She has more energy and motivation.  She has fair concentration.  She has good appetite.  She denies SI.  She denies panic attacks.  She denies decreased need for sleep or euphoria.      219 lbs according to the patient  Wt Readings from Last 3 Encounters:  09/17/18 215 lb (97.5 kg)  09/15/18 215 lb (97.5 kg)  09/02/18 227 lb 1.2 oz (103 kg)    Visit Diagnosis:    ICD-10-CM   1. Mood disorder ([place]) F39     Past Psychiatric History: Please see initial evaluation for full details. I have reviewed the history. No updates at this time.     Past Medical History:  Past Medical History:  Diagnosis Date  . Asthma   . Bipolar disorder ([place])   . Colon polyp   . Complication of anesthesia    "sometimes hard to put to sleep"  . Depression    Bipolar  . Gastric ulcer    around 2013.  stress related  . GERD (gastroesophageal reflux disease)   . History of kidney stones   . Hyperlipidemia   . Hypothyroid   . Sleep apnea    no cpap used    Past Surgical History:  Procedure Laterality Date  . ABLATION ON ENDOMETRIOSIS    . COLONOSCOPY     Per patient, done around 2013 in [place], had polyp and overdue for follow up.  . COLONOSCOPY WITH PROPOFOL N/A 05/12/2018   Procedure: COLONOSCOPY WITH PROPOFOL;  Surgeon: Daneil Dolin, MD;  Location: [facility];  Service: Endoscopy;  Laterality: N/A;  12:00pm  . CYSTOSCOPY W/ URETERAL STENT PLACEMENT Left 09/01/2018   Procedure: CYSTOSCOPY WITH RETROGRADE PYELOGRAM/URETERAL  STENT PLACEMENT;  Surgeon: Ceasar Mons, MD;  Location: [facility];  Service: Urology;  Laterality: Left;  . CYSTOSCOPY/URETEROSCOPY/HOLMIUM LASER/STENT PLACEMENT Left 09/17/2018   Procedure: CYSTOSCOPY/URETEROSCOPY/HOLMIUM LASER/STENT PLACEMENT;  Surgeon: Ceasar Mons, MD;  Location: [facility];  Service: Urology;  Laterality: Left;  ONLY NEEDS 45 MIN  . OVARIAN CYST SURGERY Left   . POLYPECTOMY  05/12/2018   Procedure: POLYPECTOMY;  Surgeon: Daneil Dolin, MD;  Location: [facility];  Service: Endoscopy;;  polyp at splenic flexure  . TEMPOROMANDIBULAR JOINT SURGERY     9 surgeries  . TUBAL LIGATION    . vaginal childbirth      1    Family Psychiatric History: Please see initial evaluation for full details. I have reviewed the history. No updates at this time.     Family History:  Family History  Problem Relation Age of Onset  . Anxiety disorder Mother   . Hypertension Mother   . Heart failure Mother   . Stroke Mother   . Hyperlipidemia Father   . Diabetes Father   . Stroke Father   . Hypertension Father   . Colon polyps Father        older than 77  . Diabetes Sister   . Hypertension Sister   . Uterine cancer Sister   . Ovarian cancer Maternal Grandmother   . Colon cancer Maternal Grandmother   . Renal Disease Paternal Grandmother   . Bipolar disorder Other     Social History:  Social History   Socioeconomic History  . Marital status: Married    Spouse name: Not on file  . Number of children: 1  . Years of education: Not on file  . Highest education level: High school graduate  Occupational History  . Occupation: disability  Social Needs  . Financial resource strain: Not hard at all  . Food insecurity:    Worry: Never true    Inability: Never true  . Transportation needs:    Medical: No    Non-medical: No  Tobacco Use  . Smoking status: Current Every Day Smoker    Packs/day: 0.50    Years: 30.00    Pack years: 15.00    Types: Cigarettes  . Smokeless tobacco: Never Used  Substance and Sexual Activity  . Alcohol use: Never    Frequency: Never  . Drug use: Not Currently  . Sexual activity: Not Currently    Birth control/protection: Surgical    Comment: tubal  Lifestyle  . Physical activity:    Days per week: 0 days    Minutes per session: 0 min  . Stress: To some extent  Relationships  . Social connections:    Talks on phone: More than three times a week    Gets together: More than three times a week    Attends religious service: Never    Active member of club or organization: No    Attends meetings of clubs or organizations: Never    Relationship status: Married   Other Topics Concern  . Not on file  Social History Narrative  . Not on file    Allergies:  Allergies  Allergen Reactions  . Lithium Other (See Comments)    Psoriasis     Metabolic Disorder Labs: No results found for: HGBA1C, MPG No results found for: PROLACTIN Lab Results  Component Value Date   CHOL 268 (H) 01/15/2018   TRIG 218 (H) 01/15/2018   HDL 56 01/15/2018   CHOLHDL 4.8 (H) 01/15/2018  LDLCALC 168 (H) 01/15/2018   Lab Results  Component Value Date   TSH 1.550 04/16/2018   TSH 2.470 01/15/2018    Therapeutic Level Labs: No results found for: LITHIUM No results found for: VALPROATE No components found for:  CBMZ  Current Medications: Current Outpatient Medications  Medication Sig Dispense Refill  . albuterol (PROVENTIL HFA;VENTOLIN HFA) 108 (90 Base) MCG/ACT inhaler Inhale 1 puff into the lungs every 6 (six) hours as needed for wheezing or shortness of breath.    Marland Kitchen CALCIUM CITRATE-VITAMIN D3 PO Take 1 tablet by mouth daily.    . Cariprazine HCl (VRAYLAR) 6 MG CAPS Take 1 capsule (6 mg total) by mouth daily. 30 capsule 2  . Cholecalciferol (VITAMIN D3) 5000 units TABS Take 5,000 Units by mouth daily.     Marland Kitchen Dexlansoprazole (DEXILANT) 30 MG capsule Take 30 mg by mouth daily.    . famotidine (PEPCID) 20 MG tablet Take 20 mg by mouth daily.    . fluticasone (FLONASE) 50 MCG/ACT nasal spray Place 2 sprays into both nostrils daily. 16 g 6  . fluticasone (FLOVENT HFA) 220 MCG/ACT inhaler Inhale 1 puff into the lungs daily.     Marland Kitchen HYDROcodone-acetaminophen (NORCO) 5-325 MG tablet Take 1 tablet by mouth every 4 (four) hours as needed for moderate pain. 20 tablet 0  . lamoTRIgine (LAMICTAL) 100 MG tablet Take 3 tablets (300 mg total) by mouth at bedtime. 90 tablet 2  . levothyroxine (SYNTHROID, LEVOTHROID) 50 MCG tablet Take 50 mcg by mouth daily before breakfast.    . loratadine (CLARITIN) 10 MG tablet Take 1 tablet (10 mg total) by mouth daily. 30 tablet 11  .  magnesium gluconate (MAGONATE) 500 MG tablet Take 500 mg by mouth daily.    . Multiple Vitamins-Minerals (MULTIVITAMIN WOMEN PO) Take 1 tablet by mouth daily.    Marland Kitchen oxybutynin (DITROPAN) 5 MG tablet Take 1 tablet (5 mg total) by mouth every 8 (eight) hours as needed for bladder spasms. 30 tablet 1  . phenazopyridine (PYRIDIUM) 200 MG tablet Take 1 tablet (200 mg total) by mouth 3 (three) times daily as needed (for pain with urination). 30 tablet 0  . QUEtiapine (SEROQUEL) 300 MG tablet Take 1 tablet (300 mg total) by mouth at bedtime. 90 tablet 2  . venlafaxine XR (EFFEXOR-XR) 150 MG 24 hr capsule Take 2 capsules (300 mg total) by mouth daily. 60 capsule 2   No current facility-administered medications for this visit.      Musculoskeletal: Strength & Muscle Tone: N/A Gait & Station: N/A Patient leans: N/A  Psychiatric Specialty Exam: Review of Systems  Psychiatric/Behavioral: Negative for depression, hallucinations, memory loss, substance abuse and suicidal ideas. The patient is nervous/anxious and has insomnia.   All other systems reviewed and are negative.   There were no vitals taken for this visit.There is no height or weight on file to calculate BMI.  General Appearance: NA  Eye Contact:  NA  Speech:  Clear and Coherent  Volume:  Normal  Mood:  "better"  Affect:  NA  Thought Process:  Coherent  Orientation:  Full (Time, Place, and Person)  Thought Content: Logical   Suicidal Thoughts:  No  Homicidal Thoughts:  No  Memory:  Immediate;   Good  Judgement:  Good  Insight:  Fair  Psychomotor Activity:  Normal  Concentration:  Concentration: Good and Attention Span: Good  Recall:  Good  Fund of Knowledge: Good  Language: Good  Akathisia:  No  Handed:  Right  AIMS (if indicated): not done  Assets:  Communication Skills Desire for Improvement  ADL's:  Intact  Cognition: WNL  Sleep:  Good   Screenings: GAD-7     Office Visit from 04/16/2018 in [place]  Total GAD-7 Score  3    Mini-Mental     Clinical Support from 07/22/2018 in [place]  Total Score (max 30 points )  30    PHQ2-9     Patient Outreach Telephone from 09/10/2018 in [place] Visit from 07/22/2018 in [place] Visit from 06/20/2018 in [place] Office Visit from 05/19/2018 in [place] [facility] Visit from 04/16/2018 in [place]  PHQ-2 Total Score  1  2  2  2  2   PHQ-9 Total Score  -  4  7  6  7        Assessment and Plan:  Rebecca Moreno is a 53 y.o. year old female with a history of bipolar disorder by history, hypothyroidism , who presents for follow up appointment for Mood disorder [facility])  # Unspecified mood disorder # Bipolar I disorder by history # r/o MDD with psychotic features Patient reports significant improvement in mood symptoms since her last visit.  Psychosocial stressors includes marital conflict, and she has very low self-esteem.  Continue venlafaxine to target depression.  Will continue lamotrigine to target mood dysregulation.  Discussed risk of Stevens-Johnson syndrome.  Will continue Vraylar and quetiapine to target mood dysregulation.  Although it is preferable to do monotherapy, she reportedly had a history of relapse in her symptoms which she tried.  Discussed potential metabolic side effect and EPS.  Will continue clonazepam for anxiety.  Discussed risk of dependence and oversedation.  Noted that although the patient was diagnosed with bipolar disorder in the past, patient and her husband both denies any manic/hypomanic episode except mild irritability, paranoia in the context of depression.  Will continue to monitor.   Plan I have reviewed and updated plans as below 1. Continue Venlafaxine 300 mg daily  2. Continue Lamotrigine 300 mg daily  3. Continue Vraylar 6 mg  4. Continue Quetiapine 300 mg  at night  5. Continue Clonazepam 1 mg twice a dayas needed for anxiety 6. Next appointment: 8/24 at 1:40 for 20 mins, phone 7.Return to clinic in three months for 15 mins 8. Obtain record from your previous psychiatrist Discussed no show policy. She wants to hold therapy session as she is doing better.   Past trials of medication:sertraline, fluoxetine, lexapro,Celexa, duloxetine,Wellbutrinlithium (psoriasis), Depakote(did not work), olanzapine, Abilify(weight gain), latuda, Geodon  The patient demonstrates the following risk factors for suicide: Chronic risk factors for suicide include:psychiatric disorder ofdepression. Acute risk factorsfor suicide include: family or marital conflict and unemployment. Protective factorsfor this patient include: positive social support and hope for the future. Considering these factors, the overall suicide risk at this point appears to below. Patientisappropriate for outpatient follow up.  Norman Clay, MD 10/02/2018, 3:53 PM

## 2018-10-02 NOTE — Patient Instructions (Signed)
1. Continue Venlafaxine 300 mg daily  2. Continue Lamotrigine 300 mg daily  3. Continue Vraylar 6 mg  4. Continue Quetiapine 300 mg at night  5. Continue Clonazepam 1 mg twice a dayas needed for anxiety 6. Next appointment: 8/24 at 1:40

## 2018-10-07 ENCOUNTER — Telehealth: Payer: Self-pay | Admitting: Family Medicine

## 2018-10-09 ENCOUNTER — Telehealth: Payer: Self-pay | Admitting: Family Medicine

## 2018-10-13 ENCOUNTER — Other Ambulatory Visit: Payer: Self-pay

## 2018-10-13 ENCOUNTER — Ambulatory Visit (INDEPENDENT_AMBULATORY_CARE_PROVIDER_SITE_OTHER): Payer: Medicare Other | Admitting: Family Medicine

## 2018-10-13 ENCOUNTER — Encounter: Payer: Self-pay | Admitting: Family Medicine

## 2018-10-13 DIAGNOSIS — E039 Hypothyroidism, unspecified: Secondary | ICD-10-CM | POA: Diagnosis not present

## 2018-10-13 DIAGNOSIS — R51 Headache: Secondary | ICD-10-CM

## 2018-10-13 DIAGNOSIS — D72829 Elevated white blood cell count, unspecified: Secondary | ICD-10-CM

## 2018-10-13 DIAGNOSIS — R519 Headache, unspecified: Secondary | ICD-10-CM

## 2018-10-13 MED ORDER — PHENTERMINE HCL 37.5 MG PO TABS: 18.2500 mg | ORAL_TABLET | Freq: Every day | ORAL | 1 refills | Status: DC

## 2018-10-13 NOTE — Progress Notes (Signed)
Telephone visit  Subjective: CC: f/u hospital discharge for urosepsis PCP: [name], DO WUJ:Rebecca Moreno is a 53 y.o. female calls for telephone consult today. Patient provides verbal consent for consult held via phone.  Location of patient: Home Location of provider: [facility] Others present for call: Daughter  1.  Renal stone with acute infection Patient recently admitted and discharged for acute pyelonephritis.  She had surgical intervention for a renal stone.  Since discharge, she notes that she is feeling quite a bit better.  Denies any new back pain, nausea, vomiting, fevers, hematuria or dysuria.  2.  Daily headache Patient reports she has ongoing daily to every other day headaches despite use of oral antihistamine and nasal spray.  She does need a refill on the nasal spray.  She denies any photophobia, phonophobia, nausea, vomiting, dizziness, speech issues or balance problems.  No rhinorrhea, nasal congestion.  She does have an occasional cough that seems to be worse at nighttime.  She reports compliance with her PPI and H2 blocker.  3.  Obesity Patient continues to struggle with obesity, despite significant modifications in her diet and exercise regimen.  She exercises regularly and maintains a low sugar diet.  She is totally gotten rid of sodas.  She predominately drinks water and occasionally green tea.  Her portion sizes are not more than recommended.  Her daughter voices concern because she is continued to have difficulty losing weight.  She reports her weight today is 221 pounds.  She reports some lower extremity edema.  She has not had a thyroid checked in several months but reports compliance with her thyroid replacement.   ROS: Per HPI  Allergies  Allergen Reactions  . Lithium Other (See Comments)    Psoriasis    Past Medical History:  Diagnosis Date  . Asthma   . Bipolar disorder ([place])   . Colon polyp   . Complication of anesthesia    "sometimes hard to  put to sleep"  . Depression    Bipolar  . Gastric ulcer    around 2013.  stress related  . GERD (gastroesophageal reflux disease)   . History of kidney stones   . Hyperlipidemia   . Hypothyroid   . Sleep apnea    no cpap used    Current Outpatient Medications:  .  albuterol (PROVENTIL HFA;VENTOLIN HFA) 108 (90 Base) MCG/ACT inhaler, Inhale 1 puff into the lungs every 6 (six) hours as needed for wheezing or shortness of breath., Disp: , Rfl:  .  CALCIUM CITRATE-VITAMIN D3 PO, Take 1 tablet by mouth daily., Disp: , Rfl:  .  Cariprazine HCl (VRAYLAR) 6 MG CAPS, Take 1 capsule (6 mg total) by mouth daily., Disp: 30 capsule, Rfl: 2 .  Cholecalciferol (VITAMIN D3) 5000 units TABS, Take 5,000 Units by mouth daily. , Disp: , Rfl:  .  Dexlansoprazole (DEXILANT) 30 MG capsule, Take 30 mg by mouth daily., Disp: , Rfl:  .  famotidine (PEPCID) 20 MG tablet, Take 20 mg by mouth daily., Disp: , Rfl:  .  fluticasone (FLONASE) 50 MCG/ACT nasal spray, Place 2 sprays into both nostrils daily., Disp: 16 g, Rfl: 6 .  fluticasone (FLOVENT HFA) 220 MCG/ACT inhaler, Inhale 1 puff into the lungs daily. , Disp: , Rfl:  .  HYDROcodone-acetaminophen (NORCO) 5-325 MG tablet, Take 1 tablet by mouth every 4 (four) hours as needed for moderate pain., Disp: 20 tablet, Rfl: 0 .  lamoTRIgine (LAMICTAL) 100 MG tablet, Take 3 tablets (300 mg  total) by mouth at bedtime., Disp: 90 tablet, Rfl: 2 .  levothyroxine (SYNTHROID, LEVOTHROID) 50 MCG tablet, Take 50 mcg by mouth daily before breakfast., Disp: , Rfl:  .  loratadine (CLARITIN) 10 MG tablet, Take 1 tablet (10 mg total) by mouth daily., Disp: 30 tablet, Rfl: 11 .  magnesium gluconate (MAGONATE) 500 MG tablet, Take 500 mg by mouth daily., Disp: , Rfl:  .  Multiple Vitamins-Minerals (MULTIVITAMIN WOMEN PO), Take 1 tablet by mouth daily., Disp: , Rfl:  .  oxybutynin (DITROPAN) 5 MG tablet, Take 1 tablet (5 mg total) by mouth every 8 (eight) hours as needed for bladder  spasms., Disp: 30 tablet, Rfl: 1 .  phenazopyridine (PYRIDIUM) 200 MG tablet, Take 1 tablet (200 mg total) by mouth 3 (three) times daily as needed (for pain with urination)., Disp: 30 tablet, Rfl: 0 .  QUEtiapine (SEROQUEL) 300 MG tablet, Take 1 tablet (300 mg total) by mouth at bedtime., Disp: 90 tablet, Rfl: 2 .  venlafaxine XR (EFFEXOR-XR) 150 MG 24 hr capsule, Take 2 capsules (300 mg total) by mouth daily., Disp: 60 capsule, Rfl: 2  Assessment/ Plan: 53 y.o. female   1. Acquired hypothyroidism Check thyroid panel may need to adjust medication given ongoing difficulty with weight loss - Thyroid Panel With TSH; Future  2. Daily headache No abnormal neurologic signs or red flag symptoms or signs.  Given ongoing daily headache that seems refractory to OTC medication and allergy medications I placed referral to headache clinic for further evaluation. - AMB referral to headache clinic  3. Morbid obesity ([place]) Possibly related to ongoing need for atypical antipsychotics versus thyroid disorder.  I have renewed the Adipex and we will continue this for the next 2 months to kick start weight loss again.  Of note, the last time she used that she was at 227 pounds that she has had some success with weight loss.  I have placed a referral to bariatric surgery for consideration/discussion of bariatric surgery.  Patient is interested in this.  She will come in for fasting labs. - phentermine (ADIPEX-P) 37.5 MG tablet; Take 0.5-1 tablets (18.75-37.5 mg total) by mouth daily before breakfast.  Dispense: 30 tablet; Refill: 1 - Amb Referral to Bariatric Surgery - Thyroid Panel With TSH; Future - CMP14+EGFR; Future - Lipid Panel; Future  4. Leukocytosis, unspecified type Likely related to pyelonephritis that she is being treated for we will repeat CBC to ensure resolution. - CBC; Future   Start time: attempted to reach at 1:01pm, 1:16pm, 1:18pm. End time: 1:33pm  Total time spent on patient care  (including telephone call/ virtual visit): 21 minutes  [place], [place] [phone]

## 2018-11-03 DIAGNOSIS — R8279 Other abnormal findings on microbiological examination of urine: Secondary | ICD-10-CM | POA: Diagnosis not present

## 2018-11-03 DIAGNOSIS — N201 Calculus of ureter: Secondary | ICD-10-CM | POA: Diagnosis not present

## 2018-11-12 DIAGNOSIS — G43719 Chronic migraine without aura, intractable, without status migrainosus: Secondary | ICD-10-CM | POA: Diagnosis not present

## 2018-11-12 DIAGNOSIS — Z049 Encounter for examination and observation for unspecified reason: Secondary | ICD-10-CM | POA: Diagnosis not present

## 2018-11-14 DIAGNOSIS — H5213 Myopia, bilateral: Secondary | ICD-10-CM | POA: Diagnosis not present

## 2018-12-11 ENCOUNTER — Other Ambulatory Visit (HOSPITAL_COMMUNITY): Payer: Self-pay | Admitting: Psychiatry

## 2018-12-11 MED ORDER — CLONAZEPAM 1 MG PO TABS: 1.0000 mg | ORAL_TABLET | Freq: Two times a day (BID) | ORAL | 0 refills | Status: DC | PRN

## 2018-12-18 ENCOUNTER — Other Ambulatory Visit: Payer: Medicare Other

## 2018-12-18 ENCOUNTER — Other Ambulatory Visit: Payer: Self-pay

## 2018-12-18 DIAGNOSIS — D72829 Elevated white blood cell count, unspecified: Secondary | ICD-10-CM | POA: Diagnosis not present

## 2018-12-18 DIAGNOSIS — E039 Hypothyroidism, unspecified: Secondary | ICD-10-CM

## 2018-12-19 ENCOUNTER — Other Ambulatory Visit: Payer: Self-pay | Admitting: Family Medicine

## 2018-12-19 DIAGNOSIS — D582 Other hemoglobinopathies: Secondary | ICD-10-CM

## 2018-12-19 LAB — CBC
Hematocrit: 54 % — ABNORMAL HIGH (ref 34.0–46.6)
Hemoglobin: 19.3 g/dL — ABNORMAL HIGH (ref 11.1–15.9)
MCH: 35.8 pg — ABNORMAL HIGH (ref 26.6–33.0)
MCHC: 35.7 g/dL (ref 31.5–35.7)
MCV: 100 fL — ABNORMAL HIGH (ref 79–97)
Platelets: 234 10*3/uL (ref 150–450)
RBC: 5.39 x10E6/uL — ABNORMAL HIGH (ref 3.77–5.28)
RDW: 13.4 % (ref 11.7–15.4)
WBC: 9.7 10*3/uL (ref 3.4–10.8)

## 2018-12-19 LAB — CMP14+EGFR
ALT: 24 IU/L (ref 0–32)
AST: 21 IU/L (ref 0–40)
Albumin/Globulin Ratio: 2.3 — ABNORMAL HIGH (ref 1.2–2.2)
Albumin: 4.3 g/dL (ref 3.8–4.9)
Alkaline Phosphatase: 130 IU/L — ABNORMAL HIGH (ref 39–117)
BUN/Creatinine Ratio: 11 (ref 9–23)
BUN: 13 mg/dL (ref 6–24)
Bilirubin Total: <0.2 mg/dL (ref 0.0–1.2)
CO2: 26 mmol/L (ref 20–29)
Calcium: 9.5 mg/dL (ref 8.7–10.2)
Chloride: 100 mmol/L (ref 96–106)
Creatinine, Ser: 1.21 mg/dL — ABNORMAL HIGH (ref 0.57–1.00)
GFR calc Af Amer: 59 mL/min/{1.73_m2} — ABNORMAL LOW (ref >59)
GFR calc non Af Amer: 51 mL/min/{1.73_m2} — ABNORMAL LOW (ref >59)
Globulin, Total: 1.9 g/dL (ref 1.5–4.5)
Glucose: 108 mg/dL — ABNORMAL HIGH (ref 65–99)
Potassium: 4.1 mmol/L (ref 3.5–5.2)
Sodium: 141 mmol/L (ref 134–144)
Total Protein: 6.2 g/dL (ref 6.0–8.5)

## 2018-12-19 LAB — THYROID PANEL WITH TSH
Free Thyroxine Index: 1.8 (ref 1.2–4.9)
T3 Uptake Ratio: 27 % (ref 24–39)
T4, Total: 6.7 ug/dL (ref 4.5–12.0)
TSH: 1.48 u[IU]/mL (ref 0.450–4.500)

## 2018-12-19 LAB — LIPID PANEL
Chol/HDL Ratio: 4.8 ratio — ABNORMAL HIGH (ref 0.0–4.4)
Cholesterol, Total: 212 mg/dL — ABNORMAL HIGH (ref 100–199)
HDL: 44 mg/dL (ref >39)
LDL Calculated: 134 mg/dL — ABNORMAL HIGH (ref 0–99)
Triglycerides: 171 mg/dL — ABNORMAL HIGH (ref 0–149)
VLDL Cholesterol Cal: 34 mg/dL (ref 5–40)

## 2018-12-22 ENCOUNTER — Telehealth: Payer: Self-pay

## 2018-12-22 NOTE — Telephone Encounter (Signed)
Patient called back to office to get lab results. These results had already been given but patient states that she forgot and wanted [place] to go over them again. She states that she has an appt with hematology. Today her right kidney started hurting. Declined any burning with urination or any other symptoms. Patient is concerned. Wants to know what you think it might be

## 2018-12-23 ENCOUNTER — Telehealth (HOSPITAL_COMMUNITY): Payer: Self-pay | Admitting: *Deleted

## 2018-12-23 ENCOUNTER — Other Ambulatory Visit (HOSPITAL_COMMUNITY): Payer: Self-pay | Admitting: Psychiatry

## 2018-12-23 MED ORDER — LAMOTRIGINE 100 MG PO TABS: 300.0000 mg | ORAL_TABLET | Freq: Every day | ORAL | 0 refills | Status: DC

## 2018-12-23 NOTE — Telephone Encounter (Signed)
ordered

## 2018-12-23 NOTE — Telephone Encounter (Signed)
PATIENT CALLED REQUESTED REFILL ON : lamoTRIgine (LAMICTAL) 100 MG tablet NEXT APPT 02/03/2019

## 2018-12-24 NOTE — Telephone Encounter (Signed)
Pt is good, no pain now. She will contact us if she needs [place]

## 2018-12-24 NOTE — Telephone Encounter (Signed)
Flank pain could be from a number of things including stone, infection, pulled muscle.   Rebecca Moreno, please review her lab result notes with her again.  May benefit from a mailed copy.

## 2018-12-29 ENCOUNTER — Ambulatory Visit (HOSPITAL_COMMUNITY): Payer: Medicare Other | Admitting: Psychiatry

## 2019-01-02 ENCOUNTER — Encounter (HOSPITAL_COMMUNITY): Payer: Self-pay | Admitting: Surgery

## 2019-01-05 ENCOUNTER — Inpatient Hospital Stay (HOSPITAL_COMMUNITY): Payer: Medicare Other | Attending: Hematology

## 2019-01-05 ENCOUNTER — Inpatient Hospital Stay (HOSPITAL_COMMUNITY): Payer: Medicare Other | Attending: Hematology | Admitting: Hematology

## 2019-01-05 ENCOUNTER — Encounter (HOSPITAL_COMMUNITY): Payer: Self-pay | Admitting: Hematology

## 2019-01-05 ENCOUNTER — Other Ambulatory Visit: Payer: Self-pay

## 2019-01-05 VITALS — BP 113/87 | HR 93 | Temp 97.1°F | Resp 20 | Wt 203.0 lb

## 2019-01-05 DIAGNOSIS — F329 Major depressive disorder, single episode, unspecified: Secondary | ICD-10-CM | POA: Diagnosis not present

## 2019-01-05 DIAGNOSIS — D751 Secondary polycythemia: Secondary | ICD-10-CM | POA: Insufficient documentation

## 2019-01-05 DIAGNOSIS — F17210 Nicotine dependence, cigarettes, uncomplicated: Secondary | ICD-10-CM | POA: Insufficient documentation

## 2019-01-05 DIAGNOSIS — Z8041 Family history of malignant neoplasm of ovary: Secondary | ICD-10-CM | POA: Diagnosis not present

## 2019-01-05 DIAGNOSIS — G4730 Sleep apnea, unspecified: Secondary | ICD-10-CM | POA: Diagnosis not present

## 2019-01-05 DIAGNOSIS — D7589 Other specified diseases of blood and blood-forming organs: Secondary | ICD-10-CM | POA: Insufficient documentation

## 2019-01-05 DIAGNOSIS — K219 Gastro-esophageal reflux disease without esophagitis: Secondary | ICD-10-CM | POA: Insufficient documentation

## 2019-01-05 DIAGNOSIS — Z86010 Personal history of colonic polyps: Secondary | ICD-10-CM | POA: Insufficient documentation

## 2019-01-05 DIAGNOSIS — Z800 Family history of malignant neoplasm of digestive organs: Secondary | ICD-10-CM | POA: Diagnosis not present

## 2019-01-05 DIAGNOSIS — Z79899 Other long term (current) drug therapy: Secondary | ICD-10-CM | POA: Diagnosis not present

## 2019-01-05 DIAGNOSIS — Z87442 Personal history of urinary calculi: Secondary | ICD-10-CM | POA: Insufficient documentation

## 2019-01-05 LAB — COMPREHENSIVE METABOLIC PANEL
ALT: 24 U/L (ref 0–44)
AST: 21 U/L (ref 15–41)
Albumin: 4.2 g/dL (ref 3.5–5.0)
Alkaline Phosphatase: 122 U/L (ref 38–126)
Anion gap: 8 (ref 5–15)
BUN: 14 mg/dL (ref 6–20)
CO2: 30 mmol/L (ref 22–32)
Calcium: 9.8 mg/dL (ref 8.9–10.3)
Chloride: 101 mmol/L (ref 98–111)
Creatinine, Ser: 1.17 mg/dL — ABNORMAL HIGH (ref 0.44–1.00)
GFR calc Af Amer: >60 mL/min (ref >60)
GFR calc non Af Amer: 53 mL/min — ABNORMAL LOW (ref >60)
Glucose, Bld: 82 mg/dL (ref 70–99)
Potassium: 4.5 mmol/L (ref 3.5–5.1)
Sodium: 139 mmol/L (ref 135–145)
Total Bilirubin: 0.4 mg/dL (ref 0.3–1.2)
Total Protein: 7.3 g/dL (ref 6.5–8.1)

## 2019-01-05 LAB — LACTATE DEHYDROGENASE: LDH: 212 U/L — ABNORMAL HIGH (ref 98–192)

## 2019-01-05 LAB — CBC WITH DIFFERENTIAL/PLATELET
Abs Immature Granulocytes: 0.07 10*3/uL (ref 0.00–0.07)
Basophils Absolute: 0.1 10*3/uL (ref 0.0–0.1)
Basophils Relative: 1 %
Eosinophils Absolute: 0 10*3/uL (ref 0.0–0.5)
Eosinophils Relative: 0 %
HCT: 60.1 % — ABNORMAL HIGH (ref 36.0–46.0)
Hemoglobin: 19.9 g/dL — ABNORMAL HIGH (ref 12.0–15.0)
Immature Granulocytes: 1 %
Lymphocytes Relative: 14 %
Lymphs Abs: 1.8 10*3/uL (ref 0.7–4.0)
MCH: 35.8 pg — ABNORMAL HIGH (ref 26.0–34.0)
MCHC: 33.1 g/dL (ref 30.0–36.0)
MCV: 108.1 fL — ABNORMAL HIGH (ref 80.0–100.0)
Monocytes Absolute: 1.1 10*3/uL — ABNORMAL HIGH (ref 0.1–1.0)
Monocytes Relative: 9 %
Neutro Abs: 9.4 10*3/uL — ABNORMAL HIGH (ref 1.7–7.7)
Neutrophils Relative %: 75 %
Platelets: 236 10*3/uL (ref 150–400)
RBC: 5.56 MIL/uL — ABNORMAL HIGH (ref 3.87–5.11)
RDW: 14.6 % (ref 11.5–15.5)
WBC: 12.3 10*3/uL — ABNORMAL HIGH (ref 4.0–10.5)
nRBC: 0 % (ref 0.0–0.2)

## 2019-01-05 LAB — CARBOXYHEMOGLOBIN - COOX: Carboxyhemoglobin: 15.8 % (ref 0.5–1.5)

## 2019-01-05 NOTE — Assessment & Plan Note (Addendum)
1.  Erythrocytosis: - CBC on 12/18/2018 shows elevated hemoglobin of 19.3 and hematocrit of 54.  White count and platelets were normal. - She has chronic headaches, 2-3 episodes per week.  These have not gotten any worse lately. -She denies any fevers or night sweats.  She had 25 pound weight loss since March as she went on a diet to lose weight. -She denies any vasomotor symptoms or aquagenic pruritus.  She reported history of kidney stone. - She is a current active smoker, half pack per day for 40 years.  She denies using any diuretics. -He also has sleep apnea and was using CPAP machine until beginning of the year.  She quit using it as she could not get the small size headgear needed for it. -CT renal study on 09/01/2018 shows mild left hydroureteronephrosis due to 4 mm mid left ureteral calculus.  Tiny left renal calculi.  Severe hepatic steatosis. - We will repeat her CBC today.  We will check her erythropoietin level.  We will also check for Jak 2 V617F mutation testing and reflex testing. - We will send a carboxyhemoglobin level.  We will also check a UA for hematuria.  We will see her back in 2 to 3 weeks for follow-up. - I think the most likely cause of erythrocytosis is sleep apnea.  2.  Macrocytosis: - She has macrocytosis in September 2019. -We will check [id number], folic acid and methylmalonic acid.  TSH was normal.

## 2019-01-05 NOTE — Progress Notes (Signed)
CONSULT NOTE  Patient Care Team: [name], DO as PCP - General (Family Medicine) Norman Clay, MD as Consulting Physician (Psychiatry) Daneil Dolin, MD as Consulting Physician (Gastroenterology) Ceasar Mons, MD as Consulting Physician (Urology)  CHIEF COMPLAINTS/PURPOSE OF CONSULTATION:  Erythrocytosis.  HISTORY OF PRESENTING ILLNESS:  Rebecca Moreno 53 y.o. female is seen for consultation of erythrocytosis at the request of Dr. Lajuana Ripple.  Recent CBC on 12/18/2018 showed hemoglobin of 19.3 and hematocrit of 54.  White count and platelets were normal.  MCV was elevated.  Patient reportedly has chronic headaches, 2-3 episodes per week.  They have not gotten any worse.  No fevers or night sweats.  She had 25 pound weight loss since March as she went on a diet and was trying to lose weight.  Denies any vasomotor symptoms or aquagenic pruritus.  She is a current active smoker, smokes about half pack per day for 40 years.  She also reportedly had sleep apnea and was using CPAP machine.  However she stopped using it in the early part of this year as she had received wrong size headgear for the machine.  Denies any gross hematuria.  Screening colonoscopy was on 05/12/2018, one 5 mm polyp at the splenic flexure.  Otherwise normal exam.  She denies any change in bowel habits or bleeding per rectum or melena.  Denies any nausea, vomiting, diarrhea or constipation.  No chest pains or lightheadedness noted.  Numbness in the left foot has been stable.  Appetite is 100%.  Energy levels are 75%.  Chronic cough is stable.  She used to work at a [employer] and is currently on disability.  She lives at home with her family.  Family history significant for sister with ovarian cancer and maternal grandmother with metastatic stomach cancer.  No family history of phlebotomies.  MEDICAL HISTORY:  Past Medical History:  Diagnosis Date  . Acute pyelonephritis   . Asthma   . Bipolar  disorder ([place])   . Colon polyp   . Complication of anesthesia    "sometimes hard to put to sleep"  . Depression    Bipolar  . Gastric ulcer    around 2013.  stress related  . GERD (gastroesophageal reflux disease)   . History of kidney stones   . Hyperlipidemia   . Hypothyroid   . Polyp, uterus corpus   . Sepsis due to Escherichia coli (E. coli) ([place]) 09/04/2018  . Sleep apnea    no cpap used    SURGICAL HISTORY: Past Surgical History:  Procedure Laterality Date  . ABLATION ON ENDOMETRIOSIS    . COLONOSCOPY     Per patient, done around 2013 in [place], had polyp and overdue for follow up.  . COLONOSCOPY WITH PROPOFOL N/A 05/12/2018   Procedure: COLONOSCOPY WITH PROPOFOL;  Surgeon: Daneil Dolin, MD;  [facility];  Service: Endoscopy;  Laterality: N/A;  12:00pm  . CYSTOSCOPY W/ URETERAL STENT PLACEMENT Left 09/01/2018   Procedure: CYSTOSCOPY WITH RETROGRADE PYELOGRAM/URETERAL STENT PLACEMENT;  Surgeon: Ceasar Mons, MD;  Location: [facility];  Service: Urology;  Laterality: Left;  . CYSTOSCOPY/URETEROSCOPY/HOLMIUM LASER/STENT PLACEMENT Left 09/17/2018   Procedure: CYSTOSCOPY/URETEROSCOPY/HOLMIUM LASER/STENT PLACEMENT;  Surgeon: Ceasar Mons, MD;  Location: [facility];  Service: Urology;  Laterality: Left;  ONLY NEEDS 45 MIN  . OVARIAN CYST SURGERY Left   . POLYPECTOMY  05/12/2018   Procedure: POLYPECTOMY;  Surgeon: Daneil Dolin, MD;  [facility]: [facility];  Service: Endoscopy;;  polyp at splenic flexure  . TEMPOROMANDIBULAR JOINT SURGERY     9 surgeries  . TUBAL LIGATION    . vaginal childbirth     1    SOCIAL HISTORY: Social History   Socioeconomic History  . Marital status: Married    Spouse name: Not on file  . Number of children: 1  . Years of education: Not on file  . Highest education level: High school graduate  Occupational History  . Occupation: disability  Social Needs  . Financial resource strain: Not hard at all  . Food  insecurity    Worry: Never true    Inability: Never true  . Transportation needs    Medical: No    Non-medical: No  Tobacco Use  . Smoking status: Current Every Day Smoker    Packs/day: 0.50    Years: 30.00    Pack years: 15.00    Types: Cigarettes  . Smokeless tobacco: Never Used  Substance and Sexual Activity  . Alcohol use: Never    Frequency: Never  . Drug use: Not Currently  . Sexual activity: Not Currently    Birth control/protection: Surgical    Comment: tubal  Lifestyle  . Physical activity    Days per week: 0 days    Minutes per session: 0 min  . Stress: To some extent  Relationships  . Social connections    Talks on phone: More than three times a week    Gets together: More than three times a week    Attends religious service: Never    Active member of club or organization: No    Attends meetings of clubs or organizations: Never    Relationship status: Married  . Intimate partner violence    Fear of current or ex partner: No    Emotionally abused: No    Physically abused: No    Forced sexual activity: No  Other Topics Concern  . Not on file  Social History Narrative  . Not on file    FAMILY HISTORY: Family History  Problem Relation Age of Onset  . Anxiety disorder Mother   . Hypertension Mother   . Heart failure Mother   . Stroke Mother   . Hyperlipidemia Father   . Diabetes Father   . Stroke Father   . Hypertension Father   . Colon polyps Father        older than 27  . Diabetes Sister   . Hypertension Sister   . Ovarian cancer Sister   . Asthma Daughter   . Ovarian cancer Maternal Grandmother   . Colon cancer Maternal Grandmother   . Renal Disease Paternal Grandmother   . Heart attack Paternal Grandfather   . Bipolar disorder Other     ALLERGIES:  is allergic to lithium.  MEDICATIONS:  Current Outpatient Medications  Medication Sig Dispense Refill  . baclofen (LIORESAL) 10 MG tablet TAKE 1 TABLET BY MOUTH TWICE A DAY AS NEEDED    MAX   1 2 TIMES/WEEK    . CALCIUM CITRATE-VITAMIN D3 PO Take 1 tablet by mouth daily.    . Cariprazine HCl (VRAYLAR) 6 MG CAPS Take 1 capsule (6 mg total) by mouth daily. 30 capsule 2  . Cholecalciferol (VITAMIN D3) 5000 units TABS Take 5,000 Units by mouth daily.     . clonazePAM (KLONOPIN) 1 MG tablet Take 1 tablet (1 mg total) by mouth 2 (two) times daily as needed for anxiety. 60 tablet 0  . Dexlansoprazole (DEXILANT) 30 MG  capsule Take 30 mg by mouth daily.    . famotidine (PEPCID) 20 MG tablet Take 20 mg by mouth daily.    [place] Kitchen lamoTRIgine (LAMICTAL) 100 MG tablet Take 3 tablets (300 mg total) by mouth at bedtime. 90 tablet 0  . levothyroxine (SYNTHROID, LEVOTHROID) 50 MCG tablet Take 50 mcg by mouth daily before breakfast.    . loratadine (CLARITIN) 10 MG tablet Take 1 tablet (10 mg total) by mouth daily. (Patient taking differently: Take 10 mg by mouth daily as needed. ) 30 tablet 11  . magnesium gluconate (MAGONATE) 500 MG tablet Take 500 mg by mouth daily.    . Multiple Vitamins-Minerals (MULTIVITAMIN WOMEN PO) Take 1 tablet by mouth daily.    . QUEtiapine (SEROQUEL) 300 MG tablet Take 1 tablet (300 mg total) by mouth at bedtime. 90 tablet 2  . venlafaxine XR (EFFEXOR-XR) 150 MG 24 hr capsule Take 2 capsules (300 mg total) by mouth daily. 60 capsule 2  . zonisamide (ZONEGRAN) 25 MG capsule TAKE 4 CAPSULES BY ORAL ROUTE DAILY FOR 30 DAYS    . albuterol (PROVENTIL HFA;VENTOLIN HFA) 108 (90 Base) MCG/ACT inhaler Inhale 1 puff into the lungs every 6 (six) hours as needed for wheezing or shortness of breath.    . fluticasone (FLONASE) 50 MCG/ACT nasal spray Place 2 sprays into both nostrils daily. (Patient not taking: Reported on 01/05/2019) 16 g 6  . fluticasone (FLOVENT HFA) 220 MCG/ACT inhaler Inhale 1 puff into the lungs daily.      No current facility-administered medications for this visit.     REVIEW OF SYSTEMS:   Constitutional: Denies fevers, chills or abnormal night sweats Eyes: Denies  blurriness of vision, double vision or watery eyes Ears, nose, mouth, throat, and face: Denies mucositis or sore throat Respiratory: Chronic cough is stable.   Cardiovascular: Denies palpitation, chest discomfort or lower extremity swelling Gastrointestinal:  Denies nausea, heartburn or change in bowel habits Skin: Denies abnormal skin rashes Lymphatics: Denies new lymphadenopathy or easy bruising Neurological:Denies numbness, tingling or new weaknesses Behavioral/Psych: Mood is stable, no new changes  All other systems were reviewed with the patient and are negative.  PHYSICAL EXAMINATION: ECOG PERFORMANCE STATUS: 1 - Symptomatic but completely ambulatory  Vitals:   01/05/19 1311  BP: 113/87  Pulse: 93  Resp: 20  Temp: (!) 97.1 F (36.2 C)  SpO2: 96%   Filed Weights   01/05/19 1311  Weight: 203 lb (92.1 kg)    GENERAL:alert, no distress and comfortable SKIN: skin color, texture, turgor are normal, no rashes or significant lesions EYES: normal, conjunctiva are pink and non-injected, sclera clear OROPHARYNX:no exudate, no erythema and lips, buccal mucosa, and tongue normal  NECK: supple, thyroid normal size, non-tender, without nodularity LYMPH:  no palpable lymphadenopathy in the cervical, axillary or inguinal LUNGS: clear to auscultation and percussion with normal breathing effort HEART: regular rate & rhythm and no murmurs and no lower extremity edema ABDOMEN:abdomen soft, non-tender and normal bowel sounds Musculoskeletal:no cyanosis of digits and no clubbing  PSYCH: alert & oriented x 3 with fluent speech NEURO: no focal motor/sensory deficits  LABORATORY DATA:  I have reviewed the data as listed Recent Results (from the past 2160 hour(s))  Lipid Panel     Status: Abnormal   Collection Time: 12/18/18  1:23 PM  Result Value Ref Range   Cholesterol, Total 212 (H) 100 - 199 mg/dL   Triglycerides 171 (H) 0 - 149 mg/dL   HDL 44 >39 mg/dL   VLDL  Cholesterol Cal 34 5 -  40 mg/dL   LDL Calculated 134 (H) 0 - 99 mg/dL   Chol/HDL Ratio 4.8 (H) 0.0 - 4.4 ratio    Comment:                                   T. Chol/HDL Ratio                                             Men  Women                               1/2 Avg.Risk  3.4    3.3                                   Avg.Risk  5.0    4.4                                2X Avg.Risk  9.6    7.1                                3X Avg.Risk 23.4   11.0   CBC     Status: Abnormal   Collection Time: 12/18/18  1:23 PM  Result Value Ref Range   WBC 9.7 3.4 - 10.8 x10E3/uL   RBC 5.39 (H) 3.77 - 5.28 x10E6/uL   Hemoglobin 19.3 (H) 11.1 - 15.9 g/dL   Hematocrit 54.0 (H) 34.0 - 46.6 %   MCV 100 (H) 79 - 97 fL   MCH 35.8 (H) 26.6 - 33.0 pg   MCHC 35.7 31.5 - 35.7 g/dL   RDW 13.4 11.7 - 15.4 %   Platelets 234 150 - 450 x10E3/uL  CMP14+EGFR     Status: Abnormal   Collection Time: 12/18/18  1:23 PM  Result Value Ref Range   Glucose 108 (H) 65 - 99 mg/dL   BUN 13 6 - 24 mg/dL   Creatinine, Ser 1.21 (H) 0.57 - 1.00 mg/dL   GFR calc non Af Amer 51 (L) >59 mL/min/1.73   GFR calc Af Amer 59 (L) >59 mL/min/1.73   BUN/Creatinine Ratio 11 9 - 23   Sodium 141 134 - 144 mmol/L   Potassium 4.1 3.5 - 5.2 mmol/L   Chloride 100 96 - 106 mmol/L   CO2 26 20 - 29 mmol/L   Calcium 9.5 8.7 - 10.2 mg/dL   Total Protein 6.2 6.0 - 8.5 g/dL   Albumin 4.3 3.8 - 4.9 g/dL   Globulin, Total 1.9 1.5 - 4.5 g/dL   Albumin/Globulin Ratio 2.3 (H) 1.2 - 2.2   Bilirubin Total <0.2 0.0 - 1.2 mg/dL   Alkaline Phosphatase 130 (H) 39 - 117 IU/L   AST 21 0 - 40 IU/L   ALT 24 0 - 32 IU/L  Thyroid Panel With TSH     Status: None   Collection Time: 12/18/18  1:23 PM  Result Value Ref Range   TSH 1.480 0.450 - 4.500 uIU/mL   T4, Total 6.7 4.5 - 12.0 ug/dL  T3 Uptake Ratio 27 24 - 39 %   Free Thyroxine Index 1.8 1.2 - 4.9    RADIOGRAPHIC STUDIES: I have personally reviewed the radiological images as listed and agreed with the findings in the  report.  ASSESSMENT & PLAN:  Erythrocytosis 1.  Erythrocytosis: - CBC on 12/18/2018 shows elevated hemoglobin of 19.3 and hematocrit of 54.  White count and platelets were normal. - She has chronic headaches, 2-3 episodes per week.  These have not gotten any worse lately. -She denies any fevers or night sweats.  She had 25 pound weight loss since March as she went on a diet to lose weight. -She denies any vasomotor symptoms or aquagenic pruritus.  She reported history of kidney stone. - She is a current active smoker, half pack per day for 40 years.  She denies using any diuretics. -He also has sleep apnea and was using CPAP machine until beginning of the year.  She quit using it as she could not get the small size headgear needed for it. -CT renal study on 09/01/2018 shows mild left hydroureteronephrosis due to 4 mm mid left ureteral calculus.  Tiny left renal calculi.  Severe hepatic steatosis. - We will repeat her CBC today.  We will check her erythropoietin level.  We will also check for Jak 2 V617F mutation testing and reflex testing. - We will send a carboxyhemoglobin level.  We will also check a UA for hematuria.  We will see her back in 2 to 3 weeks for follow-up. - I think the most likely cause of erythrocytosis is sleep apnea.  2.  Macrocytosis: - She has macrocytosis in September 2019. -We will check W38, folic acid and methylmalonic acid.  TSH was normal.     All questions were answered. The patient knows to call the clinic with any problems, questions or concerns.      Derek Jack, MD 01/05/19 2:12 PM

## 2019-01-05 NOTE — Patient Instructions (Signed)
[place] at [facility] Discharge Instructions  You were seen today by Dr. Delton Coombes. He went over your history, family history and how you've been feeling lately. He will get blood drawn today. He will see you back in 3 weeks for follow up.   Thank you for choosing [place] at [facility] to provide your oncology and hematology care.  To afford each patient quality time with our provider, please arrive at least 15 minutes before your scheduled appointment time.   If you have a lab appointment with the [facility] please come in thru the  Main Entrance and check in at the main information desk  You need to re-schedule your appointment should you arrive 10 or more minutes late.  We strive to give you quality time with our providers, and arriving late affects you and other patients whose appointments are after yours.  Also, if you no show three or more times for appointments you may be dismissed from the clinic at the providers discretion.     Again, thank you for choosing [facility].  Our hope is that these requests will decrease the amount of time that you wait before being seen by our physicians.       _____________________________________________________________  Should you have questions after your visit to [facility], please contact our office at [phone] between the hours of 8:00 a.m. and 4:30 p.m.  Voicemails left after 4:00 p.m. will not be returned until the following business day.  For prescription refill requests, have your pharmacy contact our office and allow 72 hours.    Cancer Center Support Programs:   > Cancer Support Group  2nd Tuesday of the month 1pm-2pm, Journey Room

## 2019-01-05 NOTE — Progress Notes (Signed)
CRITICAL VALUE ALERT  Critical Value:  Carboxyhemoglobin 15.8  Date & Time Notied:  01/05/2019 at 1447  Provider Notified: Dr. Delton Coombes  Orders Received/Actions taken: have ABG + arterial carboxyhemoglobin collected; respiratory therapy notified and pt scheduled to have this collected 01/06/2019 at 1030. Contacted pt and notified.

## 2019-01-06 ENCOUNTER — Telehealth (HOSPITAL_COMMUNITY): Payer: Self-pay | Admitting: *Deleted

## 2019-01-06 ENCOUNTER — Other Ambulatory Visit: Payer: Self-pay

## 2019-01-06 ENCOUNTER — Inpatient Hospital Stay (HOSPITAL_COMMUNITY): Payer: Medicare Other

## 2019-01-06 ENCOUNTER — Inpatient Hospital Stay (HOSPITAL_COMMUNITY): Payer: Medicare Other | Attending: Hematology

## 2019-01-06 DIAGNOSIS — T5891XD Toxic effect of carbon monoxide from unspecified source, accidental (unintentional), subsequent encounter: Secondary | ICD-10-CM

## 2019-01-06 DIAGNOSIS — G4730 Sleep apnea, unspecified: Secondary | ICD-10-CM | POA: Diagnosis not present

## 2019-01-06 DIAGNOSIS — E039 Hypothyroidism, unspecified: Secondary | ICD-10-CM | POA: Diagnosis not present

## 2019-01-06 DIAGNOSIS — E785 Hyperlipidemia, unspecified: Secondary | ICD-10-CM | POA: Diagnosis not present

## 2019-01-06 DIAGNOSIS — Z79899 Other long term (current) drug therapy: Secondary | ICD-10-CM | POA: Diagnosis not present

## 2019-01-06 DIAGNOSIS — Z8711 Personal history of peptic ulcer disease: Secondary | ICD-10-CM | POA: Diagnosis not present

## 2019-01-06 DIAGNOSIS — Z86010 Personal history of colonic polyps: Secondary | ICD-10-CM | POA: Insufficient documentation

## 2019-01-06 DIAGNOSIS — D751 Secondary polycythemia: Secondary | ICD-10-CM | POA: Insufficient documentation

## 2019-01-06 DIAGNOSIS — R790 Abnormal level of blood mineral: Secondary | ICD-10-CM | POA: Diagnosis not present

## 2019-01-06 DIAGNOSIS — F418 Other specified anxiety disorders: Secondary | ICD-10-CM | POA: Insufficient documentation

## 2019-01-06 DIAGNOSIS — F17210 Nicotine dependence, cigarettes, uncomplicated: Secondary | ICD-10-CM | POA: Insufficient documentation

## 2019-01-06 DIAGNOSIS — K219 Gastro-esophageal reflux disease without esophagitis: Secondary | ICD-10-CM | POA: Insufficient documentation

## 2019-01-06 DIAGNOSIS — Z87442 Personal history of urinary calculi: Secondary | ICD-10-CM | POA: Diagnosis not present

## 2019-01-06 LAB — ERYTHROPOIETIN: Erythropoietin: 15.9 m[IU]/mL (ref 2.6–18.5)

## 2019-01-06 LAB — BLOOD GAS, ARTERIAL
Acid-Base Excess: 4.8 mmol/L — ABNORMAL HIGH (ref 0.0–2.0)
Bicarbonate: 27 mmol/L (ref 20.0–28.0)
FIO2: 21
O2 Saturation: 89.3 %
Patient temperature: 37
pCO2 arterial: 52.3 mmHg — ABNORMAL HIGH (ref 32.0–48.0)
pH, Arterial: 7.374 (ref 7.350–7.450)
pO2, Arterial: 51.5 mmHg — ABNORMAL LOW (ref 83.0–108.0)

## 2019-01-06 LAB — CARBOXYHEMOGLOBIN - COOX: Carboxyhemoglobin: 18.4 % (ref 0.5–1.5)

## 2019-01-06 NOTE — Progress Notes (Signed)
CRITICAL VALUE ALERT  Critical Value:  Carboxyhemoglobin 18.4  Date & Time Notied:  01/06/2019 at 1101  Provider Notified: Dr. Delton Coombes  Orders Received/Actions taken:   Pt presents to clinic for ABG and arterial carboxyhemoglobin after having a critical high venous carboxyhemoglobin yesterday.  Dr. Delton Coombes instructed me to advise pt to install a carbon monoxide detector in her home.  Pt denies using her wood stove at this time.  Reports she also has gas heat, but she has not been using this recently either.  Instructed pt, in addition to installing a CO detector in her home, to call her utilities company ([employer]) and let them know that she has had blood work done recently that is concerning for exposure to carbon monoxide and have them come check her gas lines for possible leak.  Also instructed pt to report to the ED should she begin to experience any adverse s/s - HA, shortness of breath, dizziness, weakness/fatigue.  She verbalizes understanding of the above.  She reports at this time she feels "fine".  Denies SOB, HA, dizziness, weakness/fatigue.  Dr. Delton Coombes also recommends referral to Dr. Luan Pulling for further evaluation of abnormal ABG results.

## 2019-01-06 NOTE — Telephone Encounter (Signed)
Encounter created in error

## 2019-01-13 ENCOUNTER — Other Ambulatory Visit (HOSPITAL_COMMUNITY): Payer: Self-pay | Admitting: Psychiatry

## 2019-01-13 ENCOUNTER — Telehealth (HOSPITAL_COMMUNITY): Payer: Self-pay | Admitting: *Deleted

## 2019-01-13 ENCOUNTER — Telehealth (HOSPITAL_COMMUNITY): Payer: Self-pay | Admitting: Psychiatry

## 2019-01-13 MED ORDER — LAMOTRIGINE 100 MG PO TABS: 300.0000 mg | ORAL_TABLET | Freq: Every day | ORAL | 0 refills | Status: DC

## 2019-01-13 NOTE — Telephone Encounter (Signed)
Could you contact [facility] [place] to cancel any remaining order from me? Thanks.

## 2019-01-13 NOTE — Telephone Encounter (Signed)
Correction. Ordered lamotrigine refill to last until her next appointment.

## 2019-01-13 NOTE — Telephone Encounter (Signed)
[facility]2ND ON PREFERRED Rx LIST) IS NOW HELPING TO MANGE & PRE PACK MEDICATION FOR PATIENT. REQUEST E-SCRIPT FOR lamoTRIgine (LAMICTAL) 100 MG tablet

## 2019-01-13 NOTE — Telephone Encounter (Signed)
DONE PER PROVIDER NOTIFIED: contacted [facility] to cancel any remaining order's

## 2019-01-13 NOTE — Telephone Encounter (Signed)
I ordered that medication for 90 days in August.

## 2019-01-13 NOTE — Telephone Encounter (Signed)
[facility]AL [facility] [place] Receipt confirmed by pharmacy (01/13/2019 AND THIS SCRIPT IS NEEDED FOR THE [facility] (2 ND ON Rx PREFERENCE LIST)

## 2019-01-15 LAB — CALR + JAK2 E12-15 + MPL (REFLEXED)

## 2019-01-15 LAB — JAK2 V617F, W REFLEX TO CALR/E12/MPL

## 2019-01-19 ENCOUNTER — Telehealth (HOSPITAL_COMMUNITY): Payer: Self-pay | Admitting: *Deleted

## 2019-01-19 ENCOUNTER — Other Ambulatory Visit (HOSPITAL_COMMUNITY): Payer: Self-pay | Admitting: Psychiatry

## 2019-01-19 MED ORDER — CLONAZEPAM 1 MG PO TABS: 1.0000 mg | ORAL_TABLET | Freq: Two times a day (BID) | ORAL | 0 refills | Status: DC | PRN

## 2019-01-19 NOTE — Telephone Encounter (Signed)
ordered

## 2019-01-19 NOTE — Telephone Encounter (Signed)
PATIENT CALLED FOR A REFILL FOR THE clonazePAM (KLONOPIN) 1 MG tablet , Take 1 tablet (1 mg total) by mouth 2 (two) times daily as needed for anxiety. WHEN ASKED HAS SHE TAKEN 60 TABLETS SINCE: Receipt confirmed by pharmacy (12/11/2018 )? AS NEEDED OR DOES SHE TAKE 2 X/DAILY?  AND SHE STATED YES SHE HAS BEEN TAKEN 2 X/DAILY

## 2019-01-22 ENCOUNTER — Other Ambulatory Visit (HOSPITAL_COMMUNITY): Payer: Self-pay | Admitting: Psychiatry

## 2019-01-22 ENCOUNTER — Telehealth (HOSPITAL_COMMUNITY): Payer: Self-pay | Admitting: *Deleted

## 2019-01-22 MED ORDER — CLONAZEPAM 1 MG PO TBDP: 1.0000 mg | ORAL_TABLET | Freq: Two times a day (BID) | ORAL | 0 refills | Status: DC | PRN

## 2019-01-22 MED ORDER — CLONAZEPAM 1 MG PO TABS: 1.0000 mg | ORAL_TABLET | Freq: Two times a day (BID) | ORAL | 0 refills | Status: DC

## 2019-01-22 NOTE — Telephone Encounter (Signed)
PER Rx 12/11/2018 LAST REFILL ON HER KLONOPIN

## 2019-01-22 NOTE — Telephone Encounter (Signed)
[facility] [place]

## 2019-01-22 NOTE — Telephone Encounter (Signed)
PATIENT CALLED ON YESTERDAY  REQUESTED REFILL ON CLONAZEPAM STATED SHE WAS OUT.  AND WHEN INFORMED ABOUT THE  CHANGE OVER TO [facility] THAT WOULD PRE-PACK PILLS TO HELP MANAGE. SHE SEEMED CONFUSED. I CALLED [facility] TO SEE WHO SIGNED PATIENT UP FOR THIS AND THEY STATED THAT THE CAREGIVER OR CUSTOMER SIGNS UP  & IN THIS CASE THE HUSBAND Rebecca Moreno SIGNED UP HIS WIFE. THEN CHECKED INTO WHEN SHE COULD EXPECT HER MED'S  & THEY STATED DIT WOULD BE 02/25/2019. SO WHAT "BOB THE PHARMACIST @ [facility]" REFER TO AS A "QUICK SALE" A SCRIPT IS NEED FOR 30-DAY SUPPLY TO BE SENT TO THE : [id number] [place], [place] - [address] SINCE PATIENT IS OUT THAT WOULD LAST UNTIL SHE GETS HER MED'S SHIPPED & IN STORE ONT 02/25/2019

## 2019-01-22 NOTE — Telephone Encounter (Signed)
For clarification, when was the last time the medication was filled, and how many tabs?

## 2019-01-26 ENCOUNTER — Inpatient Hospital Stay (HOSPITAL_BASED_OUTPATIENT_CLINIC_OR_DEPARTMENT_OTHER): Payer: Medicare Other | Admitting: Hematology

## 2019-01-26 ENCOUNTER — Other Ambulatory Visit: Payer: Self-pay

## 2019-01-26 ENCOUNTER — Encounter (HOSPITAL_COMMUNITY): Payer: Self-pay | Admitting: Hematology

## 2019-01-26 VITALS — BP 121/71 | HR 86 | Temp 97.3°F | Resp 18 | Wt 211.3 lb

## 2019-01-26 DIAGNOSIS — G4730 Sleep apnea, unspecified: Secondary | ICD-10-CM | POA: Diagnosis not present

## 2019-01-26 DIAGNOSIS — E039 Hypothyroidism, unspecified: Secondary | ICD-10-CM | POA: Diagnosis not present

## 2019-01-26 DIAGNOSIS — Z86010 Personal history of colonic polyps: Secondary | ICD-10-CM | POA: Diagnosis not present

## 2019-01-26 DIAGNOSIS — D751 Secondary polycythemia: Secondary | ICD-10-CM

## 2019-01-26 DIAGNOSIS — E785 Hyperlipidemia, unspecified: Secondary | ICD-10-CM | POA: Diagnosis not present

## 2019-01-26 DIAGNOSIS — K219 Gastro-esophageal reflux disease without esophagitis: Secondary | ICD-10-CM | POA: Diagnosis not present

## 2019-01-26 DIAGNOSIS — R790 Abnormal level of blood mineral: Secondary | ICD-10-CM | POA: Diagnosis not present

## 2019-01-26 DIAGNOSIS — Z79899 Other long term (current) drug therapy: Secondary | ICD-10-CM | POA: Diagnosis not present

## 2019-01-26 DIAGNOSIS — Z8711 Personal history of peptic ulcer disease: Secondary | ICD-10-CM | POA: Diagnosis not present

## 2019-01-26 DIAGNOSIS — Z87442 Personal history of urinary calculi: Secondary | ICD-10-CM | POA: Diagnosis not present

## 2019-01-26 NOTE — Patient Instructions (Signed)
[place] at [facility] Discharge Instructions  You were seen today by Dr. Delton Coombes. He went over your recent lab results. He would like to schedule you for a therapeutic phlebotomy. He will see you back in 4 weeks for labs and follow up.   Thank you for choosing [place] at [facility] to provide your oncology and hematology care.  To afford each patient quality time with our provider, please arrive at least 15 minutes before your scheduled appointment time.   If you have a lab appointment with the [place] please come in thru the  Main Entrance and check in at the main information desk  You need to re-schedule your appointment should you arrive 10 or more minutes late.  We strive to give you quality time with our providers, and arriving late affects you and other patients whose appointments are after yours.  Also, if you no show three or more times for appointments you may be dismissed from the clinic at the providers discretion.     Again, thank you for choosing [facility].  Our hope is that these requests will decrease the amount of time that you wait before being seen by our physicians.       _____________________________________________________________  Should you have questions after your visit to [facility], please contact our office at [phone] between the hours of 8:00 a.m. and 4:30 p.m.  Voicemails left after 4:00 p.m. will not be returned until the following business day.  For prescription refill requests, have your pharmacy contact our office and allow 72 hours.    Cancer Center Support Programs:   > Cancer Support Group  2nd Tuesday of the month 1pm-2pm, Journey Room

## 2019-01-26 NOTE — Assessment & Plan Note (Signed)
1.  Rebecca Moreno negative erythrocytosis: - She denies any blurring of vision.  She has chronic headaches for little over a year.  They have not changed. -She is current active smoker, half pack per day for 40 years.  She is not on any diuretics.  Denies any steroid use. - Hemoglobin was 19.9 with hematocrit of 60 on 01/05/2019.  Rebecca Moreno and other mutations were negative. -Erythropoietin was 15.9.  White count was minimally high at 12.3 but was normal on the prior visit. - She also has sleep apnea and is not using CPAP machine from the beginning of this year.  She quit using it as she could not get the small size headgear needed for it. - CT renal study on 09/01/2018 shows mild left hydro ureteral nephrosis due to 4 mm mid left ureteral calculus.  Tiny left renal calculus.  Severe hepatic steatosis. - Based on her high hemoglobin and hematocrit, I have recommended one phlebotomy.  We will repeat her labs in 4 weeks. -I have counseled her to start using CPAP machine and quit smoking.  Moreno.  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  ABG shows PO2 of 51 and PCO2 of 52.  Elevated carboxyhemoglobin is out of proportion to her smoking. - She has carbon monoxide alarm at home placed at our request.  She denies any chronic exposure to vehicle exhaust. -She has an appointment to see Dr. Luan Pulling tomorrow.

## 2019-01-26 NOTE — Progress Notes (Signed)
[facility] [place], [place] [address]   CLINIC:  Medical Oncology/Hematology  PCP:  [name], DO [place] [id number] [phone]   REASON FOR VISIT:  Follow-up for erythrocytosis and carboxyhemoglobinemia.  CURRENT THERAPY: Phlebotomy.   INTERVAL HISTORY:  Ms. Rebecca Moreno 53 y.o. female seen for follow-up of secondary erythrocytosis.  She was initially evaluated for erythrocytosis.  She was found to have elevated carboxyhemoglobin.  We have called her power company.  She does have gas utensils at home.  She denies any chronic exposure to vehicle exhaust.  Denies any change in her chronic headaches for more than a year.  Denies any vision changes.  She is continuing to smoke half pack per day.    REVIEW OF SYSTEMS:  Review of Systems  Constitutional: Positive for fatigue.  Neurological: Positive for headaches.  All other systems reviewed and are negative.    PAST MEDICAL/SURGICAL HISTORY:  Past Medical History:  Diagnosis Date  . Acute pyelonephritis   . Asthma   . Bipolar disorder ([place])   . Colon polyp   . Complication of anesthesia    "sometimes hard to put to sleep"  . Depression    Bipolar  . Gastric ulcer    around 2013.  stress related  . GERD (gastroesophageal reflux disease)   . History of kidney stones   . Hyperlipidemia   . Hypothyroid   . Polyp, uterus corpus   . Sepsis due to Escherichia coli (E. coli) ([place]) 09/04/2018  . Sleep apnea    no cpap used   Past Surgical History:  Procedure Laterality Date  . ABLATION ON ENDOMETRIOSIS    . COLONOSCOPY     Per patient, done around 2013 in [place], had polyp and overdue for follow up.  . COLONOSCOPY WITH PROPOFOL N/A 05/12/2018   Procedure: COLONOSCOPY WITH PROPOFOL;  Surgeon: Daneil Dolin, MD;  [facility];  Service: Endoscopy;  Laterality: N/A;  12:00pm  . CYSTOSCOPY W/ URETERAL STENT PLACEMENT Left 09/01/2018   Procedure: CYSTOSCOPY  WITH RETROGRADE PYELOGRAM/URETERAL STENT PLACEMENT;  Surgeon: Ceasar Mons, MD;  [facility]: [facility];  Service: Urology;  Laterality: Left;  . CYSTOSCOPY/URETEROSCOPY/HOLMIUM LASER/STENT PLACEMENT Left 09/17/2018   Procedure: CYSTOSCOPY/URETEROSCOPY/HOLMIUM LASER/STENT PLACEMENT;  Surgeon: Ceasar Mons, MD;  Location: [facility];  Service: Urology;  Laterality: Left;  ONLY NEEDS 45 MIN  . OVARIAN CYST SURGERY Left   . POLYPECTOMY  05/12/2018   Procedure: POLYPECTOMY;  Surgeon: Daneil Dolin, MD;  [facility];  Service: Endoscopy;;  polyp at splenic flexure  . TEMPOROMANDIBULAR JOINT SURGERY     9 surgeries  . TUBAL LIGATION    . vaginal childbirth     1     SOCIAL HISTORY:  Social History   Socioeconomic History  . Marital status: Married    Spouse name: Not on file  . Number of children: 1  . Years of education: Not on file  . Highest education level: High school graduate  Occupational History  . Occupation: disability  Social Needs  . Financial resource strain: Not hard at all  . Food insecurity    Worry: Never true    Inability: Never true  . Transportation needs    Medical: No    Non-medical: No  Tobacco Use  . Smoking status: Current Every Day Smoker    Packs/day: 0.50    Years: 30.00    Pack years: 15.00  Types: Cigarettes  . Smokeless tobacco: Never Used  Substance and Sexual Activity  . Alcohol use: Never    Frequency: Never  . Drug use: Not Currently  . Sexual activity: Not Currently    Birth control/protection: Surgical    Comment: tubal  Lifestyle  . Physical activity    Days per week: 0 days    Minutes per session: 0 min  . Stress: To some extent  Relationships  . Social connections    Talks on phone: More than three times a week    Gets together: More than three times a week    Attends religious service: Never    Active member of club or organization: No    Attends meetings of clubs or organizations: Never     Relationship status: Married  . Intimate partner violence    Fear of current or ex partner: No    Emotionally abused: No    Physically abused: No    Forced sexual activity: No  Other Topics Concern  . Not on file  Social History Narrative  . Not on file    FAMILY HISTORY:  Family History  Problem Relation Age of Onset  . Anxiety disorder Mother   . Hypertension Mother   . Heart failure Mother   . Stroke Mother   . Hyperlipidemia Father   . Diabetes Father   . Stroke Father   . Hypertension Father   . Colon polyps Father        older than 37  . Diabetes Sister   . Hypertension Sister   . Ovarian cancer Sister   . Asthma Daughter   . Ovarian cancer Maternal Grandmother   . Colon cancer Maternal Grandmother   . Renal Disease Paternal Grandmother   . Heart attack Paternal Grandfather   . Bipolar disorder Other     CURRENT MEDICATIONS:  Outpatient Encounter Medications as of 01/26/2019  Medication Sig  . CALCIUM CITRATE-VITAMIN D3 PO Take 1 tablet by mouth daily.  . Cariprazine HCl (VRAYLAR) 6 MG CAPS Take 1 capsule (6 mg total) by mouth daily.  . Cholecalciferol (VITAMIN D3) 5000 units TABS Take 5,000 Units by mouth daily.   . clonazePAM (KLONOPIN) 1 MG disintegrating tablet Take 1 tablet (1 mg total) by mouth 2 (two) times daily as needed (anxiety).  . clonazePAM (KLONOPIN) 1 MG tablet Take 1 tablet (1 mg total) by mouth 2 (two) times daily.  Marland Kitchen Dexlansoprazole (DEXILANT) 30 MG capsule Take 30 mg by mouth daily.  . famotidine (PEPCID) 20 MG tablet Take 20 mg by mouth daily.  . fluticasone (FLOVENT HFA) 220 MCG/ACT inhaler Inhale 1 puff into the lungs daily.   Marland Kitchen lamoTRIgine (LAMICTAL) 100 MG tablet Take 3 tablets (300 mg total) by mouth at bedtime.  Marland Kitchen levothyroxine (SYNTHROID, LEVOTHROID) 50 MCG tablet Take 50 mcg by mouth daily before breakfast.  . magnesium gluconate (MAGONATE) 500 MG tablet Take 500 mg by mouth daily.  . Multiple Vitamins-Minerals (MULTIVITAMIN WOMEN  PO) Take 1 tablet by mouth daily.  . QUEtiapine (SEROQUEL) 300 MG tablet Take 1 tablet (300 mg total) by mouth at bedtime.  Marland Kitchen venlafaxine XR (EFFEXOR-XR) 150 MG 24 hr capsule Take 2 capsules (300 mg total) by mouth daily.  Marland Kitchen zonisamide (ZONEGRAN) 25 MG capsule TAKE 4 CAPSULES BY ORAL ROUTE DAILY FOR 30 DAYS  . albuterol (PROVENTIL HFA;VENTOLIN HFA) 108 (90 Base) MCG/ACT inhaler Inhale 1 puff into the lungs every 6 (six) hours as needed for wheezing or shortness of  breath.  . baclofen (LIORESAL) 10 MG tablet TAKE 1 TABLET BY MOUTH TWICE A DAY AS NEEDED    MAX  1 2 TIMES/WEEK  . fluticasone (FLONASE) 50 MCG/ACT nasal spray Place 2 sprays into both nostrils daily. (Patient not taking: Reported on 01/05/2019)  . loratadine (CLARITIN) 10 MG tablet Take 1 tablet (10 mg total) by mouth daily. (Patient not taking: Reported on 01/26/2019)  . [DISCONTINUED] clonazePAM (KLONOPIN) 1 MG tablet Take 1 tablet (1 mg total) by mouth 2 (two) times daily as needed for anxiety.   No facility-administered encounter medications on file as of 01/26/2019.     ALLERGIES:  Allergies  Allergen Reactions  . Lithium Other (See Comments)    Psoriasis      PHYSICAL EXAM:  ECOG Performance status: 1  Vitals:   01/26/19 1512  BP: 121/71  Pulse: 86  Resp: 18  Temp: (!) 97.3 F (36.3 C)  SpO2: 96%   Filed Weights   01/26/19 1512  Weight: 211 lb 4.8 oz (95.8 kg)    Physical Exam Vitals signs reviewed.  Constitutional:      Appearance: Normal appearance.  Cardiovascular:     Rate and Rhythm: Normal rate and regular rhythm.     Heart sounds: Normal heart sounds.  Pulmonary:     Effort: Pulmonary effort is normal.     Breath sounds: Normal breath sounds.  Abdominal:     General: There is no distension.     Palpations: Abdomen is soft. There is no mass.  Musculoskeletal:        General: No swelling.  Skin:    General: Skin is warm.  Neurological:     General: No focal deficit present.     Mental  Status: She is alert and oriented to person, place, and time.  Psychiatric:        Mood and Affect: Mood normal.        Behavior: Behavior normal.      LABORATORY DATA:  I have reviewed the labs as listed.  CBC    Component Value Date/Time   WBC 12.3 (H) 01/05/2019 1418   RBC 5.56 (H) 01/05/2019 1418   HGB 19.9 (H) 01/05/2019 1418   HGB 19.3 (H) 12/18/2018 1323   HCT 60.1 (H) 01/05/2019 1418   HCT 54.0 (H) 12/18/2018 1323   PLT 236 01/05/2019 1418   PLT 234 12/18/2018 1323   MCV 108.1 (H) 01/05/2019 1418   MCV 100 (H) 12/18/2018 1323   MCH 35.8 (H) 01/05/2019 1418   MCHC 33.1 01/05/2019 1418   RDW 14.6 01/05/2019 1418   RDW 13.4 12/18/2018 1323   LYMPHSABS 1.8 01/05/2019 1418   LYMPHSABS 1.9 01/15/2018 1500   MONOABS 1.1 (H) 01/05/2019 1418   EOSABS 0.0 01/05/2019 1418   EOSABS 0.2 01/15/2018 1500   BASOSABS 0.1 01/05/2019 1418   BASOSABS 0.1 01/15/2018 1500   CMP Latest Ref Rng & Units 01/05/2019 12/18/2018 09/15/2018  Glucose 70 - 99 mg/dL 82 108(H) 109(H)  BUN 6 - 20 mg/dL 14 13 13   Creatinine 0.44 - 1.00 mg/dL 1.17(H) 1.21(H) 1.07(H)  Sodium 135 - 145 mmol/L 139 141 141  Potassium 3.5 - 5.1 mmol/L 4.5 4.1 4.2  Chloride 98 - 111 mmol/L 101 100 103  CO2 22 - 32 mmol/L 30 26 27   Calcium 8.9 - 10.3 mg/dL 9.8 9.5 8.6(L)  Total Protein 6.5 - 8.1 g/dL 7.3 6.2 -  Total Bilirubin 0.3 - 1.2 mg/dL 0.4 <0.2 -  Alkaline Phos 38 -  126 U/L 122 130(H) -  AST 15 - 41 U/L 21 21 -  ALT 0 - 44 U/L 24 24 -       DIAGNOSTIC IMAGING:  I have independently reviewed the scans and discussed with the patient.      ASSESSMENT & PLAN:   Erythrocytosis 1.  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  She has chronic headaches for little over a year.  They have not changed. -She is current active smoker, half pack per day for 40 years.  She is not on any diuretics.  Denies any steroid use. - Hemoglobin was 19.9 with hematocrit of 60 on 01/05/2019.  Jak 2  and other mutations were negative. -Erythropoietin was 15.9.  White count was minimally high at 12.3 but was normal on the prior visit. - She also has sleep apnea and is not using CPAP machine from the beginning of this year.  She quit using it as she could not get the small size headgear needed for it. - CT renal study on 09/01/2018 shows mild left hydro ureteral nephrosis due to 4 mm mid left ureteral calculus.  Tiny left renal calculus.  Severe hepatic steatosis. - Based on her high hemoglobin and hematocrit, I have recommended one phlebotomy.  We will repeat her labs in 4 weeks. -I have counseled her to start using CPAP machine and quit smoking.  2.  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  ABG shows PO2 of 51 and PCO2 of 52.  Elevated carboxyhemoglobin is out of proportion to her smoking. - She has carbon monoxide alarm at home placed at our request.  She denies any chronic exposure to vehicle exhaust. -She has an appointment to see Dr. Luan Pulling tomorrow.   Total time spent is 25 minutes with more than 50% of the time spent face-to-face discussing lab results, treatment plan, counseling and coordination of care.  Orders placed this encounter:  Orders Placed This Encounter  Procedures  . CBC with Differential      Derek Jack, MD [place] [phone]

## 2019-01-27 DIAGNOSIS — D751 Secondary polycythemia: Secondary | ICD-10-CM | POA: Diagnosis not present

## 2019-01-27 DIAGNOSIS — J9611 Chronic respiratory failure with hypoxia: Secondary | ICD-10-CM | POA: Diagnosis not present

## 2019-01-27 DIAGNOSIS — G4733 Obstructive sleep apnea (adult) (pediatric): Secondary | ICD-10-CM | POA: Diagnosis not present

## 2019-01-27 DIAGNOSIS — J449 Chronic obstructive pulmonary disease, unspecified: Secondary | ICD-10-CM | POA: Diagnosis not present

## 2019-01-28 ENCOUNTER — Other Ambulatory Visit (HOSPITAL_COMMUNITY): Payer: Self-pay | Admitting: Respiratory Therapy

## 2019-01-28 DIAGNOSIS — J441 Chronic obstructive pulmonary disease with (acute) exacerbation: Secondary | ICD-10-CM

## 2019-01-28 NOTE — Progress Notes (Deleted)
[facility] MD/PA/NP OP Progress Note  01/28/2019 1:33 PM Rebecca Moreno  MRN:  [id number]  Chief Complaint:  HPI: *** Visit Diagnosis: No diagnosis found.  Past Psychiatric History: Please see initial evaluation for full details. I have reviewed the history. No updates at this time.     Past Medical History:  Past Medical History:  Diagnosis Date  . Acute pyelonephritis   . Asthma   . Bipolar disorder ([place])   . Colon polyp   . Complication of anesthesia    "sometimes hard to put to sleep"  . Depression    Bipolar  . Gastric ulcer    around 2013.  stress related  . GERD (gastroesophageal reflux disease)   . History of kidney stones   . Hyperlipidemia   . Hypothyroid   . Polyp, uterus corpus   . Sepsis due to Escherichia coli (E. coli) ([place]) 09/04/2018  . Sleep apnea    no cpap used    Past Surgical History:  Procedure Laterality Date  . ABLATION ON ENDOMETRIOSIS    . COLONOSCOPY     Per patient, done around 2013 in [place], had polyp and overdue for follow up.  . COLONOSCOPY WITH PROPOFOL N/A 05/12/2018   Procedure: COLONOSCOPY WITH PROPOFOL;  Surgeon: Daneil Dolin, MD;  [facility];  Service: Endoscopy;  Laterality: N/A;  12:00pm  . CYSTOSCOPY W/ URETERAL STENT PLACEMENT Left 09/01/2018   Procedure: CYSTOSCOPY WITH RETROGRADE PYELOGRAM/URETERAL STENT PLACEMENT;  Surgeon: Ceasar Mons, MD;  [facility];  Service: Urology;  Laterality: Left;  . CYSTOSCOPY/URETEROSCOPY/HOLMIUM LASER/STENT PLACEMENT Left 09/17/2018   Procedure: CYSTOSCOPY/URETEROSCOPY/HOLMIUM LASER/STENT PLACEMENT;  Surgeon: Ceasar Mons, MD;  [facility];  Service: Urology;  Laterality: Left;  ONLY NEEDS 45 MIN  . OVARIAN CYST SURGERY Left   . POLYPECTOMY  05/12/2018   Procedure: POLYPECTOMY;  Surgeon: Daneil Dolin, MD;  [facility];  Service: Endoscopy;;  polyp at splenic flexure  . TEMPOROMANDIBULAR JOINT SURGERY     9 surgeries  . TUBAL LIGATION     . vaginal childbirth     1    Family Psychiatric History: Please see initial evaluation for full details. I have reviewed the history. No updates at this time.     Family History:  Family History  Problem Relation Age of Onset  . Anxiety disorder Mother   . Hypertension Mother   . Heart failure Mother   . Stroke Mother   . Hyperlipidemia Father   . Diabetes Father   . Stroke Father   . Hypertension Father   . Colon polyps Father        older than 14  . Diabetes Sister   . Hypertension Sister   . Ovarian cancer Sister   . Asthma Daughter   . Ovarian cancer Maternal Grandmother   . Colon cancer Maternal Grandmother   . Renal Disease Paternal Grandmother   . Heart attack Paternal Grandfather   . Bipolar disorder Other     Social History:  Social History   Socioeconomic History  . Marital status: Married    Spouse name: Not on file  . Number of children: 1  . Years of education: Not on file  . Highest education level: High school graduate  Occupational History  . Occupation: disability  Social Needs  . Financial resource strain: Not hard at all  . Food insecurity    Worry: Never true    Inability: Never true  . Transportation needs  Medical: No    Non-medical: No  Tobacco Use  . Smoking status: Current Every Day Smoker    Packs/day: 0.50    Years: 30.00    Pack years: 15.00    Types: Cigarettes  . Smokeless tobacco: Never Used  Substance and Sexual Activity  . Alcohol use: Never    Frequency: Never  . Drug use: Not Currently  . Sexual activity: Not Currently    Birth control/protection: Surgical    Comment: tubal  Lifestyle  . Physical activity    Days per week: 0 days    Minutes per session: 0 min  . Stress: To some extent  Relationships  . Social connections    Talks on phone: More than three times a week    Gets together: More than three times a week    Attends religious service: Never    Active member of club or organization: No     Attends meetings of clubs or organizations: Never    Relationship status: Married  Other Topics Concern  . Not on file  Social History Narrative  . Not on file    Allergies:  Allergies  Allergen Reactions  . Lithium Other (See Comments)    Psoriasis     Metabolic Disorder Labs: No results found for: HGBA1C, MPG No results found for: PROLACTIN Lab Results  Component Value Date   CHOL 212 (H) 12/18/2018   TRIG 171 (H) 12/18/2018   HDL 44 12/18/2018   CHOLHDL 4.8 (H) 12/18/2018   LDLCALC 134 (H) 12/18/2018   LDLCALC 168 (H) 01/15/2018   Lab Results  Component Value Date   TSH 1.480 12/18/2018   TSH 1.550 04/16/2018    Therapeutic Level Labs: No results found for: LITHIUM No results found for: VALPROATE No components found for:  CBMZ  Current Medications: Current Outpatient Medications  Medication Sig Dispense Refill  . albuterol (PROVENTIL HFA;VENTOLIN HFA) 108 (90 Base) MCG/ACT inhaler Inhale 1 puff into the lungs every 6 (six) hours as needed for wheezing or shortness of breath.    . baclofen (LIORESAL) 10 MG tablet TAKE 1 TABLET BY MOUTH TWICE A DAY AS NEEDED    MAX  1 2 TIMES/WEEK    . CALCIUM CITRATE-VITAMIN D3 PO Take 1 tablet by mouth daily.    . Cariprazine HCl (VRAYLAR) 6 MG CAPS Take 1 capsule (6 mg total) by mouth daily. 30 capsule 2  . Cholecalciferol (VITAMIN D3) 5000 units TABS Take 5,000 Units by mouth daily.     . clonazePAM (KLONOPIN) 1 MG disintegrating tablet Take 1 tablet (1 mg total) by mouth 2 (two) times daily as needed (anxiety). 60 tablet 0  . clonazePAM (KLONOPIN) 1 MG tablet Take 1 tablet (1 mg total) by mouth 2 (two) times daily. 60 tablet 0  . Dexlansoprazole (DEXILANT) 30 MG capsule Take 30 mg by mouth daily.    . famotidine (PEPCID) 20 MG tablet Take 20 mg by mouth daily.    . fluticasone (FLONASE) 50 MCG/ACT nasal spray Place 2 sprays into both nostrils daily. (Patient not taking: Reported on 01/05/2019) 16 g 6  . fluticasone (FLOVENT  HFA) 220 MCG/ACT inhaler Inhale 1 puff into the lungs daily.     Marland Kitchen lamoTRIgine (LAMICTAL) 100 MG tablet Take 3 tablets (300 mg total) by mouth at bedtime. 90 tablet 0  . levothyroxine (SYNTHROID, LEVOTHROID) 50 MCG tablet Take 50 mcg by mouth daily before breakfast.    . loratadine (CLARITIN) 10 MG tablet Take 1 tablet (10  mg total) by mouth daily. (Patient not taking: Reported on 01/26/2019) 30 tablet 11  . magnesium gluconate (MAGONATE) 500 MG tablet Take 500 mg by mouth daily.    . Multiple Vitamins-Minerals (MULTIVITAMIN WOMEN PO) Take 1 tablet by mouth daily.    . QUEtiapine (SEROQUEL) 300 MG tablet Take 1 tablet (300 mg total) by mouth at bedtime. 90 tablet 2  . venlafaxine XR (EFFEXOR-XR) 150 MG 24 hr capsule Take 2 capsules (300 mg total) by mouth daily. 60 capsule 2  . zonisamide (ZONEGRAN) 25 MG capsule TAKE 4 CAPSULES BY ORAL ROUTE DAILY FOR 30 DAYS     No current facility-administered medications for this visit.      Musculoskeletal: Strength & Muscle Tone: N/A Gait & Station: N/A Patient leans: N/A  Psychiatric Specialty Exam: ROS  There were no vitals taken for this visit.There is no height or weight on file to calculate BMI.  General Appearance: {Appearance:22683}  Eye Contact:  {[facility] EYE CONTACT:22684}  Speech:  Clear and Coherent  Volume:  Normal  Mood:  {B[facility] MOOD:22306}  Affect:  {Affect (PAA):22687}  Thought Process:  Coherent  Orientation:  Full (Time, Place, and Person)  Thought Content: Logical   Suicidal Thoughts:  {ST/HT (PAA):22692}  Homicidal Thoughts:  {ST/HT (PAA):22692}  Memory:  Immediate;   Good  Judgement:  {Judgement (PAA):22694}  Insight:  {Insight (PAA):22695}  Psychomotor Activity:  Normal  Concentration:  Concentration: Good and Attention Span: Good  Recall:  Good  Fund of Knowledge: Good  Language: Good  Akathisia:  No  Handed:  Right  AIMS (if indicated): not done  Assets:  Communication Skills Desire for Improvement  ADL's:  Intact   Cognition: WNL  Sleep:  {[facility] GOOD/FAIR/POOR:22877}   Screenings: GAD-7     Office Visit from 04/16/2018 in [facility]  Total GAD-7 Score  3    Mini-Mental     Clinical Support from 07/22/2018 in [facility]  Total Score (max 30 points )  30    PHQ2-9     Patient Outreach Telephone from 09/10/2018 in [facility] Office Visit from 07/22/2018 in [facility] Visit from 06/20/2018 in [facility] Visit from 05/19/2018 in [facility] Office Visit from 04/16/2018 in [facility]  PHQ-2 Total Score  1  2  2  2  2   PHQ-9 Total Score  -  4  7  6  7        Assessment and Plan:  Rebecca Moreno is a 53 y.o. year old female with a history of bipolar disorder by history, hypothyroidism , who presents for follow up appointment for No diagnosis found.  # Unspecified mood disorder # Bipolar I disorder by history # r/o MDD with psychotic features Patient reports significant improvement in mood symptoms since her last visit.  Psychosocial stressors includes marital conflict, and she has very low self-esteem.  Continue venlafaxine to target depression.  Will continue lamotrigine to target mood dysregulation.  Discussed risk of Stevens-Johnson syndrome.  Will continue Vraylar and quetiapine to target mood dysregulation.  Although it is preferable to do monotherapy, she reportedly had a history of relapse in her symptoms which she tried.  Discussed potential metabolic side effect and EPS.  Will continue clonazepam for anxiety.  Discussed risk of dependence and oversedation.  Noted that although the patient was diagnosed with bipolar disorder in the past, patient and her husband both denies any manic/hypomanic  episode except mild irritability, paranoia in the context of depression.  Will continue to monitor.   Plan  1. Continue Venlafaxine 300 mg daily   2. Continue Lamotrigine 300 mg daily  3. Continue Vraylar 6 mg  4. Continue Quetiapine 300 mg at night  5. Continue Clonazepam 1 mg twice a dayas needed for anxiety 6. Next appointment: 8/24 at 1:40 for 20 mins, phone 7.Return to clinic inthree months for 15 mins 8. Obtain record from your previous psychiatrist Discussed no show policy. She wants to hold therapy session as she is doing better.   Past trials of medication:sertraline, fluoxetine, lexapro,Celexa, duloxetine,Wellbutrinlithium (psoriasis), Depakote(did not work), olanzapine, Abilify(weight gain), latuda, Geodon  The patient demonstrates the following risk factors for suicide: Chronic risk factors for suicide include:psychiatric disorder ofdepression. Acute risk factorsfor suicide include: family or marital conflict and unemployment. Protective factorsfor this patient include: positive social support and hope for the future. Considering these factors, the overall suicide risk at this point appears to below. Patientisappropriate for outpatient follow up.  Norman Clay, MD 01/28/2019, 1:33 PM

## 2019-01-31 ENCOUNTER — Other Ambulatory Visit: Payer: Self-pay | Admitting: Family Medicine

## 2019-01-31 DIAGNOSIS — J301 Allergic rhinitis due to pollen: Secondary | ICD-10-CM

## 2019-02-02 ENCOUNTER — Other Ambulatory Visit (HOSPITAL_COMMUNITY)
Admission: RE | Admit: 2019-02-02 | Discharge: 2019-02-02 | Disposition: A | Discharge status: Home / Self Care | Payer: Medicare Other | Source: Ambulatory Visit | Attending: Pulmonary Disease | Admitting: Pulmonary Disease

## 2019-02-02 ENCOUNTER — Other Ambulatory Visit: Payer: Self-pay

## 2019-02-03 ENCOUNTER — Ambulatory Visit (HOSPITAL_COMMUNITY): Payer: Medicare Other | Admitting: Psychiatry

## 2019-02-03 ENCOUNTER — Other Ambulatory Visit: Payer: Self-pay

## 2019-02-03 ENCOUNTER — Telehealth (HOSPITAL_COMMUNITY): Payer: Self-pay | Admitting: Psychiatry

## 2019-02-03 ENCOUNTER — Other Ambulatory Visit (HOSPITAL_BASED_OUTPATIENT_CLINIC_OR_DEPARTMENT_OTHER): Payer: Self-pay

## 2019-02-03 DIAGNOSIS — R0683 Snoring: Secondary | ICD-10-CM

## 2019-02-03 DIAGNOSIS — G4730 Sleep apnea, unspecified: Secondary | ICD-10-CM

## 2019-02-03 NOTE — Telephone Encounter (Signed)
Called the patient  twice for appointment scheduled today. The patient did not answer the phone. Left voice message to contact the office.  

## 2019-02-04 ENCOUNTER — Inpatient Hospital Stay (HOSPITAL_COMMUNITY): Admission: RE | Admit: 2019-02-04 | Payer: Medicare Other | Source: Ambulatory Visit

## 2019-02-05 ENCOUNTER — Encounter (HOSPITAL_COMMUNITY): Payer: Self-pay

## 2019-02-05 ENCOUNTER — Other Ambulatory Visit: Payer: Self-pay

## 2019-02-05 ENCOUNTER — Inpatient Hospital Stay (HOSPITAL_COMMUNITY): Payer: Medicare Other | Attending: Hematology

## 2019-02-05 VITALS — BP 110/63 | HR 83 | Temp 97.1°F | Resp 20

## 2019-02-05 DIAGNOSIS — Z79899 Other long term (current) drug therapy: Secondary | ICD-10-CM | POA: Insufficient documentation

## 2019-02-05 DIAGNOSIS — D751 Secondary polycythemia: Secondary | ICD-10-CM | POA: Diagnosis not present

## 2019-02-05 NOTE — Progress Notes (Signed)
Phlebotomy started @ 1408. Venipuncture to RT AC x 1 stick. Patient tolerated well. Phlebotomy ended @ 1425. Needle removed. Site WNL. Patient tolerated procedure without any problems. 458ml TPR. Blood clotted in the tubing. Patient discharged ambulatory in stable condition.

## 2019-02-06 ENCOUNTER — Other Ambulatory Visit: Payer: Self-pay

## 2019-02-06 DIAGNOSIS — Z20822 Contact with and (suspected) exposure to covid-19: Secondary | ICD-10-CM

## 2019-02-09 ENCOUNTER — Other Ambulatory Visit (HOSPITAL_COMMUNITY)
Admission: RE | Admit: 2019-02-09 | Discharge: 2019-02-09 | Disposition: A | Discharge status: Home / Self Care | Payer: Medicare Other | Source: Ambulatory Visit | Attending: Neurology | Admitting: Neurology

## 2019-02-09 ENCOUNTER — Other Ambulatory Visit: Payer: Self-pay

## 2019-02-09 DIAGNOSIS — Z01818 Encounter for other preprocedural examination: Secondary | ICD-10-CM | POA: Insufficient documentation

## 2019-02-09 DIAGNOSIS — Z20828 Contact with and (suspected) exposure to other viral communicable diseases: Secondary | ICD-10-CM | POA: Insufficient documentation

## 2019-02-09 LAB — SARS CORONAVIRUS 2 (TAT 6-24 HRS): SARS Coronavirus 2: NEGATIVE

## 2019-02-09 NOTE — Progress Notes (Signed)
Virtual Visit via Telephone Note  I connected with Rebecca Moreno on 02/16/19 at  2:20 PM EDT by telephone and verified that I am speaking with the correct person using two identifiers.   I discussed the limitations, risks, security and privacy concerns of performing an evaluation and management service by telephone and the availability of in person appointments. I also discussed with the patient that there may be a patient responsible charge related to this service. The patient expressed understanding and agreed to proceed.       I discussed the assessment and treatment plan with the patient. The patient was provided an opportunity to ask questions and all were answered. The patient agreed with the plan and demonstrated an understanding of the instructions.   The patient was advised to call back or seek an in-person evaluation if the symptoms worsen or if the condition fails to improve as anticipated.  I provided 15 minutes of non-face-to-face time during this encounter.   Norman Clay, MD    [facility] MD/PA/NP OP Progress Note  02/16/2019 3:01 PM Rebecca Moreno  MRN:  [id number]  Chief Complaint:  Chief Complaint    Follow-up; Other     HPI:  This is a follow-up appointment for mood disorder.  She states that she has been doing better.  She does work out every day with her daughter, which she finds helpful for her mood.  She enjoys going out with her best friend.  She states that she continues to live together with her husband in separation.  Although they are getting along and is a "best friend," she finds it weird about the situation. She denies any safety concern. She also feels stressed living with her mother in law, age 54. Her mother in law will do what she wants to do, although the patient denies any tension between them.  She sleeps well when she takes medication. She denies feeling depressed.  She has fair motivation and energy.  She denies anhedonia.  She denies SI.  She feels  anxious.  She attributes it to having medical appointments.  She denies panic attacks.  She denies decreased need for sleep or euphoria. She has tried to eat healthier, daily exercise, and has lost weight.   200 lbs Wt Readings from Last 3 Encounters:  01/26/19 211 lb 4.8 oz (95.8 kg)  01/05/19 203 lb (92.1 kg)  09/17/18 215 lb (97.5 kg)    Visit Diagnosis:    ICD-10-CM   1. Mood disorder (Alpha)  F39     Past Psychiatric History: Please see initial evaluation for full details. I have reviewed the history. No updates at this time.     Past Medical History:  Past Medical History:  Diagnosis Date  . Acute pyelonephritis   . Asthma   . Bipolar disorder ([place])   . Colon polyp   . Complication of anesthesia    "sometimes hard to put to sleep"  . Depression    Bipolar  . Gastric ulcer    around 2013.  stress related  . GERD (gastroesophageal reflux disease)   . History of kidney stones   . Hyperlipidemia   . Hypothyroid   . Polyp, uterus corpus   . Sepsis due to Escherichia coli (E. coli) ([place]) 09/04/2018  . Sleep apnea    no cpap used    Past Surgical History:  Procedure Laterality Date  . ABLATION ON ENDOMETRIOSIS    . COLONOSCOPY     Per patient, done around  2013 in [place], had polyp and overdue for follow up.  . COLONOSCOPY WITH PROPOFOL N/A 05/12/2018   Procedure: COLONOSCOPY WITH PROPOFOL;  Surgeon: Daneil Dolin, MD;  Location: [facility];  Service: Endoscopy;  Laterality: N/A;  12:00pm  . CYSTOSCOPY W/ URETERAL STENT PLACEMENT Left 09/01/2018   Procedure: CYSTOSCOPY WITH RETROGRADE PYELOGRAM/URETERAL STENT PLACEMENT;  Surgeon: Ceasar Mons, MD;  Location: [facility];  Service: Urology;  Laterality: Left;  . CYSTOSCOPY/URETEROSCOPY/HOLMIUM LASER/STENT PLACEMENT Left 09/17/2018   Procedure: CYSTOSCOPY/URETEROSCOPY/HOLMIUM LASER/STENT PLACEMENT;  Surgeon: Ceasar Mons, MD;  Location: [facility];  Service: Urology;  Laterality: Left;  ONLY NEEDS 45  MIN  . OVARIAN CYST SURGERY Left   . POLYPECTOMY  05/12/2018   Procedure: POLYPECTOMY;  Surgeon: Daneil Dolin, MD;  Location: [facility];  Service: Endoscopy;;  polyp at splenic flexure  . TEMPOROMANDIBULAR JOINT SURGERY     9 surgeries  . TUBAL LIGATION    . vaginal childbirth     1    Family Psychiatric History: Please see initial evaluation for full details. I have reviewed the history. No updates at this time.     Family History:  Family History  Problem Relation Age of Onset  . Anxiety disorder Mother   . Hypertension Mother   . Heart failure Mother   . Stroke Mother   . Hyperlipidemia Father   . Diabetes Father   . Stroke Father   . Hypertension Father   . Colon polyps Father        older than 26  . Diabetes Sister   . Hypertension Sister   . Ovarian cancer Sister   . Asthma Daughter   . Ovarian cancer Maternal Grandmother   . Colon cancer Maternal Grandmother   . Renal Disease Paternal Grandmother   . Heart attack Paternal Grandfather   . Bipolar disorder Other     Social History:  Social History   Socioeconomic History  . Marital status: Married    Spouse name: Not on file  . Number of children: 1  . Years of education: Not on file  . Highest education level: High school graduate  Occupational History  . Occupation: disability  Social Needs  . Financial resource strain: Not hard at all  . Food insecurity    Worry: Never true    Inability: Never true  . Transportation needs    Medical: No    Non-medical: No  Tobacco Use  . Smoking status: Current Every Day Smoker    Packs/day: 0.50    Years: 30.00    Pack years: 15.00    Types: Cigarettes  . Smokeless tobacco: Never Used  Substance and Sexual Activity  . Alcohol use: Never    Frequency: Never  . Drug use: Not Currently  . Sexual activity: Not Currently    Birth control/protection: Surgical    Comment: tubal  Lifestyle  . Physical activity    Days per week: 0 days    Minutes per  session: 0 min  . Stress: To some extent  Relationships  . Social connections    Talks on phone: More than three times a week    Gets together: More than three times a week    Attends religious service: Never    Active member of club or organization: No    Attends meetings of clubs or organizations: Never    Relationship status: Married  Other Topics Concern  . Not on file  Social History Narrative  .  Not on file    Allergies:  Allergies  Allergen Reactions  . Lithium Other (See Comments)    Psoriasis     Metabolic Disorder Labs: No results found for: HGBA1C, MPG No results found for: PROLACTIN Lab Results  Component Value Date   CHOL 212 (H) 12/18/2018   TRIG 171 (H) 12/18/2018   HDL 44 12/18/2018   CHOLHDL 4.8 (H) 12/18/2018   LDLCALC 134 (H) 12/18/2018   LDLCALC 168 (H) 01/15/2018   Lab Results  Component Value Date   TSH 1.480 12/18/2018   TSH 1.550 04/16/2018    Therapeutic Level Labs: No results found for: LITHIUM No results found for: VALPROATE No components found for:  CBMZ  Current Medications: Current Outpatient Medications  Medication Sig Dispense Refill  . albuterol (PROVENTIL HFA;VENTOLIN HFA) 108 (90 Base) MCG/ACT inhaler Inhale 1 puff into the lungs every 6 (six) hours as needed for wheezing or shortness of breath.    . baclofen (LIORESAL) 10 MG tablet TAKE 1 TABLET BY MOUTH TWICE A DAY AS NEEDED    MAX  1 2 TIMES/WEEK    . CALCIUM CITRATE-VITAMIN D3 PO Take 1 tablet by mouth daily.    . Cariprazine HCl (VRAYLAR) 6 MG CAPS Take 1 capsule (6 mg total) by mouth daily. 90 capsule 1  . Cholecalciferol (VITAMIN D3) 5000 units TABS Take 5,000 Units by mouth daily.     . clonazePAM (KLONOPIN) 1 MG disintegrating tablet Take 1 tablet (1 mg total) by mouth 2 (two) times daily as needed (anxiety). 60 tablet 0  . [START ON 02/21/2019] clonazePAM (KLONOPIN) 1 MG tablet Take 1 tablet (1 mg total) by mouth 2 (two) times daily. 60 tablet 3  . Dexlansoprazole  (DEXILANT) 30 MG capsule Take 30 mg by mouth daily.    . famotidine (PEPCID) 20 MG tablet Take 20 mg by mouth daily.    . fluticasone (FLONASE) 50 MCG/ACT nasal spray SPRAY 2 SPRAYS INTO EACH NOSTRIL EVERY DAY 48 mL 0  . fluticasone (FLOVENT HFA) 220 MCG/ACT inhaler Inhale 1 puff into the lungs daily.     Derrill Memo ON 04/14/2019] lamoTRIgine (LAMICTAL) 100 MG tablet Take 3 tablets (300 mg total) by mouth at bedtime. 90 tablet 0  . levothyroxine (SYNTHROID, LEVOTHROID) 50 MCG tablet Take 50 mcg by mouth daily before breakfast.    . loratadine (CLARITIN) 10 MG tablet Take 1 tablet (10 mg total) by mouth daily. 30 tablet 11  . magnesium gluconate (MAGONATE) 500 MG tablet Take 500 mg by mouth daily.    . Multiple Vitamins-Minerals (MULTIVITAMIN WOMEN PO) Take 1 tablet by mouth daily.    . QUEtiapine (SEROQUEL) 300 MG tablet Take 1 tablet (300 mg total) by mouth at bedtime. 90 tablet 1  . venlafaxine XR (EFFEXOR-XR) 150 MG 24 hr capsule Take 2 capsules (300 mg total) by mouth daily. 180 capsule 1  . zonisamide (ZONEGRAN) 25 MG capsule TAKE 4 CAPSULES BY ORAL ROUTE DAILY FOR 30 DAYS     No current facility-administered medications for this visit.      Musculoskeletal: Strength & Muscle Tone: N/A Gait & Station: N/A Patient leans: N/A  Psychiatric Specialty Exam: Review of Systems  Psychiatric/Behavioral: Negative for depression, hallucinations, memory loss, substance abuse and suicidal ideas. The patient is nervous/anxious. The patient does not have insomnia.   All other systems reviewed and are negative.   There were no vitals taken for this visit.There is no height or weight on file to calculate BMI.  General  Appearance: NA  Eye Contact:  NA  Speech:  Clear and Coherent  Volume:  Normal  Mood:  Anxious  Affect:  NA  Thought Process:  Coherent  Orientation:  Full (Time, Place, and Person)  Thought Content: Logical   Suicidal Thoughts:  No  Homicidal Thoughts:  No  Memory:  Immediate;    Good  Judgement:  Good  Insight:  Fair  Psychomotor Activity:  Normal  Concentration:  Concentration: Good and Attention Span: Good  Recall:  Good  Fund of Knowledge: Good  Language: Good  Akathisia:  No  Handed:  Right  AIMS (if indicated): not done  Assets:  Communication Skills Desire for Improvement  ADL's:  Intact  Cognition: WNL  Sleep:  Fair on medication   Screenings: GAD-7     Office Visit from 04/16/2018 in [place]  Total GAD-7 Score  3    Mini-Mental     Clinical Support from 07/22/2018 in [place]  Total Score (max 30 points )  30    PHQ2-9     Patient Outreach Telephone from 09/10/2018 in [place] Visit from 07/22/2018 in [place] Visit from 06/20/2018 in [place] Visit from 05/19/2018 in [place] Visit from 04/16/2018 in [place]  PHQ-2 Total Score  1  2  2  2  2   PHQ-9 Total Score  -  4  7  6  7        Assessment and Plan:  AAZIYAH NEGRON is a 53 y.o. year old female with a history of bipolar disorder by history, hypothyroidism , who presents for follow up appointment for Mood disorder [facility])  # Unspecified mood disorder # Bipolar I disorder by history # r/o MDD with psychotic features She reports overall improvement in her mood symptoms since her last visit.  Psychosocial stressors includes marital conflict with her husband in separation at home, and lives with her mother in law. She also has very low self-esteem, which plays in her mood dysregulation.  Will continue venlafaxine to target depression.  We will continue lamotrigine for mood dysregulation.  Discussed risk of Stevens-Johnson syndrome.  Will continue philandering quetiapine to target mood dysregulation.  Noted that although it is preferable to try monotherapy, she reportedly had a history of relapse in her  mood symptoms when she tried.  Discussed potential metabolic side effect and EPS.  Will continue clonazepam as needed for anxiety.  Discussed risk of dependence and oversedation.  Noted that although the patient was diagnosed with bipolar disorder in the past, both the patient and her husband denies any manic/hypomanic episode except mild irritability, paranoia in the context of depression. Will continue to assess.  Plan I have reviewed and updated plans as below 1. Continue Venlafaxine 300 mg daily  2. Continue Lamotrigine 300 mg daily  3. Continue Vraylar 6 mg  4. Continue Quetiapine 300 mg at night  5. Continue Clonazepam 1 mg twice a dayas needed for anxiety  6. Next appointment: in January. Discussed attendance policy 8. Obtain record from your previous psychiatrist- pending  Past trials of medication:sertraline, fluoxetine, lexapro,Celexa, duloxetine,Wellbutrinlithium (psoriasis), Depakote(did not work), olanzapine, Abilify(weight gain), latuda, Geodon  The patient demonstrates the following risk factors for suicide: Chronic risk factors for suicide include:psychiatric disorder ofdepression. Acute risk factorsfor suicide include: family or marital conflict and unemployment. Protective factorsfor this patient include: positive social support and hope for the  future. Considering these factors, the overall suicide risk at this point appears to below. Patientisappropriate for outpatient follow up.  Norman Clay, MD 02/16/2019, 3:01 PM

## 2019-02-10 ENCOUNTER — Other Ambulatory Visit (HOSPITAL_COMMUNITY): Payer: Self-pay | Admitting: Psychiatry

## 2019-02-11 ENCOUNTER — Other Ambulatory Visit: Payer: Self-pay

## 2019-02-11 ENCOUNTER — Ambulatory Visit (HOSPITAL_COMMUNITY)
Admission: RE | Admit: 2019-02-11 | Discharge: 2019-02-11 | Disposition: A | Discharge status: Home / Self Care | Payer: Medicare Other | Source: Ambulatory Visit | Attending: Family Medicine | Admitting: Family Medicine

## 2019-02-11 DIAGNOSIS — J441 Chronic obstructive pulmonary disease with (acute) exacerbation: Secondary | ICD-10-CM | POA: Insufficient documentation

## 2019-02-11 LAB — PULMONARY FUNCTION TEST
DL/VA % pred: 71 %
DL/VA: 3.02 ml/min/mmHg/L
DLCO unc % pred: 69 %
DLCO unc: 15.41 ml/min/mmHg
FEF 25-75 Post: 1.53 L/sec
FEF 25-75 Pre: 1.94 L/sec
FEF2575-%Change-Post: -21 %
FEF2575-%Pred-Post: 56 %
FEF2575-%Pred-Pre: 71 %
FEV1-%Change-Post: -5 %
FEV1-%Pred-Post: 78 %
FEV1-%Pred-Pre: 83 %
FEV1-Post: 2.29 L
FEV1-Pre: 2.42 L
FEV1FVC-%Change-Post: 0 %
FEV1FVC-%Pred-Pre: 95 %
FEV6-%Change-Post: -5 %
FEV6-%Pred-Post: 84 %
FEV6-%Pred-Pre: 89 %
FEV6-Post: 3.05 L
FEV6-Pre: 3.21 L
FEV6FVC-%Change-Post: 0 %
FEV6FVC-%Pred-Post: 103 %
FEV6FVC-%Pred-Pre: 103 %
FVC-%Change-Post: -4 %
FVC-%Pred-Post: 82 %
FVC-%Pred-Pre: 86 %
FVC-Post: 3.05 L
FVC-Pre: 3.21 L
Post FEV1/FVC ratio: 75 %
Post FEV6/FVC ratio: 100 %
Pre FEV1/FVC ratio: 75 %
Pre FEV6/FVC Ratio: 100 %
RV % pred: 116 %
RV: 2.29 L
TLC % pred: 101 %
TLC: 5.41 L

## 2019-02-11 MED ORDER — ALBUTEROL SULFATE (2.5 MG/3ML) 0.083% IN NEBU
2.5000 mg | INHALATION_SOLUTION | Freq: Once | RESPIRATORY_TRACT | Status: AC
  Administered 2019-02-11: 09:00:00 2.5 mg via RESPIRATORY_TRACT

## 2019-02-12 ENCOUNTER — Emergency Department (HOSPITAL_COMMUNITY)
Admission: EM | Admit: 2019-02-12 | Discharge: 2019-02-12 | Discharge status: Absent without leave | Payer: Medicare Other

## 2019-02-12 ENCOUNTER — Ambulatory Visit: Payer: Medicare Other | Attending: Pulmonary Disease | Admitting: Neurology

## 2019-02-12 DIAGNOSIS — Z7951 Long term (current) use of inhaled steroids: Secondary | ICD-10-CM | POA: Insufficient documentation

## 2019-02-12 DIAGNOSIS — Z79899 Other long term (current) drug therapy: Secondary | ICD-10-CM | POA: Insufficient documentation

## 2019-02-12 DIAGNOSIS — Z79890 Hormone replacement therapy (postmenopausal): Secondary | ICD-10-CM | POA: Insufficient documentation

## 2019-02-12 DIAGNOSIS — G4730 Sleep apnea, unspecified: Secondary | ICD-10-CM

## 2019-02-12 DIAGNOSIS — G4733 Obstructive sleep apnea (adult) (pediatric): Secondary | ICD-10-CM | POA: Insufficient documentation

## 2019-02-12 DIAGNOSIS — R0683 Snoring: Secondary | ICD-10-CM

## 2019-02-14 NOTE — Procedures (Signed)
[place] A. Merlene Laughter, MD     [email or web address]             NOCTURNAL POLYSOMNOGRAPHY   LOCATION: [facility]  Patient Name: Rebecca Moreno, Rebecca Moreno Date: 02/12/2019 Gender: Female D.O.B: [birth year] Age (years): 53 Referring Provider: Sinda Du Height (inches): 66 Interpreting Physician: Phillips Odor MD, ABSM Weight (lbs): 211 RPSGT: Peak, Robert BMI: 34 MRN: [id number] Neck Size: 15.50 CLINICAL INFORMATION Sleep Study Type: NPSG     Indication for sleep study: Snoring     Epworth Sleepiness Score: 5     SLEEP STUDY TECHNIQUE As per the AASM Manual for the Scoring of Sleep and Associated Events v2.3 (April 2016) with a hypopnea requiring 4% desaturations.  The channels recorded and monitored were frontal, central and occipital EEG, electrooculogram (EOG), submentalis EMG (chin), nasal and oral airflow, thoracic and abdominal wall motion, anterior tibialis EMG, snore microphone, electrocardiogram, and pulse oximetry.  MEDICATIONS Medications self-administered by patient taken the night of the study : N/A  Current Outpatient Medications:  .  albuterol (PROVENTIL HFA;VENTOLIN HFA) 108 (90 Base) MCG/ACT inhaler, Inhale 1 puff into the lungs every 6 (six) hours as needed for wheezing or shortness of breath., Disp: , Rfl:  .  baclofen (LIORESAL) 10 MG tablet, TAKE 1 TABLET BY MOUTH TWICE A DAY AS NEEDED    MAX  1 2 TIMES/WEEK, Disp: , Rfl:  .  CALCIUM CITRATE-VITAMIN D3 PO, Take 1 tablet by mouth daily., Disp: , Rfl:  .  Cariprazine HCl (VRAYLAR) 6 MG CAPS, Take 1 capsule (6 mg total) by mouth daily., Disp: 30 capsule, Rfl: 2 .  Cholecalciferol (VITAMIN D3) 5000 units TABS, Take 5,000 Units by mouth daily. , Disp: , Rfl:  .  clonazePAM (KLONOPIN) 1 MG disintegrating tablet, Take 1 tablet (1 mg total) by mouth 2 (two) times daily as needed (anxiety)., Disp: 60 tablet, Rfl: 0 .  clonazePAM (KLONOPIN) 1 MG tablet, Take 1 tablet (1 mg total) by  mouth 2 (two) times daily., Disp: 60 tablet, Rfl: 0 .  Dexlansoprazole (DEXILANT) 30 MG capsule, Take 30 mg by mouth daily., Disp: , Rfl:  .  famotidine (PEPCID) 20 MG tablet, Take 20 mg by mouth daily., Disp: , Rfl:  .  fluticasone (FLONASE) 50 MCG/ACT nasal spray, SPRAY 2 SPRAYS INTO EACH NOSTRIL EVERY DAY, Disp: 48 mL, Rfl: 0 .  fluticasone (FLOVENT HFA) 220 MCG/ACT inhaler, Inhale 1 puff into the lungs daily. , Disp: , Rfl:  .  lamoTRIgine (LAMICTAL) 100 MG tablet, Take 3 tablets (300 mg total) by mouth at bedtime., Disp: 90 tablet, Rfl: 0 .  levothyroxine (SYNTHROID, LEVOTHROID) 50 MCG tablet, Take 50 mcg by mouth daily before breakfast., Disp: , Rfl:  .  loratadine (CLARITIN) 10 MG tablet, Take 1 tablet (10 mg total) by mouth daily., Disp: 30 tablet, Rfl: 11 .  magnesium gluconate (MAGONATE) 500 MG tablet, Take 500 mg by mouth daily., Disp: , Rfl:  .  Multiple Vitamins-Minerals (MULTIVITAMIN WOMEN PO), Take 1 tablet by mouth daily., Disp: , Rfl:  .  QUEtiapine (SEROQUEL) 300 MG tablet, Take 1 tablet (300 mg total) by mouth at bedtime., Disp: 90 tablet, Rfl: 2 .  venlafaxine XR (EFFEXOR-XR) 150 MG 24 hr capsule, Take 2 capsules (300 mg total) by mouth daily., Disp: 60 capsule, Rfl: 2 .  zonisamide (ZONEGRAN) 25 MG capsule, TAKE 4 CAPSULES BY ORAL ROUTE DAILY FOR 30 DAYS, Disp: , Rfl:      SLEEP ARCHITECTURE The study was initiated at  10:25:52 PM and ended at 5:12:51 AM.  Sleep onset time was 20.2 minutes and the sleep efficiency was 78.6%%. The total sleep time was 319.8 minutes.  Stage REM latency was 148.5 minutes.  The patient spent 16.7%% of the night in stage N1 sleep, 67.2%% in stage N2 sleep, 0.0%% in stage N3 and 16.1% in REM.  Alpha intrusion was absent.  Supine sleep was 0.00%.  RESPIRATORY PARAMETERS The overall apnea/hypopnea index (AHI) was 8.3 per hour. There were 1 total apneas, including 1 obstructive, 0 central and 0 mixed apneas. There were 43 hypopneas and 18  RERAs.  The AHI during Stage REM sleep was 15.1 per hour.  AHI while supine was N/A per hour.  The mean oxygen saturation was 81.3%. The minimum SpO2 during sleep was 64.0%.   The total minutes of oxygen saturation below 88% is 319 minutes.  loud snoring was noted during this study.  CARDIAC DATA The 2 lead EKG demonstrated sinus rhythm. The mean heart rate was 82.3 beats per minute. Other EKG findings include: None. LEG MOVEMENT DATA The total PLMS were 0 with a resulting PLMS index of 0.0. Associated arousal with leg movement index was 0.0.  IMPRESSIONS 1. Marked hypoventilation syndrome is documented with this recording with mean oxygen saturation 81% and a nadir of 64 % with 319 minutes oxygenation below 88%.  Nocturnal oxygen 2 L is recommended.   2. Mild obstructive sleep apnea syndrome worse during REM sleep is also documented.  The severity does not require positive pressure treatment.   3. Absent slow wave sleep is also noted.  Delano Metz, MD Diplomate, American Board of Sleep Medicine.  ELECTRONICALLY SIGNED ON:  02/14/2019, 5:20 PM [place] PH: [phone]   FX: [phone] [place]

## 2019-02-16 ENCOUNTER — Ambulatory Visit (INDEPENDENT_AMBULATORY_CARE_PROVIDER_SITE_OTHER): Payer: Medicare Other | Admitting: Psychiatry

## 2019-02-16 ENCOUNTER — Other Ambulatory Visit: Payer: Self-pay

## 2019-02-16 ENCOUNTER — Encounter (HOSPITAL_COMMUNITY): Payer: Self-pay | Admitting: Psychiatry

## 2019-02-16 DIAGNOSIS — F39 Unspecified mood [affective] disorder: Secondary | ICD-10-CM | POA: Diagnosis not present

## 2019-02-16 MED ORDER — VRAYLAR 6 MG PO CAPS: 1.0000 | ORAL_CAPSULE | Freq: Every day | ORAL | 1 refills | Status: DC

## 2019-02-16 MED ORDER — QUETIAPINE FUMARATE 300 MG PO TABS: 300.0000 mg | ORAL_TABLET | Freq: Every day | ORAL | 1 refills | Status: DC

## 2019-02-16 MED ORDER — VENLAFAXINE HCL ER 150 MG PO CP24: 300.0000 mg | ORAL_CAPSULE | Freq: Every day | ORAL | 1 refills | Status: DC

## 2019-02-16 MED ORDER — CLONAZEPAM 1 MG PO TABS: 1.0000 mg | ORAL_TABLET | Freq: Two times a day (BID) | ORAL | 3 refills | Status: DC

## 2019-02-16 MED ORDER — LAMOTRIGINE 100 MG PO TABS: 300.0000 mg | ORAL_TABLET | Freq: Every day | ORAL | 0 refills | Status: DC

## 2019-02-16 NOTE — Patient Instructions (Signed)
1. Continue Venlafaxine 300 mg daily  2. Continue Lamotrigine 300 mg daily  3. Continue Vraylar 6 mg  4. Continue Quetiapine 300 mg at night  5. Continue Clonazepam 1 mg twice a dayas needed for anxiety  6. Next appointment: in January

## 2019-02-17 ENCOUNTER — Other Ambulatory Visit (HOSPITAL_COMMUNITY): Payer: Self-pay | Admitting: Psychiatry

## 2019-02-17 MED ORDER — LAMOTRIGINE 100 MG PO TABS: 300.0000 mg | ORAL_TABLET | Freq: Every day | ORAL | 1 refills | Status: DC

## 2019-02-25 ENCOUNTER — Other Ambulatory Visit: Payer: Self-pay

## 2019-02-26 ENCOUNTER — Inpatient Hospital Stay (HOSPITAL_COMMUNITY): Payer: Medicare Other

## 2019-02-26 ENCOUNTER — Inpatient Hospital Stay (HOSPITAL_BASED_OUTPATIENT_CLINIC_OR_DEPARTMENT_OTHER): Payer: Medicare Other | Admitting: Hematology

## 2019-02-26 ENCOUNTER — Other Ambulatory Visit (HOSPITAL_COMMUNITY): Payer: Medicare Other

## 2019-02-26 ENCOUNTER — Encounter (HOSPITAL_COMMUNITY): Payer: Self-pay | Admitting: Hematology

## 2019-02-26 VITALS — BP 129/74 | HR 87 | Temp 97.8°F | Resp 16 | Wt 206.4 lb

## 2019-02-26 DIAGNOSIS — D751 Secondary polycythemia: Secondary | ICD-10-CM | POA: Diagnosis not present

## 2019-02-26 DIAGNOSIS — Z79899 Other long term (current) drug therapy: Secondary | ICD-10-CM | POA: Diagnosis not present

## 2019-02-26 LAB — CBC WITH DIFFERENTIAL/PLATELET
Abs Immature Granulocytes: 0.09 10*3/uL — ABNORMAL HIGH (ref 0.00–0.07)
Basophils Absolute: 0.1 10*3/uL (ref 0.0–0.1)
Basophils Relative: 1 %
Eosinophils Absolute: 0 10*3/uL (ref 0.0–0.5)
Eosinophils Relative: 0 %
HCT: 54.4 % — ABNORMAL HIGH (ref 36.0–46.0)
Hemoglobin: 17.8 g/dL — ABNORMAL HIGH (ref 12.0–15.0)
Immature Granulocytes: 1 %
Lymphocytes Relative: 25 %
Lymphs Abs: 2.1 10*3/uL (ref 0.7–4.0)
MCH: 35.8 pg — ABNORMAL HIGH (ref 26.0–34.0)
MCHC: 32.7 g/dL (ref 30.0–36.0)
MCV: 109.5 fL — ABNORMAL HIGH (ref 80.0–100.0)
Monocytes Absolute: 0.6 10*3/uL (ref 0.1–1.0)
Monocytes Relative: 7 %
Neutro Abs: 5.5 10*3/uL (ref 1.7–7.7)
Neutrophils Relative %: 66 %
Platelets: 210 10*3/uL (ref 150–400)
RBC: 4.97 MIL/uL (ref 3.87–5.11)
RDW: 14.6 % (ref 11.5–15.5)
WBC: 8.3 10*3/uL (ref 4.0–10.5)
nRBC: 0 % (ref 0.0–0.2)

## 2019-02-26 NOTE — Progress Notes (Signed)
[address] [place], [place] [address]   CLINIC:  Medical Oncology/Hematology  PCP:  [name], DO [place] [address] [phone]   REASON FOR VISIT:  Follow-up for erythrocytosis and carboxyhemoglobinemia.  CURRENT THERAPY: Phlebotomy.   INTERVAL HISTORY:  Ms. Santoso 53 y.o. female was seen for follow-up of secondary erythrocytosis.  He underwent phlebotomy on 02/05/2019 with 450 mL of blood removed.  Denies any dizziness.  Chronic headaches stable.  Appetite is 100%.  Energy levels are 25%.  No chest pains lightheadedness reported.  No fevers or infections.    REVIEW OF SYSTEMS:  Review of Systems  Constitutional: Positive for fatigue.  Neurological: Positive for headaches.  All other systems reviewed and are negative.    PAST MEDICAL/SURGICAL HISTORY:  Past Medical History:  Diagnosis Date  . Acute pyelonephritis   . Asthma   . Bipolar disorder ([place])   . Colon polyp   . Complication of anesthesia    "sometimes hard to put to sleep"  . Depression    Bipolar  . Gastric ulcer    around 2013.  stress related  . GERD (gastroesophageal reflux disease)   . History of kidney stones   . Hyperlipidemia   . Hypothyroid   . Polyp, uterus corpus   . Sepsis due to Escherichia coli (E. coli) ([place]) 09/04/2018  . Sleep apnea    no cpap used   Past Surgical History:  Procedure Laterality Date  . ABLATION ON ENDOMETRIOSIS    . COLONOSCOPY     Per patient, done around 2013 in [place], had polyp and overdue for follow up.  . COLONOSCOPY WITH PROPOFOL N/A 05/12/2018   Procedure: COLONOSCOPY WITH PROPOFOL;  Surgeon: Daneil Dolin, MD;  Location: [facility];  Service: Endoscopy;  Laterality: N/A;  12:00pm  . CYSTOSCOPY W/ URETERAL STENT PLACEMENT Left 09/01/2018   Procedure: CYSTOSCOPY WITH RETROGRADE PYELOGRAM/URETERAL STENT PLACEMENT;  Surgeon: Ceasar Mons, MD;  Location: [facility];  Service: Urology;   Laterality: Left;  . CYSTOSCOPY/URETEROSCOPY/HOLMIUM LASER/STENT PLACEMENT Left 09/17/2018   Procedure: CYSTOSCOPY/URETEROSCOPY/HOLMIUM LASER/STENT PLACEMENT;  Surgeon: Ceasar Mons, MD;  Location: [facility];  Service: Urology;  Laterality: Left;  ONLY NEEDS 45 MIN  . OVARIAN CYST SURGERY Left   . POLYPECTOMY  05/12/2018   Procedure: POLYPECTOMY;  Surgeon: Daneil Dolin, MD;  Location: [facility];  Service: Endoscopy;;  polyp at splenic flexure  . TEMPOROMANDIBULAR JOINT SURGERY     9 surgeries  . TUBAL LIGATION    . vaginal childbirth     1     SOCIAL HISTORY:  Social History   Socioeconomic History  . Marital status: Married    Spouse name: Not on file  . Number of children: 1  . Years of education: Not on file  . Highest education level: High school graduate  Occupational History  . Occupation: disability  Social Needs  . Financial resource strain: Not hard at all  . Food insecurity    Worry: Never true    Inability: Never true  . Transportation needs    Medical: No    Non-medical: No  Tobacco Use  . Smoking status: Current Every Day Smoker    Packs/day: 0.50    Years: 30.00    Pack years: 15.00    Types: Cigarettes  . Smokeless tobacco: Never Used  Substance and Sexual Activity  . Alcohol use: Never    Frequency: Never  . Drug  use: Not Currently  . Sexual activity: Not Currently    Birth control/protection: Surgical    Comment: tubal  Lifestyle  . Physical activity    Days per week: 0 days    Minutes per session: 0 min  . Stress: To some extent  Relationships  . Social connections    Talks on phone: More than three times a week    Gets together: More than three times a week    Attends religious service: Never    Active member of club or organization: No    Attends meetings of clubs or organizations: Never    Relationship status: Married  . Intimate partner violence    Fear of current or ex partner: No    Emotionally abused: No     Physically abused: No    Forced sexual activity: No  Other Topics Concern  . Not on file  Social History Narrative  . Not on file    FAMILY HISTORY:  Family History  Problem Relation Age of Onset  . Anxiety disorder Mother   . Hypertension Mother   . Heart failure Mother   . Stroke Mother   . Hyperlipidemia Father   . Diabetes Father   . Stroke Father   . Hypertension Father   . Colon polyps Father        older than 81  . Diabetes Sister   . Hypertension Sister   . Ovarian cancer Sister   . Asthma Daughter   . Ovarian cancer Maternal Grandmother   . Colon cancer Maternal Grandmother   . Renal Disease Paternal Grandmother   . Heart attack Paternal Grandfather   . Bipolar disorder Other     CURRENT MEDICATIONS:  Outpatient Encounter Medications as of 02/26/2019  Medication Sig  . albuterol (PROVENTIL HFA;VENTOLIN HFA) 108 (90 Base) MCG/ACT inhaler Inhale 1 puff into the lungs every 6 (six) hours as needed for wheezing or shortness of breath.  . baclofen (LIORESAL) 10 MG tablet TAKE 1 TABLET BY MOUTH TWICE A DAY AS NEEDED    MAX  1 2 TIMES/WEEK  . CALCIUM CITRATE-VITAMIN D3 PO Take 1 tablet by mouth daily.  . Cariprazine HCl (VRAYLAR) 6 MG CAPS Take 1 capsule (6 mg total) by mouth daily.  . Cholecalciferol (VITAMIN D3) 5000 units TABS Take 5,000 Units by mouth daily.   . clonazePAM (KLONOPIN) 1 MG tablet Take 1 tablet (1 mg total) by mouth 2 (two) times daily.  Marland Kitchen Dexlansoprazole (DEXILANT) 30 MG capsule Take 30 mg by mouth daily.  . famotidine (PEPCID) 20 MG tablet Take 20 mg by mouth daily.  . fluticasone (FLONASE) 50 MCG/ACT nasal spray SPRAY 2 SPRAYS INTO EACH NOSTRIL EVERY DAY  . fluticasone (FLOVENT HFA) 220 MCG/ACT inhaler Inhale 1 puff into the lungs daily.   Derrill Memo ON 04/14/2019] lamoTRIgine (LAMICTAL) 100 MG tablet Take 3 tablets (300 mg total) by mouth at bedtime.  Marland Kitchen levothyroxine (SYNTHROID, LEVOTHROID) 50 MCG tablet Take 50 mcg by mouth daily before breakfast.   . loratadine (CLARITIN) 10 MG tablet Take 1 tablet (10 mg total) by mouth daily.  . magnesium gluconate (MAGONATE) 500 MG tablet Take 500 mg by mouth daily.  . Multiple Vitamins-Minerals (MULTIVITAMIN WOMEN PO) Take 1 tablet by mouth daily.  . QUEtiapine (SEROQUEL) 300 MG tablet Take 1 tablet (300 mg total) by mouth at bedtime.  Marland Kitchen venlafaxine XR (EFFEXOR-XR) 150 MG 24 hr capsule Take 2 capsules (300 mg total) by mouth daily.  Marland Kitchen zonisamide (ZONEGRAN) 25  MG capsule TAKE 4 CAPSULES BY ORAL ROUTE DAILY FOR 30 DAYS  . [DISCONTINUED] clonazePAM (KLONOPIN) 1 MG disintegrating tablet Take 1 tablet (1 mg total) by mouth 2 (two) times daily as needed (anxiety). (Patient not taking: Reported on 02/26/2019)  . [DISCONTINUED] QUEtiapine (SEROQUEL) 50 MG tablet SMARTSIG:0.5 Tablet(s) By Mouth PRN   No facility-administered encounter medications on file as of 02/26/2019.     ALLERGIES:  Allergies  Allergen Reactions  . Lithium Other (See Comments)    Psoriasis      PHYSICAL EXAM:  ECOG Performance status: 1  Vitals:   02/26/19 1534  BP: 129/74  Pulse: 87  Resp: 16  Temp: 97.8 F (36.6 C)  SpO2: 94%   Filed Weights   02/26/19 1534  Weight: 206 lb 6.4 oz (93.6 kg)    Physical Exam Vitals signs reviewed.  Constitutional:      Appearance: Normal appearance.  Cardiovascular:     Rate and Rhythm: Normal rate and regular rhythm.     Heart sounds: Normal heart sounds.  Pulmonary:     Effort: Pulmonary effort is normal.     Breath sounds: Normal breath sounds.  Abdominal:     General: There is no distension.     Palpations: Abdomen is soft. There is no mass.  Musculoskeletal:        General: No swelling.  Skin:    General: Skin is warm.  Neurological:     General: No focal deficit present.     Mental Status: She is alert and oriented to person, place, and time.  Psychiatric:        Mood and Affect: Mood normal.        Behavior: Behavior normal.      LABORATORY DATA:  I have  reviewed the labs as listed.  CBC    Component Value Date/Time   WBC 8.3 02/26/2019 1517   RBC 4.97 02/26/2019 1517   HGB 17.8 (H) 02/26/2019 1517   HGB 19.3 (H) 12/18/2018 1323   HCT 54.4 (H) 02/26/2019 1517   HCT 54.0 (H) 12/18/2018 1323   PLT 210 02/26/2019 1517   PLT 234 12/18/2018 1323   MCV 109.5 (H) 02/26/2019 1517   MCV 100 (H) 12/18/2018 1323   MCH 35.8 (H) 02/26/2019 1517   MCHC 32.7 02/26/2019 1517   RDW 14.6 02/26/2019 1517   RDW 13.4 12/18/2018 1323   LYMPHSABS 2.1 02/26/2019 1517   LYMPHSABS 1.9 01/15/2018 1500   MONOABS 0.6 02/26/2019 1517   EOSABS 0.0 02/26/2019 1517   EOSABS 0.2 01/15/2018 1500   BASOSABS 0.1 02/26/2019 1517   BASOSABS 0.1 01/15/2018 1500   CMP Latest Ref Rng & Units 01/05/2019 12/18/2018 09/15/2018  Glucose 70 - 99 mg/dL 82 108(H) 109(H)  BUN 6 - 20 mg/dL 14 13 13   Creatinine 0.44 - 1.00 mg/dL 1.17(H) 1.21(H) 1.07(H)  Sodium 135 - 145 mmol/L 139 141 141  Potassium 3.5 - 5.1 mmol/L 4.5 4.1 4.2  Chloride 98 - 111 mmol/L 101 100 103  CO2 22 - 32 mmol/L 30 26 27   Calcium 8.9 - 10.3 mg/dL 9.8 9.5 8.6(L)  Total Protein 6.5 - 8.1 g/dL 7.3 6.2 -  Total Bilirubin 0.3 - 1.2 mg/dL 0.4 <0.2 -  Alkaline Phos 38 - 126 U/L 122 130(H) -  AST 15 - 41 U/L 21 21 -  ALT 0 - 44 U/L 24 24 -       DIAGNOSTIC IMAGING:  I have independently reviewed the scans and discussed with  the patient.      ASSESSMENT & PLAN:   Erythrocytosis 1.  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  She has chronic headaches for little over a year.  They have not changed. -She is current active smoker, half pack per day for 40 years.  She is not on any diuretics.  Denies any steroid use. - Hemoglobin was 19.9 with hematocrit of 60 on 01/05/2019.  Jak 2 and other mutations were negative. -Erythropoietin was 15.9.  White count was minimally high at 12.3 but was normal on the prior visit. - She also has sleep apnea and is not using CPAP machine from  the beginning of this year.  She quit using it as she could not get the small size headgear needed for it. - CT renal study on 09/01/2018 shows mild left hydro ureteral nephrosis due to 4 mm mid left ureteral calculus.  Tiny left renal calculus.  Severe hepatic steatosis. -She underwent phlebotomy on 02/05/2019 with 450 mL of blood removed. -Today her hematocrit improved to 54.  Hemoglobin 17.8. -I have recommended her to use CPAP machine.  We will see her back in 3 months with repeat labs.  2.  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  ABG shows PO2 of 51 and PCO2 of 52.  Elevated carboxyhemoglobin is out of proportion to her smoking. - She has carbon monoxide alarm at home placed at our request.  She denies any chronic exposure to vehicle exhaust. -She saw Dr. Luan Pulling and is being worked up.    Orders placed this encounter:  Orders Placed This Encounter  Procedures  . CBC with Differential  . Lactate dehydrogenase      Derek Jack, MD [place] [phone]

## 2019-02-26 NOTE — Patient Instructions (Addendum)
[facility] at [facility] Discharge Instructions  You were seen today by Dr. Katragadda. He went over your recent lab results. He will see you back in 3 months for labs and follow up.   Thank you for choosing [facility] at [facility] to provide your oncology and hematology care.  To afford each patient quality time with our provider, please arrive at least 15 minutes before your scheduled appointment time.   If you have a lab appointment with the Cancer Center please come in thru the  Main Entrance and check in at the main information desk  You need to re-schedule your appointment should you arrive 10 or more minutes late.  We strive to give you quality time with our providers, and arriving late affects you and other patients whose appointments are after yours.  Also, if you no show three or more times for appointments you may be dismissed from the clinic at the providers discretion.     Again, thank you for choosing [facility].  Our hope is that these requests will decrease the amount of time that you wait before being seen by our physicians.       _____________________________________________________________  Should you have questions after your visit to [facility], please contact our office at [phone] between the hours of 8:00 a.m. and 4:30 p.m.  Voicemails left after 4:00 p.m. will not be returned until the following business day.  For prescription refill requests, have your pharmacy contact our office and allow 72 hours.    Cancer Center Support Programs:   > Cancer Support Group  2nd Tuesday of the month 1pm-2pm, Journey Room    

## 2019-03-02 DIAGNOSIS — D751 Secondary polycythemia: Secondary | ICD-10-CM | POA: Diagnosis not present

## 2019-03-02 DIAGNOSIS — J9611 Chronic respiratory failure with hypoxia: Secondary | ICD-10-CM | POA: Diagnosis not present

## 2019-03-04 ENCOUNTER — Telehealth (HOSPITAL_COMMUNITY): Payer: Self-pay | Admitting: *Deleted

## 2019-03-06 ENCOUNTER — Encounter (HOSPITAL_COMMUNITY): Payer: Self-pay | Admitting: Hematology

## 2019-03-06 NOTE — Assessment & Plan Note (Signed)
1.  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  She has chronic headaches for little over a year.  They have not changed. -She is current active smoker, half pack per day for 40 years.  She is not on any diuretics.  Denies any steroid use. - Hemoglobin was 19.9 with hematocrit of 60 on 01/05/2019.  Jak 2 and other mutations were negative. -Erythropoietin was 15.9.  White count was minimally high at 12.3 but was normal on the prior visit. - She also has sleep apnea and is not using CPAP machine from the beginning of this year.  She quit using it as she could not get the small size headgear needed for it. - CT renal study on 09/01/2018 shows mild left hydro ureteral nephrosis due to 4 mm mid left ureteral calculus.  Tiny left renal calculus.  Severe hepatic steatosis. -She underwent phlebotomy on 02/05/2019 with 450 mL of blood removed. -Today her hematocrit improved to 54.  Hemoglobin 17.8. -I have recommended her to use CPAP machine.  We will see her back in 3 months with repeat labs.  2.  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  ABG shows PO2 of 51 and PCO2 of 52.  Elevated carboxyhemoglobin is out of proportion to her smoking. - She has carbon monoxide alarm at home placed at our request.  She denies any chronic exposure to vehicle exhaust. -She saw Dr. Luan Pulling and is being worked up.

## 2019-03-31 ENCOUNTER — Other Ambulatory Visit: Payer: Self-pay

## 2019-03-31 DIAGNOSIS — Z20822 Contact with and (suspected) exposure to covid-19: Secondary | ICD-10-CM

## 2019-04-01 LAB — NOVEL CORONAVIRUS, NAA: SARS-CoV-2, NAA: NOT DETECTED

## 2019-04-07 ENCOUNTER — Other Ambulatory Visit (HOSPITAL_COMMUNITY): Payer: Self-pay | Admitting: Psychiatry

## 2019-04-07 ENCOUNTER — Other Ambulatory Visit: Payer: Self-pay

## 2019-04-07 DIAGNOSIS — Z20822 Contact with and (suspected) exposure to covid-19: Secondary | ICD-10-CM

## 2019-04-07 MED ORDER — LAMOTRIGINE 100 MG PO TABS: 300.0000 mg | ORAL_TABLET | Freq: Every day | ORAL | 1 refills | Status: DC

## 2019-04-09 ENCOUNTER — Telehealth: Payer: Self-pay | Admitting: Family Medicine

## 2019-04-09 LAB — NOVEL CORONAVIRUS, NAA: SARS-CoV-2, NAA: NOT DETECTED

## 2019-04-09 NOTE — Chronic Care Management (AMB) (Signed)
°  Chronic Care Management   Outreach Note  04/09/2019 Name: Rebecca Moreno MRN: [id number] DOB: 02-Nov-1965  Referred by: [name], DO Reason for referral : Chronic Care Management (Initial CCM outreach was unsuccessful )   An unsuccessful telephone outreach was attempted today. The patient was referred to the case management team by for assistance with care management and care coordination.   Follow Up Plan: A HIPPA compliant phone message was left for the patient providing contact information and requesting a return call.  The care management team will reach out to the patient again over the next 7 days.  If patient returns call to provider office, please advise to call [facility] [place], [place] Management  [place], [place] [address] Direct Dial: [email or web address]  Website: [email or web address]

## 2019-04-09 NOTE — Telephone Encounter (Signed)
Patient is calling to receive her negative COVID test results. Patient expressed understanding. 

## 2019-04-13 NOTE — Chronic Care Management (AMB) (Signed)
Chronic Care Management   Note  04/13/2019 Name: Rebecca Moreno MRN: [id number] DOB: 1965/06/18  Rebecca Moreno is a 53 y.o. year old female who is a primary care patient of [name], DO. I reached out to [name] by phone today in response to a referral sent by Ms. Andrya H Hougland's health plan.     Ms. Creer was given information about Chronic Care Management services today including:  1. CCM service includes personalized support from designated clinical staff supervised by her physician, including individualized plan of care and coordination with other care providers 2. 24/7 contact phone numbers for assistance for urgent and routine care needs. 3. Service will only be billed when office clinical staff spend 20 minutes or more in a month to coordinate care. 4. Only one practitioner may furnish and bill the service in a calendar month. 5. The patient may stop CCM services at any time (effective at the end of the month) by phone call to the office staff. 6. The patient will be responsible for cost sharing (co-pay) of up to 20% of the service fee (after annual deductible is met).  Patient did not agree to enrollment in care management services and does not wish to consider at this time.  Follow up plan: The patient has been provided with contact information for the chronic care management team and has been advised to call with any health related questions or concerns.   [place], [place] [address] Direct Dial: [email or web address]  Website: [email or web address]

## 2019-04-20 ENCOUNTER — Ambulatory Visit (INDEPENDENT_AMBULATORY_CARE_PROVIDER_SITE_OTHER): Payer: Medicare Other | Admitting: Family Medicine

## 2019-04-20 DIAGNOSIS — Z720 Tobacco use: Secondary | ICD-10-CM

## 2019-04-20 MED ORDER — VARENICLINE TARTRATE 1 MG PO TABS: 1.0000 mg | ORAL_TABLET | Freq: Two times a day (BID) | ORAL | 2 refills | Status: DC

## 2019-04-20 MED ORDER — CHANTIX STARTING MONTH PAK 0.5 MG X 11 & 1 MG X 42 PO TABS: ORAL_TABLET | ORAL | 0 refills | Status: DC

## 2019-04-20 NOTE — Patient Instructions (Signed)
Coping with Quitting Smoking  Quitting smoking is a physical and mental challenge. You will face cravings, withdrawal symptoms, and temptation. Before quitting, work with your health care provider to make a plan that can help you cope. Preparation can help you quit and keep you from giving in. How can I cope with cravings? Cravings usually last for 5-10 minutes. If you get through it, the craving will pass. Consider taking the following actions to help you cope with cravings:  Keep your mouth busy: ? Chew sugar-free gum. ? Suck on hard candies or a straw. ? Brush your teeth.  Keep your hands and body busy: ? Immediately change to a different activity when you feel a craving. ? Squeeze or play with a ball. ? Do an activity or a hobby, like making bead jewelry, practicing needlepoint, or working with wood. ? Mix up your normal routine. ? Take a short exercise break. Go for a quick walk or run up and down stairs. ? Spend time in public places where smoking is not allowed.  Focus on doing something kind or helpful for someone else.  Call a friend or family member to talk during a craving.  Join a support group.  Call a quit line, such as 1-800-QUIT-NOW.  Talk with your health care provider about medicines that might help you cope with cravings and make quitting easier for you. How can I deal with withdrawal symptoms? Your body may experience negative effects as it tries to get used to not having nicotine in the system. These effects are called withdrawal symptoms. They may include:  Feeling hungrier than normal.  Trouble concentrating.  Irritability.  Trouble sleeping.  Feeling depressed.  Restlessness and agitation.  Craving a cigarette. To manage withdrawal symptoms:  Avoid places, people, and activities that trigger your cravings.  Remember why you want to quit.  Get plenty of sleep.  Avoid coffee and other caffeinated drinks. These may worsen some of your symptoms.  How can I handle social situations? Social situations can be difficult when you are quitting smoking, especially in the first few weeks. To manage this, you can:  Avoid parties, bars, and other social situations where people might be smoking.  Avoid alcohol.  Leave right away if you have the urge to smoke.  Explain to your family and friends that you are quitting smoking. Ask for understanding and support.  Plan activities with friends or family where smoking is not an option. What are some ways I can cope with stress? Wanting to smoke may cause stress, and stress can make you want to smoke. Find ways to manage your stress. Relaxation techniques can help. For example:  Breathe slowly and deeply, in through your nose and out through your mouth.  Listen to soothing, relaxing music.  Talk with a family member or friend about your stress.  Light a candle.  Soak in a bath or take a shower.  Think about a peaceful place. What are some ways I can prevent weight gain? Be aware that many people gain weight after they quit smoking. However, not everyone does. To keep from gaining weight, have a plan in place before you quit and stick to the plan after you quit. Your plan should include:  Having healthy snacks. When you have a craving, it may help to: ? Eat plain popcorn, crunchy carrots, celery, or other cut vegetables. ? Chew sugar-free gum.  Changing how you eat: ? Eat small portion sizes at meals. ? Eat 4-6 small meals   throughout the day instead of 1-2 large meals a day. ? Be mindful when you eat. Do not watch television or do other things that might distract you as you eat.  Exercising regularly: ? Make time to exercise each day. If you do not have time for a long workout, do short bouts of exercise for 5-10 minutes several times a day. ? Do some form of strengthening exercise, like weight lifting, and some form of aerobic exercise, like running or swimming.  Drinking plenty of  water or other low-calorie or no-calorie drinks. Drink 6-8 glasses of water daily, or as much as instructed by your health care provider. Summary  Quitting smoking is a physical and mental challenge. You will face cravings, withdrawal symptoms, and temptation to smoke again. Preparation can help you as you go through these challenges.  You can cope with cravings by keeping your mouth busy (such as by chewing gum), keeping your body and hands busy, and making calls to family, friends, or a helpline for people who want to quit smoking.  You can cope with withdrawal symptoms by avoiding places where people smoke, avoiding drinks with caffeine, and getting plenty of rest.  Ask your health care provider about the different ways to prevent weight gain, avoid stress, and handle social situations. This information is not intended to replace advice given to you by your health care provider. Make sure you discuss any questions you have with your health care provider. Document Released: 04/20/2016 Document Revised: 04/05/2017 Document Reviewed: 04/20/2016 Elsevier Patient Education  2020 [employer]  

## 2019-04-20 NOTE — Progress Notes (Signed)
Telephone visit  Subjective: CC: Smoking cessation PCP: [name], DO [id number] Rebecca Moreno is a 53 y.o. female calls for telephone consult today. Patient provides verbal consent for consult held via phone.  Location of patient: home Location of provider: Working remotely from home Others present for call: none  1. Tobacco use Patient reports that she is smoking > 1/2 pack per day.  She has been a smoker since she was 40.  She had quit for 5 years but started back 1 year ago.  She was successfully quit with Chantix previously and would like to go back on this.  She reports compliance with her inhalers and denies any shortness of breath or wheeze.  No unplanned weight loss or hemoptysis.   ROS: Per HPI  Allergies  Allergen Reactions  . Lithium Other (See Comments)    Psoriasis    Past Medical History:  Diagnosis Date  . Acute pyelonephritis   . Asthma   . Bipolar disorder ([place])   . Colon polyp   . Complication of anesthesia    "sometimes hard to put to sleep"  . Depression    Bipolar  . Gastric ulcer    around 2013.  stress related  . GERD (gastroesophageal reflux disease)   . History of kidney stones   . Hyperlipidemia   . Hypothyroid   . Polyp, uterus corpus   . Sepsis due to Escherichia coli (E. coli) ([place]) 09/04/2018  . Sleep apnea    no cpap used    Current Outpatient Medications:  .  albuterol (PROVENTIL HFA;VENTOLIN HFA) 108 (90 Base) MCG/ACT inhaler, Inhale 1 puff into the lungs every 6 (six) hours as needed for wheezing or shortness of breath., Disp: , Rfl:  .  baclofen (LIORESAL) 10 MG tablet, TAKE 1 TABLET BY MOUTH TWICE A DAY AS NEEDED    MAX  1 2 TIMES/WEEK, Disp: , Rfl:  .  CALCIUM CITRATE-VITAMIN D3 PO, Take 1 tablet by mouth daily., Disp: , Rfl:  .  Cariprazine HCl (VRAYLAR) 6 MG CAPS, Take 1 capsule (6 mg total) by mouth daily., Disp: 90 capsule, Rfl: 1 .  Cholecalciferol (VITAMIN D3) 5000 units TABS, Take 5,000 Units by mouth daily. , Disp: ,  Rfl:  .  clonazePAM (KLONOPIN) 1 MG tablet, Take 1 tablet (1 mg total) by mouth 2 (two) times daily., Disp: 60 tablet, Rfl: 3 .  Dexlansoprazole (DEXILANT) 30 MG capsule, Take 30 mg by mouth daily., Disp: , Rfl:  .  famotidine (PEPCID) 20 MG tablet, Take 20 mg by mouth daily., Disp: , Rfl:  .  fluticasone (FLONASE) 50 MCG/ACT nasal spray, SPRAY 2 SPRAYS INTO EACH NOSTRIL EVERY DAY, Disp: 48 mL, Rfl: 0 .  fluticasone (FLOVENT HFA) 220 MCG/ACT inhaler, Inhale 1 puff into the lungs daily. , Disp: , Rfl:  .  lamoTRIgine (LAMICTAL) 100 MG tablet, Take 3 tablets (300 mg total) by mouth at bedtime., Disp: 270 tablet, Rfl: 1 .  levothyroxine (SYNTHROID, LEVOTHROID) 50 MCG tablet, Take 50 mcg by mouth daily before breakfast., Disp: , Rfl:  .  loratadine (CLARITIN) 10 MG tablet, Take 1 tablet (10 mg total) by mouth daily., Disp: 30 tablet, Rfl: 11 .  magnesium gluconate (MAGONATE) 500 MG tablet, Take 500 mg by mouth daily., Disp: , Rfl:  .  Multiple Vitamins-Minerals (MULTIVITAMIN WOMEN PO), Take 1 tablet by mouth daily., Disp: , Rfl:  .  QUEtiapine (SEROQUEL) 300 MG tablet, Take 1 tablet (300 mg total) by mouth at bedtime., Disp:  90 tablet, Rfl: 1 .  venlafaxine XR (EFFEXOR-XR) 150 MG 24 hr capsule, Take 2 capsules (300 mg total) by mouth daily., Disp: 180 capsule, Rfl: 1 .  zonisamide (ZONEGRAN) 25 MG capsule, TAKE 4 CAPSULES BY ORAL ROUTE DAILY FOR 30 DAYS, Disp: , Rfl:   Assessment/ Plan: 53 y.o. female   1. Tobacco use In the action phase of smoking cessation.  She was successful with smoking cessation utilizing Chantix previously would like to get back on this.  We discussed the risks of the medication and possible side effects, particularly given history of bipolar disorder.  However, since she tolerated the medicine without difficulty previously I went ahead and sent it in.  No known history of seizure disorder.  We set a goal of smoking cessation for sometime in April.  She will contact me if she  needs any assistance going further with this. - varenicline (CHANTIX STARTING MONTH PAK) 0.5 MG X 11 & 1 MG X 42 tablet; Take 0.5 mg tablet by mouth once daily x3 days, then 0.5 mg tablet twice daily x4 days, then increase to one 1 mg tablet twice daily.  Dispense: 53 tablet; Refill: 0 - varenicline (CHANTIX CONTINUING MONTH PAK) 1 MG tablet; Take 1 tablet (1 mg total) by mouth 2 (two) times daily.  Dispense: 60 tablet; Refill: 2   Start time: 8:11am End time: 8:19am  Total time spent on patient care (including telephone call/ virtual visit): 15 minutes  [place], [place] [phone]

## 2019-04-29 ENCOUNTER — Other Ambulatory Visit: Payer: Self-pay | Admitting: Family Medicine

## 2019-04-29 DIAGNOSIS — J301 Allergic rhinitis due to pollen: Secondary | ICD-10-CM

## 2019-05-06 ENCOUNTER — Encounter: Payer: Self-pay | Admitting: Family Medicine

## 2019-05-06 ENCOUNTER — Ambulatory Visit (INDEPENDENT_AMBULATORY_CARE_PROVIDER_SITE_OTHER): Payer: Medicare Other | Admitting: Family Medicine

## 2019-05-06 ENCOUNTER — Ambulatory Visit: Payer: Medicare Other

## 2019-05-06 DIAGNOSIS — M5441 Lumbago with sciatica, right side: Secondary | ICD-10-CM

## 2019-05-06 DIAGNOSIS — G8929 Other chronic pain: Secondary | ICD-10-CM | POA: Diagnosis not present

## 2019-05-06 MED ORDER — PREDNISONE 10 MG (21) PO TBPK: ORAL_TABLET | ORAL | 0 refills | Status: DC

## 2019-05-06 NOTE — Progress Notes (Signed)
Virtual Visit via Telephone Note  I connected with Rebecca Moreno on 05/10/19 at 2:10 PM by telephone and verified that I am speaking with the correct person using two identifiers. Rebecca Moreno is currently located at her daughter's house and nobody is currently with her during this visit. The provider, Loman Brooklyn, FNP is located in their home at time of visit.  I discussed the limitations, risks, security and privacy concerns of performing an evaluation and management service by telephone and the availability of in person appointments. I also discussed with the patient that there may be a patient responsible charge related to this service. The patient expressed understanding and agreed to proceed.  Subjective: PCP: [name], DO  Chief Complaint  Patient presents with  . Back Pain   Patient has concerns about right leg pain that shoots from her buttock down her leg.  She describes the pain as aching and throbbing, rating it 8/10.  She does sometimes experience tingling in the right leg as well.  Pain makes it very difficult to walk.  She states she cannot walk more than 30 feet due to the pain.  Patient reports pain started in March and has progressively gotten worse.  She has been doing her own exercises at home but has not helped.  She has never been seen for this issue before.   ROS: Per HPI  Current Outpatient Medications:  .  albuterol (PROVENTIL HFA;VENTOLIN HFA) 108 (90 Base) MCG/ACT inhaler, Inhale 1 puff into the lungs every 6 (six) hours as needed for wheezing or shortness of breath., Disp: , Rfl:  .  baclofen (LIORESAL) 10 MG tablet, TAKE 1 TABLET BY MOUTH TWICE A DAY AS NEEDED    MAX  1 2 TIMES/WEEK, Disp: , Rfl:  .  CALCIUM CITRATE-VITAMIN D3 PO, Take 1 tablet by mouth daily., Disp: , Rfl:  .  Cariprazine HCl (VRAYLAR) 6 MG CAPS, Take 1 capsule (6 mg total) by mouth daily., Disp: 90 capsule, Rfl: 1 .  Cholecalciferol (VITAMIN D3) 5000 units TABS, Take 5,000  Units by mouth daily. , Disp: , Rfl:  .  clonazePAM (KLONOPIN) 1 MG tablet, Take 1 tablet (1 mg total) by mouth 2 (two) times daily., Disp: 60 tablet, Rfl: 3 .  Dexlansoprazole (DEXILANT) 30 MG capsule, Take 30 mg by mouth daily., Disp: , Rfl:  .  famotidine (PEPCID) 20 MG tablet, Take 20 mg by mouth daily., Disp: , Rfl:  .  fluticasone (FLONASE) 50 MCG/ACT nasal spray, SPRAY 2 SPRAYS INTO EACH NOSTRIL EVERY DAY, Disp: 48 mL, Rfl: 0 .  fluticasone (FLOVENT HFA) 220 MCG/ACT inhaler, Inhale 1 puff into the lungs daily. , Disp: , Rfl:  .  lamoTRIgine (LAMICTAL) 100 MG tablet, Take 3 tablets (300 mg total) by mouth at bedtime., Disp: 270 tablet, Rfl: 1 .  levothyroxine (SYNTHROID, LEVOTHROID) 50 MCG tablet, Take 50 mcg by mouth daily before breakfast., Disp: , Rfl:  .  loratadine (CLARITIN) 10 MG tablet, Take 1 tablet (10 mg total) by mouth daily., Disp: 30 tablet, Rfl: 11 .  magnesium gluconate (MAGONATE) 500 MG tablet, Take 500 mg by mouth daily., Disp: , Rfl:  .  Multiple Vitamins-Minerals (MULTIVITAMIN WOMEN PO), Take 1 tablet by mouth daily., Disp: , Rfl:  .  predniSONE (STERAPRED UNI-PAK 21 TAB) 10 MG (21) TBPK tablet, Use as directed on back of pill pack, Disp: 21 tablet, Rfl: 0 .  QUEtiapine (SEROQUEL) 300 MG tablet, Take 1 tablet (300 mg  total) by mouth at bedtime., Disp: 90 tablet, Rfl: 1 .  varenicline (CHANTIX CONTINUING MONTH PAK) 1 MG tablet, Take 1 tablet (1 mg total) by mouth 2 (two) times daily., Disp: 60 tablet, Rfl: 2 .  varenicline (CHANTIX STARTING MONTH PAK) 0.5 MG X 11 & 1 MG X 42 tablet, Take 0.5 mg tablet by mouth once daily x3 days, then 0.5 mg tablet twice daily x4 days, then increase to one 1 mg tablet twice daily., Disp: 53 tablet, Rfl: 0 .  venlafaxine XR (EFFEXOR-XR) 150 MG 24 hr capsule, Take 2 capsules (300 mg total) by mouth daily., Disp: 180 capsule, Rfl: 1 .  zonisamide (ZONEGRAN) 25 MG capsule, TAKE 4 CAPSULES BY ORAL ROUTE DAILY FOR 30 DAYS, Disp: , Rfl:    Allergies  Allergen Reactions  . Lithium Other (See Comments)    Psoriasis    Past Medical History:  Diagnosis Date  . Acute pyelonephritis   . Asthma   . Bipolar disorder ([place])   . Colon polyp   . Complication of anesthesia    "sometimes hard to put to sleep"  . Depression    Bipolar  . Gastric ulcer    around 2013.  stress related  . GERD (gastroesophageal reflux disease)   . History of kidney stones   . Hyperlipidemia   . Hypothyroid   . Polyp, uterus corpus   . Sepsis due to Escherichia coli (E. coli) ([place]) 09/04/2018  . Sleep apnea    no cpap used    Observations/Objective: A&O  No respiratory distress or wheezing audible over the phone Mood, judgement, and thought processes all WNL  Assessment and Plan: 1. Chronic low back pain with right-sided sciatica, unspecified back pain laterality - Discussed with patient that insurance most often requires an x-ray and failed physical therapy before they will approve a MRI.  Therefore an x-ray has been ordered today which patient will come for next week.  I am treating her with steroids today to see if it improves her symptoms.  I have given her exercises to do daily at home for back pain and sciatica.  Encourage patient to schedule follow-up appointment with PCP in 6 weeks or sooner if needed.  Discussed that if at her 6-week follow-up her pain was not improved an MRI could possibly be ordered. - DG Lumbar Spine 2-3 Views; Future - predniSONE (STERAPRED UNI-PAK 21 TAB) 10 MG (21) TBPK tablet; Use as directed on back of pill pack  Dispense: 21 tablet; Refill: 0   Follow Up Instructions:  Return in about 6 weeks (around 06/17/2019) for back pain with sciatica.  I discussed the assessment and treatment plan with the patient. The patient was provided an opportunity to ask questions and all were answered. The patient agreed with the plan and demonstrated an understanding of the instructions.   The patient was advised to call  back or seek an in-person evaluation if the symptoms worsen or if the condition fails to improve as anticipated.  The above assessment and management plan was discussed with the patient. The patient verbalized understanding of and has agreed to the management plan. Patient is aware to call the clinic if symptoms persist or worsen. Patient is aware when to return to the clinic for a follow-up visit. Patient educated on when it is appropriate to go to the emergency department.   Time call ended: 2:23 PM  I provided 16 minutes of non-face-to-face time during this encounter.  Hendricks Limes, MSN, APRN, FNP-C Tristan Schroeder  [place] 05/10/19

## 2019-05-06 NOTE — Patient Instructions (Signed)
Sciatica Rehab Ask your health care provider which exercises are safe for you. Do exercises exactly as told by your health care provider and adjust them as directed. It is normal to feel mild stretching, pulling, tightness, or discomfort as you do these exercises. Stop right away if you feel sudden pain or your pain gets worse. Do not begin these exercises until told by your health care provider. Stretching and range-of-motion exercises These exercises warm up your muscles and joints and improve the movement and flexibility of your hips and back. These exercises also help to relieve pain, numbness, and tingling. Sciatic nerve glide 1. Sit in a chair with your head facing down toward your chest. Place your hands behind your back. Let your shoulders slump forward. 2. Slowly straighten one of your legs while you tilt your head back as if you are looking toward the ceiling. Only straighten your leg as far as you can without making your symptoms worse. 3. Hold this position for __________ seconds. 4. Slowly return to the starting position. 5. Repeat with your other leg. Repeat __________ times. Complete this exercise __________ times a day. Knee to chest with hip adduction and internal rotation  1. Lie on your back on a firm surface with both legs straight. 2. Bend one of your knees and move it up toward your chest until you feel a gentle stretch in your lower back and buttock. Then, move your knee toward the shoulder that is on the opposite side from your leg. This is hip adduction and internal rotation. ? Hold your leg in this position by holding on to the front of your knee. 3. Hold this position for __________ seconds. 4. Slowly return to the starting position. 5. Repeat with your other leg. Repeat __________ times. Complete this exercise __________ times a day. Prone extension on elbows  1. Lie on your abdomen on a firm surface. A bed may be too soft for this exercise. 2. Prop yourself up on  your elbows. 3. Use your arms to help lift your chest up until you feel a gentle stretch in your abdomen and your lower back. ? This will place some of your body weight on your elbows. If this is uncomfortable, try stacking pillows under your chest. ? Your hips should stay down, against the surface that you are lying on. Keep your hip and back muscles relaxed. 4. Hold this position for __________ seconds. 5. Slowly relax your upper body and return to the starting position. Repeat __________ times. Complete this exercise __________ times a day. Strengthening exercises These exercises build strength and endurance in your back. Endurance is the ability to use your muscles for a long time, even after they get tired. Pelvic tilt This exercise strengthens the muscles that lie deep in the abdomen. 1. Lie on your back on a firm surface. Bend your knees and keep your feet flat on the floor. 2. Tense your abdominal muscles. Tip your pelvis up toward the ceiling and flatten your lower back into the floor. ? To help with this exercise, you may place a small towel under your lower back and try to push your back into the towel. 3. Hold this position for __________ seconds. 4. Let your muscles relax completely before you repeat this exercise. Repeat __________ times. Complete this exercise __________ times a day. Alternating arm and leg raises  1. Get on your hands and knees on a firm surface. If you are on a hard floor, you may want to use   padding, such as an exercise mat, to cushion your knees. 2. Line up your arms and legs. Your hands should be directly below your shoulders, and your knees should be directly below your hips. 3. Lift your left leg behind you. At the same time, raise your right arm and straighten it in front of you. ? Do not lift your leg higher than your hip. ? Do not lift your arm higher than your shoulder. ? Keep your abdominal and back muscles tight. ? Keep your hips facing the  ground. ? Do not arch your back. ? Keep your balance carefully, and do not hold your breath. 4. Hold this position for __________ seconds. 5. Slowly return to the starting position. 6. Repeat with your right leg and your left arm. Repeat __________ times. Complete this exercise __________ times a day. Posture and body mechanics Good posture and healthy body mechanics can help to relieve stress in your body's tissues and joints. Body mechanics refers to the movements and positions of your body while you do your daily activities. Posture is part of body mechanics. Good posture means:  Your spine is in its natural S-curve position (neutral).  Your shoulders are pulled back slightly.  Your head is not tipped forward. Follow these guidelines to improve your posture and body mechanics in your everyday activities. Standing   When standing, keep your spine neutral and your feet about hip width apart. Keep a slight bend in your knees. Your ears, shoulders, and hips should line up.  When you do a task in which you stand in one place for a long time, place one foot up on a stable object that is 2-4 inches (5-10 cm) high, such as a footstool. This helps keep your spine neutral. Sitting   When sitting, keep your spine neutral and keep your feet flat on the floor. Use a footrest, if necessary, and keep your thighs parallel to the floor. Avoid rounding your shoulders, and avoid tilting your head forward.  When working at a desk or a computer, keep your desk at a height where your hands are slightly lower than your elbows. Slide your chair under your desk so you are close enough to maintain good posture.  When working at a computer, place your monitor at a height where you are looking straight ahead and you do not have to tilt your head forward or downward to look at the screen. Resting  When lying down and resting, avoid positions that are most painful for you.  If you have pain with activities  such as sitting, bending, stooping, or squatting, lie in a position in which your body does not bend very much. For example, avoid curling up on your side with your arms and knees near your chest (fetal position).  If you have pain with activities such as standing for a long time or reaching with your arms, lie with your spine in a neutral position and bend your knees slightly. Try the following positions: ? Lying on your side with a pillow between your knees. ? Lying on your back with a pillow under your knees. Lifting   When lifting objects, keep your feet at least shoulder width apart and tighten your abdominal muscles.  Bend your knees and hips and keep your spine neutral. It is important to lift using the strength of your legs, not your back. Do not lock your knees straight out.  Always ask for help to lift heavy or awkward objects. This information is not   intended to replace advice given to you by your health care provider. Make sure you discuss any questions you have with your health care provider. Document Released: 04/23/2005 Document Revised: 08/15/2018 Document Reviewed: 05/15/2018 Elsevier Patient Education  [place]. Back Exercises These exercises help to make your trunk and back strong. They also help to keep the lower back flexible. Doing these exercises can help to prevent back pain or lessen existing pain.  If you have back pain, try to do these exercises 2-3 times each day or as told by your doctor.  As you get better, do the exercises once each day. Repeat the exercises more often as told by your doctor.  To stop back pain from coming back, do the exercises once each day, or as told by your doctor. Exercises Single knee to chest Do these steps 3-5 times in a row for each leg: 1. Lie on your back on a firm bed or the floor with your legs stretched out. 2. Bring one knee to your chest. 3. Grab your knee or thigh with both hands and hold them it in  place. 4. Pull on your knee until you feel a gentle stretch in your lower back or buttocks. 5. Keep doing the stretch for 10-30 seconds. 6. Slowly let go of your leg and straighten it. Pelvic tilt Do these steps 5-10 times in a row: 1. Lie on your back on a firm bed or the floor with your legs stretched out. 2. Bend your knees so they point up to the ceiling. Your feet should be flat on the floor. 3. Tighten your lower belly (abdomen) muscles to press your lower back against the floor. This will make your tailbone point up to the ceiling instead of pointing down to your feet or the floor. 4. Stay in this position for 5-10 seconds while you gently tighten your muscles and breathe evenly. Cat-cow Do these steps until your lower back bends more easily: 1. Get on your hands and knees on a firm surface. Keep your hands under your shoulders, and keep your knees under your hips. You may put padding under your knees. 2. Let your head hang down toward your chest. Tighten (contract) the muscles in your belly. Point your tailbone toward the floor so your lower back becomes rounded like the back of a cat. 3. Stay in this position for 5 seconds. 4. Slowly lift your head. Let the muscles of your belly relax. Point your tailbone up toward the ceiling so your back forms a sagging arch like the back of a cow. 5. Stay in this position for 5 seconds.  Press-ups Do these steps 5-10 times in a row: 1. Lie on your belly (face-down) on the floor. 2. Place your hands near your head, about shoulder-width apart. 3. While you keep your back relaxed and keep your hips on the floor, slowly straighten your arms to raise the top half of your body and lift your shoulders. Do not use your back muscles. You may change where you place your hands in order to make yourself more comfortable. 4. Stay in this position for 5 seconds. 5. Slowly return to lying flat on the floor.  Bridges Do these steps 10 times in a row: 1. Lie  on your back on a firm surface. 2. Bend your knees so they point up to the ceiling. Your feet should be flat on the floor. Your arms should be flat at your sides, next to your body. 3. Tighten  your butt muscles and lift your butt off the floor until your waist is almost as high as your knees. If you do not feel the muscles working in your butt and the back of your thighs, slide your feet 1-2 inches farther away from your butt. 4. Stay in this position for 3-5 seconds. 5. Slowly lower your butt to the floor, and let your butt muscles relax. If this exercise is too easy, try doing it with your arms crossed over your chest. Belly crunches Do these steps 5-10 times in a row: 1. Lie on your back on a firm bed or the floor with your legs stretched out. 2. Bend your knees so they point up to the ceiling. Your feet should be flat on the floor. 3. Cross your arms over your chest. 4. Tip your chin a little bit toward your chest but do not bend your neck. 5. Tighten your belly muscles and slowly raise your chest just enough to lift your shoulder blades a tiny bit off of the floor. Avoid raising your body higher than that, because it can put too much stress on your low back. 6. Slowly lower your chest and your head to the floor. Back lifts Do these steps 5-10 times in a row: 1. Lie on your belly (face-down) with your arms at your sides, and rest your forehead on the floor. 2. Tighten the muscles in your legs and your butt. 3. Slowly lift your chest off of the floor while you keep your hips on the floor. Keep the back of your head in line with the curve in your back. Look at the floor while you do this. 4. Stay in this position for 3-5 seconds. 5. Slowly lower your chest and your face to the floor. Contact a doctor if:  Your back pain gets a lot worse when you do an exercise.  Your back pain does not get better 2 hours after you exercise. If you have any of these problems, stop doing the exercises. Do  not do them again unless your doctor says it is okay. Get help right away if:  You have sudden, very bad back pain. If this happens, stop doing the exercises. Do not do them again unless your doctor says it is okay. This information is not intended to replace advice given to you by your health care provider. Make sure you discuss any questions you have with your health care provider. Document Released: 05/26/2010 Document Revised: 01/16/2018 Document Reviewed: 01/16/2018 Elsevier Patient Education  2020 [employer].

## 2019-05-13 ENCOUNTER — Other Ambulatory Visit: Payer: Self-pay

## 2019-05-13 ENCOUNTER — Other Ambulatory Visit: Payer: Medicare Other

## 2019-05-13 ENCOUNTER — Ambulatory Visit (INDEPENDENT_AMBULATORY_CARE_PROVIDER_SITE_OTHER): Payer: Medicare Other

## 2019-05-13 ENCOUNTER — Other Ambulatory Visit: Payer: Self-pay | Admitting: Family Medicine

## 2019-05-13 DIAGNOSIS — J301 Allergic rhinitis due to pollen: Secondary | ICD-10-CM

## 2019-05-13 DIAGNOSIS — G8929 Other chronic pain: Secondary | ICD-10-CM

## 2019-05-13 DIAGNOSIS — M5441 Lumbago with sciatica, right side: Secondary | ICD-10-CM | POA: Diagnosis not present

## 2019-05-13 DIAGNOSIS — M545 Low back pain: Secondary | ICD-10-CM | POA: Diagnosis not present

## 2019-05-16 ENCOUNTER — Other Ambulatory Visit: Payer: Self-pay | Admitting: Family Medicine

## 2019-05-16 DIAGNOSIS — Z720 Tobacco use: Secondary | ICD-10-CM

## 2019-05-19 ENCOUNTER — Ambulatory Visit (HOSPITAL_COMMUNITY): Payer: Medicare Other | Admitting: Psychiatry

## 2019-05-20 ENCOUNTER — Telehealth: Payer: Self-pay | Admitting: Family Medicine

## 2019-05-20 NOTE — Telephone Encounter (Signed)
Pt will contact pharmacy again for the monthly pak that was sent in on 04/20/19 #30 with 2 refills

## 2019-05-20 NOTE — Telephone Encounter (Signed)
What is the name of the medication? varenicline (CHANTIX STARTING MONTH PAK) 0.5 MG X 11 & 1 MG X 42 tablet  Have you contacted your pharmacy to request a refill? yes  Which pharmacy would you like this sent to? [facility]   Patient notified that their request is being sent to the clinical staff for review and that they should receive a call once it is complete. If they do not receive a call within 24 hours they can check with their pharmacy or our office.

## 2019-05-27 ENCOUNTER — Inpatient Hospital Stay (HOSPITAL_COMMUNITY): Payer: Medicare Other | Attending: Hematology | Admitting: Hematology

## 2019-05-27 ENCOUNTER — Encounter (HOSPITAL_COMMUNITY): Payer: Self-pay | Admitting: Hematology

## 2019-05-27 ENCOUNTER — Other Ambulatory Visit: Payer: Self-pay

## 2019-05-27 ENCOUNTER — Inpatient Hospital Stay (HOSPITAL_COMMUNITY): Payer: Medicare Other

## 2019-05-27 VITALS — BP 141/75 | HR 88 | Temp 97.1°F | Resp 18 | Wt 201.9 lb

## 2019-05-27 DIAGNOSIS — G4730 Sleep apnea, unspecified: Secondary | ICD-10-CM | POA: Insufficient documentation

## 2019-05-27 DIAGNOSIS — K219 Gastro-esophageal reflux disease without esophagitis: Secondary | ICD-10-CM | POA: Diagnosis not present

## 2019-05-27 DIAGNOSIS — F17210 Nicotine dependence, cigarettes, uncomplicated: Secondary | ICD-10-CM | POA: Diagnosis not present

## 2019-05-27 DIAGNOSIS — F329 Major depressive disorder, single episode, unspecified: Secondary | ICD-10-CM | POA: Diagnosis not present

## 2019-05-27 DIAGNOSIS — Z86010 Personal history of colonic polyps: Secondary | ICD-10-CM | POA: Insufficient documentation

## 2019-05-27 DIAGNOSIS — R7989 Other specified abnormal findings of blood chemistry: Secondary | ICD-10-CM | POA: Diagnosis not present

## 2019-05-27 DIAGNOSIS — Z0000 Encounter for general adult medical examination without abnormal findings: Secondary | ICD-10-CM | POA: Diagnosis not present

## 2019-05-27 DIAGNOSIS — Z87442 Personal history of urinary calculi: Secondary | ICD-10-CM | POA: Diagnosis not present

## 2019-05-27 DIAGNOSIS — E039 Hypothyroidism, unspecified: Secondary | ICD-10-CM | POA: Insufficient documentation

## 2019-05-27 DIAGNOSIS — E875 Hyperkalemia: Secondary | ICD-10-CM | POA: Diagnosis not present

## 2019-05-27 DIAGNOSIS — D751 Secondary polycythemia: Secondary | ICD-10-CM

## 2019-05-27 DIAGNOSIS — Z23 Encounter for immunization: Secondary | ICD-10-CM | POA: Insufficient documentation

## 2019-05-27 DIAGNOSIS — Z79899 Other long term (current) drug therapy: Secondary | ICD-10-CM | POA: Insufficient documentation

## 2019-05-27 LAB — CBC WITH DIFFERENTIAL/PLATELET
Abs Immature Granulocytes: 0.07 10*3/uL (ref 0.00–0.07)
Basophils Absolute: 0.1 10*3/uL (ref 0.0–0.1)
Basophils Relative: 1 %
Eosinophils Absolute: 0 10*3/uL (ref 0.0–0.5)
Eosinophils Relative: 0 %
HCT: 53.1 % — ABNORMAL HIGH (ref 36.0–46.0)
Hemoglobin: 17.5 g/dL — ABNORMAL HIGH (ref 12.0–15.0)
Immature Granulocytes: 1 %
Lymphocytes Relative: 21 %
Lymphs Abs: 2.1 10*3/uL (ref 0.7–4.0)
MCH: 35.2 pg — ABNORMAL HIGH (ref 26.0–34.0)
MCHC: 33 g/dL (ref 30.0–36.0)
MCV: 106.8 fL — ABNORMAL HIGH (ref 80.0–100.0)
Monocytes Absolute: 0.9 10*3/uL (ref 0.1–1.0)
Monocytes Relative: 9 %
Neutro Abs: 6.8 10*3/uL (ref 1.7–7.7)
Neutrophils Relative %: 68 %
Platelets: 203 10*3/uL (ref 150–400)
RBC: 4.97 MIL/uL (ref 3.87–5.11)
RDW: 13.3 % (ref 11.5–15.5)
WBC: 10 10*3/uL (ref 4.0–10.5)
nRBC: 0 % (ref 0.0–0.2)

## 2019-05-27 LAB — LACTATE DEHYDROGENASE: LDH: 207 U/L — ABNORMAL HIGH (ref 98–192)

## 2019-05-27 MED ORDER — INFLUENZA VAC SPLIT QUAD 0.5 ML IM SUSY
0.5000 mL | PREFILLED_SYRINGE | Freq: Once | INTRAMUSCULAR | Status: AC
  Administered 2019-05-27: 15:00:00 0.5 mL via INTRAMUSCULAR
  Filled 2019-05-27: qty 0.5

## 2019-05-27 NOTE — Patient Instructions (Addendum)
[place] at [facility] Discharge Instructions  You were seen today by Dr. Delton Coombes. He went over your recent lab results. Please be sure to follow up with your PCP about the cpap machine and supplies. Follow up with pulmonologist. He will see you back in 4 months for labs and follow up.   Thank you for choosing [place] at [facility] to provide your oncology and hematology care.  To afford each patient quality time with our provider, please arrive at least 15 minutes before your scheduled appointment time.   If you have a lab appointment with the [facility] please come in thru the  Main Entrance and check in at the main information desk  You need to re-schedule your appointment should you arrive 10 or more minutes late.  We strive to give you quality time with our providers, and arriving late affects you and other patients whose appointments are after yours.  Also, if you no show three or more times for appointments you may be dismissed from the clinic at the providers discretion.     Again, thank you for choosing [facility].  Our hope is that these requests will decrease the amount of time that you wait before being seen by our physicians.       _____________________________________________________________  Should you have questions after your visit to [facility], please contact our office at [phone] between the hours of 8:00 a.m. and 4:30 p.m.  Voicemails left after 4:00 p.m. will not be returned until the following business day.  For prescription refill requests, have your pharmacy contact our office and allow 72 hours.    Cancer Center Support Programs:   > Cancer Support Group  2nd Tuesday of the month 1pm-2pm, Journey Room

## 2019-05-27 NOTE — Assessment & Plan Note (Signed)
1.  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  She has chronic headaches for little over a year.  They have not changed. -She is current active smoker, half pack per day for 40 years.  She is not on any diuretics.  Denies any steroid use. - Hemoglobin was 19.9 with hematocrit of 60 on 01/05/2019.  Jak 2 and other mutations were negative. -Erythropoietin was 15.9.  White count was minimally high at 12.3 but was normal on the prior visit. - She also has sleep apnea and is not using CPAP machine from the beginning of this year.  She quit using it as she could not get the small size headgear needed for it. - CT renal study on 09/01/2018 shows mild left hydro ureteral nephrosis due to 4 mm mid left ureteral calculus.  Tiny left renal calculus.  Severe hepatic steatosis. -She underwent phlebotomy on 02/05/2019 with 450 mL of blood removed. -She has not been using CPAP machine. She is cutting back on smoking. She is smoking 7 cigarettes/day. Previously she used to smoke 1 pack/day. -We reviewed latest CBC which showed hemoglobin 17.5 and hematocrit 53. White count is 10 and platelet count is 203. -Chronic headaches are stable. No phlebotomy is needed at this time. She was encouraged to follow-up with her primary doctor to get the supplies to use the CPAP machine. -I will reevaluate her in 4 months.  2.  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  ABG shows PO2 of 51 and PCO2 of 52.  Elevated carboxyhemoglobin is out of proportion to her smoking. - She has carbon monoxide alarm at home placed at our request.  She denies any chronic exposure to vehicle exhaust. -She saw Dr. Luan Pulling and is being worked up.

## 2019-05-27 NOTE — Progress Notes (Signed)
Rebecca Moreno, Rebecca Moreno   CLINIC:  Medical Oncology/Hematology  PCP:  Rebecca Norlander, Rebecca Moreno [place] [id number] [phone]   REASON FOR VISIT:  Follow-up for erythrocytosis and carboxyhemoglobinemia.  CURRENT THERAPY: Phlebotomy.   INTERVAL HISTORY:  Rebecca Moreno 54 y.o. female seen for follow-up of secondary erythrocytosis. She is currently smoking 7 cigarettes/day. She cut back from 1 pack/day. Chronic headaches are stable. Denies any aquagenic pruritus or erythromelalgia. Appetite is 100%. Energy levels are 75%.    REVIEW OF SYSTEMS:  Review of Systems  Respiratory: Positive for cough.   Neurological: Positive for headaches.  All other systems reviewed and are negative.    PAST MEDICAL/SURGICAL HISTORY:  Past Medical History:  Diagnosis Date  . Acute pyelonephritis   . Asthma   . Bipolar disorder ([place])   . Colon polyp   . Complication of anesthesia    "sometimes hard to put to sleep"  . Depression    Bipolar  . Gastric ulcer    around 2013.  stress related  . GERD (gastroesophageal reflux disease)   . History of kidney stones   . Hyperlipidemia   . Hypothyroid   . Polyp, uterus corpus   . Sepsis due to Escherichia coli (E. coli) ([place]) 09/04/2018  . Sleep apnea    no cpap used   Past Surgical History:  Procedure Laterality Date  . ABLATION ON ENDOMETRIOSIS    . COLONOSCOPY     Per patient, done around 2013 in [place], had polyp and overdue for follow up.  . COLONOSCOPY WITH PROPOFOL N/A 05/12/2018   Procedure: COLONOSCOPY WITH PROPOFOL;  Surgeon: Daneil Dolin, MD;  Location: [facility];  Service: Endoscopy;  Laterality: N/A;  12:00pm  . CYSTOSCOPY W/ URETERAL STENT PLACEMENT Left 09/01/2018   Procedure: CYSTOSCOPY WITH RETROGRADE PYELOGRAM/URETERAL STENT PLACEMENT;  Surgeon: Ceasar Mons, MD;  Location: [facility];  Service: Urology;  Laterality: Left;  .  CYSTOSCOPY/URETEROSCOPY/HOLMIUM LASER/STENT PLACEMENT Left 09/17/2018   Procedure: CYSTOSCOPY/URETEROSCOPY/HOLMIUM LASER/STENT PLACEMENT;  Surgeon: Ceasar Mons, MD;  Location: [facility];  Service: Urology;  Laterality: Left;  ONLY NEEDS 45 MIN  . OVARIAN CYST SURGERY Left   . POLYPECTOMY  05/12/2018   Procedure: POLYPECTOMY;  Surgeon: Daneil Dolin, MD;  Location: [facility];  Service: Endoscopy;;  polyp at splenic flexure  . TEMPOROMANDIBULAR JOINT SURGERY     9 surgeries  . TUBAL LIGATION    . vaginal childbirth     1     SOCIAL HISTORY:  Social History   Socioeconomic History  . Marital status: Married    Spouse name: Not on file  . Number of children: 1  . Years of education: Not on file  . Highest education level: High school graduate  Occupational History  . Occupation: disability  Tobacco Use  . Smoking status: Current Every Day Smoker    Packs/day: 0.50    Years: 30.00    Pack years: 15.00    Types: Cigarettes  . Smokeless tobacco: Never Used  Substance and Sexual Activity  . Alcohol use: Never  . Drug use: Not Currently  . Sexual activity: Not Currently    Birth control/protection: Surgical    Comment: tubal  Other Topics Concern  . Not on file  Social History Narrative  . Not on file   Social Determinants of Health   Financial Resource Strain: Low Risk   . Difficulty of Paying Living  Expenses: Not hard at all  Food Insecurity: No Food Insecurity  . Worried About Charity fundraiser in the Last Year: Never true  . Ran Out of Food in the Last Year: Never true  Transportation Needs: No Transportation Needs  . Lack of Transportation (Medical): No  . Lack of Transportation (Non-Medical): No  Physical Activity: Inactive  . Days of Exercise per Week: 0 days  . Minutes of Exercise per Session: 0 min  Stress: Stress Concern Present  . Feeling of Stress : To some extent  Social Connections: Somewhat Isolated  . Frequency of Communication with  Friends and Family: More than three times a week  . Frequency of Social Gatherings with Friends and Family: More than three times a week  . Attends Religious Services: Never  . Active Member of Clubs or Organizations: No  . Attends Archivist Meetings: Never  . Marital Status: Married  Human resources officer Violence: Not At Risk  . Fear of Current or Ex-Partner: No  . Emotionally Abused: No  . Physically Abused: No  . Sexually Abused: No    FAMILY HISTORY:  Family History  Problem Relation Age of Onset  . Anxiety disorder Mother   . Hypertension Mother   . Heart failure Mother   . Stroke Mother   . Hyperlipidemia Father   . Diabetes Father   . Stroke Father   . Hypertension Father   . Colon polyps Father        older than 66  . Diabetes Sister   . Hypertension Sister   . Ovarian cancer Sister   . Asthma Daughter   . Ovarian cancer Maternal Grandmother   . Colon cancer Maternal Grandmother   . Renal Disease Paternal Grandmother   . Heart attack Paternal Grandfather   . Bipolar disorder Other     CURRENT MEDICATIONS:  Outpatient Encounter Medications as of 05/27/2019  Medication Sig  . CALCIUM CITRATE-VITAMIN D3 PO Take 1 tablet by mouth daily.  . Cariprazine HCl (VRAYLAR) 6 MG CAPS Take 1 capsule (6 mg total) by mouth daily.  . Cholecalciferol (VITAMIN D3) 5000 units TABS Take 5,000 Units by mouth daily.   Marland Kitchen Dexlansoprazole (DEXILANT) 30 MG capsule Take 30 mg by mouth daily.  . famotidine (PEPCID) 20 MG tablet Take 20 mg by mouth daily.  . fluticasone (FLOVENT HFA) 220 MCG/ACT inhaler Inhale 1 puff into the lungs daily.   Marland Kitchen lamoTRIgine (LAMICTAL) 100 MG tablet Take 3 tablets (300 mg total) by mouth at bedtime.  Marland Kitchen levothyroxine (SYNTHROID, LEVOTHROID) 50 MCG tablet Take 50 mcg by mouth daily before breakfast.  . loratadine (CLARITIN) 10 MG tablet TAKE 1 TABLET BY MOUTH EVERY DAY  . magnesium gluconate (MAGONATE) 500 MG tablet Take 500 mg by mouth daily.  . Multiple  Vitamins-Minerals (MULTIVITAMIN WOMEN PO) Take 1 tablet by mouth daily.  . QUEtiapine (SEROQUEL) 300 MG tablet Take 1 tablet (300 mg total) by mouth at bedtime.  . varenicline (CHANTIX CONTINUING MONTH PAK) 1 MG tablet Take 1 tablet (1 mg total) by mouth 2 (two) times daily.  Marland Kitchen venlafaxine XR (EFFEXOR-XR) 150 MG 24 hr capsule Take 2 capsules (300 mg total) by mouth daily.  Marland Kitchen zonisamide (ZONEGRAN) 25 MG capsule TAKE 4 CAPSULES BY ORAL ROUTE DAILY FOR 30 DAYS  . albuterol (PROVENTIL HFA;VENTOLIN HFA) 108 (90 Base) MCG/ACT inhaler Inhale 1 puff into the lungs every 6 (six) hours as needed for wheezing or shortness of breath.  . baclofen (LIORESAL) 10 MG  tablet TAKE 1 TABLET BY MOUTH TWICE A DAY AS NEEDED    MAX  1 2 TIMES/WEEK  . clonazePAM (KLONOPIN) 1 MG tablet Take 1 tablet (1 mg total) by mouth 2 (two) times daily. (Patient not taking: Reported on 05/27/2019)  . fluticasone (FLONASE) 50 MCG/ACT nasal spray SPRAY 2 SPRAYS INTO EACH NOSTRIL EVERY DAY (Patient not taking: Reported on 05/27/2019)  . [DISCONTINUED] predniSONE (STERAPRED UNI-PAK 21 TAB) 10 MG (21) TBPK tablet Use as directed on back of pill pack  . [DISCONTINUED] varenicline (CHANTIX STARTING MONTH PAK) 0.5 MG X 11 & 1 MG X 42 tablet Take 0.5 mg tablet by mouth once daily x3 days, then 0.5 mg tablet twice daily x4 days, then increase to one 1 mg tablet twice daily.  . [EXPIRED] influenza vac split quadrivalent PF (FLUARIX) injection 0.5 mL    No facility-administered encounter medications on file as of 05/27/2019.    ALLERGIES:  Allergies  Allergen Reactions  . Lithium Other (See Comments)    Psoriasis      PHYSICAL EXAM:  ECOG Performance status: 1  Vitals:   05/27/19 1429  BP: (!) 141/75  Pulse: 88  Resp: 18  Temp: (!) 97.1 F (36.2 C)  SpO2: 98%   Filed Weights   05/27/19 1429  Weight: 201 lb 14.4 oz (91.6 kg)    Physical Exam Vitals reviewed.  Constitutional:      Appearance: Normal appearance.    Cardiovascular:     Rate and Rhythm: Normal rate and regular rhythm.     Heart sounds: Normal heart sounds.  Pulmonary:     Effort: Pulmonary effort is normal.     Breath sounds: Normal breath sounds.  Abdominal:     General: There is no distension.     Palpations: Abdomen is soft. There is no mass.  Musculoskeletal:        General: No swelling.  Skin:    General: Skin is warm.  Neurological:     General: No focal deficit present.     Mental Status: She is alert and oriented to person, place, and time.  Psychiatric:        Mood and Affect: Mood normal.        Behavior: Behavior normal.      LABORATORY DATA:  I have reviewed the labs as listed.  CBC    Component Value Date/Time   WBC 10.0 05/27/2019 1400   RBC 4.97 05/27/2019 1400   HGB 17.5 (H) 05/27/2019 1400   HGB 19.3 (H) 12/18/2018 1323   HCT 53.1 (H) 05/27/2019 1400   HCT 54.0 (H) 12/18/2018 1323   PLT 203 05/27/2019 1400   PLT 234 12/18/2018 1323   MCV 106.8 (H) 05/27/2019 1400   MCV 100 (H) 12/18/2018 1323   MCH 35.2 (H) 05/27/2019 1400   MCHC 33.0 05/27/2019 1400   RDW 13.3 05/27/2019 1400   RDW 13.4 12/18/2018 1323   LYMPHSABS 2.1 05/27/2019 1400   LYMPHSABS 1.9 01/15/2018 1500   MONOABS 0.9 05/27/2019 1400   EOSABS 0.0 05/27/2019 1400   EOSABS 0.2 01/15/2018 1500   BASOSABS 0.1 05/27/2019 1400   BASOSABS 0.1 01/15/2018 1500   CMP Latest Ref Rng & Units 01/05/2019 12/18/2018 09/15/2018  Glucose 70 - 99 mg/dL 82 108(H) 109(H)  BUN 6 - 20 mg/dL 14 13 13   Creatinine 0.44 - 1.00 mg/dL 1.17(H) 1.21(H) 1.07(H)  Sodium 135 - 145 mmol/L 139 141 141  Potassium 3.5 - 5.1 mmol/L 4.5 4.1 4.2  Chloride  98 - 111 mmol/L 101 100 103  CO2 22 - 32 mmol/L 30 26 27   Calcium 8.9 - 10.3 mg/dL 9.8 9.5 8.6(L)  Total Protein 6.5 - 8.1 g/dL 7.3 6.2 -  Total Bilirubin 0.3 - 1.2 mg/dL 0.4 <0.2 -  Alkaline Phos 38 - 126 U/L 122 130(H) -  AST 15 - 41 U/L 21 21 -  ALT 0 - 44 U/L 24 24 -       DIAGNOSTIC IMAGING:  I have  independently reviewed the scans and discussed with the patient.      ASSESSMENT & PLAN:   Erythrocytosis 1.  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  She has chronic headaches for little over a year.  They have not changed. -She is current active smoker, half pack per day for 40 years.  She is not on any diuretics.  Denies any steroid use. - Hemoglobin was 19.9 with hematocrit of 60 on 01/05/2019.  Jak 2 and other mutations were negative. -Erythropoietin was 15.9.  White count was minimally high at 12.3 but was normal on the prior visit. - She also has sleep apnea and is not using CPAP machine from the beginning of this year.  She quit using it as she could not get the small size headgear needed for it. - CT renal study on 09/01/2018 shows mild left hydro ureteral nephrosis due to 4 mm mid left ureteral calculus.  Tiny left renal calculus.  Severe hepatic steatosis. -She underwent phlebotomy on 02/05/2019 with 450 mL of blood removed. -She has not been using CPAP machine. She is cutting back on smoking. She is smoking 7 cigarettes/day. Previously she used to smoke 1 pack/day. -We reviewed latest CBC which showed hemoglobin 17.5 and hematocrit 53. White count is 10 and platelet count is 203. -Chronic headaches are stable. No phlebotomy is needed at this time. She was encouraged to follow-up with her primary doctor to get the supplies to use the CPAP machine. -I will reevaluate her in 4 months.  2.  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  ABG shows PO2 of 51 and PCO2 of 52.  Elevated carboxyhemoglobin is out of proportion to her smoking. - She has carbon monoxide alarm at home placed at our request.  She denies any chronic exposure to vehicle exhaust. -She saw Dr. Luan Pulling and is being worked up.    Orders placed this encounter:  Orders Placed This Encounter  Procedures  . CBC with Differential      Derek Jack, MD [place] [phone]

## 2019-06-02 ENCOUNTER — Other Ambulatory Visit: Payer: Self-pay

## 2019-06-02 ENCOUNTER — Encounter: Payer: Self-pay | Admitting: Psychiatry

## 2019-06-02 ENCOUNTER — Ambulatory Visit (INDEPENDENT_AMBULATORY_CARE_PROVIDER_SITE_OTHER): Payer: Medicare Other | Admitting: Psychiatry

## 2019-06-02 DIAGNOSIS — F3178 Bipolar disorder, in full remission, most recent episode mixed: Secondary | ICD-10-CM

## 2019-06-02 MED ORDER — CLONAZEPAM 1 MG PO TABS: 1.0000 mg | ORAL_TABLET | Freq: Two times a day (BID) | ORAL | 2 refills | Status: DC

## 2019-06-02 MED ORDER — VRAYLAR 6 MG PO CAPS: 1.0000 | ORAL_CAPSULE | Freq: Every day | ORAL | 1 refills | Status: DC

## 2019-06-02 MED ORDER — LAMOTRIGINE 100 MG PO TABS: 300.0000 mg | ORAL_TABLET | Freq: Every day | ORAL | 1 refills | Status: DC

## 2019-06-02 MED ORDER — VENLAFAXINE HCL ER 150 MG PO CP24: 300.0000 mg | ORAL_CAPSULE | Freq: Every day | ORAL | 1 refills | Status: DC

## 2019-06-02 MED ORDER — QUETIAPINE FUMARATE 300 MG PO TABS: 300.0000 mg | ORAL_TABLET | Freq: Every day | ORAL | 1 refills | Status: DC

## 2019-06-02 NOTE — Progress Notes (Signed)
[facility] MD OP Progress Note  I connected with  Rebecca Moreno on 06/02/19 by a video enabled telemedicine application and verified that I am speaking with the correct person using two identifiers.   I discussed the limitations of evaluation and management by telemedicine. The patient expressed understanding and agreed to proceed.    06/02/2019 2:20 PM Rebecca Moreno  MRN:  [id number]  Chief Complaint: " I am doing well."  HPI: Patient stated that she has been doing well on her current medication regimen.  She stated that she has started working out which is very helpful for her.  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  She takes her clonazepam regularly as without it and will be difficult for her to function.  She denies any acute issues or concerns at this time.  She denies any side effects to her regimen.   Visit Diagnosis:    ICD-10-CM   1. Bipolar 1 disorder, mixed, full remission ([place])  F31.78     Past Psychiatric History: Bipolar d/o  Past Medical History:  Past Medical History:  Diagnosis Date  . Acute pyelonephritis   . Asthma   . Bipolar disorder ([place])   . Colon polyp   . Complication of anesthesia    "sometimes hard to put to sleep"  . Depression    Bipolar  . Gastric ulcer    around 2013.  stress related  . GERD (gastroesophageal reflux disease)   . History of kidney stones   . Hyperlipidemia   . Hypothyroid   . Polyp, uterus corpus   . Sepsis due to Escherichia coli (E. coli) ([place]) 09/04/2018  . Sleep apnea    no cpap used    Past Surgical History:  Procedure Laterality Date  . ABLATION ON ENDOMETRIOSIS    . COLONOSCOPY     Per patient, done around 2013 in [place], had polyp and overdue for follow up.  . COLONOSCOPY WITH PROPOFOL N/A 05/12/2018   Procedure: COLONOSCOPY WITH PROPOFOL;  Surgeon: Daneil Dolin, MD;  Location: [facility];  Service: Endoscopy;  Laterality: N/A;  12:00pm  . CYSTOSCOPY W/ URETERAL  STENT PLACEMENT Left 09/01/2018   Procedure: CYSTOSCOPY WITH RETROGRADE PYELOGRAM/URETERAL STENT PLACEMENT;  Surgeon: Ceasar Mons, MD;  Location: [facility];  Service: Urology;  Laterality: Left;  . CYSTOSCOPY/URETEROSCOPY/HOLMIUM LASER/STENT PLACEMENT Left 09/17/2018   Procedure: CYSTOSCOPY/URETEROSCOPY/HOLMIUM LASER/STENT PLACEMENT;  Surgeon: Ceasar Mons, MD;  Location: [facility];  Service: Urology;  Laterality: Left;  ONLY NEEDS 45 MIN  . OVARIAN CYST SURGERY Left   . POLYPECTOMY  05/12/2018   Procedure: POLYPECTOMY;  Surgeon: Daneil Dolin, MD;  Location: [facility];  Service: Endoscopy;;  polyp at splenic flexure  . TEMPOROMANDIBULAR JOINT SURGERY     9 surgeries  . TUBAL LIGATION    . vaginal childbirth     1    Family Psychiatric History: see below  Family History:  Family History  Problem Relation Age of Onset  . Anxiety disorder Mother   . Hypertension Mother   . Heart failure Mother   . Stroke Mother   . Hyperlipidemia Father   . Diabetes Father   . Stroke Father   . Hypertension Father   . Colon polyps Father        older than 41  . Diabetes Sister   . Hypertension Sister   . Ovarian cancer Sister   . Asthma Daughter   . Ovarian cancer  Maternal Grandmother   . Colon cancer Maternal Grandmother   . Renal Disease Paternal Grandmother   . Heart attack Paternal Grandfather   . Bipolar disorder Other     Social History:  Social History   Socioeconomic History  . Marital status: Married    Spouse name: Not on file  . Number of children: 1  . Years of education: Not on file  . Highest education level: High school graduate  Occupational History  . Occupation: disability  Tobacco Use  . Smoking status: Current Every Day Smoker    Packs/day: 0.50    Years: 30.00    Pack years: 15.00    Types: Cigarettes  . Smokeless tobacco: Never Used  Substance and Sexual Activity  . Alcohol use: Never  . Drug use: Not Currently  . Sexual activity:  Not Currently    Birth control/protection: Surgical    Comment: tubal  Other Topics Concern  . Not on file  Social History Narrative  . Not on file   Social Determinants of Health   Financial Resource Strain: Low Risk   . Difficulty of Paying Living Expenses: Not hard at all  Food Insecurity: No Food Insecurity  . Worried About Charity fundraiser in the Last Year: Never true  . Ran Out of Food in the Last Year: Never true  Transportation Needs: No Transportation Needs  . Lack of Transportation (Medical): No  . Lack of Transportation (Non-Medical): No  Physical Activity: Inactive  . Days of Exercise per Week: 0 days  . Minutes of Exercise per Session: 0 min  Stress: Stress Concern Present  . Feeling of Stress : To some extent  Social Connections: Somewhat Isolated  . Frequency of Communication with Friends and Family: More than three times a week  . Frequency of Social Gatherings with Friends and Family: More than three times a week  . Attends Religious Services: Never  . Active Member of Clubs or Organizations: No  . Attends Archivist Meetings: Never  . Marital Status: Married    Allergies:  Allergies  Allergen Reactions  . Lithium Other (See Comments)    Psoriasis     Metabolic Disorder Labs: No results found for: HGBA1C, MPG No results found for: PROLACTIN Lab Results  Component Value Date   CHOL 212 (H) 12/18/2018   TRIG 171 (H) 12/18/2018   HDL 44 12/18/2018   CHOLHDL 4.8 (H) 12/18/2018   LDLCALC 134 (H) 12/18/2018   LDLCALC 168 (H) 01/15/2018   Lab Results  Component Value Date   TSH 1.480 12/18/2018   TSH 1.550 04/16/2018    Therapeutic Level Labs: No results found for: LITHIUM No results found for: VALPROATE No components found for:  CBMZ  Current Medications: Current Outpatient Medications  Medication Sig Dispense Refill  . albuterol (PROVENTIL HFA;VENTOLIN HFA) 108 (90 Base) MCG/ACT inhaler Inhale 1 puff into the lungs every 6  (six) hours as needed for wheezing or shortness of breath.    . baclofen (LIORESAL) 10 MG tablet TAKE 1 TABLET BY MOUTH TWICE A DAY AS NEEDED    MAX  1 2 TIMES/WEEK    . CALCIUM CITRATE-VITAMIN D3 PO Take 1 tablet by mouth daily.    . Cariprazine HCl (VRAYLAR) 6 MG CAPS Take 1 capsule (6 mg total) by mouth daily. 90 capsule 1  . Cholecalciferol (VITAMIN D3) 5000 units TABS Take 5,000 Units by mouth daily.     . clonazePAM (KLONOPIN) 1 MG tablet Take 1 tablet (  1 mg total) by mouth 2 (two) times daily. (Patient not taking: Reported on 05/27/2019) 60 tablet 3  . Dexlansoprazole (DEXILANT) 30 MG capsule Take 30 mg by mouth daily.    . famotidine (PEPCID) 20 MG tablet Take 20 mg by mouth daily.    . fluticasone (FLONASE) 50 MCG/ACT nasal spray SPRAY 2 SPRAYS INTO EACH NOSTRIL EVERY DAY (Patient not taking: Reported on 05/27/2019) 48 mL 0  . fluticasone (FLOVENT HFA) 220 MCG/ACT inhaler Inhale 1 puff into the lungs daily.     Marland Kitchen lamoTRIgine (LAMICTAL) 100 MG tablet Take 3 tablets (300 mg total) by mouth at bedtime. 270 tablet 1  . levothyroxine (SYNTHROID, LEVOTHROID) 50 MCG tablet Take 50 mcg by mouth daily before breakfast.    . loratadine (CLARITIN) 10 MG tablet TAKE 1 TABLET BY MOUTH EVERY DAY 30 tablet 5  . magnesium gluconate (MAGONATE) 500 MG tablet Take 500 mg by mouth daily.    . Multiple Vitamins-Minerals (MULTIVITAMIN WOMEN PO) Take 1 tablet by mouth daily.    . QUEtiapine (SEROQUEL) 300 MG tablet Take 1 tablet (300 mg total) by mouth at bedtime. 90 tablet 1  . varenicline (CHANTIX CONTINUING MONTH PAK) 1 MG tablet Take 1 tablet (1 mg total) by mouth 2 (two) times daily. 60 tablet 2  . venlafaxine XR (EFFEXOR-XR) 150 MG 24 hr capsule Take 2 capsules (300 mg total) by mouth daily. 180 capsule 1  . zonisamide (ZONEGRAN) 25 MG capsule TAKE 4 CAPSULES BY ORAL ROUTE DAILY FOR 30 DAYS     No current facility-administered medications for this visit.     Musculoskeletal: Strength & Muscle Tone:  unable to assess due to telemed visit Gait & Station: unable to assess due to telemed visit Patient leans: unable to assess due to telemed visit   Psychiatric Specialty Exam: Review of Systems  There were no vitals taken for this visit.There is no height or weight on file to calculate BMI.  General Appearance: Fairly Groomed  Eye Contact:  Good  Speech:  Clear and Coherent and Normal Rate  Volume:  Normal  Mood:  Euthymic  Affect:  Restricted  Thought Process:  Goal Directed, Linear and Descriptions of Associations: Intact  Orientation:  Full (Time, Place, and Person)  Thought Content: Logical   Suicidal Thoughts:  No  Homicidal Thoughts:  No  Memory:  Recent;   Good Remote;   Good  Judgement:  Fair  Insight:  Fair  Psychomotor Activity:  Normal  Concentration:  Concentration: Good and Attention Span: Good  Recall:  Good  Fund of Knowledge: Good  Language: Good  Akathisia:  Negative  Handed:  Right  AIMS (if indicated): not done due to telemed visit  Assets:  Communication Skills Desire for Improvement Financial Resources/Insurance Housing Social Support  ADL's:  Intact  Cognition: WNL  Sleep:  Good   Screenings: GAD-7     Office Visit from 04/16/2018 in [facility]  Total GAD-7 Score  3    Mini-Mental     Clinical Support from 07/22/2018 in [facility]  Total Score (max 30 points )  30    PHQ2-9     Patient Outreach Telephone from 09/10/2018 in [facility] Visit from 07/22/2018 in [facility] Visit from 06/20/2018 in [facility] Visit from 05/19/2018 in [facility] Visit from 04/16/2018 in [facility]  PHQ-2 Total Score  1  2  2  2  2  PHQ-9 Total Score  --  4  7  6  7        Assessment and Plan: Patient reported doing well on her current medication combination.  1. Bipolar 1 disorder,  mixed, full remission ([facility]) - venlafaxine XR (EFFEXOR-XR) 150 MG 24 hr capsule; Take 2 capsules (300 mg total) by mouth daily.  Dispense: 180 capsule; Refill: 1 - QUEtiapine (SEROQUEL) 300 MG tablet; Take 1 tablet (300 mg total) by mouth at bedtime.  Dispense: 90 tablet; Refill: 1 - lamoTRIgine (LAMICTAL) 100 MG tablet; Take 3 tablets (300 mg total) by mouth at bedtime.  Dispense: 270 tablet; Refill: 1 - Cariprazine HCl (VRAYLAR) 6 MG CAPS; Take 1 capsule (6 mg total) by mouth daily.  Dispense: 90 capsule; Refill: 1 - clonazePAM (KLONOPIN) 1 MG tablet; Take 1 tablet (1 mg total) by mouth 2 (two) times daily.  Dispense: 60 tablet; Refill: 2  Continue same medication regimen. Follow up in 3 months.   [place] Crane, MD 06/02/2019, 2:20 PM

## 2019-06-11 ENCOUNTER — Other Ambulatory Visit: Payer: Self-pay | Admitting: Family Medicine

## 2019-06-11 ENCOUNTER — Other Ambulatory Visit: Payer: Self-pay

## 2019-06-11 DIAGNOSIS — Z720 Tobacco use: Secondary | ICD-10-CM

## 2019-06-12 ENCOUNTER — Ambulatory Visit (INDEPENDENT_AMBULATORY_CARE_PROVIDER_SITE_OTHER): Payer: Medicare Other | Admitting: Family Medicine

## 2019-06-12 ENCOUNTER — Encounter: Payer: Self-pay | Admitting: Family Medicine

## 2019-06-12 VITALS — BP 131/85 | HR 89 | Temp 98.2°F | Ht 66.0 in | Wt 199.4 lb

## 2019-06-12 DIAGNOSIS — M5416 Radiculopathy, lumbar region: Secondary | ICD-10-CM | POA: Diagnosis not present

## 2019-06-12 DIAGNOSIS — G8929 Other chronic pain: Secondary | ICD-10-CM

## 2019-06-12 NOTE — Progress Notes (Signed)
Subjective: CC: f/u Low back pain w/ sciatica PCP: [name], DO Rebecca Moreno:[id number] H [name] is a 54 y.o. female presenting to clinic today for:  1.  Chronic low back pain Patient reports ongoing chronic low back pain with radicular symptoms down the right leg.  She notes that after several steps she often feels that this leg is unstable and wants to give out on her.  She does report burning sensation in that leg.  No preceding injury.  She has been trying to work out to improve weight and core strength.  She has not seen physical therapy formally because she is working out with a trainer now.  She had an x-ray done about 6 weeks ago which showed chronic degenerative disc disease.  She does note the prednisone did seem to help.  Unfortunately, she is unable to tolerate oral NSAIDs secondary to severe GI issues with history of esophagitis and rectal bleeding.   ROS: Per HPI  Allergies  Allergen Reactions  . Lithium Other (See Comments)    Psoriasis    Past Medical History:  Diagnosis Date  . Acute pyelonephritis   . Asthma   . Bipolar disorder ([place])   . Colon polyp   . Complication of anesthesia    "sometimes hard to put to sleep"  . Depression    Bipolar  . Gastric ulcer    around 2013.  stress related  . GERD (gastroesophageal reflux disease)   . History of kidney stones   . Hyperlipidemia   . Hypothyroid   . Polyp, uterus corpus   . Sepsis due to Escherichia coli (E. coli) ([place]) 09/04/2018  . Sleep apnea    no cpap used    Current Outpatient Medications:  .  albuterol (PROVENTIL HFA;VENTOLIN HFA) 108 (90 Base) MCG/ACT inhaler, Inhale 1 puff into the lungs every 6 (six) hours as needed for wheezing or shortness of breath., Disp: , Rfl:  .  baclofen (LIORESAL) 10 MG tablet, TAKE 1 TABLET BY MOUTH TWICE A DAY AS NEEDED    MAX  1 2 TIMES/WEEK, Disp: , Rfl:  .  CALCIUM CITRATE-VITAMIN D3 PO, Take 1 tablet by mouth daily., Disp: , Rfl:  .  Cariprazine HCl (VRAYLAR) 6 MG  CAPS, Take 1 capsule (6 mg total) by mouth daily., Disp: 90 capsule, Rfl: 1 .  CHANTIX 1 MG tablet, TAKE 1 TABLET BY MOUTH TWICE A DAY, Disp: 60 tablet, Rfl: 2 .  Cholecalciferol (VITAMIN D3) 5000 units TABS, Take 5,000 Units by mouth daily. , Disp: , Rfl:  .  clonazePAM (KLONOPIN) 1 MG tablet, Take 1 tablet (1 mg total) by mouth 2 (two) times daily., Disp: 60 tablet, Rfl: 2 .  Dexlansoprazole (DEXILANT) 30 MG capsule, Take 30 mg by mouth daily., Disp: , Rfl:  .  famotidine (PEPCID) 20 MG tablet, Take 20 mg by mouth daily., Disp: , Rfl:  .  fluticasone (FLONASE) 50 MCG/ACT nasal spray, SPRAY 2 SPRAYS INTO EACH NOSTRIL EVERY DAY (Patient not taking: Reported on 05/27/2019), Disp: 48 mL, Rfl: 0 .  fluticasone (FLOVENT HFA) 220 MCG/ACT inhaler, Inhale 1 puff into the lungs daily. , Disp: , Rfl:  .  lamoTRIgine (LAMICTAL) 100 MG tablet, Take 3 tablets (300 mg total) by mouth at bedtime., Disp: 270 tablet, Rfl: 1 .  levothyroxine (SYNTHROID, LEVOTHROID) 50 MCG tablet, Take 50 mcg by mouth daily before breakfast., Disp: , Rfl:  .  loratadine (CLARITIN) 10 MG tablet, TAKE 1 TABLET BY MOUTH EVERY DAY, Disp:  30 tablet, Rfl: 5 .  magnesium gluconate (MAGONATE) 500 MG tablet, Take 500 mg by mouth daily., Disp: , Rfl:  .  Multiple Vitamins-Minerals (MULTIVITAMIN WOMEN PO), Take 1 tablet by mouth daily., Disp: , Rfl:  .  QUEtiapine (SEROQUEL) 300 MG tablet, Take 1 tablet (300 mg total) by mouth at bedtime., Disp: 90 tablet, Rfl: 1 .  venlafaxine XR (EFFEXOR-XR) 150 MG 24 hr capsule, Take 2 capsules (300 mg total) by mouth daily., Disp: 180 capsule, Rfl: 1 .  zonisamide (ZONEGRAN) 25 MG capsule, TAKE 4 CAPSULES BY ORAL ROUTE DAILY FOR 30 DAYS, Disp: , Rfl:  Social History   Socioeconomic History  . Marital status: Married    Spouse name: Not on file  . Number of children: 1  . Years of education: Not on file  . Highest education level: High school graduate  Occupational History  . Occupation: disability    Tobacco Use  . Smoking status: Current Every Day Smoker    Packs/day: 0.50    Years: 30.00    Pack years: 15.00    Types: Cigarettes  . Smokeless tobacco: Never Used  Substance and Sexual Activity  . Alcohol use: Never  . Drug use: Not Currently  . Sexual activity: Not Currently    Birth control/protection: Surgical    Comment: tubal  Other Topics Concern  . Not on file  Social History Narrative  . Not on file   Social Determinants of Health   Financial Resource Strain: Low Risk   . Difficulty of Paying Living Expenses: Not hard at all  Food Insecurity: No Food Insecurity  . Worried About Charity fundraiser in the Last Year: Never true  . Ran Out of Food in the Last Year: Never true  Transportation Needs: No Transportation Needs  . Lack of Transportation (Medical): No  . Lack of Transportation (Non-Medical): No  Physical Activity: Inactive  . Days of Exercise per Week: 0 days  . Minutes of Exercise per Session: 0 min  Stress: Stress Concern Present  . Feeling of Stress : To some extent  Social Connections: Somewhat Isolated  . Frequency of Communication with Friends and Family: More than three times a week  . Frequency of Social Gatherings with Friends and Family: More than three times a week  . Attends Religious Services: Never  . Active Member of Clubs or Organizations: No  . Attends Archivist Meetings: Never  . Marital Status: Married  Human resources officer Violence: Not At Risk  . Fear of Current or Ex-Partner: No  . Emotionally Abused: No  . Physically Abused: No  . Sexually Abused: No   Family History  Problem Relation Age of Onset  . Anxiety disorder Mother   . Hypertension Mother   . Heart failure Mother   . Stroke Mother   . Hyperlipidemia Father   . Diabetes Father   . Stroke Father   . Hypertension Father   . Colon polyps Father        older than 25  . Diabetes Sister   . Hypertension Sister   . Ovarian cancer Sister   . Asthma Daughter    . Ovarian cancer Maternal Grandmother   . Colon cancer Maternal Grandmother   . Renal Disease Paternal Grandmother   . Heart attack Paternal Grandfather   . Bipolar disorder Other     Objective: Office vital signs reviewed. BP 131/85   Pulse 89   Temp 98.2 F (36.8 C)   Ht 5'  6" (1.676 m)   Wt 199 lb 6.4 oz (90.4 kg)   SpO2 96%   BMI 32.18 kg/m   Physical Examination:  General: Awake, alert, well nourished, No acute distress Extremities: warm, well perfused, No edema, cyanosis or clubbing; +2 pulses bilaterally MSK: antlagic gait and station  Lumbar spine: Patient has full active range of motion in all planes but does have some pain with extension and rotation.  Negative straight leg raise bilaterally. Neuro: 5/5 lower extremity strength and light touch sensation grossly intact, patellar DTRs 2/4  DG Lumbar Spine 2-3 Views  Result Date: 05/13/2019 CLINICAL DATA:  Low back pain with right sciatic pain. EXAM: LUMBAR SPINE - 2-3 VIEW COMPARISON:  None. FINDINGS: Five lumbar type vertebral bodies show mild curvature convex to the left. Normal disc height at L4-5 and above. Chronic degenerative disc disease at L5-S1 with disc space narrowing, vacuum phenomenon, endplate osteophytes and sclerosis. This could certainly be painful. Sacroiliac joints appear normal. IMPRESSION: Chronic degenerative disc disease at [id number] that could certainly be painful. Electronically Signed   By: Nelson Chimes M.D.   On: 05/13/2019 13:41    Assessment/ Plan: 54 y.o. female   1. Chronic radicular low back pain I reviewed her last office note and recent x-ray, which showed chronic degenerative changes of the L5 on S1 which could explain symptoms.  Given radicular symptoms, we will proceed with MRI of lumbar spine.  Plan for referral pending this imaging study.  Patient aware of red flag signs and symptoms warranting further evaluation.  I did offer her a corticosteroid injection since the prednisone Dosepak  helped previously but she is okay to hold off on this for now.  She will contact me if symptoms become severe. - MR Lumbar Spine Wo Contrast; Future   No orders of the defined types were placed in this encounter.  No orders of the defined types were placed in this encounter.    [name], DO [place] [phone]

## 2019-06-12 NOTE — Patient Instructions (Signed)

## 2019-06-16 ENCOUNTER — Telehealth: Payer: Self-pay | Admitting: Family Medicine

## 2019-06-30 ENCOUNTER — Ambulatory Visit (HOSPITAL_COMMUNITY)
Admission: RE | Admit: 2019-06-30 | Discharge: 2019-06-30 | Disposition: A | Discharge status: Home / Self Care | Payer: Medicare Other | Source: Ambulatory Visit | Attending: Family Medicine | Admitting: Family Medicine

## 2019-06-30 ENCOUNTER — Other Ambulatory Visit: Payer: Self-pay

## 2019-06-30 DIAGNOSIS — M545 Low back pain: Secondary | ICD-10-CM | POA: Diagnosis not present

## 2019-06-30 DIAGNOSIS — M5416 Radiculopathy, lumbar region: Secondary | ICD-10-CM | POA: Insufficient documentation

## 2019-06-30 DIAGNOSIS — G8929 Other chronic pain: Secondary | ICD-10-CM | POA: Insufficient documentation

## 2019-07-23 ENCOUNTER — Ambulatory Visit (INDEPENDENT_AMBULATORY_CARE_PROVIDER_SITE_OTHER): Payer: Medicare Other | Admitting: *Deleted

## 2019-07-23 DIAGNOSIS — Z0000 Encounter for general adult medical examination without abnormal findings: Secondary | ICD-10-CM

## 2019-07-23 NOTE — Progress Notes (Signed)
MEDICARE ANNUAL WELLNESS VISIT  07/23/2019  Telephone Visit Disclaimer This Medicare AWV was conducted by telephone due to national recommendations for restrictions regarding the COVID-19 Pandemic (e.g. social distancing).  I verified, using two identifiers, that I am speaking with Rebecca Moreno or their authorized healthcare agent. I discussed the limitations, risks, security, and privacy concerns of performing an evaluation and management service by telephone and the potential availability of an in-person appointment in the future. The patient expressed understanding and agreed to proceed.   Subjective:  Rebecca Moreno is a 54 y.o. female patient of Rebecca Norlander, DO who had a Medicare Annual Wellness Visit today via telephone. Rebecca Moreno is Disabled and lives with their spouse. she has 1 child. she reports that she is socially active and does interact witfriends/family regularly. she is moderately physically active and enjoys exercising.  Patient Care Team: Rebecca Norlander, DO as PCP - General (Family Medicine) Rebecca Clay, MD as Consulting Physician (Psychiatry) Rebecca Romney Cristopher Estimable, MD as Consulting Physician (Gastroenterology) Rebecca Mons, MD as Consulting Physician (Urology) Rebecca Jack, MD as Consulting Physician (Hematology)  Advanced Directives 07/23/2019 05/27/2019 02/26/2019 02/05/2019 01/26/2019 01/05/2019 01/02/2019  Does Patient Have a Medical Advance Directive? Yes Yes Yes Yes Yes Yes Yes  Type of Advance Directive Living will Healthcare Power of [place] of [place] of [place] of [place] of [place]  Does patient want to make changes to medical advance directive? No - Patient declined No - Patient declined No - Patient declined - No - Patient declined No - Patient declined No - Patient declined  Copy of [place] in Chart? - No - copy  requested No - copy requested No - copy requested No - copy requested No - copy requested No - copy requested  Would patient like information on creating a medical advance directive? - - - - - [facility] Utilization Over the Past 12 Months: # of hospitalizations or ER visits: 0 # of surgeries: 0  Review of Systems    Patient reports that her overall health is better compared to last year.  History obtained from the patient  Patient Reported Readings (BP, Pulse, CBG, Weight, etc) none  Pain Assessment Pain : No/denies pain     Current Medications & Allergies (verified) Allergies as of 07/23/2019      Reactions   Lithium Other (See Comments)   Psoriasis       Medication List       Accurate as of July 23, 2019  3:12 PM. If you have any questions, ask your nurse or doctor.        STOP taking these medications   Acetaminophen-Codeine 300-30 MG tablet   cephALEXin 500 MG capsule Commonly known as: KEFLEX     TAKE these medications   albuterol 108 (90 Base) MCG/ACT inhaler Commonly known as: VENTOLIN HFA Inhale 1 puff into the lungs every 6 (six) hours as needed for wheezing or shortness of breath.   CALCIUM CITRATE-VITAMIN D3 PO Take 1 tablet by mouth daily.   Chantix 1 MG tablet Generic drug: varenicline TAKE 1 TABLET BY MOUTH TWICE A DAY   clindamycin 150 MG capsule Commonly known as: CLEOCIN Take 150 mg by mouth 3 (three) times daily.   clonazePAM 1 MG tablet Commonly known as: KLONOPIN Take 1 tablet (1 mg total) by mouth 2 (two) times daily.   Dexilant  30 MG capsule Generic drug: Dexlansoprazole Take 30 mg by mouth daily.   famotidine 20 MG tablet Commonly known as: PEPCID Take 20 mg by mouth daily.   fluticasone 220 MCG/ACT inhaler Commonly known as: FLOVENT HFA Inhale 1 puff into the lungs daily.   fluticasone 50 MCG/ACT nasal spray Commonly known as: FLONASE SPRAY 2 SPRAYS INTO EACH NOSTRIL EVERY DAY   lamoTRIgine 100 MG  tablet Commonly known as: LAMICTAL Take 3 tablets (300 mg total) by mouth at bedtime.   levothyroxine 50 MCG tablet Commonly known as: SYNTHROID Take 50 mcg by mouth daily before breakfast.   loratadine 10 MG tablet Commonly known as: CLARITIN TAKE 1 TABLET BY MOUTH EVERY DAY   magnesium gluconate 500 MG tablet Commonly known as: MAGONATE Take 500 mg by mouth daily.   MULTIVITAMIN WOMEN PO Take 1 tablet by mouth daily.   QUEtiapine 300 MG tablet Commonly known as: SEROQUEL Take 1 tablet (300 mg total) by mouth at bedtime.   venlafaxine XR 150 MG 24 hr capsule Commonly known as: EFFEXOR-XR Take 2 capsules (300 mg total) by mouth daily.   Vitamin D3 125 MCG (5000 UT) Tabs Take 5,000 Units by mouth daily.   Vraylar 6 MG Caps Generic drug: Cariprazine HCl Take 1 capsule (6 mg total) by mouth daily.       History (reviewed): Past Medical History:  Diagnosis Date  . Acute pyelonephritis   . Asthma   . Bipolar disorder ([place])   . Colon polyp   . Complication of anesthesia    "sometimes hard to put to sleep"  . Depression    Bipolar  . Gastric ulcer    around 2013.  stress related  . GERD (gastroesophageal reflux disease)   . History of kidney stones   . Hyperlipidemia   . Hypothyroid   . Polyp, uterus corpus   . Sepsis due to Escherichia coli (E. coli) ([place]) 09/04/2018  . Sleep apnea    no cpap used   Past Surgical History:  Procedure Laterality Date  . ABLATION ON ENDOMETRIOSIS    . COLONOSCOPY     Per patient, done around 2013 in [place], had polyp and overdue for follow up.  . COLONOSCOPY WITH PROPOFOL N/A 05/12/2018   Procedure: COLONOSCOPY WITH PROPOFOL;  Surgeon: Daneil Dolin, MD;  [facility];  Service: Endoscopy;  Laterality: N/A;  12:00pm  . CYSTOSCOPY W/ URETERAL STENT PLACEMENT Left 09/01/2018   Procedure: CYSTOSCOPY WITH RETROGRADE PYELOGRAM/URETERAL STENT PLACEMENT;  Surgeon: Rebecca Mons, MD;  Location: [facility];   Service: Urology;  Laterality: Left;  . CYSTOSCOPY/URETEROSCOPY/HOLMIUM LASER/STENT PLACEMENT Left 09/17/2018   Procedure: CYSTOSCOPY/URETEROSCOPY/HOLMIUM LASER/STENT PLACEMENT;  Surgeon: Rebecca Mons, MD;  Location: [facility];  Service: Urology;  Laterality: Left;  ONLY NEEDS 45 MIN  . OVARIAN CYST SURGERY Left   . POLYPECTOMY  05/12/2018   Procedure: POLYPECTOMY;  Surgeon: Daneil Dolin, MD;  Location: [facility];  Service: Endoscopy;;  polyp at splenic flexure  . TEMPOROMANDIBULAR JOINT SURGERY     9 surgeries  . TUBAL LIGATION    . vaginal childbirth     1   Family History  Problem Relation Age of Onset  . Anxiety disorder Mother   . Hypertension Mother   . Heart failure Mother   . Stroke Mother   . Hyperlipidemia Father   . Diabetes Father   . Stroke Father   . Hypertension Father   . Colon polyps Father  older than 30  . Diabetes Sister   . Hypertension Sister   . Ovarian cancer Sister   . Asthma Daughter   . Ovarian cancer Maternal Grandmother   . Colon cancer Maternal Grandmother   . Renal Disease Paternal Grandmother   . Heart attack Paternal Grandfather   . Bipolar disorder Other    Social History   Socioeconomic History  . Marital status: Married    Spouse name: Not on file  . Number of children: 1  . Years of education: Not on file  . Highest education level: High school graduate  Occupational History  . Occupation: disability  Tobacco Use  . Smoking status: Current Every Day Smoker    Packs/day: 0.50    Years: 30.00    Pack years: 15.00    Types: Cigarettes  . Smokeless tobacco: Never Used  Substance and Sexual Activity  . Alcohol use: Never  . Drug use: Not Currently  . Sexual activity: Not Currently    Birth control/protection: Surgical    Comment: tubal  Other Topics Concern  . Not on file  Social History Narrative  . Not on file   Social Determinants of Health   Financial Resource Strain: Low Risk   . Difficulty of  Paying Living Expenses: Not very hard  Food Insecurity: Unknown  . Worried About Charity fundraiser in the Last Year: Never true  . Ran Out of Food in the Last Year: Not on file  Transportation Needs: No Transportation Needs  . Lack of Transportation (Medical): No  . Lack of Transportation (Non-Medical): No  Physical Activity: Sufficiently Active  . Days of Exercise per Week: 5 days  . Minutes of Exercise per Session: 90 min  Stress:   . Feeling of Stress :   Social Connections: Not Isolated  . Frequency of Communication with Friends and Family: Three times a week  . Frequency of Social Gatherings with Friends and Family: More than three times a week  . Attends Religious Services: 1 to 4 times per year  . Active Member of Clubs or Organizations: Not on file  . Attends Archivist Meetings: More than 4 times per year  . Marital Status: Married    Activities of Daily Living In your present state of health, do you have any difficulty performing the following activities: 07/23/2019 09/15/2018  Hearing? N N  Vision? N N  Difficulty concentrating or making decisions? N N  Walking or climbing stairs? N N  Dressing or bathing? N N  Doing errands, shopping? N N  Preparing Food and eating ? N -  Using the Toilet? N -  In the past six months, have you accidently leaked urine? N -  Do you have problems with loss of bowel control? N -  Managing your Medications? N -  Managing your Finances? N -  Housekeeping or managing your Housekeeping? N -  Some recent data might be hidden    Patient Education/ Literacy How often do you need to have someone help you when you read instructions, pamphlets, or other written materials from your doctor or pharmacy?: 1 - Never What is the last grade level you completed in school?: 12  Exercise Current Exercise Habits: Structured exercise class, Type of exercise: strength training/weights;Other - see comments;stretching(core), Time (Minutes): 60,  Frequency (Times/Week): 5, Weekly Exercise (Minutes/Week): 300, Intensity: Moderate  Diet Patient reports consuming 2 meals a day and 0 snack(s) a day Patient reports that her primary diet is: Low  fat Patient reports that she does have regular access to food.   Depression Screen PHQ 2/9 Scores 07/23/2019 06/12/2019 09/10/2018 07/22/2018 06/20/2018 05/19/2018 04/16/2018  PHQ - 2 Score 1 0 1 2 2 2 2   PHQ- 9 Score 6 0 - 4 7 6 7      Fall Risk Fall Risk  07/23/2019 07/22/2018 03/04/2018  Falls in the past year? 0 0 No     Objective:  Rebecca Moreno seemed alert and oriented and she participated appropriately during our telephone visit.  Blood Pressure Weight BMI  BP Readings from Last 3 Encounters:  06/12/19 131/85  05/27/19 (!) 141/75  02/26/19 129/74   Wt Readings from Last 3 Encounters:  06/12/19 199 lb 6.4 oz (90.4 kg)  05/27/19 201 lb 14.4 oz (91.6 kg)  02/26/19 206 lb 6.4 oz (93.6 kg)   BMI Readings from Last 1 Encounters:  06/12/19 32.18 kg/m    *Unable to obtain current vital signs, weight, and BMI due to telephone visit type  Hearing/Vision  . Rebecca Moreno did not seem to have difficulty with hearing/understanding during the telephone conversation . Reports that she has had a formal eye exam by an eye care professional within the past year . Reports that she has not had a formal hearing evaluation within the past year *Unable to fully assess hearing and vision during telephone visit type  Cognitive Function: 6CIT Screen 07/23/2019 07/23/2019  What Year? 0 points 0 points  What month? 0 points 0 points  What time? 0 points -  Count back from 20 0 points -  Months in reverse 0 points -  Repeat phrase 0 points -  Total Score 0 -   (Normal:0-7, Significant for Dysfunction: >8)  Normal Cognitive Function Screening: Yes   Immunization & Health Maintenance Record Immunization History  Administered Date(s) Administered  . Influenza Inj Mdck Quad Pf 02/03/2018  .  Influenza,inj,Quad PF,6+ Mos 05/27/2019  . Pneumococcal Conjugate-13 01/06/2016  . Pneumococcal Polysaccharide-23 02/13/2018  . Tdap 01/06/2016    Health Maintenance  Topic Date Due  . HIV Screening  Never done  . PAP SMEAR-Modifier  Never done  . MAMMOGRAM  Never done  . COLONOSCOPY  05/13/2023  . TETANUS/TDAP  01/05/2026  . INFLUENZA VACCINE  Completed       Assessment  This is a routine wellness examination for Rebecca Moreno.  Health Maintenance: Due or Overdue Health Maintenance Due  Topic Date Due  . HIV Screening  Never done  . PAP SMEAR-Modifier  Never done  . MAMMOGRAM  Never done    Rebecca Moreno does not need a referral for Community Assistance: Care Management:   no Social Work:    no Prescription Assistance:  no Nutrition/Diabetes Education:  no   Plan:  Personalized Goals Goals Addressed            This Visit's Progress   . Exercise 150 min/wk Moderate Activity   On track    Walking is a great option.    . Weight (lb) < 180 lb (81.6 kg) (pt-stated)        Personalized Health Maintenance & Screening Recommendations  Screening mammography Screening Pap smear and pelvic exam   Lung Cancer Screening Recommended: yes (Low Dose CT Chest recommended if Age 77-80 years, 30 pack-year currently smoking OR have quit w/in past 15 years) Hepatitis C Screening recommended: yes HIV Screening recommended: yes  Advanced Directives: Written information was not prepared per patient's request.  Referrals & Orders No  orders of the defined types were placed in this encounter.   Follow-up Plan . Follow-up with Rebecca Norlander, DO as planned . Mammogram and pap scheduled for 09/30/19.  Pt voices no concerns and overall feels like her health is better than it was at this time last year.   I have personally reviewed and noted the following in the patient's chart:   . Medical and social history . Use of alcohol, tobacco or illicit drugs  . Current  medications and supplements . Functional ability and status . Nutritional status . Physical activity . Advanced directives . List of other physicians . Hospitalizations, surgeries, and ER visits in previous 12 months . Vitals . Screenings to include cognitive, depression, and falls . Referrals and appointments  In addition, I have reviewed and discussed with Rebecca Moreno certain preventive protocols, quality metrics, and best practice recommendations. A written personalized care plan for preventive services as well as general preventive health recommendations is available and can be mailed to the patient at her request.      Rana Snare, LPN  [phone]

## 2019-07-26 ENCOUNTER — Other Ambulatory Visit: Payer: Self-pay | Admitting: Family Medicine

## 2019-07-26 DIAGNOSIS — J301 Allergic rhinitis due to pollen: Secondary | ICD-10-CM

## 2019-08-11 ENCOUNTER — Telehealth: Payer: Self-pay | Admitting: Family Medicine

## 2019-08-11 ENCOUNTER — Other Ambulatory Visit: Payer: Self-pay | Admitting: Family Medicine

## 2019-08-11 MED ORDER — LEVOTHYROXINE SODIUM 50 MCG PO TABS: 50.0000 ug | ORAL_TABLET | Freq: Every day | ORAL | 0 refills | Status: DC

## 2019-08-11 NOTE — Telephone Encounter (Signed)
Yes. This has been sent. 

## 2019-08-11 NOTE — Telephone Encounter (Signed)
[place]

## 2019-08-11 NOTE — Telephone Encounter (Signed)
  Prescription Request  08/11/2019  What is the name of the medication or equipment? Levothyroxine  Have you contacted your pharmacy to request a refill? (if applicable) Yes  Which pharmacy would you like this sent to? [facility], [facility]  Pt's daughter called stating that pt has been out of her thyroid medicine for 2 mths and was unaware of this. Wants to know if Dr Lajuana Ripple can send in enough of the Rx to the pharmacy to last pt until she can come in for her appt on 08/19/19.   Patient notified that their request is being sent to the clinical staff for review and that they should receive a response within 2 business days.

## 2019-08-19 ENCOUNTER — Encounter: Payer: Self-pay | Admitting: Family Medicine

## 2019-08-19 ENCOUNTER — Telehealth: Payer: Self-pay | Admitting: Family Medicine

## 2019-08-19 ENCOUNTER — Ambulatory Visit (INDEPENDENT_AMBULATORY_CARE_PROVIDER_SITE_OTHER): Payer: Medicare Other | Admitting: Family Medicine

## 2019-08-19 ENCOUNTER — Other Ambulatory Visit: Payer: Self-pay

## 2019-08-19 VITALS — BP 131/80 | HR 91 | Temp 97.1°F | Ht 66.0 in | Wt 206.0 lb

## 2019-08-19 DIAGNOSIS — E039 Hypothyroidism, unspecified: Secondary | ICD-10-CM

## 2019-08-19 DIAGNOSIS — E669 Obesity, unspecified: Secondary | ICD-10-CM | POA: Diagnosis not present

## 2019-08-19 DIAGNOSIS — N1831 Chronic kidney disease, stage 3a: Secondary | ICD-10-CM | POA: Diagnosis not present

## 2019-08-19 DIAGNOSIS — G4733 Obstructive sleep apnea (adult) (pediatric): Secondary | ICD-10-CM

## 2019-08-19 DIAGNOSIS — Z9989 Dependence on other enabling machines and devices: Secondary | ICD-10-CM

## 2019-08-19 DIAGNOSIS — M47816 Spondylosis without myelopathy or radiculopathy, lumbar region: Secondary | ICD-10-CM | POA: Diagnosis not present

## 2019-08-19 MED ORDER — PREDNISONE 10 MG (21) PO TBPK: ORAL_TABLET | ORAL | 0 refills | Status: DC

## 2019-08-19 NOTE — Progress Notes (Signed)
Subjective: CC: Follow-up thyroid, low back pain PCP: [name], DO [id number] H Bushner is a 54 y.o. female presenting to clinic today for:  1.  Hypothyroidism Patient reports compliance with Synthroid 50 mcg daily.  Denies any change in voice, difficulty swallowing, heart palpitations, tremor.    2.  Low back pain Patient had MRI of her back earlier this year which showed some degenerative changes.  Referral to orthopedics versus neurosurgery was offered but patient declined as she was not ready to go through surgery yet.  She is been trying to stay physically active with her daughter at the gym but certain activities do exacerbate symptoms.  She tries avoid these activities.  She has been using OTC NSAIDs and this does seem to help some but does not fully relieve.  No falls.  She knows the weight does not help the back and would like a referral to nutrition.  3.  Obstructive sleep apnea treated with CPAP Patient reports that she had a sleep apnea test done at an outside facility [place].  She was placed on 6 mmHg of pressure.  She tries to be compliant with the CPAP but unfortunately the face mask is ill fitting.     ROS: Per HPI  Allergies  Allergen Reactions  . Lithium Other (See Comments)    Psoriasis    Past Medical History:  Diagnosis Date  . Acute pyelonephritis   . Asthma   . Bipolar disorder ([place])   . Colon polyp   . Complication of anesthesia    "sometimes hard to put to sleep"  . Depression    Bipolar  . Gastric ulcer    around 2013.  stress related  . GERD (gastroesophageal reflux disease)   . History of kidney stones   . Hyperlipidemia   . Hypothyroid   . Polyp, uterus corpus   . Sepsis due to Escherichia coli (E. coli) ([place]) 09/04/2018  . Sleep apnea    no cpap used    Current Outpatient Medications:  .  albuterol (PROVENTIL HFA;VENTOLIN HFA) 108 (90 Base) MCG/ACT inhaler, Inhale 1 puff into the lungs every 6 (six) hours as needed for  wheezing or shortness of breath., Disp: , Rfl:  .  CALCIUM CITRATE-VITAMIN D3 PO, Take 1 tablet by mouth daily., Disp: , Rfl:  .  Cariprazine HCl (VRAYLAR) 6 MG CAPS, Take 1 capsule (6 mg total) by mouth daily., Disp: 90 capsule, Rfl: 1 .  CHANTIX 1 MG tablet, TAKE 1 TABLET BY MOUTH TWICE A DAY, Disp: 60 tablet, Rfl: 2 .  Cholecalciferol (VITAMIN D3) 5000 units TABS, Take 5,000 Units by mouth daily. , Disp: , Rfl:  .  clindamycin (CLEOCIN) 150 MG capsule, Take 150 mg by mouth 3 (three) times daily., Disp: , Rfl:  .  clonazePAM (KLONOPIN) 1 MG tablet, Take 1 tablet (1 mg total) by mouth 2 (two) times daily., Disp: 60 tablet, Rfl: 2 .  Dexlansoprazole (DEXILANT) 30 MG capsule, Take 30 mg by mouth daily., Disp: , Rfl:  .  famotidine (PEPCID) 20 MG tablet, Take 20 mg by mouth daily., Disp: , Rfl:  .  fluticasone (FLONASE) 50 MCG/ACT nasal spray, SPRAY 2 SPRAYS INTO EACH NOSTRIL EVERY DAY, Disp: 48 mL, Rfl: 0 .  fluticasone (FLOVENT HFA) 220 MCG/ACT inhaler, Inhale 1 puff into the lungs daily. , Disp: , Rfl:  .  lamoTRIgine (LAMICTAL) 100 MG tablet, Take 3 tablets (300 mg total) by mouth at bedtime., Disp: 270 tablet, Rfl:  1 .  levothyroxine (SYNTHROID) 50 MCG tablet, Take 1 tablet (50 mcg total) by mouth daily before breakfast., Disp: 30 tablet, Rfl: 0 .  loratadine (CLARITIN) 10 MG tablet, TAKE 1 TABLET BY MOUTH EVERY DAY, Disp: 30 tablet, Rfl: 5 .  magnesium gluconate (MAGONATE) 500 MG tablet, Take 500 mg by mouth daily., Disp: , Rfl:  .  Multiple Vitamins-Minerals (MULTIVITAMIN WOMEN PO), Take 1 tablet by mouth daily., Disp: , Rfl:  .  QUEtiapine (SEROQUEL) 300 MG tablet, Take 1 tablet (300 mg total) by mouth at bedtime., Disp: 90 tablet, Rfl: 1 .  venlafaxine XR (EFFEXOR-XR) 150 MG 24 hr capsule, Take 2 capsules (300 mg total) by mouth daily., Disp: 180 capsule, Rfl: 1 Social History   Socioeconomic History  . Marital status: Married    Spouse name: Not on file  . Number of children: 1  .  Years of education: Not on file  . Highest education level: High school graduate  Occupational History  . Occupation: disability  Tobacco Use  . Smoking status: Current Every Day Smoker    Packs/day: 0.50    Years: 30.00    Pack years: 15.00    Types: Cigarettes  . Smokeless tobacco: Never Used  Substance and Sexual Activity  . Alcohol use: Never  . Drug use: Not Currently  . Sexual activity: Not Currently    Birth control/protection: Surgical    Comment: tubal  Other Topics Concern  . Not on file  Social History Narrative  . Not on file   Social Determinants of Health   Financial Resource Strain: Low Risk   . Difficulty of Paying Living Expenses: Not very hard  Food Insecurity: Unknown  . Worried About Charity fundraiser in the Last Year: Never true  . Ran Out of Food in the Last Year: Not on file  Transportation Needs: No Transportation Needs  . Lack of Transportation (Medical): No  . Lack of Transportation (Non-Medical): No  Physical Activity: Sufficiently Active  . Days of Exercise per Week: 5 days  . Minutes of Exercise per Session: 90 min  Stress:   . Feeling of Stress :   Social Connections: Not Isolated  . Frequency of Communication with Friends and Family: Three times a week  . Frequency of Social Gatherings with Friends and Family: More than three times a week  . Attends Religious Services: 1 to 4 times per year  . Active Member of Clubs or Organizations: Not on file  . Attends Archivist Meetings: More than 4 times per year  . Marital Status: Married  Human resources officer Violence: Not At Risk  . Fear of Current or Ex-Partner: No  . Emotionally Abused: No  . Physically Abused: No  . Sexually Abused: No   Family History  Problem Relation Age of Onset  . Anxiety disorder Mother   . Hypertension Mother   . Heart failure Mother   . Stroke Mother   . Hyperlipidemia Father   . Diabetes Father   . Stroke Father   . Hypertension Father   . Colon  polyps Father        older than 35  . Diabetes Sister   . Hypertension Sister   . Ovarian cancer Sister   . Asthma Daughter   . Ovarian cancer Maternal Grandmother   . Colon cancer Maternal Grandmother   . Renal Disease Paternal Grandmother   . Heart attack Paternal Grandfather   . Bipolar disorder Other  Objective: Office vital signs reviewed. BP 131/80   Pulse 91   Temp (!) 97.1 F (36.2 C)   Ht 5\' 6"  (1.676 m)   Wt 206 lb (93.4 kg)   SpO2 97%   BMI 33.25 kg/m   Physical Examination:  General: Awake, alert, well nourished, No acute distress HEENT: Normal, sclera white, MMM Cardio: regular rate and rhythm, S1S2 heard, no murmurs appreciated Pulm: clear to auscultation bilaterally, no wheezes, rhonchi or rales; normal work of breathing on room air Extremities: warm, well perfused, No edema, cyanosis or clubbing; +2 pulses bilaterally MSK: slow gait and normal station Skin: dry; intact; no rashes or lesions; normal temperature. Neuro: no tremor  Assessment/ Plan: 54 y.o. female   1. Acquired hypothyroidism Asymptomatic.  Check thyroid panel.  Plan to renew medicines tomorrow pending results - Thyroid Panel With TSH  2. Stage 3a chronic kidney disease Check kidney function.  Advised against oral NSAIDs given renal dysfunction.  I placed her on prednisone Dosepak.  She asked that I contact her daughter with updates about today's visit and I will encourage her to see a back specialist at some point after her daughter is getting. - Basic Metabolic Panel  3. Obesity (BMI 30-39.9) Referral to nutrition placed - Amb ref to Medical Nutrition Therapy-MNT  4. Spondylosis of lumbar spine As above.  5. OSA on CPAP D[facility] for CPAP supplies.  Patient does own her CPAP.  Should not have issues with getting supplies through a new supplier locally.  Her studies were done in [place].  I spoke with the representative today.  We will fax these  over.   Orders Placed This Encounter  Procedures  . Basic Metabolic Panel  . Thyroid Panel With TSH  . Amb ref to Medical Nutrition Therapy-MNT    Referral Priority:   Routine    Referral Type:   Consultation    Referral Reason:   Specialty Services Required    Requested Specialty:   Nutrition    Number of Visits Requested:   1   Meds ordered this encounter  Medications  . predniSONE (STERAPRED UNI-PAK 21 TAB) 10 MG (21) TBPK tablet    Sig: As directed x 6 days    Dispense:  21 tablet    Refill:  0   I spoke with patient's daughter today on the phone per patient's request and informed her of today's visit and recommendations.  [name], DO [place] [phone]

## 2019-08-19 NOTE — Telephone Encounter (Signed)
  REFERRAL REQUEST Telephone Note 08/19/2019  What type of referral do you need? Back specialist to have injection  Have you been seen at our office for this problem? Yes today  (Advise that they may need an appointment with their PCP before a referral can be done)  Is there a particular doctor or location that you prefer? No, just where Dr. Darnell Level. Advises her to go   Patient notified that referrals can take up to a week or longer to process. If they haven't heard anything within a week they should call back and speak with the referral department.

## 2019-08-19 NOTE — Patient Instructions (Addendum)
I will work on getting you set up for new CPAP supplies.  Going to try and get you into aero care in [place]  I will call your thyroid medicine in tomorrow soon as we get your thyroid levels back  Referral to dietitian in [place] placed.  This should be covered by insurance.  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  Sometimes the specialist can offer back injections, sometimes at surgery.  Physical therapy sometimes can also help.  When you are ready for surgical evaluation, let me know and I will place a referral.

## 2019-08-20 LAB — BASIC METABOLIC PANEL
BUN/Creatinine Ratio: 27 — ABNORMAL HIGH (ref 9–23)
BUN: 23 mg/dL (ref 6–24)
CO2: 23 mmol/L (ref 20–29)
Calcium: 9.4 mg/dL (ref 8.7–10.2)
Chloride: 100 mmol/L (ref 96–106)
Creatinine, Ser: 0.84 mg/dL (ref 0.57–1.00)
GFR calc Af Amer: 91 mL/min/{1.73_m2} (ref >59)
GFR calc non Af Amer: 79 mL/min/{1.73_m2} (ref >59)
Glucose: 77 mg/dL (ref 65–99)
Potassium: 4.6 mmol/L (ref 3.5–5.2)
Sodium: 142 mmol/L (ref 134–144)

## 2019-08-20 LAB — THYROID PANEL WITH TSH
Free Thyroxine Index: 1.8 (ref 1.2–4.9)
T3 Uptake Ratio: 24 % (ref 24–39)
T4, Total: 7.4 ug/dL (ref 4.5–12.0)
TSH: 2.58 u[IU]/mL (ref 0.450–4.500)

## 2019-08-20 MED ORDER — LEVOTHYROXINE SODIUM 50 MCG PO TABS: 50.0000 ug | ORAL_TABLET | Freq: Every day | ORAL | 1 refills | Status: DC

## 2019-08-20 NOTE — Progress Notes (Signed)
Pt aware and med sent to pharmacy

## 2019-08-20 NOTE — Addendum Note (Signed)
Addended byFaylene Million C on: 08/20/2019 11:30 AM   Modules accepted: Orders

## 2019-08-21 ENCOUNTER — Other Ambulatory Visit: Payer: Self-pay | Admitting: Family Medicine

## 2019-08-21 DIAGNOSIS — M47816 Spondylosis without myelopathy or radiculopathy, lumbar region: Secondary | ICD-10-CM

## 2019-08-21 NOTE — Telephone Encounter (Signed)
Done

## 2019-08-25 NOTE — Progress Notes (Signed)
Virtual Visit via Telephone Note  I connected with Rebecca Moreno on 08/31/19 at  2:30 PM EDT by telephone and verified that I am speaking with the correct person using two identifiers.   I discussed the limitations, risks, security and privacy concerns of performing an evaluation and management service by telephone and the availability of in person appointments. I also discussed with the patient that there may be a patient responsible charge related to this service. The patient expressed understanding and agreed to proceed.    I discussed the assessment and treatment plan with the patient. The patient was provided an opportunity to ask questions and all were answered. The patient agreed with the plan and demonstrated an understanding of the instructions.   The patient was advised to call back or seek an in-person evaluation if the symptoms worsen or if the condition fails to improve as anticipated.  I provided 12 minutes of non-face-to-face time during this encounter.   Norman Clay, MD    [facility] MD/PA/NP OP Progress Note  08/31/2019 2:49 PM Rebecca Moreno  MRN:  [id number]  Chief Complaint:  Chief Complaint    Depression; Anxiety; Follow-up     HPI:  This Is a follow-up for depression.  She states that there has been no change since last visit.  She continues to take care of her mother-in-law, who has dementia.  She takes her to places as her mother-in-law cannot drive. She has "fine" relationship with her husband. They get along, living in the same house. She goes to gym five times a week. She enjoys going out with her friend.  She has initial and middle insomnia.  She is awaiting to schedule the appointment for sleep apnea.  She denies feeling depressed.  She has fair energy and motivation.  She has occasionally decreased appetite and increased appetite.  She denies any weight change.  She denies SI.  She feels anxious and tense at times.  She denies panic attacks.  She denies  decreased need for sleep or euphoria.  She does not want to change her medication as she is doing well with the current medication regimen.    Visit Diagnosis:    ICD-10-CM   1. MDD (major depressive disorder), recurrent, in partial remission ([place])  F33.41   2. Mood disorder ([place])  F39     Past Psychiatric History: Please see initial evaluation for full details. I have reviewed the history. No updates at this time.     Past Medical History:  Past Medical History:  Diagnosis Date  . Acute pyelonephritis   . Asthma   . Bipolar disorder ([place])   . Colon polyp   . Complication of anesthesia    "sometimes hard to put to sleep"  . Depression    Bipolar  . Gastric ulcer    around 2013.  stress related  . GERD (gastroesophageal reflux disease)   . History of kidney stones   . Hyperlipidemia   . Hypothyroid   . Polyp, uterus corpus   . Sepsis due to Escherichia coli (E. coli) ([place]) 09/04/2018  . Sleep apnea    no cpap used    Past Surgical History:  Procedure Laterality Date  . ABLATION ON ENDOMETRIOSIS    . COLONOSCOPY     Per patient, done around 2013 in [place], had polyp and overdue for follow up.  . COLONOSCOPY WITH PROPOFOL N/A 05/12/2018   Procedure: COLONOSCOPY WITH PROPOFOL;  Surgeon: Daneil Dolin, MD;  Location: [facility];  Service: Endoscopy;  Laterality: N/A;  12:00pm  . CYSTOSCOPY W/ URETERAL STENT PLACEMENT Left 09/01/2018   Procedure: CYSTOSCOPY WITH RETROGRADE PYELOGRAM/URETERAL STENT PLACEMENT;  Surgeon: Ceasar Mons, MD;  Location: [facility] OR[facility];  Service: Urology;  Laterality: Left;  . CYSTOSCOPY/URETEROSCOPY/HOLMIUM LASER/STENT PLACEMENT Left 09/17/2018   Procedure: CYSTOSCOPY/URETEROSCOPY/HOLMIUM LASER/STENT PLACEMENT;  Surgeon: Ceasar Mons, MD;  Location: [facility];  Service: Urology;  Laterality: Left;  ONLY NEEDS 45 MIN  . OVARIAN CYST SURGERY Left   . POLYPECTOMY  05/12/2018   Procedure: POLYPECTOMY;  Surgeon: Daneil Dolin, MD;   Location: [facility];  Service: Endoscopy;;  polyp at splenic flexure  . TEMPOROMANDIBULAR JOINT SURGERY     9 surgeries  . TUBAL LIGATION    . vaginal childbirth     1    Family Psychiatric History: Please see initial evaluation for full details. I have reviewed the history. No updates at this time.     Family History:  Family History  Problem Relation Age of Onset  . Anxiety disorder Mother   . Hypertension Mother   . Heart failure Mother   . Stroke Mother   . Hyperlipidemia Father   . Diabetes Father   . Stroke Father   . Hypertension Father   . Colon polyps Father        older than 38  . Diabetes Sister   . Hypertension Sister   . Ovarian cancer Sister   . Asthma Daughter   . Ovarian cancer Maternal Grandmother   . Colon cancer Maternal Grandmother   . Renal Disease Paternal Grandmother   . Heart attack Paternal Grandfather   . Bipolar disorder Other     Social History:  Social History   Socioeconomic History  . Marital status: Married    Spouse name: Not on file  . Number of children: 1  . Years of education: Not on file  . Highest education level: High school graduate  Occupational History  . Occupation: disability  Tobacco Use  . Smoking status: Current Every Day Smoker    Packs/day: 0.50    Years: 30.00    Pack years: 15.00    Types: Cigarettes  . Smokeless tobacco: Never Used  Substance and Sexual Activity  . Alcohol use: Never  . Drug use: Not Currently  . Sexual activity: Not Currently    Birth control/protection: Surgical    Comment: tubal  Other Topics Concern  . Not on file  Social History Narrative  . Not on file   Social Determinants of Health   Financial Resource Strain: Low Risk   . Difficulty of Paying Living Expenses: Not very hard  Food Insecurity: Unknown  . Worried About Charity fundraiser in the Last Year: Never true  . Ran Out of Food in the Last Year: Not on file  Transportation Needs: No Transportation Needs  .  Lack of Transportation (Medical): No  . Lack of Transportation (Non-Medical): No  Physical Activity: Sufficiently Active  . Days of Exercise per Week: 5 days  . Minutes of Exercise per Session: 90 min  Stress:   . Feeling of Stress :   Social Connections: Not Isolated  . Frequency of Communication with Friends and Family: Three times a week  . Frequency of Social Gatherings with Friends and Family: More than three times a week  . Attends Religious Services: 1 to 4 times per year  . Active Member of Clubs or Organizations: Not on file  . Attends  Club or Organization Meetings: More than 4 times per year  . Marital Status: Married    Allergies:  Allergies  Allergen Reactions  . Lithium Other (See Comments)    Psoriasis     Metabolic Disorder Labs: No results found for: HGBA1C, MPG No results found for: PROLACTIN Lab Results  Component Value Date   CHOL 212 (H) 12/18/2018   TRIG 171 (H) 12/18/2018   HDL 44 12/18/2018   CHOLHDL 4.8 (H) 12/18/2018   LDLCALC 134 (H) 12/18/2018   LDLCALC 168 (H) 01/15/2018   Lab Results  Component Value Date   TSH 2.580 08/19/2019   TSH 1.480 12/18/2018    Therapeutic Level Labs: No results found for: LITHIUM No results found for: VALPROATE No components found for:  CBMZ  Current Medications: Current Outpatient Medications  Medication Sig Dispense Refill  . albuterol (PROVENTIL HFA;VENTOLIN HFA) 108 (90 Base) MCG/ACT inhaler Inhale 1 puff into the lungs every 6 (six) hours as needed for wheezing or shortness of breath.    Marland Kitchen CALCIUM CITRATE-VITAMIN D3 PO Take 1 tablet by mouth daily.    . Cariprazine HCl (VRAYLAR) 6 MG CAPS Take 1 capsule (6 mg total) by mouth daily. 90 capsule 1  . CHANTIX 1 MG tablet TAKE 1 TABLET BY MOUTH TWICE A DAY 60 tablet 2  . Cholecalciferol (VITAMIN D3) 5000 units TABS Take 5,000 Units by mouth daily.     . clonazePAM (KLONOPIN) 1 MG tablet Take 1 tablet (1 mg total) by mouth 2 (two) times daily. 60 tablet 2  .  Dexlansoprazole (DEXILANT) 30 MG capsule Take 30 mg by mouth daily.    . famotidine (PEPCID) 20 MG tablet Take 20 mg by mouth daily.    . fluticasone (FLONASE) 50 MCG/ACT nasal spray SPRAY 2 SPRAYS INTO EACH NOSTRIL EVERY DAY 48 mL 0  . fluticasone (FLOVENT HFA) 220 MCG/ACT inhaler Inhale 1 puff into the lungs daily.     Marland Kitchen lamoTRIgine (LAMICTAL) 100 MG tablet Take 3 tablets (300 mg total) by mouth at bedtime. 270 tablet 1  . levothyroxine (SYNTHROID) 50 MCG tablet Take 1 tablet (50 mcg total) by mouth daily. 90 tablet 1  . loratadine (CLARITIN) 10 MG tablet TAKE 1 TABLET BY MOUTH EVERY DAY 30 tablet 5  . magnesium gluconate (MAGONATE) 500 MG tablet Take 500 mg by mouth daily.    . Multiple Vitamins-Minerals (MULTIVITAMIN WOMEN PO) Take 1 tablet by mouth daily.    . predniSONE (STERAPRED UNI-PAK 21 TAB) 10 MG (21) TBPK tablet As directed x 6 days 21 tablet 0  . QUEtiapine (SEROQUEL) 300 MG tablet Take 1 tablet (300 mg total) by mouth at bedtime. 90 tablet 1  . venlafaxine XR (EFFEXOR-XR) 150 MG 24 hr capsule Take 2 capsules (300 mg total) by mouth daily. 180 capsule 1   No current facility-administered medications for this visit.     Musculoskeletal: Strength & Muscle Tone: N/A Gait & Station: N/A Patient leans: N/A  Psychiatric Specialty Exam: Review of Systems  Psychiatric/Behavioral: Positive for sleep disturbance. Negative for agitation, behavioral problems, confusion, decreased concentration, dysphoric mood, hallucinations, self-injury and suicidal ideas. The patient is nervous/anxious. The patient is not hyperactive.   All other systems reviewed and are negative.   There were no vitals taken for this visit.There is no height or weight on file to calculate BMI.  General Appearance: NA  Eye Contact:  NA  Speech:  Clear and Coherent  Volume:  Normal  Mood:  "good"  Affect:  NA  Thought Process:  Coherent  Orientation:  Full (Time, Place, and Person)  Thought Content: Logical    Suicidal Thoughts:  No  Homicidal Thoughts:  No  Memory:  Immediate;   Good  Judgement:  Good  Insight:  Fair  Psychomotor Activity:  Normal  Concentration:  Concentration: Good and Attention Span: Good  Recall:  Good  Fund of Knowledge: Good  Language: Good  Akathisia:  No  Handed:  Right  AIMS (if indicated): not done  Assets:  Communication Skills Desire for Improvement  ADL's:  Intact  Cognition: WNL  Sleep:  Poor   Screenings: GAD-7     Office Visit from 08/19/2019 in [place] Visit from 04/16/2018 in [place]  Total GAD-7 Score  11  3    Mini-Mental     Clinical Support from 07/22/2018 in [place]  Total Score (max 30 points )  30    PHQ2-9     Office Visit from 08/19/2019 in [place] from 07/23/2019 in [place] Office Visit from 06/12/2019 in [place] Patient Outreach Telephone from 09/10/2018 in [place] Visit from 07/22/2018 in [place]  PHQ-2 Total Score  1  1  0  1  2  PHQ-9 Total Score  9  6  0  --  4       Assessment and Plan:  BELIA KOOISTRA is a 54 y.o. year old female with a history of bipolar disorder by history, hypothyroidism , who presents for follow up appointment for MDD (major depressive disorder), recurrent, in partial remission ([place])  Mood disorder ([place])  # Unspecified mood disorder # Bipolar I disorder by history # r/o MDD with psychotic features She denies significant mood symptoms since the last visit.  Psychosocial stressors includes marital conflict with her husband in separation at home, and taking care of her mother in law with dementia.  Although it is preferable to taper down some of the medication to avoid polypharmacy, she has strong preference to stay on the current medication regimen.  Will continue venlafaxine to  target depression.  We will continue lamotrigine for mood dysregulation.  Discussed potential risk of Stevens-Johnson syndrome.  Will continue Vraylar and quetiapine to target mood dysregulation.  Discussed metabolic side effect. Noted that although the patient was diagnosed with bipolar disorder in the past, both the patient and her husband denies any manic/hypomanic episode except mild irritability, paranoia in the context of depression.   Plan I have reviewed and updated plans as below 1. Continue Venlafaxine 300 mg daily  2. Continue Lamotrigine 300 mg daily  3. Continue Vraylar 6 mg  4. Continue Quetiapine 300 mg at night  5. Continue Clonazepam 1 mg twice a dayas needed for anxiety  6.Next appointment: 7/22 at 1 PM for 20 mins, phone 7. Obtain record from your previous psychiatrist- pending  Past trials of medication:sertraline, fluoxetine, lexapro,Celexa, duloxetine,Wellbutrinlithium (psoriasis), Depakote(did not work), olanzapine, Abilify(weight gain), latuda, Geodon  The patient demonstrates the following risk factors for suicide: Chronic risk factors for suicide include:psychiatric disorder ofdepression. Acute risk factorsfor suicide include: family or marital conflict and unemployment. Protective factorsfor this patient include: positive social support and hope for the future. Considering these factors, the overall suicide risk at this point appears to below. Patientisappropriate for outpatient follow up.  Norman Clay, MD 08/31/2019, 2:49 PM

## 2019-08-26 NOTE — Telephone Encounter (Signed)
Spoke with patient - She is aware [facility] will call her to schedule an appt

## 2019-08-31 ENCOUNTER — Encounter (HOSPITAL_COMMUNITY): Payer: Self-pay | Admitting: Psychiatry

## 2019-08-31 ENCOUNTER — Telehealth (INDEPENDENT_AMBULATORY_CARE_PROVIDER_SITE_OTHER): Payer: Medicare Other | Admitting: Psychiatry

## 2019-08-31 ENCOUNTER — Other Ambulatory Visit: Payer: Self-pay

## 2019-08-31 DIAGNOSIS — F39 Unspecified mood [affective] disorder: Secondary | ICD-10-CM

## 2019-08-31 DIAGNOSIS — F3341 Major depressive disorder, recurrent, in partial remission: Secondary | ICD-10-CM | POA: Diagnosis not present

## 2019-08-31 NOTE — Patient Instructions (Signed)
1. Continue Venlafaxine 300 mg daily  2. Continue Lamotrigine 300 mg daily  3. Continue Vraylar 6 mg  4. Continue Quetiapine 300 mg at night  5. Continue Clonazepam 1 mg twice a dayas needed for anxiety  6.Next appointment: 7/22 at 1 PM

## 2019-09-10 ENCOUNTER — Telehealth: Payer: Self-pay | Admitting: *Deleted

## 2019-09-10 NOTE — Telephone Encounter (Signed)
Pt's current CPAP machine does not do auto auto titration will need exact setting. Pt may need a titration study since she did not wear a CPAP machine during her previous study

## 2019-09-11 NOTE — Telephone Encounter (Signed)
Her 2020 sleep study said it was a split night study.  That was done by Dr Luan Pulling and he is unfortunately retired.  I can refer to [facility] and see if they can repeat OR if we can get the study results, we can try and place new CPAP machine orders and see if we can get her one that autotitrates.

## 2019-09-14 NOTE — Telephone Encounter (Signed)
Full sleep study report is under media if this helps

## 2019-09-16 DIAGNOSIS — R4781 Slurred speech: Secondary | ICD-10-CM | POA: Diagnosis not present

## 2019-09-16 DIAGNOSIS — N1831 Chronic kidney disease, stage 3a: Secondary | ICD-10-CM | POA: Diagnosis not present

## 2019-09-16 DIAGNOSIS — K760 Fatty (change of) liver, not elsewhere classified: Secondary | ICD-10-CM | POA: Diagnosis not present

## 2019-09-16 DIAGNOSIS — K219 Gastro-esophageal reflux disease without esophagitis: Secondary | ICD-10-CM | POA: Diagnosis not present

## 2019-09-16 DIAGNOSIS — R410 Disorientation, unspecified: Secondary | ICD-10-CM | POA: Diagnosis not present

## 2019-09-16 DIAGNOSIS — I6522 Occlusion and stenosis of left carotid artery: Secondary | ICD-10-CM | POA: Diagnosis not present

## 2019-09-16 DIAGNOSIS — J9811 Atelectasis: Secondary | ICD-10-CM | POA: Diagnosis not present

## 2019-09-16 DIAGNOSIS — I10 Essential (primary) hypertension: Secondary | ICD-10-CM | POA: Diagnosis not present

## 2019-09-16 DIAGNOSIS — G8191 Hemiplegia, unspecified affecting right dominant side: Secondary | ICD-10-CM | POA: Diagnosis not present

## 2019-09-16 DIAGNOSIS — I639 Cerebral infarction, unspecified: Secondary | ICD-10-CM | POA: Diagnosis not present

## 2019-09-16 DIAGNOSIS — R531 Weakness: Secondary | ICD-10-CM | POA: Diagnosis not present

## 2019-09-16 DIAGNOSIS — R402411 Glasgow coma scale score 13-15, in the field [EMT or ambulance]: Secondary | ICD-10-CM | POA: Diagnosis not present

## 2019-09-16 DIAGNOSIS — N390 Urinary tract infection, site not specified: Secondary | ICD-10-CM | POA: Diagnosis not present

## 2019-09-16 DIAGNOSIS — D751 Secondary polycythemia: Secondary | ICD-10-CM | POA: Diagnosis not present

## 2019-09-16 DIAGNOSIS — E785 Hyperlipidemia, unspecified: Secondary | ICD-10-CM | POA: Diagnosis not present

## 2019-09-16 DIAGNOSIS — E7800 Pure hypercholesterolemia, unspecified: Secondary | ICD-10-CM | POA: Diagnosis not present

## 2019-09-16 DIAGNOSIS — I675 Moyamoya disease: Secondary | ICD-10-CM | POA: Diagnosis not present

## 2019-09-16 DIAGNOSIS — Z8673 Personal history of transient ischemic attack (TIA), and cerebral infarction without residual deficits: Secondary | ICD-10-CM | POA: Insufficient documentation

## 2019-09-16 DIAGNOSIS — I63412 Cerebral infarction due to embolism of left middle cerebral artery: Secondary | ICD-10-CM | POA: Diagnosis not present

## 2019-09-16 DIAGNOSIS — I361 Nonrheumatic tricuspid (valve) insufficiency: Secondary | ICD-10-CM | POA: Diagnosis not present

## 2019-09-16 DIAGNOSIS — R1319 Other dysphagia: Secondary | ICD-10-CM | POA: Diagnosis not present

## 2019-09-16 DIAGNOSIS — Z720 Tobacco use: Secondary | ICD-10-CM | POA: Diagnosis not present

## 2019-09-16 DIAGNOSIS — G4733 Obstructive sleep apnea (adult) (pediatric): Secondary | ICD-10-CM | POA: Diagnosis not present

## 2019-09-16 DIAGNOSIS — I6602 Occlusion and stenosis of left middle cerebral artery: Secondary | ICD-10-CM | POA: Diagnosis not present

## 2019-09-16 DIAGNOSIS — I63232 Cerebral infarction due to unspecified occlusion or stenosis of left carotid arteries: Secondary | ICD-10-CM | POA: Diagnosis not present

## 2019-09-16 DIAGNOSIS — J449 Chronic obstructive pulmonary disease, unspecified: Secondary | ICD-10-CM | POA: Diagnosis not present

## 2019-09-16 DIAGNOSIS — I672 Cerebral atherosclerosis: Secondary | ICD-10-CM | POA: Diagnosis not present

## 2019-09-16 DIAGNOSIS — I129 Hypertensive chronic kidney disease with stage 1 through stage 4 chronic kidney disease, or unspecified chronic kidney disease: Secondary | ICD-10-CM | POA: Diagnosis not present

## 2019-09-16 DIAGNOSIS — R4701 Aphasia: Secondary | ICD-10-CM | POA: Diagnosis not present

## 2019-09-16 DIAGNOSIS — R29703 NIHSS score 3: Secondary | ICD-10-CM | POA: Diagnosis not present

## 2019-09-16 DIAGNOSIS — R404 Transient alteration of awareness: Secondary | ICD-10-CM | POA: Diagnosis not present

## 2019-09-16 DIAGNOSIS — E860 Dehydration: Secondary | ICD-10-CM | POA: Diagnosis not present

## 2019-09-16 DIAGNOSIS — I63512 Cerebral infarction due to unspecified occlusion or stenosis of left middle cerebral artery: Secondary | ICD-10-CM | POA: Diagnosis not present

## 2019-09-16 DIAGNOSIS — R29810 Facial weakness: Secondary | ICD-10-CM | POA: Diagnosis not present

## 2019-09-16 DIAGNOSIS — B9620 Unspecified Escherichia coli [E. coli] as the cause of diseases classified elsewhere: Secondary | ICD-10-CM | POA: Diagnosis not present

## 2019-09-16 DIAGNOSIS — E039 Hypothyroidism, unspecified: Secondary | ICD-10-CM | POA: Diagnosis not present

## 2019-09-17 ENCOUNTER — Other Ambulatory Visit (HOSPITAL_COMMUNITY): Payer: Self-pay | Admitting: Psychiatry

## 2019-09-17 ENCOUNTER — Telehealth (HOSPITAL_COMMUNITY): Payer: Self-pay

## 2019-09-17 DIAGNOSIS — F3178 Bipolar disorder, in full remission, most recent episode mixed: Secondary | ICD-10-CM

## 2019-09-17 MED ORDER — CLONAZEPAM 1 MG PO TABS: 1.0000 mg | ORAL_TABLET | Freq: Two times a day (BID) | ORAL | 2 refills | Status: DC

## 2019-09-17 NOTE — Telephone Encounter (Signed)
Notified patient - LVM 

## 2019-09-17 NOTE — Telephone Encounter (Signed)
Patient called requesting a refill on her Clonazepam 1mg  to be sent to [facility] in [facility]. Followup scheduled for 11/26/19. Thank you.

## 2019-09-17 NOTE — Telephone Encounter (Signed)
Ordered

## 2019-09-18 DIAGNOSIS — I63232 Cerebral infarction due to unspecified occlusion or stenosis of left carotid arteries: Secondary | ICD-10-CM | POA: Insufficient documentation

## 2019-09-18 HISTORY — DX: Cerebral infarction due to unspecified occlusion or stenosis of left carotid arteries: I63.232

## 2019-09-22 ENCOUNTER — Telehealth: Payer: Self-pay | Admitting: *Deleted

## 2019-09-22 ENCOUNTER — Telehealth: Payer: Self-pay | Admitting: Family Medicine

## 2019-09-22 MED ORDER — CARIPRAZINE HCL 3 MG PO CAPS
6.00 | ORAL_CAPSULE | ORAL | Status: DC
Start: 2019-09-22 — End: 2019-09-22

## 2019-09-22 MED ORDER — CEPHALEXIN 250 MG PO CAPS
500.00 | ORAL_CAPSULE | ORAL | Status: DC
Start: 2019-09-21 — End: 2019-09-22

## 2019-09-22 MED ORDER — SODIUM CHLORIDE 0.9 % IV SOLN
10.00 | INTRAVENOUS | Status: DC
Start: ? — End: 2019-09-22

## 2019-09-22 MED ORDER — GENERIC EXTERNAL MEDICATION
Status: DC
Start: ? — End: 2019-09-22

## 2019-09-22 MED ORDER — MOMETASONE FUROATE 220 MCG/INH IN AEPB
1.00 | INHALATION_SPRAY | RESPIRATORY_TRACT | Status: DC
Start: 2019-09-21 — End: 2019-09-22

## 2019-09-22 MED ORDER — FAMOTIDINE 20 MG PO TABS
20.00 | ORAL_TABLET | ORAL | Status: DC
Start: 2019-09-22 — End: 2019-09-22

## 2019-09-22 MED ORDER — SODIUM CHLORIDE 0.9 % IJ SOLN
10.00 | INTRAMUSCULAR | Status: DC
Start: 2019-09-21 — End: 2019-09-22

## 2019-09-22 MED ORDER — DSS 100 MG PO CAPS
100.00 | ORAL_CAPSULE | ORAL | Status: DC
Start: 2019-09-21 — End: 2019-09-22

## 2019-09-22 MED ORDER — NICOTINE 21 MG/24HR TD PT24
1.00 | MEDICATED_PATCH | TRANSDERMAL | Status: DC
Start: 2019-09-22 — End: 2019-09-22

## 2019-09-22 MED ORDER — ASPIRIN 325 MG PO TABS
325.00 | ORAL_TABLET | ORAL | Status: DC
Start: 2019-09-22 — End: 2019-09-22

## 2019-09-22 MED ORDER — VENLAFAXINE HCL ER 75 MG PO CP24
300.00 | ORAL_CAPSULE | ORAL | Status: DC
Start: 2019-09-22 — End: 2019-09-22

## 2019-09-22 MED ORDER — ONDANSETRON HCL 4 MG/2ML IJ SOLN
4.00 | INTRAMUSCULAR | Status: DC
Start: ? — End: 2019-09-22

## 2019-09-22 MED ORDER — GENERIC EXTERNAL MEDICATION
300.00 | Status: DC
Start: 2019-09-21 — End: 2019-09-22

## 2019-09-22 MED ORDER — ATORVASTATIN CALCIUM 80 MG PO TABS
80.00 | ORAL_TABLET | ORAL | Status: DC
Start: 2019-09-21 — End: 2019-09-22

## 2019-09-22 MED ORDER — ALBUTEROL SULFATE HFA 108 (90 BASE) MCG/ACT IN AERS
1.00 | INHALATION_SPRAY | RESPIRATORY_TRACT | Status: DC
Start: ? — End: 2019-09-22

## 2019-09-22 MED ORDER — CLONAZEPAM 1 MG PO TABS
1.00 | ORAL_TABLET | ORAL | Status: DC
Start: 2019-09-21 — End: 2019-09-22

## 2019-09-22 MED ORDER — LAMOTRIGINE 100 MG PO TABS
300.00 | ORAL_TABLET | ORAL | Status: DC
Start: 2019-09-21 — End: 2019-09-22

## 2019-09-22 MED ORDER — MUPIROCIN 2 % EX OINT
TOPICAL_OINTMENT | CUTANEOUS | Status: DC
Start: 2019-09-21 — End: 2019-09-22

## 2019-09-22 MED ORDER — LEVOTHYROXINE SODIUM 50 MCG PO TABS
50.00 | ORAL_TABLET | ORAL | Status: DC
Start: 2019-09-22 — End: 2019-09-22

## 2019-09-22 MED ORDER — PANTOPRAZOLE SODIUM 40 MG PO TBEC
40.00 | DELAYED_RELEASE_TABLET | ORAL | Status: DC
Start: 2019-09-22 — End: 2019-09-22

## 2019-09-22 MED ORDER — LABETALOL HCL 5 MG/ML IV SOLN
10.00 | INTRAVENOUS | Status: DC
Start: ? — End: 2019-09-22

## 2019-09-22 MED ORDER — HEPARIN SODIUM (PORCINE) 5000 UNIT/ML IJ SOLN
5000.00 | INTRAMUSCULAR | Status: DC
Start: 2019-09-21 — End: 2019-09-22

## 2019-09-22 NOTE — Telephone Encounter (Signed)
TRANSITIONAL CARE MANAGEMENT TELEPHONE OUTREACH NOTE   Contact Date: 09/22/2019 Contacted By: Rebecca Ferrier, LPN   DISCHARGE INFORMATION Date of Discharge:09/21/19 Discharge Facility: [facility] Discharge Diagnosis:*Cerebrovascular accident (CVA) due to occlusion of left carotid artery (*) 09/18/2019     Outpatient Follow Up Recommendations (copied from discharge summary) Follow-up appointments: 1. Follow-up with No primary care provider on file.  2. Follow-up with the [facility]roke [facility] as scheduled. 3. Follow-up with Rebecca Moreno as an outpatient. (referral placed)   Rebecca Moreno is a female primary care patient of Rebecca Norlander, DO. An outgoing telephone call was made today and I spoke with patient.  Rebecca Moreno condition(s) and treatment(s) were discussed. An opportunity to ask questions was provided and all were answered or forwarded as appropriate.    ACTIVITIES OF DAILY LIVING  Rebecca Moreno lives with her husband and mother in law and she can perform ADLs independently. her primary caregiver is Rebecca Moreno. she is able to depend on her primary caregiver(s) for consistent help. Transportation to appointments, to pick up medications, and to run errands is not a problem.  (Consider referral to [place] if transportation or a consistent caregiver is a problem)   Fall Risk Fall Risk  08/19/2019 07/23/2019  Falls in the past year? 0 0    low Rebecca Moreno Modifications/Assistive Devices Wheelchair: No Cane: No Ramp: No Bedside Toilet: No Hospital Bed:  No Other:    [place] she is not receiving home health  services.     MEDICATION RECONCILIATION  Rebecca Moreno has been able to pick-up all prescribed discharge medications from the pharmacy.   A post discharge medication reconciliation was performed and the complete medication list was reviewed with the patient/caregiver and is current as of 09/22/2019. Changes highlighted  below.  Discontinued Medications None  Current Medication List  NEW medications  Details  aspirin 325 mg tablet Take one tablet (325 mg dose) by mouth daily. Start date: 09/22/2019, End date: 09/21/2020   atorvastatin (LIPITOR) 80 mg tablet Take one tablet (80 mg dose) by mouth at bedtime. Start date: 09/21/2019   cephALEXin (KEFLEX) 500 mg capsule Take one capsule (500 mg dose) by mouth every 12 (twelve) hours for 4 days. Start date: 09/21/2019, End date: 09/25/2019   nicotine (HABITROL,NICODERM CQ) 21 mg/24 hours Place one patch onto the skin daily. Start date: 09/22/2019     Allergies as of 09/22/2019      Reactions   Lithium Other (See Comments)   Psoriasis       Medication List       Accurate as of Sep 22, 2019  2:35 PM. If you have any questions, ask your nurse or doctor.        albuterol 108 (90 Base) MCG/ACT inhaler Commonly known as: VENTOLIN HFA Inhale 1 puff into the lungs every 6 (six) hours as needed for wheezing or shortness of breath.   CALCIUM CITRATE-VITAMIN D3 PO Take 1 tablet by mouth daily.   Chantix 1 MG tablet Generic drug: varenicline TAKE 1 TABLET BY MOUTH TWICE A DAY   clonazePAM 1 MG tablet Commonly known as: KLONOPIN Take 1 tablet (1 mg total) by mouth 2 (two) times daily.   Dexilant 30 MG capsule Generic drug: Dexlansoprazole Take 30 mg by mouth daily.   famotidine 20 MG tablet Commonly known as: PEPCID Take 20 mg by mouth daily.   fluticasone 220 MCG/ACT inhaler Commonly known as: FLOVENT HFA  Inhale 1 puff into the lungs daily.   fluticasone 50 MCG/ACT nasal spray Commonly known as: FLONASE SPRAY 2 SPRAYS INTO EACH NOSTRIL EVERY DAY   lamoTRIgine 100 MG tablet Commonly known as: LAMICTAL Take 3 tablets (300 mg total) by mouth at bedtime.   levothyroxine 50 MCG tablet Commonly known as: SYNTHROID Take 1 tablet (50 mcg total) by mouth daily.   loratadine 10 MG tablet Commonly known as: CLARITIN TAKE 1 TABLET BY MOUTH  EVERY DAY   magnesium gluconate 500 MG tablet Commonly known as: MAGONATE Take 500 mg by mouth daily.   MULTIVITAMIN WOMEN PO Take 1 tablet by mouth daily.   predniSONE 10 MG (21) Tbpk tablet Commonly known as: STERAPRED UNI-PAK 21 TAB As directed x 6 days   QUEtiapine 300 MG tablet Commonly known as: SEROQUEL Take 1 tablet (300 mg total) by mouth at bedtime.   venlafaxine XR 150 MG 24 hr capsule Commonly known as: EFFEXOR-XR Take 2 capsules (300 mg total) by mouth daily.   Vitamin D3 125 MCG (5000 UT) Tabs Take 5,000 Units by mouth daily.   Vraylar 6 MG Caps Generic drug: Cariprazine HCl Take 1 capsule (6 mg total) by mouth daily.        PATIENT EDUCATION & FOLLOW-UP PLAN  An appointment for Transitional Care Management is scheduled with Rebecca Comber, NP on 09/25/2019 at 2:00pm.  Take all medications as prescribed  Contact our office by calling [phone] if you have any questions or concerns

## 2019-09-23 ENCOUNTER — Inpatient Hospital Stay (HOSPITAL_COMMUNITY): Payer: Medicare Other

## 2019-09-23 ENCOUNTER — Ambulatory Visit (HOSPITAL_COMMUNITY): Payer: Medicare Other | Admitting: Hematology

## 2019-09-23 ENCOUNTER — Other Ambulatory Visit: Payer: Self-pay | Admitting: Family Medicine

## 2019-09-23 DIAGNOSIS — Z720 Tobacco use: Secondary | ICD-10-CM

## 2019-09-23 DIAGNOSIS — Z8673 Personal history of transient ischemic attack (TIA), and cerebral infarction without residual deficits: Secondary | ICD-10-CM | POA: Diagnosis not present

## 2019-09-23 NOTE — Telephone Encounter (Signed)
I'm confused.  This order was placed 4/14.  Did they not receive?

## 2019-09-23 NOTE — Telephone Encounter (Signed)
Full sleep study report is under media if this helps

## 2019-09-24 DIAGNOSIS — Z7982 Long term (current) use of aspirin: Secondary | ICD-10-CM | POA: Diagnosis not present

## 2019-09-24 DIAGNOSIS — I69320 Aphasia following cerebral infarction: Secondary | ICD-10-CM | POA: Diagnosis not present

## 2019-09-24 DIAGNOSIS — B9620 Unspecified Escherichia coli [E. coli] as the cause of diseases classified elsewhere: Secondary | ICD-10-CM | POA: Diagnosis not present

## 2019-09-24 DIAGNOSIS — N390 Urinary tract infection, site not specified: Secondary | ICD-10-CM | POA: Diagnosis not present

## 2019-09-24 DIAGNOSIS — D751 Secondary polycythemia: Secondary | ICD-10-CM | POA: Diagnosis not present

## 2019-09-24 DIAGNOSIS — J449 Chronic obstructive pulmonary disease, unspecified: Secondary | ICD-10-CM | POA: Diagnosis not present

## 2019-09-24 DIAGNOSIS — I69351 Hemiplegia and hemiparesis following cerebral infarction affecting right dominant side: Secondary | ICD-10-CM | POA: Diagnosis not present

## 2019-09-24 DIAGNOSIS — Z720 Tobacco use: Secondary | ICD-10-CM | POA: Diagnosis not present

## 2019-09-24 DIAGNOSIS — N1831 Chronic kidney disease, stage 3a: Secondary | ICD-10-CM | POA: Diagnosis not present

## 2019-09-24 DIAGNOSIS — J9811 Atelectasis: Secondary | ICD-10-CM | POA: Diagnosis not present

## 2019-09-25 ENCOUNTER — Encounter: Payer: Self-pay | Admitting: Nurse Practitioner

## 2019-09-25 ENCOUNTER — Other Ambulatory Visit: Payer: Self-pay

## 2019-09-25 ENCOUNTER — Ambulatory Visit (INDEPENDENT_AMBULATORY_CARE_PROVIDER_SITE_OTHER): Payer: Medicare Other | Admitting: Nurse Practitioner

## 2019-09-25 VITALS — BP 113/79 | HR 90 | Temp 98.4°F | Resp 20 | Ht 66.0 in | Wt 200.0 lb

## 2019-09-25 DIAGNOSIS — I6930 Unspecified sequelae of cerebral infarction: Secondary | ICD-10-CM

## 2019-09-25 NOTE — Patient Instructions (Addendum)
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 Patient knows to follow up as needed.  Continue to work on eating a healthy low fat, diet and exercise   Hospital discharge orders reviewed  Hospital Discharge After a Stroke  Being discharged from the hospital after a stroke can feel overwhelming. Many things may be different, and it is normal to feel scared or anxious. Some stroke survivors may be able to return to their homes, and others may need more specialized care on a temporary or permanent basis. Your stroke care team will work with you to develop a discharge plan that is best for you. Ask questions if you do not understand something. Invite a friend or family member to participate in discharge planning. Understanding and following your discharge plan can help to prevent another stroke or other problems. Understanding your medicines After a stroke, your health care provider may prescribe one or more types of medicine. It is important to take medicines exactly as told by your health care provider. Serious harm, such as another stroke, can happen if you are unable to take your medicine exactly as prescribed. Make sure you understand:  What medicine to take.  Why you are taking the medicine.  How and when to take it.  If it can be taken with your other medicines and herbal supplements.  Possible side effects.  When to call your health care provider if you have any side effects.  How you will get and pay for your medicines. Medical assistance programs may be able to help you pay for prescription medicines if you cannot afford them. If you are taking an anticoagulant, be sure to take it exactly as told by your health care provider. This type of medicine can increase the  risk of bleeding because it works to prevent blood from clotting. You may need to take certain precautions to prevent bleeding. You should contact your health care provider if you have:  Bleeding or bruising.  A fall or other injury to your head.  Blood in your urine or stool (feces). Planning for home safety  Take steps to prevent falls, such as installing grab bars or using a shower chair. Ask a friend or family member to get needed things in place before you go home if possible. A therapist can come to your home to make recommendations for safety equipment. Ask your health care provider if you would benefit from this service or from home care. Getting needed equipment Ask your health care provider for a list of any medical equipment and supplies you will need at home. These may include items such as:  Walkers.  Canes.  Wheelchairs.  Hand-strengthening devices.  Special eating utensils. Medical equipment can be rented or purchased, depending on your insurance coverage. Check with your insurance company about what is covered. Keeping follow-up visits After a stroke, you will need to follow up regularly with a health care provider. You may also need rehabilitation, which can include physical therapy, occupational therapy, or speech-language therapy. Keeping these appointments is very important to your recovery after a stroke. Be sure to bring your medicine list and discharge papers with you to your appointments. If you need help to keep track of your schedule, use a calendar or appointment reminder. Preventing another stroke Having a stroke puts you at risk  for another stroke in the future. Ask your health care provider what actions you can take to lower the risk. These may include:  Increasing how much you exercise.  Making a healthy eating plan.  Quitting smoking.  Managing other health conditions, such as high blood pressure, high cholesterol, or diabetes.  Limiting alcohol  use. Knowing the warning signs of a stroke  Make sure you understand the signs of a stroke. Before you leave the hospital, you will receive information outlining the stroke warning signs. Share these with your friends and family members. "BE FAST" is an easy way to remember the main warning signs of a stroke:  B - Balance. Signs are dizziness, sudden trouble walking, or loss of balance.  E - Eyes. Signs are trouble seeing or a sudden change in vision.  F - Face. Signs are sudden weakness or numbness of the face, or the face or eyelid drooping on one side.  A - Arms. Signs are weakness or numbness in an arm. This happens suddenly and usually on one side of the body.  S - Speech. Signs are sudden trouble speaking, slurred speech, or trouble understanding what people say.  T - Time. Time to call emergency services. Write down what time symptoms started. Other signs of stroke may include:  A sudden, severe headache with no known cause.  Nausea or vomiting.  Seizure. These symptoms may represent a serious problem that is an emergency. Do not wait to see if the symptoms will go away. Get medical help right away. Call your local emergency services (911 in the [place] Do not drive yourself to the hospital. Make note of the time that you had your first symptoms. Your emergency responders or emergency room staff will need to know this information. Summary  Being discharged from the hospital after a stroke can feel overwhelming. It is normal to feel scared or anxious.  Make sure you take medicines exactly as told by your health care provider.  Know the warning signs of a stroke, and get help right way if you have any of these symptoms. "BE FAST" is an easy way to remember the main warning signs of a stroke. This information is not intended to replace advice given to you by your health care provider. Make sure you discuss any questions you have with your health care provider. Document Revised:  01/14/2019 Document Reviewed: 07/27/2016 Elsevier Patient Education  [place].

## 2019-09-25 NOTE — Progress Notes (Signed)
Established Patient Office Visit  Subjective:  Patient ID: Rebecca Moreno, female    DOB: [birth year]  Age: 54 y.o. MRN: [id number]  CC:  Chief Complaint  Patient presents with  . Transitions Of Care    hosp follow up - [facility]- CVA    HPI TABER SANKEY presents for late effect CVA.  Past Medical History:  Diagnosis Date  . Acute pyelonephritis   . Asthma   . Bipolar disorder ([place])   . Colon polyp   . Complication of anesthesia    "sometimes hard to put to sleep"  . Depression    Bipolar  . Gastric ulcer    around 2013.  stress related  . GERD (gastroesophageal reflux disease)   . History of kidney stones   . Hyperlipidemia   . Hypothyroid   . Polyp, uterus corpus   . Sepsis due to Escherichia coli (E. coli) ([place]) 09/04/2018  . Sleep apnea    no cpap used  . Stroke [facility])     Past Surgical History:  Procedure Laterality Date  . ABLATION ON ENDOMETRIOSIS    . COLONOSCOPY     Per patient, done around 2013 in [place], had polyp and overdue for follow up.  . COLONOSCOPY WITH PROPOFOL N/A 05/12/2018   Procedure: COLONOSCOPY WITH PROPOFOL;  Surgeon: Daneil Dolin, MD;  Location: [facility];  Service: Endoscopy;  Laterality: N/A;  12:00pm  . CYSTOSCOPY W/ URETERAL STENT PLACEMENT Left 09/01/2018   Procedure: CYSTOSCOPY WITH RETROGRADE PYELOGRAM/URETERAL STENT PLACEMENT;  Surgeon: Ceasar Mons, MD;  Location: [facility] OR[facility];  Service: Urology;  Laterality: Left;  . CYSTOSCOPY/URETEROSCOPY/HOLMIUM LASER/STENT PLACEMENT Left 09/17/2018   Procedure: CYSTOSCOPY/URETEROSCOPY/HOLMIUM LASER/STENT PLACEMENT;  Surgeon: Ceasar Mons, MD;  Location: [facility];  Service: Urology;  Laterality: Left;  ONLY NEEDS 45 MIN  . OVARIAN CYST SURGERY Left   . POLYPECTOMY  05/12/2018   Procedure: POLYPECTOMY;  Surgeon: Daneil Dolin, MD;  Location: [facility];  Service: Endoscopy;;  polyp at splenic flexure  . TEMPOROMANDIBULAR JOINT SURGERY     9 surgeries  . TUBAL  LIGATION    . vaginal childbirth     1    Family History  Problem Relation Age of Onset  . Anxiety disorder Mother   . Hypertension Mother   . Heart failure Mother   . Stroke Mother   . Hyperlipidemia Father   . Diabetes Father   . Stroke Father   . Hypertension Father   . Colon polyps Father        older than 45  . Diabetes Sister   . Hypertension Sister   . Ovarian cancer Sister   . Asthma Daughter   . Ovarian cancer Maternal Grandmother   . Colon cancer Maternal Grandmother   . Renal Disease Paternal Grandmother   . Heart attack Paternal Grandfather   . Bipolar disorder Other     Social History   Socioeconomic History  . Marital status: Married    Spouse name: Not on file  . Number of children: 1  . Years of education: Not on file  . Highest education level: High school graduate  Occupational History  . Occupation: disability  Tobacco Use  . Smoking status: Current Every Day Smoker    Packs/day: 0.50    Years: 30.00    Pack years: 15.00    Types: Cigarettes  . Smokeless tobacco: Never Used  Substance and Sexual Activity  . Alcohol use: Never  . Drug use:  Not Currently  . Sexual activity: Not Currently    Birth control/protection: Surgical    Comment: tubal  Other Topics Concern  . Not on file  Social History Narrative  . Not on file   Social Determinants of Health   Financial Resource Strain: Low Risk   . Difficulty of Paying Living Expenses: Not very hard  Food Insecurity: Unknown  . Worried About Charity fundraiser in the Last Year: Never true  . Ran Out of Food in the Last Year: Not on file  Transportation Needs: No Transportation Needs  . Lack of Transportation (Medical): No  . Lack of Transportation (Non-Medical): No  Physical Activity: Sufficiently Active  . Days of Exercise per Week: 5 days  . Minutes of Exercise per Session: 90 min  Stress:   . Feeling of Stress :   Social Connections: Not Isolated  . Frequency of Communication with  Friends and Family: Three times a week  . Frequency of Social Gatherings with Friends and Family: More than three times a week  . Attends Religious Services: 1 to 4 times per year  . Active Member of Clubs or Organizations: Not on file  . Attends Archivist Meetings: More than 4 times per year  . Marital Status: Married  Human resources officer Violence: Not At Risk  . Fear of Current or Ex-Partner: No  . Emotionally Abused: No  . Physically Abused: No  . Sexually Abused: No    Outpatient Medications Prior to Visit  Medication Sig Dispense Refill  . albuterol (PROVENTIL HFA;VENTOLIN HFA) 108 (90 Base) MCG/ACT inhaler Inhale 1 puff into the lungs every 6 (six) hours as needed for wheezing or shortness of breath.    Marland Kitchen aspirin 325 MG tablet Take 1 tablet by mouth daily.    Marland Kitchen atorvastatin (LIPITOR) 80 MG tablet Take 80 mg by mouth daily.    Marland Kitchen CALCIUM CITRATE-VITAMIN D3 PO Take 1 tablet by mouth daily.    . Cariprazine HCl (VRAYLAR) 6 MG CAPS Take 1 capsule (6 mg total) by mouth daily. 90 capsule 1  . cephALEXin (KEFLEX) 500 MG capsule Take 500 mg by mouth 2 (two) times daily.    . Cholecalciferol (VITAMIN D3) 5000 units TABS Take 5,000 Units by mouth daily.     . clonazePAM (KLONOPIN) 1 MG tablet Take 1 tablet (1 mg total) by mouth 2 (two) times daily. 60 tablet 2  . Dexlansoprazole (DEXILANT) 30 MG capsule Take 30 mg by mouth daily.    . famotidine (PEPCID) 20 MG tablet Take 20 mg by mouth daily.    . fluticasone (FLONASE) 50 MCG/ACT nasal spray SPRAY 2 SPRAYS INTO EACH NOSTRIL EVERY DAY 48 mL 0  . fluticasone (FLOVENT HFA) 220 MCG/ACT inhaler Inhale 1 puff into the lungs daily.     Marland Kitchen lamoTRIgine (LAMICTAL) 100 MG tablet Take 3 tablets (300 mg total) by mouth at bedtime. 270 tablet 1  . levothyroxine (SYNTHROID) 50 MCG tablet Take 1 tablet (50 mcg total) by mouth daily. 90 tablet 1  . loratadine (CLARITIN) 10 MG tablet TAKE 1 TABLET BY MOUTH EVERY DAY 30 tablet 5  . magnesium gluconate  (MAGONATE) 500 MG tablet Take 500 mg by mouth daily.    . Multiple Vitamins-Minerals (MULTIVITAMIN WOMEN PO) Take 1 tablet by mouth daily.    . QUEtiapine (SEROQUEL) 300 MG tablet Take 1 tablet (300 mg total) by mouth at bedtime. 90 tablet 1  . venlafaxine XR (EFFEXOR-XR) 150 MG 24 hr capsule Take  2 capsules (300 mg total) by mouth daily. 180 capsule 1  . CHANTIX 1 MG tablet TAKE 1 TABLET BY MOUTH TWICE A DAY 180 tablet 0  . predniSONE (STERAPRED UNI-PAK 21 TAB) 10 MG (21) TBPK tablet As directed x 6 days 21 tablet 0   No facility-administered medications prior to visit.    Allergies  Allergen Reactions  . Lithium Other (See Comments)    Psoriasis     ROS Review of Systems  Constitutional: Negative.   HENT: Negative.   Eyes: Negative.   Respiratory: Negative.   Cardiovascular: Negative for chest pain, palpitations and leg swelling.  Gastrointestinal: Negative for nausea and vomiting.  Endocrine: Negative.   Genitourinary: Negative.   Musculoskeletal: Negative.   Skin: Negative for color change and rash.  Allergic/Immunologic: Negative.   Neurological: Negative for facial asymmetry and headaches.  Psychiatric/Behavioral: The patient is not nervous/anxious.       Objective:    Physical Exam  Constitutional: She is oriented to person, place, and time. She appears well-developed and well-nourished.  HENT:  Mouth/Throat: Oropharynx is clear and moist.  Eyes: Conjunctivae are normal.  Cardiovascular: Normal rate and regular rhythm.  Pulmonary/Chest: Breath sounds normal.  Abdominal: Bowel sounds are normal.  Musculoskeletal:        General: No tenderness or edema.     Cervical back: Neck supple.  Neurological: She is alert and oriented to person, place, and time.  Skin: No rash noted. No erythema.  Psychiatric: She has a normal mood and affect. Her behavior is normal.    Resp 20   Ht 5\' 6"  (1.676 m)   Wt 200 lb (90.7 kg)   LMP 12/13/2017 (Approximate)   BMI 32.28  kg/m  Wt Readings from Last 3 Encounters:  09/25/19 200 lb (90.7 kg)  08/19/19 206 lb (93.4 kg)  06/12/19 199 lb 6.4 oz (90.4 kg)     Health Maintenance Due  Topic Date Due  . COVID-19 Vaccine (1) Never done  . HIV Screening  Never done  . PAP SMEAR-Modifier  Never done  . MAMMOGRAM  Never done    There are no preventive care reminders to display for this patient.  Lab Results  Component Value Date   TSH 2.580 08/19/2019   Lab Results  Component Value Date   WBC 10.0 05/27/2019   HGB 17.5 (H) 05/27/2019   HCT 53.1 (H) 05/27/2019   MCV 106.8 (H) 05/27/2019   PLT 203 05/27/2019   Lab Results  Component Value Date   NA 142 08/19/2019   K 4.6 08/19/2019   CO2 23 08/19/2019   GLUCOSE 77 08/19/2019   BUN 23 08/19/2019   CREATININE 0.84 08/19/2019   BILITOT 0.4 01/05/2019   ALKPHOS 122 01/05/2019   AST 21 01/05/2019   ALT 24 01/05/2019   PROT 7.3 01/05/2019   ALBUMIN 4.2 01/05/2019   CALCIUM 9.4 08/19/2019   ANIONGAP 8 01/05/2019   Lab Results  Component Value Date   CHOL 212 (H) 12/18/2018   Lab Results  Component Value Date   HDL 44 12/18/2018   Lab Results  Component Value Date   LDLCALC 134 (H) 12/18/2018   Lab Results  Component Value Date   TRIG 171 (H) 12/18/2018   Lab Results  Component Value Date   CHOLHDL 4.8 (H) 12/18/2018   No results found for: HGBA1C    Assessment & Plan:   Problem List Items Addressed This Visit      Other   Late  effect of cerebrovascular accident (CVA) - Primary    Patient is late effect CVA, well managed on current plan, no changes to current medication, no labs collected today, reviewed hospital discharge and medication with patient, provide education and printed handout to patient. Patient knows to follow up as needed.  Continue to work on eating a healthy low fat, diet and exercise           Follow-up: Return if symptoms worsen or fail to improve.    Ivy Lynn, NP

## 2019-09-25 NOTE — Assessment & Plan Note (Addendum)
Patient is late effect CVA, well managed on current plan, no changes to current medication, no labs collected today, reviewed hospital discharge and medication with patient, provide education and printed handout to patient. Patient knows to follow up as needed.   Continue to work on eating a healthy low fat, diet and exercise

## 2019-09-28 DIAGNOSIS — Z7982 Long term (current) use of aspirin: Secondary | ICD-10-CM | POA: Diagnosis not present

## 2019-09-28 DIAGNOSIS — J449 Chronic obstructive pulmonary disease, unspecified: Secondary | ICD-10-CM | POA: Diagnosis not present

## 2019-09-28 DIAGNOSIS — B9620 Unspecified Escherichia coli [E. coli] as the cause of diseases classified elsewhere: Secondary | ICD-10-CM | POA: Diagnosis not present

## 2019-09-28 DIAGNOSIS — J9811 Atelectasis: Secondary | ICD-10-CM | POA: Diagnosis not present

## 2019-09-28 DIAGNOSIS — Z720 Tobacco use: Secondary | ICD-10-CM | POA: Diagnosis not present

## 2019-09-28 DIAGNOSIS — D751 Secondary polycythemia: Secondary | ICD-10-CM | POA: Diagnosis not present

## 2019-09-28 DIAGNOSIS — I69320 Aphasia following cerebral infarction: Secondary | ICD-10-CM | POA: Diagnosis not present

## 2019-09-28 DIAGNOSIS — N1831 Chronic kidney disease, stage 3a: Secondary | ICD-10-CM | POA: Diagnosis not present

## 2019-09-28 DIAGNOSIS — I69351 Hemiplegia and hemiparesis following cerebral infarction affecting right dominant side: Secondary | ICD-10-CM | POA: Diagnosis not present

## 2019-09-28 DIAGNOSIS — N390 Urinary tract infection, site not specified: Secondary | ICD-10-CM | POA: Diagnosis not present

## 2019-09-29 ENCOUNTER — Ambulatory Visit (INDEPENDENT_AMBULATORY_CARE_PROVIDER_SITE_OTHER): Payer: Medicare Other

## 2019-09-29 ENCOUNTER — Other Ambulatory Visit: Payer: Self-pay

## 2019-09-29 DIAGNOSIS — Z7982 Long term (current) use of aspirin: Secondary | ICD-10-CM | POA: Diagnosis not present

## 2019-09-29 DIAGNOSIS — D751 Secondary polycythemia: Secondary | ICD-10-CM | POA: Diagnosis not present

## 2019-09-29 DIAGNOSIS — I69320 Aphasia following cerebral infarction: Secondary | ICD-10-CM

## 2019-09-29 DIAGNOSIS — J449 Chronic obstructive pulmonary disease, unspecified: Secondary | ICD-10-CM | POA: Diagnosis not present

## 2019-09-29 DIAGNOSIS — N390 Urinary tract infection, site not specified: Secondary | ICD-10-CM | POA: Diagnosis not present

## 2019-09-29 DIAGNOSIS — B9620 Unspecified Escherichia coli [E. coli] as the cause of diseases classified elsewhere: Secondary | ICD-10-CM

## 2019-09-29 DIAGNOSIS — J9811 Atelectasis: Secondary | ICD-10-CM | POA: Diagnosis not present

## 2019-09-29 DIAGNOSIS — F3170 Bipolar disorder, currently in remission, most recent episode unspecified: Secondary | ICD-10-CM

## 2019-09-29 DIAGNOSIS — I69351 Hemiplegia and hemiparesis following cerebral infarction affecting right dominant side: Secondary | ICD-10-CM

## 2019-09-29 DIAGNOSIS — Z720 Tobacco use: Secondary | ICD-10-CM | POA: Diagnosis not present

## 2019-09-29 DIAGNOSIS — N1831 Chronic kidney disease, stage 3a: Secondary | ICD-10-CM

## 2019-09-30 ENCOUNTER — Ambulatory Visit (INDEPENDENT_AMBULATORY_CARE_PROVIDER_SITE_OTHER): Payer: Medicare Other | Admitting: Family Medicine

## 2019-09-30 ENCOUNTER — Other Ambulatory Visit: Payer: Self-pay

## 2019-09-30 ENCOUNTER — Encounter: Payer: Self-pay | Admitting: Family Medicine

## 2019-09-30 VITALS — BP 117/83 | HR 87 | Temp 96.7°F | Ht 66.0 in | Wt 197.6 lb

## 2019-09-30 DIAGNOSIS — Z0000 Encounter for general adult medical examination without abnormal findings: Secondary | ICD-10-CM

## 2019-09-30 DIAGNOSIS — G4733 Obstructive sleep apnea (adult) (pediatric): Secondary | ICD-10-CM

## 2019-09-30 DIAGNOSIS — Z124 Encounter for screening for malignant neoplasm of cervix: Secondary | ICD-10-CM | POA: Diagnosis not present

## 2019-09-30 DIAGNOSIS — I6930 Unspecified sequelae of cerebral infarction: Secondary | ICD-10-CM

## 2019-09-30 DIAGNOSIS — Z0001 Encounter for general adult medical examination with abnormal findings: Secondary | ICD-10-CM | POA: Diagnosis not present

## 2019-09-30 DIAGNOSIS — E669 Obesity, unspecified: Secondary | ICD-10-CM | POA: Diagnosis not present

## 2019-09-30 DIAGNOSIS — Z1231 Encounter for screening mammogram for malignant neoplasm of breast: Secondary | ICD-10-CM | POA: Diagnosis not present

## 2019-09-30 NOTE — Progress Notes (Signed)
Rebecca Moreno is a 54 y.o. female presents to office today for annual physical exam examination.    Concerns today include: 1. OSA BiPAP Patient needing BiPAP settings for [employer].  She was managed by Dr Luan Pulling.  Dr Merlene Laughter read study in 02/2019 but never actually saw patient.  Last colonoscopy: UTD Last mammogram: had today Last pap smear: needs  Past Medical History:  Diagnosis Date  . Acute pyelonephritis   . Asthma   . Bipolar disorder ([place])   . Colon polyp   . Complication of anesthesia    "sometimes hard to put to sleep"  . Depression    Bipolar  . Gastric ulcer    around 2013.  stress related  . GERD (gastroesophageal reflux disease)   . History of kidney stones   . Hyperlipidemia   . Hypothyroid   . Polyp, uterus corpus   . Sepsis due to Escherichia coli (E. coli) ([place]) 09/04/2018  . Sleep apnea    no cpap used  . Stroke [facility])    Social History   Socioeconomic History  . Marital status: Married    Spouse name: Not on file  . Number of children: 1  . Years of education: Not on file  . Highest education level: High school graduate  Occupational History  . Occupation: disability  Tobacco Use  . Smoking status: Former Smoker    Packs/day: 0.50    Years: 30.00    Pack years: 15.00    Types: Cigarettes    Quit date: 09/06/2019    Years since quitting: 0.0  . Smokeless tobacco: Never Used  Substance and Sexual Activity  . Alcohol use: Never  . Drug use: Not Currently  . Sexual activity: Not Currently    Birth control/protection: Surgical    Comment: tubal  Other Topics Concern  . Not on file  Social History Narrative  . Not on file   Social Determinants of Health   Financial Resource Strain: Low Risk   . Difficulty of Paying Living Expenses: Not very hard  Food Insecurity: Unknown  . Worried About Charity fundraiser in the Last Year: Never true  . Ran Out of Food in the Last Year: Not on file  Transportation Needs: No Transportation Needs    . Lack of Transportation (Medical): No  . Lack of Transportation (Non-Medical): No  Physical Activity: Sufficiently Active  . Days of Exercise per Week: 5 days  . Minutes of Exercise per Session: 90 min  Stress:   . Feeling of Stress :   Social Connections: Not Isolated  . Frequency of Communication with Friends and Family: Three times a week  . Frequency of Social Gatherings with Friends and Family: More than three times a week  . Attends Religious Services: 1 to 4 times per year  . Active Member of Clubs or Organizations: Not on file  . Attends Archivist Meetings: More than 4 times per year  . Marital Status: Married  Human resources officer Violence: Not At Risk  . Fear of Current or Ex-Partner: No  . Emotionally Abused: No  . Physically Abused: No  . Sexually Abused: No   Past Surgical History:  Procedure Laterality Date  . ABLATION ON ENDOMETRIOSIS    . COLONOSCOPY     Per patient, done around 2013 in [place], had polyp and overdue for follow up.  . COLONOSCOPY WITH PROPOFOL N/A 05/12/2018   Procedure: COLONOSCOPY WITH PROPOFOL;  Surgeon: Daneil Dolin, MD;  [facility];  Service: Endoscopy;  Laterality: N/A;  12:00pm  . CYSTOSCOPY W/ URETERAL STENT PLACEMENT Left 09/01/2018   Procedure: CYSTOSCOPY WITH RETROGRADE PYELOGRAM/URETERAL STENT PLACEMENT;  Surgeon: Ceasar Mons, MD;  Location: [facility];  Service: Urology;  Laterality: Left;  . CYSTOSCOPY/URETEROSCOPY/HOLMIUM LASER/STENT PLACEMENT Left 09/17/2018   Procedure: CYSTOSCOPY/URETEROSCOPY/HOLMIUM LASER/STENT PLACEMENT;  Surgeon: Ceasar Mons, MD;  Location: [facility];  Service: Urology;  Laterality: Left;  ONLY NEEDS 45 MIN  . OVARIAN CYST SURGERY Left   . POLYPECTOMY  05/12/2018   Procedure: POLYPECTOMY;  Surgeon: Daneil Dolin, MD;  Location: [facility];  Service: Endoscopy;;  polyp at splenic flexure  . TEMPOROMANDIBULAR JOINT SURGERY     9 surgeries  . TUBAL LIGATION    .  vaginal childbirth     1   Family History  Problem Relation Age of Onset  . Anxiety disorder Mother   . Hypertension Mother   . Heart failure Mother   . Stroke Mother   . Hyperlipidemia Father   . Diabetes Father   . Stroke Father   . Hypertension Father   . Colon polyps Father        older than 54  . Diabetes Sister   . Hypertension Sister   . Ovarian cancer Sister   . Asthma Daughter   . Ovarian cancer Maternal Grandmother   . Colon cancer Maternal Grandmother   . Renal Disease Paternal Grandmother   . Heart attack Paternal Grandfather   . Bipolar disorder Other     Current Outpatient Medications:  .  albuterol (PROVENTIL HFA;VENTOLIN HFA) 108 (90 Base) MCG/ACT inhaler, Inhale 1 puff into the lungs every 6 (six) hours as needed for wheezing or shortness of breath., Disp: , Rfl:  .  aspirin 325 MG tablet, Take 1 tablet by mouth daily., Disp: , Rfl:  .  atorvastatin (LIPITOR) 80 MG tablet, Take 80 mg by mouth daily., Disp: , Rfl:  .  CALCIUM CITRATE-VITAMIN D3 PO, Take 1 tablet by mouth daily., Disp: , Rfl:  .  Cariprazine HCl (VRAYLAR) 6 MG CAPS, Take 1 capsule (6 mg total) by mouth daily., Disp: 90 capsule, Rfl: 1 .  Cholecalciferol (VITAMIN D3) 5000 units TABS, Take 5,000 Units by mouth daily. , Disp: , Rfl:  .  clonazePAM (KLONOPIN) 1 MG tablet, Take 1 tablet (1 mg total) by mouth 2 (two) times daily., Disp: 60 tablet, Rfl: 2 .  Dexlansoprazole (DEXILANT) 30 MG capsule, Take 30 mg by mouth daily., Disp: , Rfl:  .  famotidine (PEPCID) 20 MG tablet, Take 20 mg by mouth daily., Disp: , Rfl:  .  fluticasone (FLONASE) 50 MCG/ACT nasal spray, SPRAY 2 SPRAYS INTO EACH NOSTRIL EVERY DAY, Disp: 48 mL, Rfl: 0 .  fluticasone (FLOVENT HFA) 220 MCG/ACT inhaler, Inhale 1 puff into the lungs daily. , Disp: , Rfl:  .  lamoTRIgine (LAMICTAL) 100 MG tablet, Take 3 tablets (300 mg total) by mouth at bedtime., Disp: 270 tablet, Rfl: 1 .  levothyroxine (SYNTHROID) 50 MCG tablet, Take 1 tablet  (50 mcg total) by mouth daily., Disp: 90 tablet, Rfl: 1 .  loratadine (CLARITIN) 10 MG tablet, TAKE 1 TABLET BY MOUTH EVERY DAY, Disp: 30 tablet, Rfl: 5 .  magnesium gluconate (MAGONATE) 500 MG tablet, Take 500 mg by mouth daily., Disp: , Rfl:  .  Multiple Vitamins-Minerals (MULTIVITAMIN WOMEN PO), Take 1 tablet by mouth daily., Disp: , Rfl:  .  QUEtiapine (SEROQUEL) 300 MG tablet, Take 1 tablet (  300 mg total) by mouth at bedtime., Disp: 90 tablet, Rfl: 1 .  venlafaxine XR (EFFEXOR-XR) 150 MG 24 hr capsule, Take 2 capsules (300 mg total) by mouth daily., Disp: 180 capsule, Rfl: 1  Allergies  Allergen Reactions  . Lithium Other (See Comments)    Psoriasis      ROS: Review of Systems Pertinent items noted in HPI and remainder of comprehensive ROS otherwise negative.    Physical exam BP 117/83   Pulse 87   Temp (!) 96.7 F (35.9 C)   Ht 5\' 6"  (1.676 m)   Wt 197 lb 9.6 oz (89.6 kg)   LMP 12/13/2017 (Approximate)   SpO2 96%   BMI 31.89 kg/m  General appearance: alert, cooperative, appears stated age and no distress Head: atraumatic, flattening of the right nasolabial fold Eyes: negative findings: lids and lashes normal, conjunctivae and sclerae normal, corneas clear and pupils equal, round, reactive to light and accomodation Ears: normal TM's and external ear canals both ears Nose: Nares normal. Septum midline. Mucosa normal. No drainage or sinus tenderness. Throat: lips, mucosa, and tongue normal; teeth and gums normal Neck: no adenopathy, supple, symmetrical, trachea midline and thyroid not enlarged, symmetric, no tenderness/mass/nodules Back: symmetric, no curvature. ROM normal. No CVA tenderness. Lungs: clear to auscultation bilaterally Heart: regular rate and rhythm, S1, S2 normal, no murmur, click, rub or gallop Abdomen: soft, non-tender; bowel sounds normal; no masses,  no organomegaly Pelvic: cervix normal in appearance, external genitalia normal, no adnexal masses or  tenderness, rectovaginal septum normal and uterus normal size, shape, and consistency Extremities: extremities normal, atraumatic, no cyanosis or edema Pulses: 2+ and symmetric Skin: Skin color, texture, turgor normal. No rashes or lesions Lymph nodes: Cervical, supraclavicular, and axillary nodes normal. Neurologic: LLE with slight increased reflexes (2+/4) compared to RLE (2/4); right nasolabial flattening as above.  She also has deviation of the tongue to the right. Psych: Mood stable, speech normal, affect appropriate, pleasant and   Assessment/ Plan: [name] here for annual physical exam.   1. Annual physical exam  2. Screening for malignant neoplasm of cervix - IGP, Aptima HPV  3. OSA treated with BiPAP New referral placed for BiPAP settings.  Perhaps they can do a video visit since they were the ones that read her sleep study - Ambulatory referral to Neurology  4. Obesity (BMI 30-39.9)  5. Late effect of cerebrovascular accident (CVA) She has ongoing speech difficulty, deviation to right of the tongue and flattening of the right nasolabial fold.  Sensory and mobility has gone back to normal   Handout on healthy lifestyle choices, including diet (rich in fruits, vegetables and lean meats and low in salt and simple carbohydrates) and exercise (at least 30 minutes of moderate physical activity daily).   Dayle Mcnerney M. Lajuana Ripple, DO

## 2019-09-30 NOTE — Patient Instructions (Signed)

## 2019-10-01 DIAGNOSIS — I675 Moyamoya disease: Secondary | ICD-10-CM | POA: Diagnosis not present

## 2019-10-01 NOTE — Telephone Encounter (Signed)
LM again for a return call regarding order and what is needed

## 2019-10-02 LAB — IGP, APTIMA HPV: HPV Aptima: NEGATIVE

## 2019-10-06 DIAGNOSIS — I69320 Aphasia following cerebral infarction: Secondary | ICD-10-CM | POA: Diagnosis not present

## 2019-10-06 DIAGNOSIS — N390 Urinary tract infection, site not specified: Secondary | ICD-10-CM | POA: Diagnosis not present

## 2019-10-06 DIAGNOSIS — B9620 Unspecified Escherichia coli [E. coli] as the cause of diseases classified elsewhere: Secondary | ICD-10-CM | POA: Diagnosis not present

## 2019-10-06 DIAGNOSIS — Z720 Tobacco use: Secondary | ICD-10-CM | POA: Diagnosis not present

## 2019-10-06 DIAGNOSIS — Z7982 Long term (current) use of aspirin: Secondary | ICD-10-CM | POA: Diagnosis not present

## 2019-10-06 DIAGNOSIS — I69351 Hemiplegia and hemiparesis following cerebral infarction affecting right dominant side: Secondary | ICD-10-CM | POA: Diagnosis not present

## 2019-10-06 DIAGNOSIS — D751 Secondary polycythemia: Secondary | ICD-10-CM | POA: Diagnosis not present

## 2019-10-06 DIAGNOSIS — N1831 Chronic kidney disease, stage 3a: Secondary | ICD-10-CM | POA: Diagnosis not present

## 2019-10-06 DIAGNOSIS — J9811 Atelectasis: Secondary | ICD-10-CM | POA: Diagnosis not present

## 2019-10-06 DIAGNOSIS — J449 Chronic obstructive pulmonary disease, unspecified: Secondary | ICD-10-CM | POA: Diagnosis not present

## 2019-10-07 ENCOUNTER — Other Ambulatory Visit: Payer: Self-pay

## 2019-10-07 ENCOUNTER — Encounter: Payer: Self-pay | Admitting: Nutrition

## 2019-10-07 ENCOUNTER — Encounter: Payer: Medicare Other | Attending: Family Medicine | Admitting: Nutrition

## 2019-10-07 DIAGNOSIS — R030 Elevated blood-pressure reading, without diagnosis of hypertension: Secondary | ICD-10-CM

## 2019-10-07 DIAGNOSIS — F3178 Bipolar disorder, in full remission, most recent episode mixed: Secondary | ICD-10-CM

## 2019-10-07 DIAGNOSIS — E669 Obesity, unspecified: Secondary | ICD-10-CM | POA: Insufficient documentation

## 2019-10-07 NOTE — Patient Instructions (Signed)
Goals Follow My Plate Eat three balanced meals  per day Increase fresh fruits and vegetables. Continue to work out after being released from neurologist. Keep drinking water DO not skip meals. Lose 1/2/-1 lb per week

## 2019-10-07 NOTE — Progress Notes (Signed)
°  Medical Nutrition Therapy:  Appt start time: [id number] end time:  1630.   Assessment:  Primary concerns today: Overweight. Has history of bipolar.   LIves with her husband and his mom. She cooks an shops. Use to  live in [place] state 2-3 yrs ago and weighed about 130 lbs about 7 years ago. Eats 1-2 meals per day. Activity: goes to gym  4 days per week with her daughter. Has been gaining weight  for about a year.  Going to see surgeon for her cancer. Willing to make changes. Use to being more active. Current diet is excessive in processed foods; calories, fat , sodium and low in fresh fruits, vegetables.   Preferred Learning Style:  .  No preference indicated   Learning Readiness:  Ready  Change in progress   MEDICATIONS:   DIETARY INTAKE:    24-hr recall:  B ( AM): egg sandwich,  Snk ( AM):   L ( PM): yogurt and protein shake, water Snk ( PM):  D ( PM): chicken, rice, zucchini, carrots, onions, water Snk ( PM): salty and sweet bar Beverages: water  Usual physical activity: Goes to gym 4-5 times per week.  Estimated energy needs: 1500 calories 170 g carbohydrates 112 g protein 42 g fat  Progress Towards Goal(s):  In progress.   Nutritional Diagnosis:  NB-1.1 Food and nutrition-related knowledge deficit As related to Overweight.  As evidenced by BMI > 30 and diet recall.    Intervention:  Nutrition and  Pre Diabetes education provided on My Plate, CHO counting, meal planning, portion sizes, timing of meals, signs/symptoms and treatment of hyper/hypoglycemia, taking medications as prescribed, benefits of exercising 60 minutes per day and prevention of DM. Benefits of plant based diet.  Goals Follow My Plate Eat three balanced meals  per day Increase fresh fruits and vegetables. Continue to work out after being released from neurologist. Keep drinking water DO not skip meals. Lose 1/2/-1 lb per week  Teaching Method Utilized: Visual Auditory Hands  on  Handouts given during visit include:  The Plate Method  Meal Plan  Weight loss tips   Barriers to learning/adherence to lifestyle change:    Demonstrated degree of understanding via:  Teach Back   Monitoring/Evaluation:  Dietary intake, exercise, , and body weight in 1 month(s).

## 2019-10-08 DIAGNOSIS — J9811 Atelectasis: Secondary | ICD-10-CM | POA: Diagnosis not present

## 2019-10-08 DIAGNOSIS — N390 Urinary tract infection, site not specified: Secondary | ICD-10-CM | POA: Diagnosis not present

## 2019-10-08 DIAGNOSIS — B9620 Unspecified Escherichia coli [E. coli] as the cause of diseases classified elsewhere: Secondary | ICD-10-CM | POA: Diagnosis not present

## 2019-10-08 DIAGNOSIS — Z7982 Long term (current) use of aspirin: Secondary | ICD-10-CM | POA: Diagnosis not present

## 2019-10-08 DIAGNOSIS — D751 Secondary polycythemia: Secondary | ICD-10-CM | POA: Diagnosis not present

## 2019-10-08 DIAGNOSIS — I69351 Hemiplegia and hemiparesis following cerebral infarction affecting right dominant side: Secondary | ICD-10-CM | POA: Diagnosis not present

## 2019-10-08 DIAGNOSIS — N1831 Chronic kidney disease, stage 3a: Secondary | ICD-10-CM | POA: Diagnosis not present

## 2019-10-08 DIAGNOSIS — Z720 Tobacco use: Secondary | ICD-10-CM | POA: Diagnosis not present

## 2019-10-08 DIAGNOSIS — I69320 Aphasia following cerebral infarction: Secondary | ICD-10-CM | POA: Diagnosis not present

## 2019-10-08 DIAGNOSIS — J449 Chronic obstructive pulmonary disease, unspecified: Secondary | ICD-10-CM | POA: Diagnosis not present

## 2019-10-12 ENCOUNTER — Telehealth (HOSPITAL_COMMUNITY): Payer: Self-pay | Admitting: Psychiatry

## 2019-10-12 DIAGNOSIS — I63512 Cerebral infarction due to unspecified occlusion or stenosis of left middle cerebral artery: Secondary | ICD-10-CM | POA: Diagnosis not present

## 2019-10-12 DIAGNOSIS — I675 Moyamoya disease: Secondary | ICD-10-CM | POA: Diagnosis not present

## 2019-10-12 DIAGNOSIS — R928 Other abnormal and inconclusive findings on diagnostic imaging of breast: Secondary | ICD-10-CM

## 2019-10-12 NOTE — Telephone Encounter (Signed)
Spoke with patient and she stated she thought that the provider told her to take both the 300 mg and the 50 mg of her Quetiapine for her Anxiety. Staff informed patient with what provider stated and patient verbalized understanding and agreed to take her Quetiapine the way provider is currently instructing her to take it.

## 2019-10-12 NOTE — Telephone Encounter (Signed)
Received a notification from [place] [facility]. It seems like she filled quetiapine 50 mg from Dr. Angie Fava in April and May in addition to my prescription of quetiapine 300 mg. Could you contact the patient and inquire about this medication? I would advise she continue quetiapine 300 mg (NOT 300+50 mg) at this time.

## 2019-10-14 ENCOUNTER — Other Ambulatory Visit: Payer: Self-pay

## 2019-10-14 ENCOUNTER — Inpatient Hospital Stay (HOSPITAL_COMMUNITY): Payer: Medicare Other | Attending: Hematology | Admitting: Hematology

## 2019-10-14 ENCOUNTER — Inpatient Hospital Stay (HOSPITAL_COMMUNITY): Payer: Medicare Other

## 2019-10-14 ENCOUNTER — Encounter (HOSPITAL_COMMUNITY): Payer: Self-pay | Admitting: Hematology

## 2019-10-14 VITALS — BP 109/70 | HR 81 | Temp 98.1°F | Resp 18 | Wt 201.8 lb

## 2019-10-14 DIAGNOSIS — Z7982 Long term (current) use of aspirin: Secondary | ICD-10-CM | POA: Insufficient documentation

## 2019-10-14 DIAGNOSIS — Z87891 Personal history of nicotine dependence: Secondary | ICD-10-CM | POA: Diagnosis not present

## 2019-10-14 DIAGNOSIS — D751 Secondary polycythemia: Secondary | ICD-10-CM | POA: Insufficient documentation

## 2019-10-14 DIAGNOSIS — Z86010 Personal history of colonic polyps: Secondary | ICD-10-CM | POA: Diagnosis not present

## 2019-10-14 DIAGNOSIS — E785 Hyperlipidemia, unspecified: Secondary | ICD-10-CM | POA: Diagnosis not present

## 2019-10-14 DIAGNOSIS — Z79899 Other long term (current) drug therapy: Secondary | ICD-10-CM | POA: Insufficient documentation

## 2019-10-14 DIAGNOSIS — E119 Type 2 diabetes mellitus without complications: Secondary | ICD-10-CM | POA: Insufficient documentation

## 2019-10-14 DIAGNOSIS — G8191 Hemiplegia, unspecified affecting right dominant side: Secondary | ICD-10-CM | POA: Diagnosis not present

## 2019-10-14 DIAGNOSIS — R7989 Other specified abnormal findings of blood chemistry: Secondary | ICD-10-CM | POA: Diagnosis not present

## 2019-10-14 DIAGNOSIS — G4730 Sleep apnea, unspecified: Secondary | ICD-10-CM | POA: Insufficient documentation

## 2019-10-14 DIAGNOSIS — F419 Anxiety disorder, unspecified: Secondary | ICD-10-CM | POA: Diagnosis not present

## 2019-10-14 DIAGNOSIS — R928 Other abnormal and inconclusive findings on diagnostic imaging of breast: Secondary | ICD-10-CM | POA: Insufficient documentation

## 2019-10-14 DIAGNOSIS — K219 Gastro-esophageal reflux disease without esophagitis: Secondary | ICD-10-CM | POA: Diagnosis not present

## 2019-10-14 DIAGNOSIS — J45909 Unspecified asthma, uncomplicated: Secondary | ICD-10-CM | POA: Diagnosis not present

## 2019-10-14 DIAGNOSIS — Z8041 Family history of malignant neoplasm of ovary: Secondary | ICD-10-CM | POA: Insufficient documentation

## 2019-10-14 DIAGNOSIS — R4701 Aphasia: Secondary | ICD-10-CM | POA: Diagnosis not present

## 2019-10-14 DIAGNOSIS — Z8673 Personal history of transient ischemic attack (TIA), and cerebral infarction without residual deficits: Secondary | ICD-10-CM | POA: Diagnosis not present

## 2019-10-14 DIAGNOSIS — I10 Essential (primary) hypertension: Secondary | ICD-10-CM | POA: Diagnosis not present

## 2019-10-14 DIAGNOSIS — N132 Hydronephrosis with renal and ureteral calculous obstruction: Secondary | ICD-10-CM | POA: Diagnosis not present

## 2019-10-14 DIAGNOSIS — R531 Weakness: Secondary | ICD-10-CM | POA: Insufficient documentation

## 2019-10-14 DIAGNOSIS — E039 Hypothyroidism, unspecified: Secondary | ICD-10-CM | POA: Diagnosis not present

## 2019-10-14 DIAGNOSIS — Z87442 Personal history of urinary calculi: Secondary | ICD-10-CM | POA: Insufficient documentation

## 2019-10-14 LAB — CBC WITH DIFFERENTIAL/PLATELET
Abs Immature Granulocytes: 0.04 10*3/uL (ref 0.00–0.07)
Basophils Absolute: 0.1 10*3/uL (ref 0.0–0.1)
Basophils Relative: 1 %
Eosinophils Absolute: 0 10*3/uL (ref 0.0–0.5)
Eosinophils Relative: 0 %
HCT: 48.1 % — ABNORMAL HIGH (ref 36.0–46.0)
Hemoglobin: 15.8 g/dL — ABNORMAL HIGH (ref 12.0–15.0)
Immature Granulocytes: 1 %
Lymphocytes Relative: 21 %
Lymphs Abs: 1.6 10*3/uL (ref 0.7–4.0)
MCH: 35 pg — ABNORMAL HIGH (ref 26.0–34.0)
MCHC: 32.8 g/dL (ref 30.0–36.0)
MCV: 106.7 fL — ABNORMAL HIGH (ref 80.0–100.0)
Monocytes Absolute: 0.7 10*3/uL (ref 0.1–1.0)
Monocytes Relative: 8 %
Neutro Abs: 5.6 10*3/uL (ref 1.7–7.7)
Neutrophils Relative %: 69 %
Platelets: 210 10*3/uL (ref 150–400)
RBC: 4.51 MIL/uL (ref 3.87–5.11)
RDW: 13.5 % (ref 11.5–15.5)
WBC: 8 10*3/uL (ref 4.0–10.5)
nRBC: 0 % (ref 0.0–0.2)

## 2019-10-14 LAB — IRON AND TIBC
Iron: 68 ug/dL (ref 28–170)
Saturation Ratios: 20 % (ref 10.4–31.8)
TIBC: 337 ug/dL (ref 250–450)
UIBC: 269 ug/dL

## 2019-10-14 LAB — FERRITIN: Ferritin: 39 ng/mL (ref 11–307)

## 2019-10-14 LAB — FOLATE: Folate: 13.9 ng/mL (ref >5.9)

## 2019-10-14 LAB — VITAMIN B12: Vitamin B-12: 1107 pg/mL — ABNORMAL HIGH (ref 180–914)

## 2019-10-14 NOTE — Patient Instructions (Signed)
[place] at [facility] Discharge Instructions  You were seen today by Dr. Delton Coombes. He went over your recent results. Please continue your usual care with your primary care provider. Dr. Delton Coombes will see you back in 3 months for labs and follow up.   Thank you for choosing [place] [facility] to provide your oncology and hematology care.  To afford each patient quality time with our provider, please arrive at least 15 minutes before your scheduled appointment time.   If you have a lab appointment with the [facility] please come in thru the Main Entrance and check in at the main information desk  You need to re-schedule your appointment should you arrive 10 or more minutes late.  We strive to give you quality time with our providers, and arriving late affects you and other patients whose appointments are after yours.  Also, if you no show three or more times for appointments you may be dismissed from the clinic at the providers discretion.     Again, thank you for choosing [facility].  Our hope is that these requests will decrease the amount of time that you wait before being seen by our physicians.       _____________________________________________________________  Should you have questions after your visit to [facility], please contact our office at [phone] between the hours of 8:00 a.m. and 4:30 p.m.  Voicemails left after 4:00 p.m. will not be returned until the following business day.  For prescription refill requests, have your pharmacy contact our office and allow 72 hours.    Cancer Center Support Programs:   > Cancer Support Group  2nd Tuesday of the month 1pm-2pm, Journey Room

## 2019-10-14 NOTE — Progress Notes (Signed)
[place], [place] [address]   CLINIC:  Medical Oncology/Hematology  PCP:  [name], DO [address] [place] [address]  [phone]  REASON FOR VISIT:  Follow-up for erythrocytosis  CURRENT THERAPY: Phlebotomy  INTERVAL HISTORY:  Rebecca Moreno, a 54 y.o. female, returns for routine follow-up for her erythrocytosis. Rebecca Moreno was last seen on 05/27/2019.  She had a stroke 3 weeks ago. She has quit smoking and she still has some difficulty speaking and weakness on the L side. She is improving with speech therapy and PT.   REVIEW OF SYSTEMS:  Review of Systems  Constitutional: Positive for appetite change (mildly decreased) and fatigue (moderate).  Neurological: Positive for extremity weakness (L sided weakness) and speech difficulty (after stroke). Negative for headaches.  All other systems reviewed and are negative.   PAST MEDICAL/SURGICAL HISTORY:  Past Medical History:  Diagnosis Date  . Acute pyelonephritis   . Asthma   . Bipolar disorder ([place])   . Colon polyp   . Complication of anesthesia    "sometimes hard to put to sleep"  . Depression    Bipolar  . Gastric ulcer    around 2013.  stress related  . GERD (gastroesophageal reflux disease)   . History of kidney stones   . Hyperlipidemia   . Hypothyroid   . Polyp, uterus corpus   . Sepsis due to Escherichia coli (E. coli) ([place]) 09/04/2018  . Sleep apnea    no cpap used  . Stroke [facility])    Past Surgical History:  Procedure Laterality Date  . ABLATION ON ENDOMETRIOSIS    . COLONOSCOPY     Per patient, done around 2013 in [place], had polyp and overdue for follow up.  . COLONOSCOPY WITH PROPOFOL N/A 05/12/2018   Procedure: COLONOSCOPY WITH PROPOFOL;  Surgeon: Daneil Dolin, MD;  Location: [facility];  Service: Endoscopy;  Laterality: N/A;  12:00pm  . CYSTOSCOPY W/ URETERAL STENT PLACEMENT Left 09/01/2018   Procedure: CYSTOSCOPY WITH RETROGRADE  PYELOGRAM/URETERAL STENT PLACEMENT;  Surgeon: Ceasar Mons, MD;  Location: [facility];  Service: Urology;  Laterality: Left;  . CYSTOSCOPY/URETEROSCOPY/HOLMIUM LASER/STENT PLACEMENT Left 09/17/2018   Procedure: CYSTOSCOPY/URETEROSCOPY/HOLMIUM LASER/STENT PLACEMENT;  Surgeon: Ceasar Mons, MD;  Location: [facility];  Service: Urology;  Laterality: Left;  ONLY NEEDS 45 MIN  . OVARIAN CYST SURGERY Left   . POLYPECTOMY  05/12/2018   Procedure: POLYPECTOMY;  Surgeon: Daneil Dolin, MD;  Location: [facility];  Service: Endoscopy;;  polyp at splenic flexure  . TEMPOROMANDIBULAR JOINT SURGERY     9 surgeries  . TUBAL LIGATION    . vaginal childbirth     1    SOCIAL HISTORY:  Social History   Socioeconomic History  . Marital status: Married    Spouse name: Not on file  . Number of children: 1  . Years of education: Not on file  . Highest education level: High school graduate  Occupational History  . Occupation: disability  Tobacco Use  . Smoking status: Former Smoker    Packs/day: 0.50    Years: 30.00    Pack years: 15.00    Types: Cigarettes    Quit date: 09/06/2019    Years since quitting: 0.1  . Smokeless tobacco: Never Used  Substance and Sexual Activity  . Alcohol use: Never  . Drug use: Not Currently  . Sexual activity: Not Currently    Birth control/protection: Surgical  Comment: tubal  Other Topics Concern  . Not on file  Social History Narrative  . Not on file   Social Determinants of Health   Financial Resource Strain: Low Risk   . Difficulty of Paying Living Expenses: Not very hard  Food Insecurity: Unknown  . Worried About Charity fundraiser in the Last Year: Never true  . Ran Out of Food in the Last Year: Not on file  Transportation Needs: No Transportation Needs  . Lack of Transportation (Medical): No  . Lack of Transportation (Non-Medical): No  Physical Activity: Sufficiently Active  . Days of Exercise per Week: 5 days  . Minutes of  Exercise per Session: 90 min  Stress:   . Feeling of Stress :   Social Connections: Not Isolated  . Frequency of Communication with Friends and Family: Three times a week  . Frequency of Social Gatherings with Friends and Family: More than three times a week  . Attends Religious Services: 1 to 4 times per year  . Active Member of Clubs or Organizations: Not on file  . Attends Archivist Meetings: More than 4 times per year  . Marital Status: Married  Human resources officer Violence: Not At Risk  . Fear of Current or Ex-Partner: No  . Emotionally Abused: No  . Physically Abused: No  . Sexually Abused: No    FAMILY HISTORY:  Family History  Problem Relation Age of Onset  . Anxiety disorder Mother   . Hypertension Mother   . Heart failure Mother   . Stroke Mother   . Hyperlipidemia Father   . Diabetes Father   . Stroke Father   . Hypertension Father   . Colon polyps Father        older than 52  . Diabetes Sister   . Hypertension Sister   . Ovarian cancer Sister   . Asthma Daughter   . Ovarian cancer Maternal Grandmother   . Colon cancer Maternal Grandmother   . Renal Disease Paternal Grandmother   . Heart attack Paternal Grandfather   . Bipolar disorder Other     CURRENT MEDICATIONS:  Current Outpatient Medications  Medication Sig Dispense Refill  . albuterol (PROVENTIL HFA;VENTOLIN HFA) 108 (90 Base) MCG/ACT inhaler Inhale 1 puff into the lungs every 6 (six) hours as needed for wheezing or shortness of breath.    Marland Kitchen aspirin 325 MG tablet Take 1 tablet by mouth daily.    Marland Kitchen atorvastatin (LIPITOR) 80 MG tablet Take 80 mg by mouth daily.    Marland Kitchen CALCIUM CITRATE-VITAMIN D3 PO Take 1 tablet by mouth daily.    . Cariprazine HCl (VRAYLAR) 6 MG CAPS Take 1 capsule (6 mg total) by mouth daily. 90 capsule 1  . Cholecalciferol (VITAMIN D3) 5000 units TABS Take 5,000 Units by mouth daily.     Marland Kitchen Dexlansoprazole (DEXILANT) 30 MG capsule Take 30 mg by mouth daily.    . famotidine  (PEPCID) 20 MG tablet Take 20 mg by mouth daily.    . fluticasone (FLONASE) 50 MCG/ACT nasal spray SPRAY 2 SPRAYS INTO EACH NOSTRIL EVERY DAY 48 mL 0  . fluticasone (FLOVENT HFA) 220 MCG/ACT inhaler Inhale 1 puff into the lungs daily.     Marland Kitchen lamoTRIgine (LAMICTAL) 100 MG tablet Take 3 tablets (300 mg total) by mouth at bedtime. 270 tablet 1  . levothyroxine (SYNTHROID) 50 MCG tablet Take 1 tablet (50 mcg total) by mouth daily. 90 tablet 1  . loratadine (CLARITIN) 10 MG tablet TAKE 1  TABLET BY MOUTH EVERY DAY 30 tablet 5  . magnesium gluconate (MAGONATE) 500 MG tablet Take 500 mg by mouth daily.    . Multiple Vitamins-Minerals (MULTIVITAMIN WOMEN PO) Take 1 tablet by mouth daily.    . QUEtiapine (SEROQUEL) 300 MG tablet Take 1 tablet (300 mg total) by mouth at bedtime. 90 tablet 1  . venlafaxine XR (EFFEXOR-XR) 150 MG 24 hr capsule Take 2 capsules (300 mg total) by mouth daily. 180 capsule 1   No current facility-administered medications for this visit.    ALLERGIES:  Allergies  Allergen Reactions  . Lithium Other (See Comments)    Psoriasis     PHYSICAL EXAM:  Performance status (ECOG): 1 - Symptomatic but completely ambulatory  Vitals:   10/14/19 1554 10/14/19 1556  BP: 109/70 109/70  Pulse: 81 81  Resp: 18 18  Temp: 98.1 F (36.7 C) 98.1 F (36.7 C)  SpO2: 98% 98%   Wt Readings from Last 3 Encounters:  10/14/19 201 lb 12.8 oz (91.5 kg)  09/30/19 197 lb 9.6 oz (89.6 kg)  09/25/19 200 lb (90.7 kg)   Physical Exam Vitals reviewed.  Constitutional:      Appearance: Normal appearance. She is obese.  Cardiovascular:     Rate and Rhythm: Normal rate and regular rhythm.     Pulses: Normal pulses.     Heart sounds: Normal heart sounds.  Pulmonary:     Effort: Pulmonary effort is normal.     Breath sounds: Normal breath sounds.  Neurological:     General: No focal deficit present.     Mental Status: She is alert and oriented to person, place, and time.     Motor: No  weakness.     Comments: Slight aphasia  Psychiatric:        Mood and Affect: Mood normal.        Behavior: Behavior normal.     LABORATORY DATA:  I have reviewed the labs as listed.  CBC Latest Ref Rng & Units 10/14/2019 05/27/2019 02/26/2019  WBC 4.0 - 10.5 K/uL 8.0 10.0 8.3  Hemoglobin 12.0 - 15.0 g/dL 15.8(H) 17.5(H) 17.8(H)  Hematocrit 36.0 - 46.0 % 48.1(H) 53.1(H) 54.4(H)  Platelets 150 - 400 K/uL 210 203 210   CMP Latest Ref Rng & Units 08/19/2019 01/05/2019 12/18/2018  Glucose 65 - 99 mg/dL 77 82 108(H)  BUN 6 - 24 mg/dL 23 14 13   Creatinine 0.57 - 1.00 mg/dL 0.84 1.17(H) 1.21(H)  Sodium 134 - 144 mmol/L 142 139 141  Potassium 3.5 - 5.2 mmol/L 4.6 4.5 4.1  Chloride 96 - 106 mmol/L 100 101 100  CO2 20 - 29 mmol/L 23 30 26   Calcium 8.7 - 10.2 mg/dL 9.4 9.8 9.5  Total Protein 6.5 - 8.1 g/dL - 7.3 6.2  Total Bilirubin 0.3 - 1.2 mg/dL - 0.4 <0.2  Alkaline Phos 38 - 126 U/L - 122 130(H)  AST 15 - 41 U/L - 21 21  ALT 0 - 44 U/L - 24 24      Component Value Date/Time   RBC 4.51 10/14/2019 1506   MCV 106.7 (H) 10/14/2019 1506   MCV 100 (H) 12/18/2018 1323   MCH 35.0 (H) 10/14/2019 1506   MCHC 32.8 10/14/2019 1506   RDW 13.5 10/14/2019 1506   RDW 13.4 12/18/2018 1323   LYMPHSABS 1.6 10/14/2019 1506   LYMPHSABS 1.9 01/15/2018 1500   MONOABS 0.7 10/14/2019 1506   EOSABS 0.0 10/14/2019 1506   EOSABS 0.2 01/15/2018 1500  BASOSABS 0.1 10/14/2019 1506   BASOSABS 0.1 01/15/2018 1500    DIAGNOSTIC IMAGING:  I have independently reviewed the scans and discussed with the patient.   ASSESSMENT:  1.  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 JAK2 V617F and reflex mutation testing was negative. -Erythropoietin was 15.9. White count and platelet count was normal. -History of chronic headaches. Smoked half pack per day for 40 years. She quit smoking when she had stroke in May of this year. -CT renal study  on 09/01/2018 showed mild left hydroureteronephrosis due to 4 mm mid left ureteral calculus. Tiny left renal calculus. Severe hepatic steatosis. -Phlebotomy on 02/05/2019. -She also has sleep apnea and has not been using CPAP machine consistently.  2. Carboxyhemoglobinemia: -Carboxyhemoglobin was elevated at 18.4. ABG showed PO2 of 51 and PCO2 of 52. Elevated carboxyhemoglobin was out of proportion to her smoking.  3. 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 Right hemiparesis improved as did her speech. She continues to have some word finding difficulties. -MR angiogram of the head and neck region was done on 10/12/2019. She is on aspirin 325 mg daily.   PLAN:  1.  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 Hemoglobin is 15.8 and hematocrit is 48.1. MCV is 106.7. White count and platelet count was normal. -Ferritin was 39, percent saturation was 20, folic acid and F81 was normal. -We will reevaluate her in 3 months with labs. No active intervention needed.  2. Right hemiparesis and aphasia: -Differential diagnosis includes iCAD versus MMD. -Her hematological condition is unrelated to moyamoya disease. -Right hemiparesis has improved. She still has some word finding difficulties although aphasia improved.  3. Abnormal mammogram: -Mammogram on 10/08/2019 shows BI-RADS Category 0. Asymmetry in the lower inner right breast posteriorly. Spot compression views were recommended with true lateral view.  Orders placed this encounter:  No orders of the defined types were placed in this encounter.    Derek Jack, MD [place] [phone]   I, Milinda Antis, am acting as a scribe for Dr. Sanda Linger.  I, Derek Jack MD, have reviewed the above documentation for accuracy and completeness, and  I agree with the above.

## 2019-10-15 DIAGNOSIS — N390 Urinary tract infection, site not specified: Secondary | ICD-10-CM | POA: Diagnosis not present

## 2019-10-15 DIAGNOSIS — J9811 Atelectasis: Secondary | ICD-10-CM | POA: Diagnosis not present

## 2019-10-15 DIAGNOSIS — Z7982 Long term (current) use of aspirin: Secondary | ICD-10-CM | POA: Diagnosis not present

## 2019-10-15 DIAGNOSIS — B9620 Unspecified Escherichia coli [E. coli] as the cause of diseases classified elsewhere: Secondary | ICD-10-CM | POA: Diagnosis not present

## 2019-10-15 DIAGNOSIS — D751 Secondary polycythemia: Secondary | ICD-10-CM | POA: Diagnosis not present

## 2019-10-15 DIAGNOSIS — I69351 Hemiplegia and hemiparesis following cerebral infarction affecting right dominant side: Secondary | ICD-10-CM | POA: Diagnosis not present

## 2019-10-15 DIAGNOSIS — N1831 Chronic kidney disease, stage 3a: Secondary | ICD-10-CM | POA: Diagnosis not present

## 2019-10-15 DIAGNOSIS — I69320 Aphasia following cerebral infarction: Secondary | ICD-10-CM | POA: Diagnosis not present

## 2019-10-15 DIAGNOSIS — Z720 Tobacco use: Secondary | ICD-10-CM | POA: Diagnosis not present

## 2019-10-15 DIAGNOSIS — J449 Chronic obstructive pulmonary disease, unspecified: Secondary | ICD-10-CM | POA: Diagnosis not present

## 2019-10-16 DIAGNOSIS — J9811 Atelectasis: Secondary | ICD-10-CM | POA: Diagnosis not present

## 2019-10-16 DIAGNOSIS — I69351 Hemiplegia and hemiparesis following cerebral infarction affecting right dominant side: Secondary | ICD-10-CM | POA: Diagnosis not present

## 2019-10-16 DIAGNOSIS — N1831 Chronic kidney disease, stage 3a: Secondary | ICD-10-CM | POA: Diagnosis not present

## 2019-10-16 DIAGNOSIS — D751 Secondary polycythemia: Secondary | ICD-10-CM | POA: Diagnosis not present

## 2019-10-16 DIAGNOSIS — J449 Chronic obstructive pulmonary disease, unspecified: Secondary | ICD-10-CM | POA: Diagnosis not present

## 2019-10-16 DIAGNOSIS — B9620 Unspecified Escherichia coli [E. coli] as the cause of diseases classified elsewhere: Secondary | ICD-10-CM | POA: Diagnosis not present

## 2019-10-16 DIAGNOSIS — N390 Urinary tract infection, site not specified: Secondary | ICD-10-CM | POA: Diagnosis not present

## 2019-10-16 DIAGNOSIS — Z7982 Long term (current) use of aspirin: Secondary | ICD-10-CM | POA: Diagnosis not present

## 2019-10-16 DIAGNOSIS — Z720 Tobacco use: Secondary | ICD-10-CM | POA: Diagnosis not present

## 2019-10-16 DIAGNOSIS — I69320 Aphasia following cerebral infarction: Secondary | ICD-10-CM | POA: Diagnosis not present

## 2019-10-18 ENCOUNTER — Other Ambulatory Visit: Payer: Self-pay | Admitting: Family Medicine

## 2019-10-18 DIAGNOSIS — J301 Allergic rhinitis due to pollen: Secondary | ICD-10-CM

## 2019-10-18 LAB — METHYLMALONIC ACID, SERUM: Methylmalonic Acid, Quantitative: 218 nmol/L (ref 0–378)

## 2019-10-20 DIAGNOSIS — R928 Other abnormal and inconclusive findings on diagnostic imaging of breast: Secondary | ICD-10-CM | POA: Diagnosis not present

## 2019-10-22 ENCOUNTER — Other Ambulatory Visit: Payer: Self-pay | Admitting: Family Medicine

## 2019-10-22 DIAGNOSIS — J301 Allergic rhinitis due to pollen: Secondary | ICD-10-CM

## 2019-10-28 DIAGNOSIS — I63512 Cerebral infarction due to unspecified occlusion or stenosis of left middle cerebral artery: Secondary | ICD-10-CM | POA: Diagnosis not present

## 2019-10-28 DIAGNOSIS — I675 Moyamoya disease: Secondary | ICD-10-CM | POA: Diagnosis not present

## 2019-10-29 DIAGNOSIS — I675 Moyamoya disease: Secondary | ICD-10-CM | POA: Diagnosis not present

## 2019-10-29 DIAGNOSIS — R930 Abnormal findings on diagnostic imaging of skull and head, not elsewhere classified: Secondary | ICD-10-CM | POA: Diagnosis not present

## 2019-10-30 DIAGNOSIS — J9811 Atelectasis: Secondary | ICD-10-CM | POA: Diagnosis not present

## 2019-10-30 DIAGNOSIS — B9620 Unspecified Escherichia coli [E. coli] as the cause of diseases classified elsewhere: Secondary | ICD-10-CM | POA: Diagnosis not present

## 2019-10-30 DIAGNOSIS — I69320 Aphasia following cerebral infarction: Secondary | ICD-10-CM | POA: Diagnosis not present

## 2019-10-30 DIAGNOSIS — Z720 Tobacco use: Secondary | ICD-10-CM | POA: Diagnosis not present

## 2019-10-30 DIAGNOSIS — N390 Urinary tract infection, site not specified: Secondary | ICD-10-CM | POA: Diagnosis not present

## 2019-10-30 DIAGNOSIS — N1831 Chronic kidney disease, stage 3a: Secondary | ICD-10-CM | POA: Diagnosis not present

## 2019-10-30 DIAGNOSIS — Z7982 Long term (current) use of aspirin: Secondary | ICD-10-CM | POA: Diagnosis not present

## 2019-10-30 DIAGNOSIS — D751 Secondary polycythemia: Secondary | ICD-10-CM | POA: Diagnosis not present

## 2019-10-30 DIAGNOSIS — I69351 Hemiplegia and hemiparesis following cerebral infarction affecting right dominant side: Secondary | ICD-10-CM | POA: Diagnosis not present

## 2019-10-30 DIAGNOSIS — J449 Chronic obstructive pulmonary disease, unspecified: Secondary | ICD-10-CM | POA: Diagnosis not present

## 2019-11-02 DIAGNOSIS — R0902 Hypoxemia: Secondary | ICD-10-CM | POA: Diagnosis not present

## 2019-11-02 DIAGNOSIS — R5383 Other fatigue: Secondary | ICD-10-CM | POA: Diagnosis not present

## 2019-11-02 DIAGNOSIS — G4733 Obstructive sleep apnea (adult) (pediatric): Secondary | ICD-10-CM | POA: Diagnosis not present

## 2019-11-02 DIAGNOSIS — G2581 Restless legs syndrome: Secondary | ICD-10-CM | POA: Diagnosis not present

## 2019-11-03 DIAGNOSIS — I69351 Hemiplegia and hemiparesis following cerebral infarction affecting right dominant side: Secondary | ICD-10-CM | POA: Diagnosis not present

## 2019-11-03 DIAGNOSIS — N1831 Chronic kidney disease, stage 3a: Secondary | ICD-10-CM | POA: Diagnosis not present

## 2019-11-03 DIAGNOSIS — D751 Secondary polycythemia: Secondary | ICD-10-CM | POA: Diagnosis not present

## 2019-11-03 DIAGNOSIS — J9811 Atelectasis: Secondary | ICD-10-CM | POA: Diagnosis not present

## 2019-11-03 DIAGNOSIS — J449 Chronic obstructive pulmonary disease, unspecified: Secondary | ICD-10-CM | POA: Diagnosis not present

## 2019-11-03 DIAGNOSIS — I69320 Aphasia following cerebral infarction: Secondary | ICD-10-CM | POA: Diagnosis not present

## 2019-11-03 DIAGNOSIS — Z720 Tobacco use: Secondary | ICD-10-CM | POA: Diagnosis not present

## 2019-11-03 DIAGNOSIS — B9620 Unspecified Escherichia coli [E. coli] as the cause of diseases classified elsewhere: Secondary | ICD-10-CM | POA: Diagnosis not present

## 2019-11-03 DIAGNOSIS — Z7982 Long term (current) use of aspirin: Secondary | ICD-10-CM | POA: Diagnosis not present

## 2019-11-03 DIAGNOSIS — N390 Urinary tract infection, site not specified: Secondary | ICD-10-CM | POA: Diagnosis not present

## 2019-11-04 ENCOUNTER — Encounter: Payer: Self-pay | Admitting: Nutrition

## 2019-11-04 DIAGNOSIS — D751 Secondary polycythemia: Secondary | ICD-10-CM | POA: Diagnosis not present

## 2019-11-04 DIAGNOSIS — Z8673 Personal history of transient ischemic attack (TIA), and cerebral infarction without residual deficits: Secondary | ICD-10-CM | POA: Diagnosis not present

## 2019-11-04 DIAGNOSIS — I63232 Cerebral infarction due to unspecified occlusion or stenosis of left carotid arteries: Secondary | ICD-10-CM | POA: Diagnosis not present

## 2019-11-04 DIAGNOSIS — R030 Elevated blood-pressure reading, without diagnosis of hypertension: Secondary | ICD-10-CM | POA: Diagnosis not present

## 2019-11-04 DIAGNOSIS — G2581 Restless legs syndrome: Secondary | ICD-10-CM | POA: Diagnosis not present

## 2019-11-05 ENCOUNTER — Other Ambulatory Visit: Payer: Self-pay | Admitting: Family Medicine

## 2019-11-05 DIAGNOSIS — Z79899 Other long term (current) drug therapy: Secondary | ICD-10-CM | POA: Diagnosis not present

## 2019-11-05 DIAGNOSIS — Z7982 Long term (current) use of aspirin: Secondary | ICD-10-CM | POA: Diagnosis not present

## 2019-11-05 DIAGNOSIS — Z8673 Personal history of transient ischemic attack (TIA), and cerebral infarction without residual deficits: Secondary | ICD-10-CM | POA: Diagnosis not present

## 2019-11-05 DIAGNOSIS — Z888 Allergy status to other drugs, medicaments and biological substances status: Secondary | ICD-10-CM | POA: Diagnosis not present

## 2019-11-05 DIAGNOSIS — I675 Moyamoya disease: Secondary | ICD-10-CM | POA: Diagnosis not present

## 2019-11-05 DIAGNOSIS — I6602 Occlusion and stenosis of left middle cerebral artery: Secondary | ICD-10-CM | POA: Diagnosis not present

## 2019-11-11 DIAGNOSIS — I672 Cerebral atherosclerosis: Secondary | ICD-10-CM | POA: Diagnosis not present

## 2019-11-12 DIAGNOSIS — J9811 Atelectasis: Secondary | ICD-10-CM | POA: Diagnosis not present

## 2019-11-12 DIAGNOSIS — I69320 Aphasia following cerebral infarction: Secondary | ICD-10-CM | POA: Diagnosis not present

## 2019-11-12 DIAGNOSIS — N1831 Chronic kidney disease, stage 3a: Secondary | ICD-10-CM | POA: Diagnosis not present

## 2019-11-12 DIAGNOSIS — Z720 Tobacco use: Secondary | ICD-10-CM | POA: Diagnosis not present

## 2019-11-12 DIAGNOSIS — B9620 Unspecified Escherichia coli [E. coli] as the cause of diseases classified elsewhere: Secondary | ICD-10-CM | POA: Diagnosis not present

## 2019-11-12 DIAGNOSIS — Z7982 Long term (current) use of aspirin: Secondary | ICD-10-CM | POA: Diagnosis not present

## 2019-11-12 DIAGNOSIS — D751 Secondary polycythemia: Secondary | ICD-10-CM | POA: Diagnosis not present

## 2019-11-12 DIAGNOSIS — N390 Urinary tract infection, site not specified: Secondary | ICD-10-CM | POA: Diagnosis not present

## 2019-11-12 DIAGNOSIS — J449 Chronic obstructive pulmonary disease, unspecified: Secondary | ICD-10-CM | POA: Diagnosis not present

## 2019-11-12 DIAGNOSIS — I69351 Hemiplegia and hemiparesis following cerebral infarction affecting right dominant side: Secondary | ICD-10-CM | POA: Diagnosis not present

## 2019-11-16 DIAGNOSIS — J449 Chronic obstructive pulmonary disease, unspecified: Secondary | ICD-10-CM | POA: Diagnosis not present

## 2019-11-16 DIAGNOSIS — I69320 Aphasia following cerebral infarction: Secondary | ICD-10-CM | POA: Diagnosis not present

## 2019-11-16 DIAGNOSIS — Z720 Tobacco use: Secondary | ICD-10-CM | POA: Diagnosis not present

## 2019-11-16 DIAGNOSIS — N1831 Chronic kidney disease, stage 3a: Secondary | ICD-10-CM | POA: Diagnosis not present

## 2019-11-16 DIAGNOSIS — I69351 Hemiplegia and hemiparesis following cerebral infarction affecting right dominant side: Secondary | ICD-10-CM | POA: Diagnosis not present

## 2019-11-16 DIAGNOSIS — N390 Urinary tract infection, site not specified: Secondary | ICD-10-CM | POA: Diagnosis not present

## 2019-11-16 DIAGNOSIS — J9811 Atelectasis: Secondary | ICD-10-CM | POA: Diagnosis not present

## 2019-11-16 DIAGNOSIS — Z7982 Long term (current) use of aspirin: Secondary | ICD-10-CM | POA: Diagnosis not present

## 2019-11-16 DIAGNOSIS — B9620 Unspecified Escherichia coli [E. coli] as the cause of diseases classified elsewhere: Secondary | ICD-10-CM | POA: Diagnosis not present

## 2019-11-16 DIAGNOSIS — D751 Secondary polycythemia: Secondary | ICD-10-CM | POA: Diagnosis not present

## 2019-11-18 DIAGNOSIS — B9620 Unspecified Escherichia coli [E. coli] as the cause of diseases classified elsewhere: Secondary | ICD-10-CM | POA: Diagnosis not present

## 2019-11-18 DIAGNOSIS — I69351 Hemiplegia and hemiparesis following cerebral infarction affecting right dominant side: Secondary | ICD-10-CM | POA: Diagnosis not present

## 2019-11-18 DIAGNOSIS — I69320 Aphasia following cerebral infarction: Secondary | ICD-10-CM | POA: Diagnosis not present

## 2019-11-18 DIAGNOSIS — D751 Secondary polycythemia: Secondary | ICD-10-CM | POA: Diagnosis not present

## 2019-11-18 DIAGNOSIS — Z720 Tobacco use: Secondary | ICD-10-CM | POA: Diagnosis not present

## 2019-11-18 DIAGNOSIS — Z7982 Long term (current) use of aspirin: Secondary | ICD-10-CM | POA: Diagnosis not present

## 2019-11-18 DIAGNOSIS — J9811 Atelectasis: Secondary | ICD-10-CM | POA: Diagnosis not present

## 2019-11-18 DIAGNOSIS — N390 Urinary tract infection, site not specified: Secondary | ICD-10-CM | POA: Diagnosis not present

## 2019-11-18 DIAGNOSIS — J449 Chronic obstructive pulmonary disease, unspecified: Secondary | ICD-10-CM | POA: Diagnosis not present

## 2019-11-18 DIAGNOSIS — N1831 Chronic kidney disease, stage 3a: Secondary | ICD-10-CM | POA: Diagnosis not present

## 2019-11-20 NOTE — Progress Notes (Signed)
Virtual Visit via Telephone Note  I connected with Rebecca Moreno on 11/26/19 at  1:00 PM EDT by telephone and verified that I am speaking with the correct person using two identifiers.   I discussed the limitations, risks, security and privacy concerns of performing an evaluation and management service by telephone and the availability of in person appointments. I also discussed with the patient that there may be a patient responsible charge related to this service. The patient expressed understanding and agreed to proceed.   I discussed the assessment and treatment plan with the patient. The patient was provided an opportunity to ask questions and all were answered. The patient agreed with the plan and demonstrated an understanding of the instructions.   The patient was advised to call back or seek an in-person evaluation if the symptoms worsen or if the condition fails to improve as anticipated.  Location: patient- home, provider- office   I provided 12 minutes of non-face-to-face time during this encounter.   Norman Clay, MD     [facility] MD/PA/NP OP Progress Note  11/26/2019 1:23 PM Rebecca Moreno  MRN:  [id number]  Chief Complaint:  Chief Complaint    Depression; Follow-up     HPI:  This is a follow-up appointment for depression and mood disorder.  She states that her PCP prescribed quetiapine 50 mg in addition to 300 mg, although she had "no idea" why those medication were prescribed.  She has been taking care of her mother-in-law with dementia. She felt elated when her daughter got married in June. She is planning to stay with her family on her upcoming birthday.  She reports "alright" relationship with her husband. She enjoys going to gym and does regular exercise 4 times a week.  She enjoys meeting with her family and friends. She declined to taper down medication, and asks the medication to be continued at the same dose.  She has occasional insomnia.  She was evaluated for sleep  study, and was told she does not have sleep apnea, although she may benefit from nose piece given low oxygen.  She feels fatigue.  She has fair concentration.  She has decreased appetite.  She denies SI.  She feels anxious and tense for the time.  She denies decreased need for sleep or euphoria.   Household: husband, mother in law with dementia   Visit Diagnosis:    ICD-10-CM   1. Mood disorder (HCC)  F39 Cariprazine HCl (VRAYLAR) 6 MG CAPS    lamoTRIgine (LAMICTAL) 100 MG tablet    venlafaxine XR (EFFEXOR-XR) 150 MG 24 hr capsule    QUEtiapine (SEROQUEL) 300 MG tablet    Past Psychiatric History: Please see initial evaluation for full details. I have reviewed the history. No updates at this time.     Past Medical History:  Past Medical History:  Diagnosis Date  . Acute pyelonephritis   . Asthma   . Bipolar disorder ([place])   . Colon polyp   . Complication of anesthesia    "sometimes hard to put to sleep"  . Depression    Bipolar  . Gastric ulcer    around 2013.  stress related  . GERD (gastroesophageal reflux disease)   . History of kidney stones   . Hyperlipidemia   . Hypothyroid   . Polyp, uterus corpus   . Sepsis due to Escherichia coli (E. coli) ([place]) 09/04/2018  . Sleep apnea    no cpap used  . Stroke [facility])  Past Surgical History:  Procedure Laterality Date  . ABLATION ON ENDOMETRIOSIS    . COLONOSCOPY     Per patient, done around 2013 in [place], had polyp and overdue for follow up.  . COLONOSCOPY WITH PROPOFOL N/A 05/12/2018   Procedure: COLONOSCOPY WITH PROPOFOL;  Surgeon: Daneil Dolin, MD;  Location: [facility];  Service: Endoscopy;  Laterality: N/A;  12:00pm  . CYSTOSCOPY W/ URETERAL STENT PLACEMENT Left 09/01/2018   Procedure: CYSTOSCOPY WITH RETROGRADE PYELOGRAM/URETERAL STENT PLACEMENT;  Surgeon: Ceasar Mons, MD;  Location: [facility] ORS;  Service: Urology;  Laterality: Left;  . CYSTOSCOPY/URETEROSCOPY/HOLMIUM LASER/STENT PLACEMENT Left  09/17/2018   Procedure: CYSTOSCOPY/URETEROSCOPY/HOLMIUM LASER/STENT PLACEMENT;  Surgeon: Ceasar Mons, MD;  Location: [facility];  Service: Urology;  Laterality: Left;  ONLY NEEDS 45 MIN  . OVARIAN CYST SURGERY Left   . POLYPECTOMY  05/12/2018   Procedure: POLYPECTOMY;  Surgeon: Daneil Dolin, MD;  Location: [facility];  Service: Endoscopy;;  polyp at splenic flexure  . TEMPOROMANDIBULAR JOINT SURGERY     9 surgeries  . TUBAL LIGATION    . vaginal childbirth     1    Family Psychiatric History: Please see initial evaluation for full details. I have reviewed the history. No updates at this time.     Family History:  Family History  Problem Relation Age of Onset  . Anxiety disorder Mother   . Hypertension Mother   . Heart failure Mother   . Stroke Mother   . Hyperlipidemia Father   . Diabetes Father   . Stroke Father   . Hypertension Father   . Colon polyps Father        older than 56  . Diabetes Sister   . Hypertension Sister   . Ovarian cancer Sister   . Asthma Daughter   . Ovarian cancer Maternal Grandmother   . Colon cancer Maternal Grandmother   . Renal Disease Paternal Grandmother   . Heart attack Paternal Grandfather   . Bipolar disorder Other     Social History:  Social History   Socioeconomic History  . Marital status: Married    Spouse name: Not on file  . Number of children: 1  . Years of education: Not on file  . Highest education level: High school graduate  Occupational History  . Occupation: disability  Tobacco Use  . Smoking status: Former Smoker    Packs/day: 0.50    Years: 30.00    Pack years: 15.00    Types: Cigarettes    Quit date: 09/06/2019    Years since quitting: 0.2  . Smokeless tobacco: Never Used  Vaping Use  . Vaping Use: Never used  Substance and Sexual Activity  . Alcohol use: Never  . Drug use: Not Currently  . Sexual activity: Not Currently    Birth control/protection: Surgical    Comment: tubal  Other Topics  Concern  . Not on file  Social History Narrative  . Not on file   Social Determinants of Health   Financial Resource Strain: Low Risk   . Difficulty of Paying Living Expenses: Not very hard  Food Insecurity: Unknown  . Worried About Charity fundraiser in the Last Year: Never true  . Ran Out of Food in the Last Year: Not on file  Transportation Needs: No Transportation Needs  . Lack of Transportation (Medical): No  . Lack of Transportation (Non-Medical): No  Physical Activity: Sufficiently Active  . Days of Exercise per Week: 5 days  .  Minutes of Exercise per Session: 90 min  Stress:   . Feeling of Stress :   Social Connections: Socially Integrated  . Frequency of Communication with Friends and Family: Three times a week  . Frequency of Social Gatherings with Friends and Family: More than three times a week  . Attends Religious Services: 1 to 4 times per year  . Active Member of Clubs or Organizations: Not on file  . Attends Archivist Meetings: More than 4 times per year  . Marital Status: Married    Allergies:  Allergies  Allergen Reactions  . Lithium Other (See Comments)    Psoriasis     Metabolic Disorder Labs: No results found for: HGBA1C, MPG No results found for: PROLACTIN Lab Results  Component Value Date   CHOL 212 (H) 12/18/2018   TRIG 171 (H) 12/18/2018   HDL 44 12/18/2018   CHOLHDL 4.8 (H) 12/18/2018   LDLCALC 134 (H) 12/18/2018   LDLCALC 168 (H) 01/15/2018   Lab Results  Component Value Date   TSH 2.580 08/19/2019   TSH 1.480 12/18/2018    Therapeutic Level Labs: No results found for: LITHIUM No results found for: VALPROATE No components found for:  CBMZ  Current Medications: Current Outpatient Medications  Medication Sig Dispense Refill  . albuterol (PROVENTIL HFA;VENTOLIN HFA) 108 (90 Base) MCG/ACT inhaler Inhale 1 puff into the lungs every 6 (six) hours as needed for wheezing or shortness of breath.    Marland Kitchen aspirin 325 MG tablet  Take 1 tablet by mouth daily.    Marland Kitchen atorvastatin (LIPITOR) 80 MG tablet TAKE ONE TABLET BY MOUTH AT BEDTIME. 30 tablet 2  . CALCIUM CITRATE-VITAMIN D3 PO Take 1 tablet by mouth daily.    . Cariprazine HCl (VRAYLAR) 6 MG CAPS Take 1 capsule (6 mg total) by mouth daily. 90 capsule 0  . Cholecalciferol (VITAMIN D3) 5000 units TABS Take 5,000 Units by mouth daily.     Derrill Memo ON 01/19/2020] clonazePAM (KLONOPIN) 1 MG tablet Take 1 tablet (1 mg total) by mouth 2 (two) times daily as needed for anxiety. 60 tablet 1  . Dexlansoprazole (DEXILANT) 30 MG capsule Take 30 mg by mouth daily.    . famotidine (PEPCID) 20 MG tablet Take 20 mg by mouth daily.    . fluticasone (FLONASE) 50 MCG/ACT nasal spray SPRAY 2 SPRAYS INTO EACH NOSTRIL EVERY DAY 48 mL 0  . fluticasone (FLOVENT HFA) 220 MCG/ACT inhaler Inhale 1 puff into the lungs daily.     Marland Kitchen lamoTRIgine (LAMICTAL) 100 MG tablet Take 3 tablets (300 mg total) by mouth at bedtime. 270 tablet 0  . levothyroxine (SYNTHROID) 50 MCG tablet Take 1 tablet (50 mcg total) by mouth daily. 90 tablet 1  . loratadine (CLARITIN) 10 MG tablet TAKE 1 TABLET BY MOUTH EVERY DAY 30 tablet 5  . magnesium gluconate (MAGONATE) 500 MG tablet Take 500 mg by mouth daily.    . Multiple Vitamins-Minerals (MULTIVITAMIN WOMEN PO) Take 1 tablet by mouth daily.    . QUEtiapine (SEROQUEL) 300 MG tablet Take 1 tablet (300 mg total) by mouth at bedtime. 90 tablet 0  . venlafaxine XR (EFFEXOR-XR) 150 MG 24 hr capsule Take 2 capsules (300 mg total) by mouth daily. 180 capsule 0   No current facility-administered medications for this visit.     Musculoskeletal: Strength & Muscle Tone: N/A Gait & Station: N/A Patient leans: N/A  Psychiatric Specialty Exam: Review of Systems  Psychiatric/Behavioral: Positive for dysphoric mood and sleep  disturbance. Negative for agitation, behavioral problems, confusion, decreased concentration, hallucinations, self-injury and suicidal ideas. The patient  is nervous/anxious. The patient is not hyperactive.   All other systems reviewed and are negative.   Last menstrual period 12/13/2017.There is no height or weight on file to calculate BMI.  General Appearance: NA  Eye Contact:  NA  Speech:  Clear and Coherent  Volume:  Normal  Mood:  Depressed  Affect:  NA  Thought Process:  Coherent  Orientation:  Full (Time, Place, and Person)  Thought Content: Logical   Suicidal Thoughts:  No  Homicidal Thoughts:  No  Memory:  Immediate;   Good  Judgement:  Good  Insight:  Fair  Psychomotor Activity:  Normal  Concentration:  Concentration: Good and Attention Span: Good  Recall:  Good  Fund of Knowledge: Good  Language: Good  Akathisia:  No  Handed:  Right  AIMS (if indicated): not done  Assets:  Communication Skills Desire for Improvement  ADL's:  Intact  Cognition: WNL  Sleep:  Fair   Screenings: GAD-7     Office Visit from 08/19/2019 in [place] Visit from 04/16/2018 in [place] [facility]  Total GAD-7 Score 11 3    Mini-Mental     Clinical Support from 07/22/2018 in [place]  Total Score (max 30 points ) 30    PHQ2-9     Nutrition from 10/07/2019 [facility] Office Visit from 09/30/2019 in [place] Office Visit from 09/25/2019 in [place] Office Visit from 08/19/2019 in [place] from 07/23/2019 in [place]  PHQ-2 Total Score 0 0 0 1 1  PHQ-9 Total Score 0 3 -- 9 6       Assessment and Plan:  Rebecca Moreno is a 54 y.o. year old female with a history of bipolar disorder by history, hypothyroidism, who presents for follow up appointment for below.    # MDD with psychotic features # Bipolar I disorder by history Although she reports occasionally depressed mood, it has been her baseline, and she  denies any changes in her mood since the last visit. Psychosocial stressors includes marital conflict with her husband in separation at home, and taking care of her mother in law with dementia.  Although it is preferable to avoid polypharmacy, the patient has strong preference to stay on the current regimen.  Will continue venlafaxine to target depression.  We will continue lamotrigine for mood dysregulation.  Discussed potential risk of Stevens-Johnson syndrome.  Will continue Vraylar and quetiapine to target mood dysregulation.  Discussed potential metabolic side effect.  Noted that although the patient was reportedly diagnosed with bipolar disorder in the past, both the patient and her husband denies any manic/hypomanic episode except irritability, paranoia in the context of depression.  We will continue to monitor.   Plan I have reviewed and updated plans as below 1. Continue Venlafaxine 300 mg daily  2. Continue Lamotrigine 300 mg daily  3. Continue Vraylar 6 mg  4. Continue Quetiapine 300 mg at night  5. Continue Clonazepam 1 mg twice a dayas needed for anxiety - one refill left to be filled 8/16  6.Next appointment: 7/22 at 1 PM for 20 mins, phone 7. Obtain record from your previous psychiatrist- pending  Past trials of medication:sertraline, fluoxetine, lexapro,Celexa, duloxetine,Wellbutrinlithium (psoriasis), Depakote(did not work), olanzapine, Abilify(weight gain), latuda, Geodon  The patient demonstrates the following risk  factors for suicide: Chronic risk factors for suicide include:psychiatric disorder ofdepression. Acute risk factorsfor suicide include: family or marital conflict and unemployment. Protective factorsfor this patient include: positive social support and hope for the future. Considering these factors, the overall suicide risk at this point appears to below. Patientisappropriate for outpatient follow up.  Norman Clay, MD 11/26/2019, 1:23 PM

## 2019-11-25 DIAGNOSIS — M5136 Other intervertebral disc degeneration, lumbar region: Secondary | ICD-10-CM | POA: Diagnosis not present

## 2019-11-26 ENCOUNTER — Encounter (HOSPITAL_COMMUNITY): Payer: Self-pay | Admitting: Psychiatry

## 2019-11-26 ENCOUNTER — Other Ambulatory Visit: Payer: Self-pay

## 2019-11-26 ENCOUNTER — Telehealth (INDEPENDENT_AMBULATORY_CARE_PROVIDER_SITE_OTHER): Payer: Medicare Other | Admitting: Psychiatry

## 2019-11-26 DIAGNOSIS — F39 Unspecified mood [affective] disorder: Secondary | ICD-10-CM

## 2019-11-26 MED ORDER — VRAYLAR 6 MG PO CAPS: 1.0000 | ORAL_CAPSULE | Freq: Every day | ORAL | 0 refills | Status: DC

## 2019-11-26 MED ORDER — VENLAFAXINE HCL ER 150 MG PO CP24: 300.0000 mg | ORAL_CAPSULE | Freq: Every day | ORAL | 0 refills | Status: DC

## 2019-11-26 MED ORDER — QUETIAPINE FUMARATE 300 MG PO TABS: 300.0000 mg | ORAL_TABLET | Freq: Every day | ORAL | 0 refills | Status: DC

## 2019-11-26 MED ORDER — CLONAZEPAM 1 MG PO TABS: 1.0000 mg | ORAL_TABLET | Freq: Two times a day (BID) | ORAL | 1 refills | Status: DC | PRN

## 2019-11-26 MED ORDER — LAMOTRIGINE 100 MG PO TABS: 300.0000 mg | ORAL_TABLET | Freq: Every day | ORAL | 0 refills | Status: DC

## 2019-12-04 DIAGNOSIS — J449 Chronic obstructive pulmonary disease, unspecified: Secondary | ICD-10-CM | POA: Diagnosis not present

## 2019-12-09 ENCOUNTER — Ambulatory Visit: Payer: Medicare Other | Admitting: Nutrition

## 2019-12-10 DIAGNOSIS — R0902 Hypoxemia: Secondary | ICD-10-CM | POA: Insufficient documentation

## 2019-12-11 DIAGNOSIS — J449 Chronic obstructive pulmonary disease, unspecified: Secondary | ICD-10-CM | POA: Diagnosis not present

## 2019-12-11 DIAGNOSIS — G4733 Obstructive sleep apnea (adult) (pediatric): Secondary | ICD-10-CM | POA: Diagnosis not present

## 2019-12-11 DIAGNOSIS — R0902 Hypoxemia: Secondary | ICD-10-CM | POA: Diagnosis not present

## 2019-12-14 DIAGNOSIS — R5383 Other fatigue: Secondary | ICD-10-CM | POA: Diagnosis not present

## 2019-12-14 DIAGNOSIS — R413 Other amnesia: Secondary | ICD-10-CM | POA: Diagnosis not present

## 2019-12-14 DIAGNOSIS — R0902 Hypoxemia: Secondary | ICD-10-CM | POA: Diagnosis not present

## 2019-12-14 DIAGNOSIS — I69328 Other speech and language deficits following cerebral infarction: Secondary | ICD-10-CM | POA: Diagnosis not present

## 2019-12-24 ENCOUNTER — Encounter: Payer: Self-pay | Admitting: Family Medicine

## 2019-12-24 ENCOUNTER — Ambulatory Visit (INDEPENDENT_AMBULATORY_CARE_PROVIDER_SITE_OTHER): Payer: Medicare Other | Admitting: Family Medicine

## 2019-12-24 ENCOUNTER — Other Ambulatory Visit: Payer: Self-pay

## 2019-12-24 VITALS — BP 119/73 | HR 84 | Temp 97.9°F | Ht 66.0 in | Wt 201.4 lb

## 2019-12-24 DIAGNOSIS — Z720 Tobacco use: Secondary | ICD-10-CM

## 2019-12-24 DIAGNOSIS — R5382 Chronic fatigue, unspecified: Secondary | ICD-10-CM | POA: Diagnosis not present

## 2019-12-24 DIAGNOSIS — Z114 Encounter for screening for human immunodeficiency virus [HIV]: Secondary | ICD-10-CM | POA: Diagnosis not present

## 2019-12-24 MED ORDER — VARENICLINE TARTRATE 1 MG PO TABS: 1.0000 mg | ORAL_TABLET | Freq: Two times a day (BID) | ORAL | 1 refills | Status: DC

## 2019-12-24 NOTE — Progress Notes (Signed)
Subjective: CC: Tobacco use disorder PCP: [name], DO HGD:JMEQA H Eckstrom is a 54 y.o. female presenting to clinic today for:  1.  Tobacco use disorder Patient continues to smoke about 7 cigarettes/day.  She is plateaued over the last few months with this.  Initially started at about half pack per day.  No hemoptysis, shortness of breath, night sweats, fevers, chills, difficulty swallowing or change in voice.  No sore throat.  2.  Fatigue Patient brings a list of labs that her neurologist asks to be checked.   ROS: Per HPI  Allergies  Allergen Reactions  . Lithium Other (See Comments)    Psoriasis    Past Medical History:  Diagnosis Date  . Acute pyelonephritis   . Asthma   . Bipolar disorder ([place])   . Colon polyp   . Complication of anesthesia    "sometimes hard to put to sleep"  . Depression    Bipolar  . Gastric ulcer    around 2013.  stress related  . GERD (gastroesophageal reflux disease)   . History of kidney stones   . Hyperlipidemia   . Hypothyroid   . Polyp, uterus corpus   . Sepsis due to Escherichia coli (E. coli) ([place]) 09/04/2018  . Sleep apnea    no cpap used  . Stroke [facility])     Current Outpatient Medications:  .  albuterol (PROVENTIL HFA;VENTOLIN HFA) 108 (90 Base) MCG/ACT inhaler, Inhale 1 puff into the lungs every 6 (six) hours as needed for wheezing or shortness of breath., Disp: , Rfl:  .  aspirin 325 MG tablet, Take 1 tablet by mouth daily., Disp: , Rfl:  .  atorvastatin (LIPITOR) 80 MG tablet, TAKE ONE TABLET BY MOUTH AT BEDTIME., Disp: 30 tablet, Rfl: 2 .  CALCIUM CITRATE-VITAMIN D3 PO, Take 1 tablet by mouth daily., Disp: , Rfl:  .  Cariprazine HCl (VRAYLAR) 6 MG CAPS, Take 1 capsule (6 mg total) by mouth daily., Disp: 90 capsule, Rfl: 0 .  Cholecalciferol (VITAMIN D3) 5000 units TABS, Take 5,000 Units by mouth daily. , Disp: , Rfl:  .  [START ON 01/19/2020] clonazePAM (KLONOPIN) 1 MG tablet, Take 1 tablet (1 mg total) by mouth 2  (two) times daily as needed for anxiety., Disp: 60 tablet, Rfl: 1 .  Dexlansoprazole (DEXILANT) 30 MG capsule, Take 30 mg by mouth daily., Disp: , Rfl:  .  famotidine (PEPCID) 20 MG tablet, Take 20 mg by mouth daily., Disp: , Rfl:  .  fluticasone (FLONASE) 50 MCG/ACT nasal spray, SPRAY 2 SPRAYS INTO EACH NOSTRIL EVERY DAY, Disp: 48 mL, Rfl: 0 .  fluticasone (FLOVENT HFA) 220 MCG/ACT inhaler, Inhale 1 puff into the lungs daily. , Disp: , Rfl:  .  lamoTRIgine (LAMICTAL) 100 MG tablet, Take 3 tablets (300 mg total) by mouth at bedtime., Disp: 270 tablet, Rfl: 0 .  levothyroxine (SYNTHROID) 50 MCG tablet, Take 1 tablet (50 mcg total) by mouth daily., Disp: 90 tablet, Rfl: 1 .  loratadine (CLARITIN) 10 MG tablet, TAKE 1 TABLET BY MOUTH EVERY DAY, Disp: 30 tablet, Rfl: 5 .  magnesium gluconate (MAGONATE) 500 MG tablet, Take 500 mg by mouth daily., Disp: , Rfl:  .  Multiple Vitamins-Minerals (MULTIVITAMIN WOMEN PO), Take 1 tablet by mouth daily., Disp: , Rfl:  .  QUEtiapine (SEROQUEL) 300 MG tablet, Take 1 tablet (300 mg total) by mouth at bedtime., Disp: 90 tablet, Rfl: 0 .  venlafaxine XR (EFFEXOR-XR) 150 MG 24 hr capsule, Take 2  capsules (300 mg total) by mouth daily., Disp: 180 capsule, Rfl: 0 .  varenicline (CHANTIX CONTINUING MONTH PAK) 1 MG tablet, Take 1 tablet (1 mg total) by mouth 2 (two) times daily., Disp: 180 tablet, Rfl: 1 Social History   Socioeconomic History  . Marital status: Married    Spouse name: Not on file  . Number of children: 1  . Years of education: Not on file  . Highest education level: High school graduate  Occupational History  . Occupation: disability  Tobacco Use  . Smoking status: Former Smoker    Packs/day: 0.50    Years: 30.00    Pack years: 15.00    Types: Cigarettes    Quit date: 09/06/2019    Years since quitting: 0.2  . Smokeless tobacco: Never Used  Vaping Use  . Vaping Use: Never used  Substance and Sexual Activity  . Alcohol use: Never  . Drug  use: Not Currently  . Sexual activity: Not Currently    Birth control/protection: Surgical    Comment: tubal  Other Topics Concern  . Not on file  Social History Narrative  . Not on file   Social Determinants of Health   Financial Resource Strain: Low Risk   . Difficulty of Paying Living Expenses: Not very hard  Food Insecurity: Unknown  . Worried About Charity fundraiser in the Last Year: Never true  . Ran Out of Food in the Last Year: Not on file  Transportation Needs: No Transportation Needs  . Lack of Transportation (Medical): No  . Lack of Transportation (Non-Medical): No  Physical Activity: Sufficiently Active  . Days of Exercise per Week: 5 days  . Minutes of Exercise per Session: 90 min  Stress:   . Feeling of Stress : Not on file  Social Connections: Socially Integrated  . Frequency of Communication with Friends and Family: Three times a week  . Frequency of Social Gatherings with Friends and Family: More than three times a week  . Attends Religious Services: 1 to 4 times per year  . Active Member of Clubs or Organizations: Not on file  . Attends Archivist Meetings: More than 4 times per year  . Marital Status: Married  Human resources officer Violence: Not At Risk  . Fear of Current or Ex-Partner: No  . Emotionally Abused: No  . Physically Abused: No  . Sexually Abused: No   Family History  Problem Relation Age of Onset  . Anxiety disorder Mother   . Hypertension Mother   . Heart failure Mother   . Stroke Mother   . Hyperlipidemia Father   . Diabetes Father   . Stroke Father   . Hypertension Father   . Colon polyps Father        older than 71  . Diabetes Sister   . Hypertension Sister   . Ovarian cancer Sister   . Asthma Daughter   . Ovarian cancer Maternal Grandmother   . Colon cancer Maternal Grandmother   . Renal Disease Paternal Grandmother   . Heart attack Paternal Grandfather   . Bipolar disorder Other     Objective: Office vital signs  reviewed. BP 119/73   Pulse 84   Temp 97.9 F (36.6 C)   Ht $R'5\' 6"'eF$  (1.676 m)   Wt 201 lb 6.4 oz (91.4 kg)   LMP 12/13/2017 (Approximate)   SpO2 97%   BMI 32.51 kg/m   Physical Examination:  General: Awake, alert, No acute distress HEENT: Normal, sclera white  Cardio: regular rate and rhythm, S1S2 heard, no murmurs appreciated Pulm: clear to auscultation bilaterally, no wheezes, rhonchi or rales; normal work of breathing on room air Psych: flat affect, pleasant  Assessment/ Plan: 54 y.o. female   1. Tobacco use Studies have shown effectiveness in preventing relapse for up to 1 year.  I extend her medication out through February of next year - varenicline (CHANTIX CONTINUING MONTH PAK) 1 MG tablet; Take 1 tablet (1 mg total) by mouth 2 (two) times daily.  Dispense: 180 tablet; Refill: 1  2. Chronic fatigue Her neurologist asked that I obtain the following labs and these have been ordered.  I have added HIV screening since she has not had this yet.  However, I suspect that much of her fatigue may be coming from her medications.  She is on Klonopin, Seroquel, both of which are sedating.  She has suspected sleep apnea. - CMP14+EGFR - TSH - Vitamin B12 - VITAMIN D 25 Hydroxy (Vit-D Deficiency, Fractures) - CBC - HIV antibody (with reflex)  3. Screening for HIV (human immunodeficiency virus) - HIV antibody (with reflex)   Orders Placed This Encounter  Procedures  . CMP14+EGFR  . TSH    Order Specific Question:   CC Results    Answer:   Barton Fanny [id number]  . Vitamin B12    Order Specific Question:   CC Results    AnswerBarton Fanny [[id number]]  . VITAMIN D 25 Hydroxy (Vit-D Deficiency, Fractures)    Order Specific Question:   CC Results    Answer:   Barton Fanny [[id number]]  . CBC    Order Specific Question:   CC Results    Answer:   Barton Fanny [[id number]]  . HIV antibody (with reflex)   Meds ordered this encounter  Medications  . varenicline (CHANTIX  CONTINUING MONTH PAK) 1 MG tablet    Sig: Take 1 tablet (1 mg total) by mouth 2 (two) times daily.    Dispense:  180 tablet    Refill:  [place], [place] [id number]

## 2019-12-25 LAB — CBC
Hematocrit: 45.6 % (ref 34.0–46.6)
Hemoglobin: 15.7 g/dL (ref 11.1–15.9)
MCH: 34.7 pg — ABNORMAL HIGH (ref 26.6–33.0)
MCHC: 34.4 g/dL (ref 31.5–35.7)
MCV: 101 fL — ABNORMAL HIGH (ref 79–97)
Platelets: 210 10*3/uL (ref 150–450)
RBC: 4.52 x10E6/uL (ref 3.77–5.28)
RDW: 11.7 % (ref 11.7–15.4)
WBC: 9 10*3/uL (ref 3.4–10.8)

## 2019-12-25 LAB — CMP14+EGFR
ALT: 23 IU/L (ref 0–32)
AST: 15 IU/L (ref 0–40)
Albumin/Globulin Ratio: 2.7 — ABNORMAL HIGH (ref 1.2–2.2)
Albumin: 4.3 g/dL (ref 3.8–4.9)
Alkaline Phosphatase: 132 IU/L — ABNORMAL HIGH (ref 48–121)
BUN/Creatinine Ratio: 16 (ref 9–23)
BUN: 13 mg/dL (ref 6–24)
Bilirubin Total: 0.2 mg/dL (ref 0.0–1.2)
CO2: 30 mmol/L — ABNORMAL HIGH (ref 20–29)
Calcium: 9.2 mg/dL (ref 8.7–10.2)
Chloride: 102 mmol/L (ref 96–106)
Creatinine, Ser: 0.82 mg/dL (ref 0.57–1.00)
GFR calc Af Amer: 93 mL/min/{1.73_m2} (ref >59)
GFR calc non Af Amer: 81 mL/min/{1.73_m2} (ref >59)
Globulin, Total: 1.6 g/dL (ref 1.5–4.5)
Glucose: 102 mg/dL — ABNORMAL HIGH (ref 65–99)
Potassium: 4.4 mmol/L (ref 3.5–5.2)
Sodium: 142 mmol/L (ref 134–144)
Total Protein: 5.9 g/dL — ABNORMAL LOW (ref 6.0–8.5)

## 2019-12-25 LAB — TSH: TSH: 0.718 u[IU]/mL (ref 0.450–4.500)

## 2019-12-25 LAB — VITAMIN B12: Vitamin B-12: 1799 pg/mL — ABNORMAL HIGH (ref 232–1245)

## 2019-12-25 LAB — VITAMIN D 25 HYDROXY (VIT D DEFICIENCY, FRACTURES): Vit D, 25-Hydroxy: 48.1 ng/mL (ref 30.0–100.0)

## 2019-12-25 LAB — HIV ANTIBODY (ROUTINE TESTING W REFLEX): HIV Screen 4th Generation wRfx: NONREACTIVE

## 2020-01-11 DIAGNOSIS — G4733 Obstructive sleep apnea (adult) (pediatric): Secondary | ICD-10-CM | POA: Diagnosis not present

## 2020-01-11 DIAGNOSIS — J449 Chronic obstructive pulmonary disease, unspecified: Secondary | ICD-10-CM | POA: Diagnosis not present

## 2020-01-11 DIAGNOSIS — R0902 Hypoxemia: Secondary | ICD-10-CM | POA: Diagnosis not present

## 2020-01-14 ENCOUNTER — Other Ambulatory Visit: Payer: Self-pay

## 2020-01-14 ENCOUNTER — Inpatient Hospital Stay (HOSPITAL_COMMUNITY): Payer: Medicare Other | Attending: Hematology | Admitting: Hematology

## 2020-01-14 ENCOUNTER — Inpatient Hospital Stay (HOSPITAL_COMMUNITY): Payer: Medicare Other

## 2020-01-14 ENCOUNTER — Encounter (HOSPITAL_COMMUNITY): Payer: Self-pay | Admitting: Hematology

## 2020-01-14 VITALS — BP 110/81 | HR 88 | Resp 16 | Wt 201.8 lb

## 2020-01-14 DIAGNOSIS — Z8349 Family history of other endocrine, nutritional and metabolic diseases: Secondary | ICD-10-CM | POA: Diagnosis not present

## 2020-01-14 DIAGNOSIS — Z800 Family history of malignant neoplasm of digestive organs: Secondary | ICD-10-CM | POA: Insufficient documentation

## 2020-01-14 DIAGNOSIS — Z8673 Personal history of transient ischemic attack (TIA), and cerebral infarction without residual deficits: Secondary | ICD-10-CM | POA: Insufficient documentation

## 2020-01-14 DIAGNOSIS — Z833 Family history of diabetes mellitus: Secondary | ICD-10-CM | POA: Insufficient documentation

## 2020-01-14 DIAGNOSIS — Z7982 Long term (current) use of aspirin: Secondary | ICD-10-CM | POA: Insufficient documentation

## 2020-01-14 DIAGNOSIS — Z8041 Family history of malignant neoplasm of ovary: Secondary | ICD-10-CM | POA: Insufficient documentation

## 2020-01-14 DIAGNOSIS — D751 Secondary polycythemia: Secondary | ICD-10-CM | POA: Insufficient documentation

## 2020-01-14 DIAGNOSIS — F319 Bipolar disorder, unspecified: Secondary | ICD-10-CM | POA: Insufficient documentation

## 2020-01-14 DIAGNOSIS — E785 Hyperlipidemia, unspecified: Secondary | ICD-10-CM | POA: Diagnosis not present

## 2020-01-14 DIAGNOSIS — K219 Gastro-esophageal reflux disease without esophagitis: Secondary | ICD-10-CM | POA: Diagnosis not present

## 2020-01-14 DIAGNOSIS — G4730 Sleep apnea, unspecified: Secondary | ICD-10-CM | POA: Diagnosis not present

## 2020-01-14 DIAGNOSIS — J45909 Unspecified asthma, uncomplicated: Secondary | ICD-10-CM | POA: Diagnosis not present

## 2020-01-14 DIAGNOSIS — Z7951 Long term (current) use of inhaled steroids: Secondary | ICD-10-CM | POA: Diagnosis not present

## 2020-01-14 DIAGNOSIS — E039 Hypothyroidism, unspecified: Secondary | ICD-10-CM | POA: Diagnosis not present

## 2020-01-14 DIAGNOSIS — Z79899 Other long term (current) drug therapy: Secondary | ICD-10-CM | POA: Diagnosis not present

## 2020-01-14 DIAGNOSIS — Z8249 Family history of ischemic heart disease and other diseases of the circulatory system: Secondary | ICD-10-CM | POA: Diagnosis not present

## 2020-01-14 DIAGNOSIS — Z87891 Personal history of nicotine dependence: Secondary | ICD-10-CM | POA: Insufficient documentation

## 2020-01-14 LAB — CBC WITH DIFFERENTIAL/PLATELET
Abs Immature Granulocytes: 0.07 10*3/uL (ref 0.00–0.07)
Basophils Absolute: 0.1 10*3/uL (ref 0.0–0.1)
Basophils Relative: 1 %
Eosinophils Absolute: 0 10*3/uL (ref 0.0–0.5)
Eosinophils Relative: 0 %
HCT: 47.7 % — ABNORMAL HIGH (ref 36.0–46.0)
Hemoglobin: 15.7 g/dL — ABNORMAL HIGH (ref 12.0–15.0)
Immature Granulocytes: 1 %
Lymphocytes Relative: 14 %
Lymphs Abs: 1.7 10*3/uL (ref 0.7–4.0)
MCH: 34.4 pg — ABNORMAL HIGH (ref 26.0–34.0)
MCHC: 32.9 g/dL (ref 30.0–36.0)
MCV: 104.6 fL — ABNORMAL HIGH (ref 80.0–100.0)
Monocytes Absolute: 0.9 10*3/uL (ref 0.1–1.0)
Monocytes Relative: 8 %
Neutro Abs: 9.5 10*3/uL — ABNORMAL HIGH (ref 1.7–7.7)
Neutrophils Relative %: 76 %
Platelets: 238 10*3/uL (ref 150–400)
RBC: 4.56 MIL/uL (ref 3.87–5.11)
RDW: 12.7 % (ref 11.5–15.5)
WBC: 12.2 10*3/uL — ABNORMAL HIGH (ref 4.0–10.5)
nRBC: 0 % (ref 0.0–0.2)

## 2020-01-14 NOTE — Patient Instructions (Signed)
[facility] at [facility] Discharge Instructions  You were seen today by Dr. Katragadda. He went over your recent results. Dr. Katragadda will see you back in 6 months for labs and follow up.   Thank you for choosing [facility] at [facility] to provide your oncology and hematology care.  To afford each patient quality time with our provider, please arrive at least 15 minutes before your scheduled appointment time.   If you have a lab appointment with the Cancer Center please come in thru the Main Entrance and check in at the main information desk  You need to re-schedule your appointment should you arrive 10 or more minutes late.  We strive to give you quality time with our providers, and arriving late affects you and other patients whose appointments are after yours.  Also, if you no show three or more times for appointments you may be dismissed from the clinic at the providers discretion.     Again, thank you for choosing [facility].  Our hope is that these requests will decrease the amount of time that you wait before being seen by our physicians.       _____________________________________________________________  Should you have questions after your visit to [facility], please contact our office at [phone] between the hours of 8:00 a.m. and 4:30 p.m.  Voicemails left after 4:00 p.m. will not be returned until the following business day.  For prescription refill requests, have your pharmacy contact our office and allow 72 hours.    Cancer Center Support Programs:   > Cancer Support Group  2nd Tuesday of the month 1pm-2pm, Journey Room    

## 2020-01-14 NOTE — Progress Notes (Signed)
[facility], [place] [address]   CLINIC:  Medical Oncology/Hematology  PCP:  [name], DO [address] [place] [address]  [phone]  REASON FOR VISIT:  Follow-up for erythrocytosis  PRIOR THERAPY: None  CURRENT THERAPY: Phlebotomy  INTERVAL HISTORY:  Rebecca Moreno, a 54 y.o. female, returns for routine follow-up for her erythrocytosis. Rebecca Moreno was last seen on 10/14/2019.  Today she reports feeling well. She reports having 1 headache a month ago, but no more. She has almost finished her Chantix and she has not smoked since 5/17. She reports that her strength has improved significantly. She finished speech therapy at the beginning of August and her aphasia has improved to her baseline. She continues using her Flovent and Flonase. She denies having F/C, night sweats, or recent infections. She is currently on 2 L of oxygen via [place] at night.   REVIEW OF SYSTEMS:  Review of Systems  Constitutional: Positive for appetite change and fatigue. Negative for chills, diaphoresis and fever.  Neurological: Positive for headaches (rarely).    PAST MEDICAL/SURGICAL HISTORY:  Past Medical History:  Diagnosis Date  . Acute pyelonephritis   . Asthma   . Bipolar disorder ([place])   . Colon polyp   . Complication of anesthesia    "sometimes hard to put to sleep"  . Depression    Bipolar  . Gastric ulcer    around 2013.  stress related  . GERD (gastroesophageal reflux disease)   . History of kidney stones   . Hyperlipidemia   . Hypothyroid   . Polyp, uterus corpus   . Sepsis due to Escherichia coli (E. coli) ([place]) 09/04/2018  . Sleep apnea    no cpap used  . Stroke [facility])    Past Surgical History:  Procedure Laterality Date  . ABLATION ON ENDOMETRIOSIS    . COLONOSCOPY     Per patient, done around 2013 in [place], had polyp and overdue for follow up.  . COLONOSCOPY WITH PROPOFOL N/A 05/12/2018   Procedure: COLONOSCOPY WITH  PROPOFOL;  Surgeon: Daneil Dolin, MD;  Location: [facility];  Service: Endoscopy;  Laterality: N/A;  12:00pm  . CYSTOSCOPY W/ URETERAL STENT PLACEMENT Left 09/01/2018   Procedure: CYSTOSCOPY WITH RETROGRADE PYELOGRAM/URETERAL STENT PLACEMENT;  Surgeon: Ceasar Mons, MD;  Location: [facility];  Service: Urology;  Laterality: Left;  . CYSTOSCOPY/URETEROSCOPY/HOLMIUM LASER/STENT PLACEMENT Left 09/17/2018   Procedure: CYSTOSCOPY/URETEROSCOPY/HOLMIUM LASER/STENT PLACEMENT;  Surgeon: Ceasar Mons, MD;  Location: [facility];  Service: Urology;  Laterality: Left;  ONLY NEEDS 45 MIN  . OVARIAN CYST SURGERY Left   . POLYPECTOMY  05/12/2018   Procedure: POLYPECTOMY;  Surgeon: Daneil Dolin, MD;  Location: [facility];  Service: Endoscopy;;  polyp at splenic flexure  . TEMPOROMANDIBULAR JOINT SURGERY     9 surgeries  . TUBAL LIGATION    . vaginal childbirth     1    SOCIAL HISTORY:  Social History   Socioeconomic History  . Marital status: Married    Spouse name: Not on file  . Number of children: 1  . Years of education: Not on file  . Highest education level: High school graduate  Occupational History  . Occupation: disability  Tobacco Use  . Smoking status: Former Smoker    Packs/day: 0.50    Years: 30.00    Pack years: 15.00    Types: Cigarettes    Quit date: 09/06/2019  Years since quitting: 0.3  . Smokeless tobacco: Never Used  Vaping Use  . Vaping Use: Never used  Substance and Sexual Activity  . Alcohol use: Never  . Drug use: Not Currently  . Sexual activity: Not Currently    Birth control/protection: Surgical    Comment: tubal  Other Topics Concern  . Not on file  Social History Narrative  . Not on file   Social Determinants of Health   Financial Resource Strain: Low Risk   . Difficulty of Paying Living Expenses: Not very hard  Food Insecurity: Unknown  . Worried About Charity fundraiser in the Last Year: Never true  . Ran Out of Food in  the Last Year: Not on file  Transportation Needs: No Transportation Needs  . Lack of Transportation (Medical): No  . Lack of Transportation (Non-Medical): No  Physical Activity: Sufficiently Active  . Days of Exercise per Week: 5 days  . Minutes of Exercise per Session: 90 min  Stress:   . Feeling of Stress : Not on file  Social Connections: Socially Integrated  . Frequency of Communication with Friends and Family: Three times a week  . Frequency of Social Gatherings with Friends and Family: More than three times a week  . Attends Religious Services: 1 to 4 times per year  . Active Member of Clubs or Organizations: Not on file  . Attends Archivist Meetings: More than 4 times per year  . Marital Status: Married  Human resources officer Violence: Not At Risk  . Fear of Current or Ex-Partner: No  . Emotionally Abused: No  . Physically Abused: No  . Sexually Abused: No    FAMILY HISTORY:  Family History  Problem Relation Age of Onset  . Anxiety disorder Mother   . Hypertension Mother   . Heart failure Mother   . Stroke Mother   . Hyperlipidemia Father   . Diabetes Father   . Stroke Father   . Hypertension Father   . Colon polyps Father        older than 31  . Diabetes Sister   . Hypertension Sister   . Ovarian cancer Sister   . Asthma Daughter   . Ovarian cancer Maternal Grandmother   . Colon cancer Maternal Grandmother   . Renal Disease Paternal Grandmother   . Heart attack Paternal Grandfather   . Bipolar disorder Other     CURRENT MEDICATIONS:  Current Outpatient Medications  Medication Sig Dispense Refill  . Acetaminophen-Codeine 300-30 MG tablet acetaminophen 300 mg-codeine 30 mg tablet  TAKE 1 TABLET BY MOUTH EVERY 4 HOURS AS NEEDED FOR PAIN    . albuterol (PROVENTIL HFA;VENTOLIN HFA) 108 (90 Base) MCG/ACT inhaler Inhale 1 puff into the lungs every 6 (six) hours as needed for wheezing or shortness of breath.    Marland Kitchen aspirin 325 MG tablet Take 1 tablet by mouth  daily.    Marland Kitchen atorvastatin (LIPITOR) 80 MG tablet TAKE ONE TABLET BY MOUTH AT BEDTIME. 30 tablet 2  . CALCIUM CITRATE-VITAMIN D3 PO Take 1 tablet by mouth daily.    . Cariprazine HCl (VRAYLAR) 6 MG CAPS Take 1 capsule (6 mg total) by mouth daily. 90 capsule 0  . Cholecalciferol (VITAMIN D3) 5000 units TABS Take 5,000 Units by mouth daily.     Derrill Memo ON 01/19/2020] clonazePAM (KLONOPIN) 1 MG tablet Take 1 tablet (1 mg total) by mouth 2 (two) times daily as needed for anxiety. 60 tablet 1  . Dexlansoprazole (DEXILANT) 30  MG capsule Take 30 mg by mouth daily.    . famotidine (PEPCID) 20 MG tablet Take 20 mg by mouth daily.    . fluticasone (FLONASE) 50 MCG/ACT nasal spray SPRAY 2 SPRAYS INTO EACH NOSTRIL EVERY DAY 48 mL 0  . fluticasone (FLOVENT HFA) 220 MCG/ACT inhaler Inhale 1 puff into the lungs daily.     Marland Kitchen lamoTRIgine (LAMICTAL) 100 MG tablet Take 3 tablets (300 mg total) by mouth at bedtime. 270 tablet 0  . levothyroxine (SYNTHROID) 50 MCG tablet Take 1 tablet (50 mcg total) by mouth daily. 90 tablet 1  . loratadine (CLARITIN) 10 MG tablet TAKE 1 TABLET BY MOUTH EVERY DAY 30 tablet 5  . magnesium gluconate (MAGONATE) 500 MG tablet Take 500 mg by mouth daily.    . Multiple Vitamins-Minerals (MULTIVITAMIN WOMEN PO) Take 1 tablet by mouth daily.    . QUEtiapine (SEROQUEL) 300 MG tablet Take 1 tablet (300 mg total) by mouth at bedtime. 90 tablet 0  . varenicline (CHANTIX CONTINUING MONTH PAK) 1 MG tablet Take 1 tablet (1 mg total) by mouth 2 (two) times daily. 180 tablet 1  . venlafaxine XR (EFFEXOR-XR) 150 MG 24 hr capsule Take 2 capsules (300 mg total) by mouth daily. 180 capsule 0   No current facility-administered medications for this visit.    ALLERGIES:  Allergies  Allergen Reactions  . Lithium Other (See Comments)    Psoriasis     PHYSICAL EXAM:  Performance status (ECOG): 1 - Symptomatic but completely ambulatory  Vitals:   01/14/20 1629  BP: 110/81  Pulse: 88  Resp: 16    SpO2: 96%   Wt Readings from Last 3 Encounters:  01/14/20 201 lb 12.8 oz (91.5 kg)  12/24/19 201 lb 6.4 oz (91.4 kg)  10/14/19 201 lb 12.8 oz (91.5 kg)   Physical Exam Vitals reviewed.  Constitutional:      Appearance: Normal appearance.  Cardiovascular:     Rate and Rhythm: Normal rate and regular rhythm.     Pulses: Normal pulses.     Heart sounds: Normal heart sounds.  Pulmonary:     Effort: Pulmonary effort is normal.     Breath sounds: Normal breath sounds.  Abdominal:     Palpations: Abdomen is soft. There is no hepatomegaly, splenomegaly or mass.     Tenderness: There is no abdominal tenderness.     Hernia: No hernia is present.  Neurological:     General: No focal deficit present.     Mental Status: She is alert and oriented to person, place, and time.  Psychiatric:        Mood and Affect: Mood normal.        Behavior: Behavior normal.     LABORATORY DATA:  I have reviewed the labs as listed.  CBC Latest Ref Rng & Units 01/14/2020 12/24/2019 10/14/2019  WBC 4.0 - 10.5 K/uL 12.2(H) 9.0 8.0  Hemoglobin 12.0 - 15.0 g/dL 15.7(H) 15.7 15.8(H)  Hematocrit 36 - 46 % 47.7(H) 45.6 48.1(H)  Platelets 150 - 400 K/uL 238 210 210   CMP Latest Ref Rng & Units 12/24/2019 08/19/2019 01/05/2019  Glucose 65 - 99 mg/dL 102(H) 77 82  BUN 6 - 24 mg/dL 13 23 14   Creatinine 0.57 - 1.00 mg/dL 0.82 0.84 1.17(H)  Sodium 134 - 144 mmol/L 142 142 139  Potassium 3.5 - 5.2 mmol/L 4.4 4.6 4.5  Chloride 96 - 106 mmol/L 102 100 101  CO2 20 - 29 mmol/L 30(H) 23 30  Calcium 8.7 - 10.2 mg/dL 9.2 9.4 9.8  Total Protein 6.0 - 8.5 g/dL 5.9(L) - 7.3  Total Bilirubin 0.0 - 1.2 mg/dL 0.2 - 0.4  Alkaline Phos 48 - 121 IU/L 132(H) - 122  AST 0 - 40 IU/L 15 - 21  ALT 0 - 32 IU/L 23 - 24      Component Value Date/Time   RBC 4.56 01/14/2020 1449   MCV 104.6 (H) 01/14/2020 1449   MCV 101 (H) 12/24/2019 1454   MCH 34.4 (H) 01/14/2020 1449   MCHC 32.9 01/14/2020 1449   RDW 12.7 01/14/2020 1449   RDW  11.7 12/24/2019 1454   LYMPHSABS 1.7 01/14/2020 1449   LYMPHSABS 1.9 01/15/2018 1500   MONOABS 0.9 01/14/2020 1449   EOSABS 0.0 01/14/2020 1449   EOSABS 0.2 01/15/2018 1500   BASOSABS 0.1 01/14/2020 1449   BASOSABS 0.1 01/15/2018 1500   Lab Results  Component Value Date   VD25OH 48.1 12/24/2019    DIAGNOSTIC IMAGING:  I have independently reviewed the scans and discussed with the patient. No results found.   ASSESSMENT:  1. 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 JAK2 V617F and reflex mutation testing was negative. -Erythropoietin was 15.9. White count and platelet count was normal. -History of chronic headaches. Smoked half pack per day for 40 years. She quit smoking when she had stroke in May of this year. -CT renal study on 09/01/2018 showed mild left hydroureteronephrosis due to 4 mm mid left ureteral calculus. Tiny left renal calculus. Severe hepatic steatosis. -Phlebotomy on 02/05/2019. -She also has sleep apnea and has not been using CPAP machine consistently.  2. Carboxyhemoglobinemia: -Carboxyhemoglobin was elevated at 18.4. ABG showed PO2 of 51 and PCO2 of 52. Elevated carboxyhemoglobin was out of proportion to her smoking.  3. 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 Right hemiparesis improved as did her speech. She continues to have some word finding difficulties. -MR angiogram of the head and neck region was done on 10/12/2019. She is on aspirin 325 mg daily.   PLAN:  1. 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 -White count mildly elevated at 12.2.  She is on steroid inhaler and nasal drops.  Platelet count is normal. -No phlebotomy needed.  Recommend follow-up in 6  months with labs.  2. Right hemiparesis and aphasia: -This has completely improved at this time.  3. Abnormal mammogram: -Mammogram on 10/08/2019 was BI-RADS Category 0 with asymmetry in the lower inner right breast posteriorly. -Right breast diagnostic mammogram and ultrasound showed BI-RADS Category 3 with right diagnostic mammogram in 6 months recommended.  Orders placed this encounter:  No orders of the defined types were placed in this encounter.    Derek Jack, MD [place] [phone]   I, Milinda Antis, am acting as a scribe for Dr. Sanda Linger.  I, Derek Jack MD, have reviewed the above documentation for accuracy and completeness, and I agree with the above.

## 2020-01-15 NOTE — Addendum Note (Signed)
Addended by: Donetta Potts on: 01/15/2020 08:26 AM   Modules accepted: Orders

## 2020-01-29 ENCOUNTER — Other Ambulatory Visit: Payer: Self-pay | Admitting: Family Medicine

## 2020-02-02 ENCOUNTER — Other Ambulatory Visit: Payer: Self-pay | Admitting: *Deleted

## 2020-02-02 MED ORDER — LEVOTHYROXINE SODIUM 50 MCG PO TABS: 50.0000 ug | ORAL_TABLET | Freq: Every day | ORAL | 11 refills | Status: DC

## 2020-02-02 NOTE — Telephone Encounter (Signed)
Please make sure patient is requesting these refills.  She was trialing them.

## 2020-02-04 MED ORDER — DEXILANT 30 MG PO CPDR: 30.0000 mg | DELAYED_RELEASE_CAPSULE | Freq: Every day | ORAL | 0 refills | Status: DC

## 2020-02-04 MED ORDER — FAMOTIDINE 20 MG PO TABS: 20.0000 mg | ORAL_TABLET | Freq: Every day | ORAL | 0 refills | Status: DC

## 2020-02-10 DIAGNOSIS — G4733 Obstructive sleep apnea (adult) (pediatric): Secondary | ICD-10-CM | POA: Diagnosis not present

## 2020-02-10 DIAGNOSIS — R0902 Hypoxemia: Secondary | ICD-10-CM | POA: Diagnosis not present

## 2020-02-10 DIAGNOSIS — J449 Chronic obstructive pulmonary disease, unspecified: Secondary | ICD-10-CM | POA: Diagnosis not present

## 2020-02-15 NOTE — Progress Notes (Addendum)
Virtual Visit via Telephone Note  I connected with Rebecca Moreno on 02/18/20 at  1:00 PM EDT by telephone and verified that I am speaking with the correct person using two identifiers.   I discussed the limitations, risks, security and privacy concerns of performing an evaluation and management service by telephone and the availability of in person appointments. I also discussed with the patient that there may be a patient responsible charge related to this service. The patient expressed understanding and agreed to proceed.    I discussed the assessment and treatment plan with the patient. The patient was provided an opportunity to ask questions and all were answered. The patient agreed with the plan and demonstrated an understanding of the instructions.   The patient was advised to call back or seek an in-person evaluation if the symptoms worsen or if the condition fails to improve as anticipated.  Location: patient- home, provider- home office   I provided 12 minutes of non-face-to-face time during this encounter.   Norman Clay, MD    [facility] MD/PA/NP OP Progress Note  02/18/2020 1:28 PM Rebecca Moreno  MRN:  [id number]  Chief Complaint:  Chief Complaint    Follow-up; Depression     HPI:  This is a follow-up appointment for depression.  She states that she had stroke in May.  She did have some weakness, but denies any interference with her activities.  She reports fair relationship with her husband, who is currently in a car with the patient.  Her daughter got married in June.  She visits her daughter every day.  She enjoys seeing her friends.  She has fair sleep.  She feels depressed at times.  She has fair concentration.  She has slightly decreased appetite, and states she lost a few pounds.  She denies SI.  She feels anxious and tense at times.  She denies panic attacks.  She denies decreased need for sleep or euphoria.    Daily routine: visits her daughter, who lives 15 miles  away Employment: on disability after TMJ surgery in 2007. 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 Her parents had marital discordance and the patient screamed when she was a child as she did not want to hear them. She reports good relationship with both of her parents otherwise. She has one sister, who she has good relationship with  Visit Diagnosis:    ICD-10-CM   1. MDD (major depressive disorder), recurrent episode, mild ([place])  F33.0   2. Mood disorder (HCC)  F39 lamoTRIgine (LAMICTAL) 100 MG tablet    QUEtiapine (SEROQUEL) 300 MG tablet    venlafaxine XR (EFFEXOR-XR) 150 MG 24 hr capsule    Past Psychiatric History: Please see initial evaluation for full details. I have reviewed the history. No updates at this time.     Past Medical History:  Past Medical History:  Diagnosis Date  . Acute pyelonephritis   . Asthma   . Bipolar disorder ([place])   . Colon polyp   . Complication of anesthesia    "sometimes hard to put to sleep"  . Depression    Bipolar  . Gastric ulcer    around 2013.  stress related  . GERD (gastroesophageal reflux disease)   . History of kidney stones   . Hyperlipidemia   . Hypothyroid   . Polyp, uterus corpus   . Sepsis  due to Escherichia coli (E. coli) ([place]) 09/04/2018  . Sleep apnea    no cpap used  . Stroke [facility])     Past Surgical History:  Procedure Laterality Date  . ABLATION ON ENDOMETRIOSIS    . COLONOSCOPY     Per patient, done around 2013 in [place], had polyp and overdue for follow up.  . COLONOSCOPY WITH PROPOFOL N/A 05/12/2018   Procedure: COLONOSCOPY WITH PROPOFOL;  Surgeon: Daneil Dolin, MD;  Location[facility] SU[facility];  Service: Endoscopy;  Laterality: N/A;  12:00pm  . CYSTOSCOPY W/ URETERAL STENT PLACEMENT Left 09/01/2018   Procedure: CYSTOSCOPY WITH RETROGRADE PYELOGRAM/URETERAL STENT PLACEMENT;  Surgeon: Ceasar Mons, MD;  Location: [facility] ORS;  Service: Urology;  Laterality: Left;  . CYSTOSCOPY/URETEROSCOPY/HOLMIUM LASER/STENT PLACEMENT Left 09/17/2018   Procedure: CYSTOSCOPY/URETEROSCOPY/HOLMIUM LASER/STENT PLACEMENT;  Surgeon: Ceasar Mons, MD;  Location: [facility] ORS;  Service: Urology;  Laterality: Left;  ONLY NEEDS 45 MIN  . OVARIAN CYST SURGERY Left   . POLYPECTOMY  05/12/2018   Procedure: POLYPECTOMY;  Surgeon: Daneil Dolin, MD;  Location: [facility];  Service: Endoscopy;;  polyp at splenic flexure  . TEMPOROMANDIBULAR JOINT SURGERY     9 surgeries  . TUBAL LIGATION    . vaginal childbirth     1    Family Psychiatric History: Please see initial evaluation for full details. I have reviewed the history. No updates at this time.     Family History:  Family History  Problem Relation Age of Onset  . Anxiety disorder Mother   . Hypertension Mother   . Heart failure Mother   . Stroke Mother   . Hyperlipidemia Father   . Diabetes Father   . Stroke Father   . Hypertension Father   . Colon polyps Father        older than 48  . Diabetes Sister   . Hypertension Sister   . Ovarian cancer Sister   . Asthma Daughter   . Ovarian cancer Maternal Grandmother   . Colon cancer Maternal Grandmother   . Renal Disease Paternal Grandmother   . Heart attack Paternal Grandfather   . Bipolar disorder Other     Social History:  Social History   Socioeconomic History  . Marital status: Married    Spouse name: Not on file  . Number of children: 1  . Years of education: Not on file  . Highest education level: High school graduate  Occupational History  . Occupation: disability  Tobacco Use  . Smoking status: Former Smoker    Packs/day: 0.50    Years: 30.00    Pack years: 15.00    Types: Cigarettes    Quit date: 09/06/2019    Years since quitting: 0.4  . Smokeless tobacco: Never Used  Vaping Use  . Vaping Use: Never used  Substance and Sexual Activity  . Alcohol use:  Never  . Drug use: Not Currently  . Sexual activity: Not Currently    Birth control/protection: Surgical    Comment: tubal  Other Topics Concern  . Not on file  Social History Narrative  . Not on file   Social Determinants of Health   Financial Resource Strain: Low Risk   . Difficulty of Paying Living Expenses: Not very hard  Food Insecurity: Unknown  . Worried About Charity fundraiser in the Last Year: Never true  . Ran Out of Food in the Last Year: Not on file  Transportation Needs: No Transportation Needs  .  Lack of Transportation (Medical): No  . Lack of Transportation (Non-Medical): No  Physical Activity: Sufficiently Active  . Days of Exercise per Week: 5 days  . Minutes of Exercise per Session: 90 min  Stress:   . Feeling of Stress : Not on file  Social Connections: Socially Integrated  . Frequency of Communication with Friends and Family: Three times a week  . Frequency of Social Gatherings with Friends and Family: More than three times a week  . Attends Religious Services: 1 to 4 times per year  . Active Member of Clubs or Organizations: Not on file  . Attends Archivist Meetings: More than 4 times per year  . Marital Status: Married    Allergies:  Allergies  Allergen Reactions  . Lithium Other (See Comments)    Psoriasis     Metabolic Disorder Labs: No results found for: HGBA1C, MPG No results found for: PROLACTIN Lab Results  Component Value Date   CHOL 212 (H) 12/18/2018   TRIG 171 (H) 12/18/2018   HDL 44 12/18/2018   CHOLHDL 4.8 (H) 12/18/2018   LDLCALC 134 (H) 12/18/2018   LDLCALC 168 (H) 01/15/2018   Lab Results  Component Value Date   TSH 0.718 12/24/2019   TSH 2.580 08/19/2019    Therapeutic Level Labs: No results found for: LITHIUM No results found for: VALPROATE No components found for:  CBMZ  Current Medications: Current Outpatient Medications  Medication Sig Dispense Refill  . Acetaminophen-Codeine 300-30 MG tablet  acetaminophen 300 mg-codeine 30 mg tablet  TAKE 1 TABLET BY MOUTH EVERY 4 HOURS AS NEEDED FOR PAIN    . albuterol (PROVENTIL HFA;VENTOLIN HFA) 108 (90 Base) MCG/ACT inhaler Inhale 1 puff into the lungs every 6 (six) hours as needed for wheezing or shortness of breath.    Marland Kitchen aspirin 325 MG tablet Take 1 tablet by mouth daily.    Marland Kitchen atorvastatin (LIPITOR) 80 MG tablet TAKE ONE TABLET BY MOUTH AT BEDTIME. 90 tablet PRN  . CALCIUM CITRATE-VITAMIN D3 PO Take 1 tablet by mouth daily.    . Cariprazine HCl (VRAYLAR) 6 MG CAPS Take 1 capsule (6 mg total) by mouth daily. 90 capsule 0  . Cholecalciferol (VITAMIN D3) 5000 units TABS Take 5,000 Units by mouth daily.     . clonazePAM (KLONOPIN) 1 MG tablet Take 1 tablet (1 mg total) by mouth 2 (two) times daily as needed for anxiety. 60 tablet 1  . Dexlansoprazole (DEXILANT) 30 MG capsule Take 1 capsule (30 mg total) by mouth daily. 30 capsule 0  . famotidine (PEPCID) 20 MG tablet Take 1 tablet (20 mg total) by mouth daily. 30 tablet 0  . fluticasone (FLONASE) 50 MCG/ACT nasal spray SPRAY 2 SPRAYS INTO EACH NOSTRIL EVERY DAY 48 mL 0  . fluticasone (FLOVENT HFA) 220 MCG/ACT inhaler Inhale 1 puff into the lungs daily.     Marland Kitchen lamoTRIgine (LAMICTAL) 100 MG tablet Take 3 tablets (300 mg total) by mouth at bedtime. 270 tablet 0  . levothyroxine (SYNTHROID) 50 MCG tablet Take 1 tablet (50 mcg total) by mouth daily. 30 tablet 11  . loratadine (CLARITIN) 10 MG tablet TAKE 1 TABLET BY MOUTH EVERY DAY 30 tablet 5  . magnesium gluconate (MAGONATE) 500 MG tablet Take 500 mg by mouth daily.    . Multiple Vitamins-Minerals (MULTIVITAMIN WOMEN PO) Take 1 tablet by mouth daily.    . QUEtiapine (SEROQUEL) 300 MG tablet Take 1 tablet (300 mg total) by mouth at bedtime. 90 tablet 0  .  varenicline (CHANTIX CONTINUING MONTH PAK) 1 MG tablet Take 1 tablet (1 mg total) by mouth 2 (two) times daily. 180 tablet 1  . venlafaxine XR (EFFEXOR-XR) 150 MG 24 hr capsule Take 2 capsules (300 mg  total) by mouth daily. 180 capsule 0   No current facility-administered medications for this visit.     Musculoskeletal: Strength & Muscle Tone: N/A Gait & Station: N/A Patient leans: N/A  Psychiatric Specialty Exam: Review of Systems  Psychiatric/Behavioral: Positive for dysphoric mood and sleep disturbance. Negative for agitation, behavioral problems, confusion, decreased concentration, hallucinations, self-injury and suicidal ideas. The patient is nervous/anxious. The patient is not hyperactive.   All other systems reviewed and are negative.   Last menstrual period 12/13/2017.There is no height or weight on file to calculate BMI.  General Appearance: NA  Eye Contact:  NA  Speech:  Clear and Coherent  Volume:  Normal  Mood:  Depressed  Affect:  NA  Thought Process:  Coherent  Orientation:  Full (Time, Place, and Person)  Thought Content: Logical   Suicidal Thoughts:  No  Homicidal Thoughts:  No  Memory:  Immediate;   Good  Judgement:  Good  Insight:  Fair  Psychomotor Activity:  Normal  Concentration:  Concentration: Good and Attention Span: Good  Recall:  Good  Fund of Knowledge: Good  Language: Good  Akathisia:  No  Handed:  Right  AIMS (if indicated): not done  Assets:  Communication Skills Desire for Improvement  ADL's:  Intact  Cognition: WNL  Sleep:  Fair   Screenings: GAD-7     Office Visit from 08/19/2019 in [place] Visit from 04/16/2018 in [place]  Total GAD-7 Score 11 3    Mini-Mental     Clinical Support from 07/22/2018 in [place]  Total Score (max 30 points ) 30    PHQ2-9     Office Visit from 12/24/2019 in [place] [facility] Office Visit from 09/30/2019 in [place] Office Visit from 09/25/2019 in [place] Office Visit  from 08/19/2019 in [place]  PHQ-2 Total Score 1 0 0 0 1  PHQ-9 Total Score 4 0 3 -- 9       Assessment and Plan:  MONTA MAIORANA is a 54 y.o. year old female with a history of  bipolar disorder by history, hypothyroidism, who presents for follow up appointment for below.    1. MDD (major depressive disorder), recurrent episode, mild ([place]) # Bipolar I disorder by history Although she reports occasionally depressed mood, it has been her baseline, and she denies significant change in her mood since the last visit.  Psychosocial stressors includes marital conflict with her husband in separation at home,and taking care of her mother in [place] dementia.   Although it has been discussed to taper down some of her medication to avoid polypharmacy, patient reports strong preference to stay on the current medication regimen.  Will continue venlafaxine to target depression.  We will continue lamotrigine for mood dysregulation.  Discussed potential risk of Stevens-Johnson syndrome.  We will continue Vraylar and quetiapine to target mood dysregulation.  Discussed potential metabolic side effect and EPS.  Noted that although the patient was reportedly diagnosed with bipolar disorder in the past, both the patient and her husband denies any manic/hypomanic episode except irritability, paranoia in the context of depression.  Plan I have reviewed and updated plans as below 1. Continue Venlafaxine 300 mg daily  2. Continue Lamotrigine 300 mg daily  3. Continue Vraylar 6 mg  4. Continue Quetiapine 300 mg at night  5. Continue Clonazepam 1 mg twice a dayas needed for anxiety - 01/23/2020 6.Next appointment:7/22 at 1 PM for 20 mins, phone 7. Obtain record from your previous psychiatrist- no answer from this provider  Past trials of medication:sertraline, fluoxetine, lexapro,Celexa, duloxetine,Wellbutrinlithium (psoriasis), Depakote(did not work), olanzapine,  Abilify(weight gain), latuda, Geodon  The patient demonstrates the following risk factors for suicide: Chronic risk factors for suicide include:psychiatric disorder ofdepression. Acute risk factorsfor suicide include: family or marital conflict and unemployment. Protective factorsfor this patient include: positive social support and hope for the future. Considering these factors, the overall suicide risk at this point appears to below. Patientisappropriate for outpatient follow up.   Norman Clay, MD 02/18/2020, 1:28 PM

## 2020-02-18 ENCOUNTER — Encounter (HOSPITAL_COMMUNITY): Payer: Self-pay | Admitting: Psychiatry

## 2020-02-18 ENCOUNTER — Telehealth (INDEPENDENT_AMBULATORY_CARE_PROVIDER_SITE_OTHER): Payer: Medicare Other | Admitting: Psychiatry

## 2020-02-18 ENCOUNTER — Other Ambulatory Visit: Payer: Self-pay

## 2020-02-18 DIAGNOSIS — F330 Major depressive disorder, recurrent, mild: Secondary | ICD-10-CM | POA: Diagnosis not present

## 2020-02-18 DIAGNOSIS — F39 Unspecified mood [affective] disorder: Secondary | ICD-10-CM

## 2020-02-18 MED ORDER — LAMOTRIGINE 100 MG PO TABS: 300.0000 mg | ORAL_TABLET | Freq: Every day | ORAL | 0 refills | Status: DC

## 2020-02-18 MED ORDER — VENLAFAXINE HCL ER 150 MG PO CP24: 300.0000 mg | ORAL_CAPSULE | Freq: Every day | ORAL | 0 refills | Status: DC

## 2020-02-18 MED ORDER — QUETIAPINE FUMARATE 300 MG PO TABS: 300.0000 mg | ORAL_TABLET | Freq: Every day | ORAL | 0 refills | Status: DC

## 2020-02-18 MED ORDER — CLONAZEPAM 1 MG PO TABS: 1.0000 mg | ORAL_TABLET | Freq: Two times a day (BID) | ORAL | 1 refills | Status: DC | PRN

## 2020-02-24 ENCOUNTER — Other Ambulatory Visit: Payer: Self-pay | Admitting: Family Medicine

## 2020-02-29 ENCOUNTER — Other Ambulatory Visit: Payer: Self-pay

## 2020-02-29 ENCOUNTER — Ambulatory Visit (INDEPENDENT_AMBULATORY_CARE_PROVIDER_SITE_OTHER): Payer: Medicare Other | Admitting: *Deleted

## 2020-02-29 DIAGNOSIS — Z23 Encounter for immunization: Secondary | ICD-10-CM

## 2020-03-12 DIAGNOSIS — J449 Chronic obstructive pulmonary disease, unspecified: Secondary | ICD-10-CM | POA: Diagnosis not present

## 2020-03-12 DIAGNOSIS — R0902 Hypoxemia: Secondary | ICD-10-CM | POA: Diagnosis not present

## 2020-03-12 DIAGNOSIS — G4733 Obstructive sleep apnea (adult) (pediatric): Secondary | ICD-10-CM | POA: Diagnosis not present

## 2020-03-16 DIAGNOSIS — G2581 Restless legs syndrome: Secondary | ICD-10-CM | POA: Diagnosis not present

## 2020-03-16 DIAGNOSIS — Z8673 Personal history of transient ischemic attack (TIA), and cerebral infarction without residual deficits: Secondary | ICD-10-CM | POA: Diagnosis not present

## 2020-03-16 DIAGNOSIS — R030 Elevated blood-pressure reading, without diagnosis of hypertension: Secondary | ICD-10-CM | POA: Diagnosis not present

## 2020-03-16 DIAGNOSIS — I63232 Cerebral infarction due to unspecified occlusion or stenosis of left carotid arteries: Secondary | ICD-10-CM | POA: Diagnosis not present

## 2020-03-16 DIAGNOSIS — I675 Moyamoya disease: Secondary | ICD-10-CM | POA: Diagnosis not present

## 2020-03-23 ENCOUNTER — Other Ambulatory Visit: Payer: Self-pay | Admitting: Psychiatry

## 2020-03-23 DIAGNOSIS — F39 Unspecified mood [affective] disorder: Secondary | ICD-10-CM

## 2020-03-25 ENCOUNTER — Other Ambulatory Visit: Payer: Self-pay | Admitting: *Deleted

## 2020-03-25 DIAGNOSIS — F39 Unspecified mood [affective] disorder: Secondary | ICD-10-CM

## 2020-03-28 ENCOUNTER — Other Ambulatory Visit: Payer: Self-pay | Admitting: Psychiatry

## 2020-03-28 ENCOUNTER — Telehealth: Payer: Self-pay

## 2020-03-28 DIAGNOSIS — F39 Unspecified mood [affective] disorder: Secondary | ICD-10-CM

## 2020-03-28 MED ORDER — VRAYLAR 6 MG PO CAPS: 1.0000 | ORAL_CAPSULE | Freq: Every day | ORAL | 0 refills | Status: DC

## 2020-03-28 NOTE — Telephone Encounter (Signed)
Ordered

## 2020-03-28 NOTE — Telephone Encounter (Signed)
Pt needs refill on vraylar  Cariprazine HCl (VRAYLAR) 6 MG CAPS Medication Date: 11/26/2019 Department: [facility] Ordering/Authorizing: Norman Clay, MD  Order Providers  Prescribing Provider Encounter Provider  Norman Clay, MD Norman Clay, MD  Outpatient Medication Detail   Disp Refills Start End   Cariprazine HCl (VRAYLAR) 6 MG CAPS 90 capsule 0 11/26/2019    Sig - Route: Take 1 capsule (6 mg total) by mouth daily. - Oral   Sent to pharmacy as: Cariprazine HCl (VRAYLAR) 6 MG Cap   E-Prescribing Status: Receipt confirmed by pharmacy (11/26/2019 1:15 PM EDT)

## 2020-04-11 DIAGNOSIS — R0902 Hypoxemia: Secondary | ICD-10-CM | POA: Diagnosis not present

## 2020-04-11 DIAGNOSIS — G4733 Obstructive sleep apnea (adult) (pediatric): Secondary | ICD-10-CM | POA: Diagnosis not present

## 2020-04-11 DIAGNOSIS — J449 Chronic obstructive pulmonary disease, unspecified: Secondary | ICD-10-CM | POA: Diagnosis not present

## 2020-04-27 IMAGING — CT CT RENAL STONE PROTOCOL
2 of 4 series · 15 of 46 positions shown, 17 images · non-contrast
Comparison: None.

CLINICAL DATA: Left flank pain and vomiting beginning today.
Nephrolithiasis.

EXAM:
CT ABDOMEN AND PELVIS WITHOUT CONTRAST
TECHNIQUE: Multidetector CT imaging of the abdomen and pelvis was performed
following the standard protocol without IV contrast.

[Series 2: axial st · axial · 0.73mm/px · z∈[+706,+1186]mm · 12 of 109 slices shown, 14 images]
[im 7/109  soft-tissue]
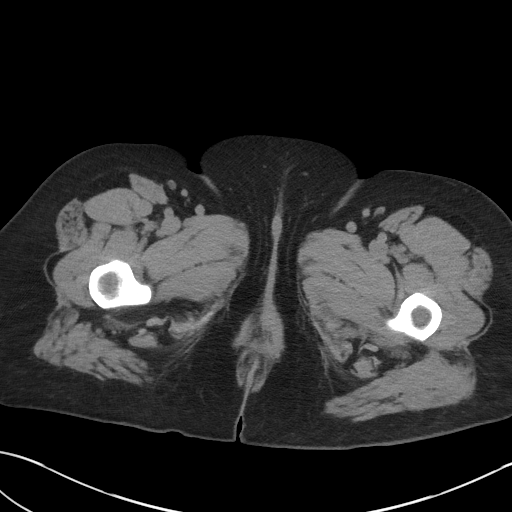
[im 7/109  bone]
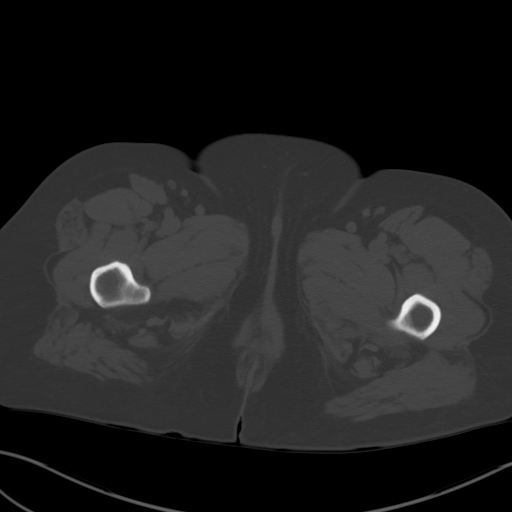
[im 19/109  soft-tissue]
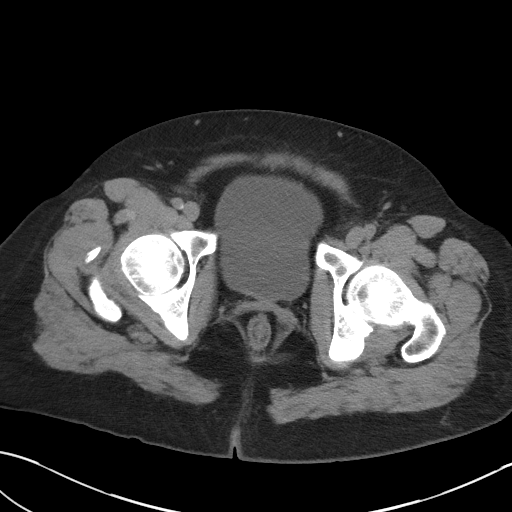
[im 25/109  soft-tissue]
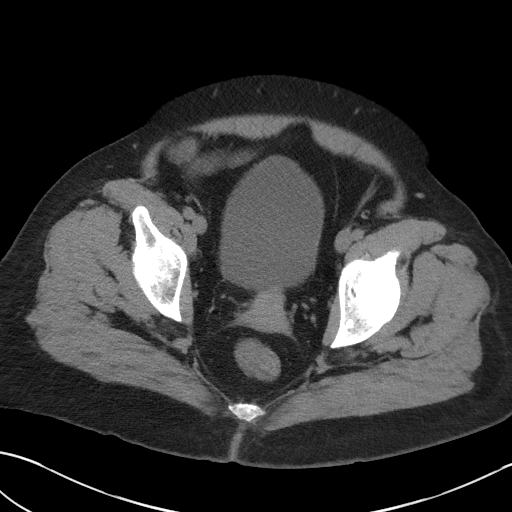
[im 31/109  soft-tissue]
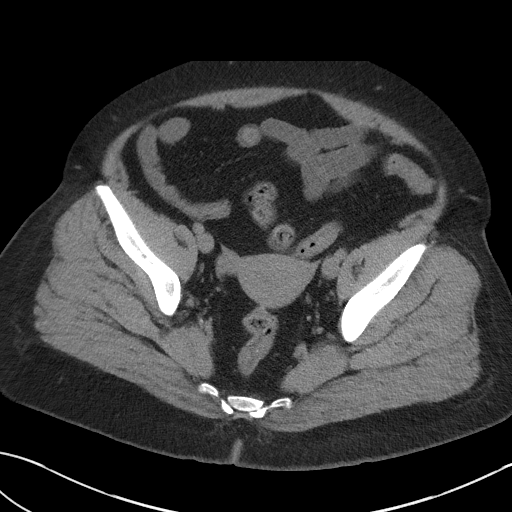
[im 43/109  soft-tissue]
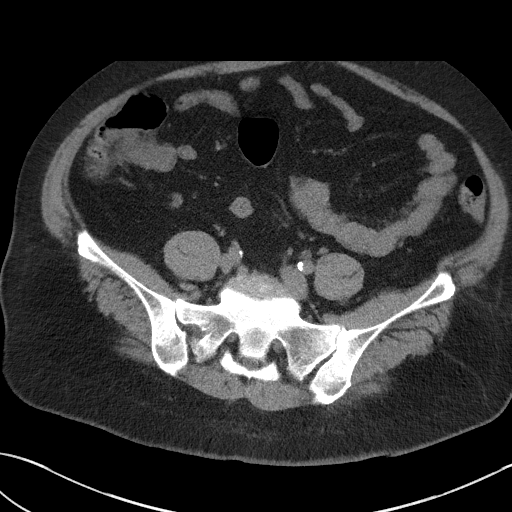
[im 49/109  soft-tissue]
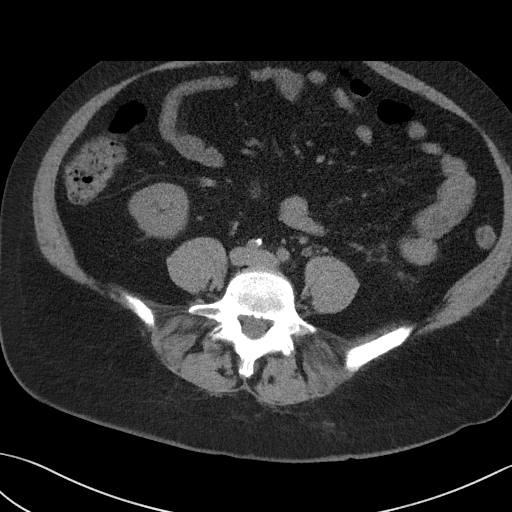
[im 61/109  soft-tissue]
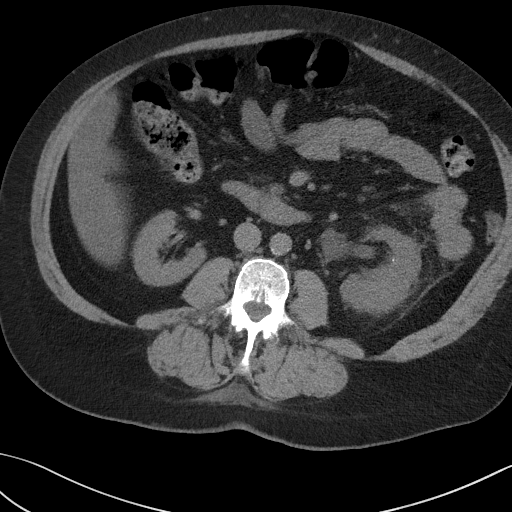
[im 67/109  soft-tissue]
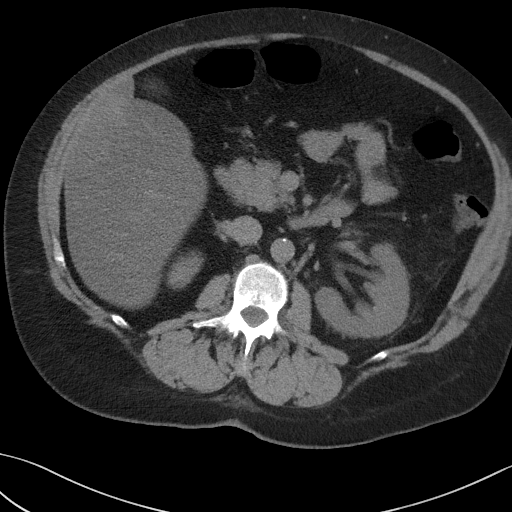
[im 79/109  soft-tissue]
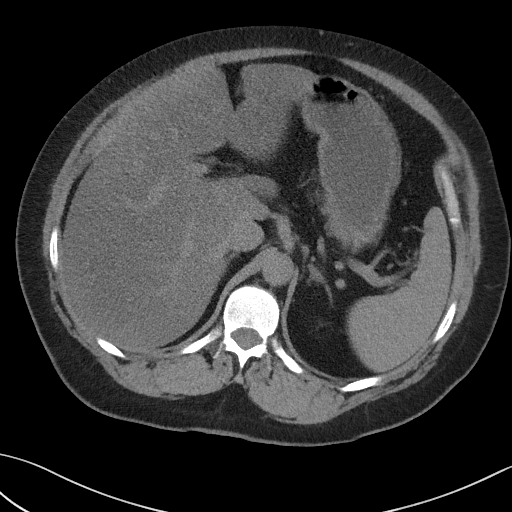
[im 79/109  bone]
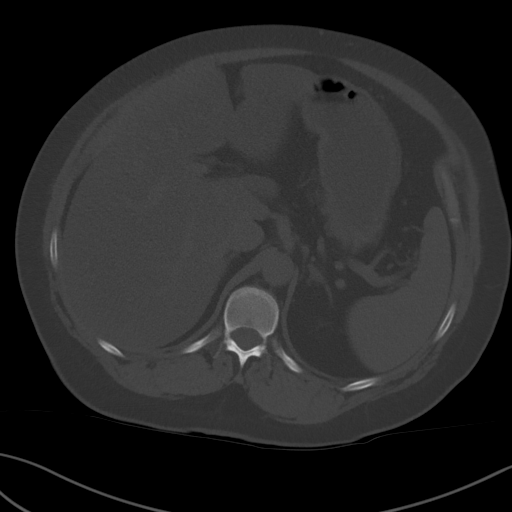
[im 85/109  soft-tissue]
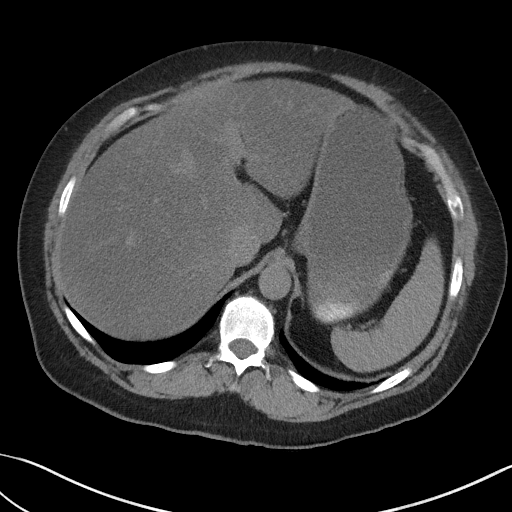
[im 91/109  soft-tissue]
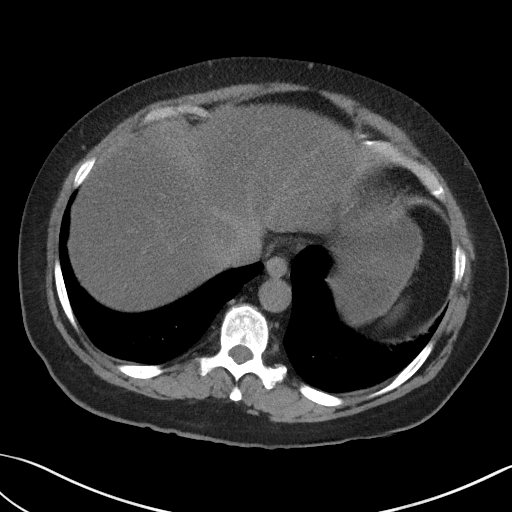
[im 103/109  soft-tissue]
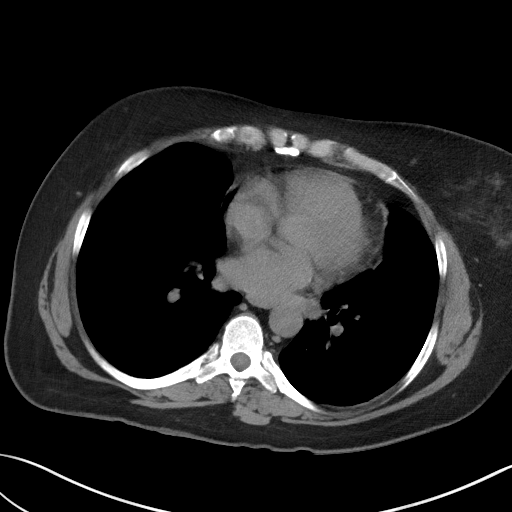

[Series 5: coronal st · coronal · 0.73mm/px · 3 of 105 slices shown]
[im 35/105  soft-tissue]
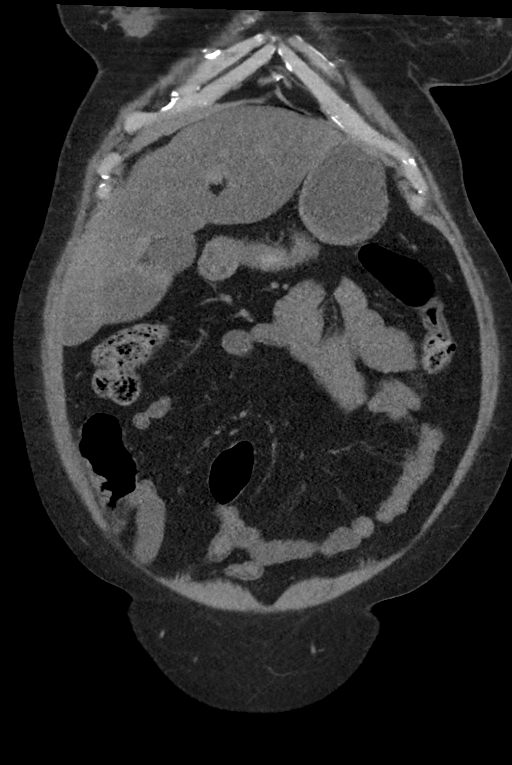
[im 47/105  soft-tissue]
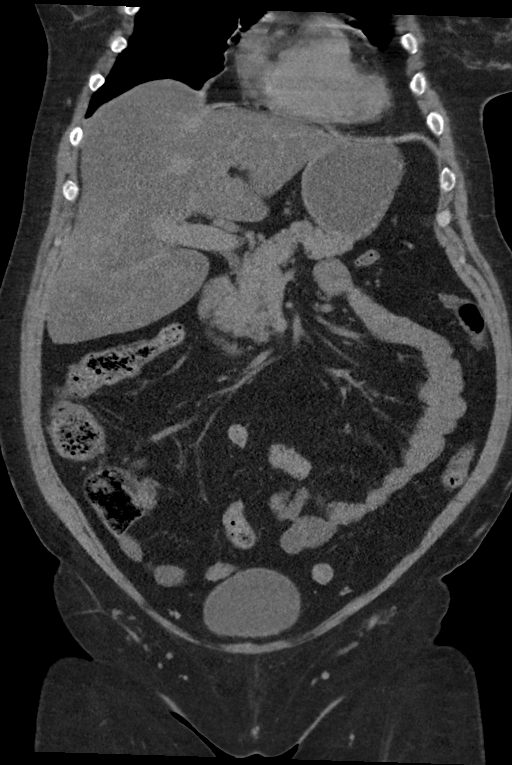
[im 58/105  soft-tissue]
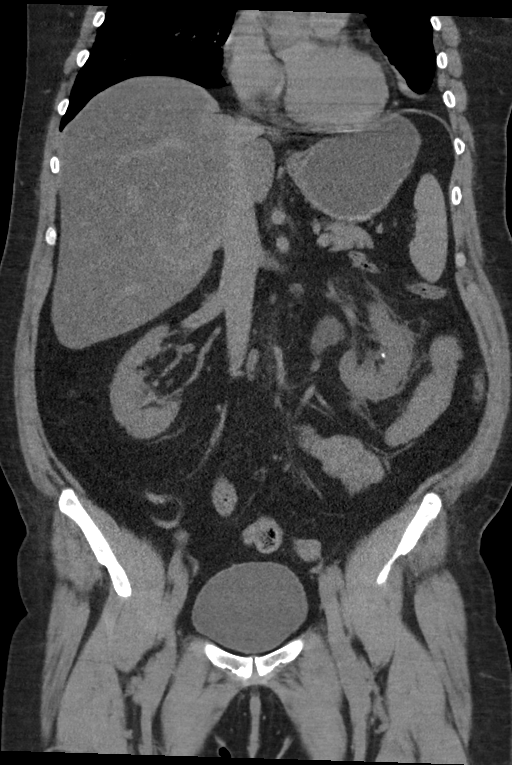

[15 of 46 positions shown; findings below may reference images not displayed]

FINDINGS: Lower chest: No acute findings.

Hepatobiliary: No mass visualized on this unenhanced exam. Severe
hepatic steatosis is seen with focal areas of fatty sparing adjacent
to the gallbladder fossa. Gallbladder is unremarkable. No evidence
of biliary ductal dilatation.

Pancreas: No mass or inflammatory process visualized on this
unenhanced exam.

Spleen:  Within normal limits in size.

Adrenals/Urinary tract: Mild left hydroureteronephrosis is seen due
to a 4 mm calculus in the mid left ureter. A few tiny 2-3 mm left
renal calculi are also noted. Unremarkable unopacified urinary
bladder.

Stomach/Bowel: No evidence of obstruction, inflammatory process, or
abnormal fluid collections. Normal appendix visualized.

Vascular/Lymphatic: No pathologically enlarged lymph nodes
identified. No evidence of abdominal aortic aneurysm. Aortic
atherosclerosis.

Reproductive:  No mass or other significant abnormality.

Other:  None.

Musculoskeletal:  No suspicious bone lesions identified.
IMPRESSION: 1. Mild left hydroureteronephrosis due to 4 mm mid left ureteral
calculus.
2. Tiny left renal calculi.
3. Severe hepatic steatosis.

Aortic Atherosclerosis ([id number]).

## 2020-05-04 ENCOUNTER — Other Ambulatory Visit: Payer: Self-pay | Admitting: Family Medicine

## 2020-05-11 NOTE — Progress Notes (Addendum)
Virtual Visit via Telephone Note  I connected with Rebecca Moreno on 05/19/20 at  1:00 PM EST by telephone and verified that I am speaking with the correct person using two identifiers.  Location: Patient: home Provider: office Persons participated in the visit- patient, provider   I discussed the limitations, risks, security and privacy concerns of performing an evaluation and management service by telephone and the availability of in person appointments. I also discussed with the patient that there may be a patient responsible charge related to this service. The patient expressed understanding and agreed to proceed.   I discussed the assessment and treatment plan with the patient. The patient was provided an opportunity to ask questions and all were answered. The patient agreed with the plan and demonstrated an understanding of the instructions.   The patient was advised to call back or seek an in-person evaluation if the symptoms worsen or if the condition fails to improve as anticipated.  I provided 12 minutes of non-face-to-face time during this encounter.   Norman Clay, MD     [facility] MD/PA/NP OP Progress Note  05/19/2020 2:03 PM Rebecca Moreno  MRN:  [id number]  Chief Complaint:  Chief Complaint    Follow-up; Depression     HPI:  This is a follow-up appointment for depression.  She states that she has been doing good.  She had a good holiday; she spent the time at her sister's house with her niece and nephew.  Although her daughter did not come at that time, she continues to have a good relationship with her daughter.  She goes to the gym a few times per week with her daughter.  She feels stressed, taking care of her mother in law with dementia.  She has fair relationship with her husband.  She has fair sleep.  She occasionally feels depressed.  She has fair concentration.  She denies change in appetite or weight.  She denies SI. She denies decreased need for sleep or  euphonia. .   Daily routine: visits her daughter, who lives 15 miles away, goes to gym 3 times per week with her daughter Employment: on disability after TMJ surgery in 2007. 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 Her parents had marital discordance and the patient screamed when she was a child as she did not want to hear them. She reports good relationship with both of her parents otherwise. She has one sister, who she has good relationship with   Visit Diagnosis:    ICD-10-CM   1. MDD (major depressive disorder), recurrent episode, mild ([place])  F33.0   2. Mood disorder (HCC)  F39 Cariprazine HCl (VRAYLAR) 6 MG CAPS    lamoTRIgine (LAMICTAL) 100 MG tablet    QUEtiapine (SEROQUEL) 300 MG tablet    venlafaxine XR (EFFEXOR-XR) 150 MG 24 hr capsule    Past Psychiatric History: Please see initial evaluation for full details. I have reviewed the history. No updates at this time.     Past Medical History:  Past Medical History:  Diagnosis Date  . Acute pyelonephritis   . Asthma   . Bipolar disorder ([place])   . Colon polyp   . Complication of anesthesia    "sometimes hard to put to sleep"  . Depression    Bipolar  . Gastric ulcer    around 2013.  stress related  .  GERD (gastroesophageal reflux disease)   . History of kidney stones   . Hyperlipidemia   . Hypothyroid   . Polyp, uterus corpus   . Sepsis due to Escherichia coli (E. coli) (HCC) 09/04/2018  . Sleep apnea    no cpap used  . Stroke [facility])     Past Surgical History:  Procedure Laterality Date  . ABLATION ON ENDOMETRIOSIS    . COLONOSCOPY     Per patient, done around 2013 in [place], had polyp and overdue for follow up.  . COLONOSCOPY WITH PROPOFOL N/A 05/12/2018   Procedure: COLONOSCOPY WITH PROPOFOL;  Surgeon: Corbin Ade, MD;  Location: [facility];  Service: Endoscopy;  Laterality: N/A;   12:00pm  . CYSTOSCOPY W/ URETERAL STENT PLACEMENT Left 09/01/2018   Procedure: CYSTOSCOPY WITH RETROGRADE PYELOGRAM/URETERAL STENT PLACEMENT;  Surgeon: Rene Paci, MD;  Location: [facility] ORS;  Service: Urology;  Laterality: Left;  . CYSTOSCOPY/URETEROSCOPY/HOLMIUM LASER/STENT PLACEMENT Left 09/17/2018   Procedure: CYSTOSCOPY/URETEROSCOPY/HOLMIUM LASER/STENT PLACEMENT;  Surgeon: Rene Paci, MD;  Location: [facility];  Service: Urology;  Laterality: Left;  ONLY NEEDS 45 MIN  . OVARIAN CYST SURGERY Left   . POLYPECTOMY  05/12/2018   Procedure: POLYPECTOMY;  Surgeon: Corbin Ade, MD;  Location: [facility];  Service: Endoscopy;;  polyp at splenic flexure  . TEMPOROMANDIBULAR JOINT SURGERY     9 surgeries  . TUBAL LIGATION    . vaginal childbirth     1    Family Psychiatric History: Please see initial evaluation for full details. I have reviewed the history. No updates at this time.     Family History:  Family History  Problem Relation Age of Onset  . Anxiety disorder Mother   . Hypertension Mother   . Heart failure Mother   . Stroke Mother   . Hyperlipidemia Father   . Diabetes Father   . Stroke Father   . Hypertension Father   . Colon polyps Father        older than 60  . Diabetes Sister   . Hypertension Sister   . Ovarian cancer Sister   . Asthma Daughter   . Ovarian cancer Maternal Grandmother   . Colon cancer Maternal Grandmother   . Renal Disease Paternal Grandmother   . Heart attack Paternal Grandfather   . Bipolar disorder Other     Social History:  Social History   Socioeconomic History  . Marital status: Married    Spouse name: Not on file  . Number of children: 1  . Years of education: Not on file  . Highest education level: High school graduate  Occupational History  . Occupation: disability  Tobacco Use  . Smoking status: Former Smoker    Packs/day: 0.50    Years: 30.00    Pack years: 15.00    Types: Cigarettes    Quit date:  09/06/2019    Years since quitting: 0.7  . Smokeless tobacco: Never Used  Vaping Use  . Vaping Use: Never used  Substance and Sexual Activity  . Alcohol use: Never  . Drug use: Not Currently  . Sexual activity: Not Currently    Birth control/protection: Surgical    Comment: tubal  Other Topics Concern  . Not on file  Social History Narrative  . Not on file   Social Determinants of Health   Financial Resource Strain: Low Risk   . Difficulty of Paying Living Expenses: Not very hard  Food Insecurity: Unknown  . Worried About Radiation protection practitioner  of Food in the Last Year: Never true  . Ran Out of Food in the Last Year: Not on file  Transportation Needs: No Transportation Needs  . Lack of Transportation (Medical): No  . Lack of Transportation (Non-Medical): No  Physical Activity: Sufficiently Active  . Days of Exercise per Week: 5 days  . Minutes of Exercise per Session: 90 min  Stress: Not on file  Social Connections: Socially Integrated  . Frequency of Communication with Friends and Family: Three times a week  . Frequency of Social Gatherings with Friends and Family: More than three times a week  . Attends Religious Services: 1 to 4 times per year  . Active Member of Clubs or Organizations: Not on file  . Attends Archivist Meetings: More than 4 times per year  . Marital Status: Married    Allergies:  Allergies  Allergen Reactions  . Lithium Other (See Comments)    Psoriasis     Metabolic Disorder Labs: No results found for: HGBA1C, MPG No results found for: PROLACTIN Lab Results  Component Value Date   CHOL 212 (H) 12/18/2018   TRIG 171 (H) 12/18/2018   HDL 44 12/18/2018   CHOLHDL 4.8 (H) 12/18/2018   LDLCALC 134 (H) 12/18/2018   LDLCALC 168 (H) 01/15/2018   Lab Results  Component Value Date   TSH 0.718 12/24/2019   TSH 2.580 08/19/2019    Therapeutic Level Labs: No results found for: LITHIUM No results found for: VALPROATE No components found for:   CBMZ  Current Medications: Current Outpatient Medications  Medication Sig Dispense Refill  . Acetaminophen-Codeine 300-30 MG tablet acetaminophen 300 mg-codeine 30 mg tablet  TAKE 1 TABLET BY MOUTH EVERY 4 HOURS AS NEEDED FOR PAIN    . albuterol (PROVENTIL HFA;VENTOLIN HFA) 108 (90 Base) MCG/ACT inhaler Inhale 1 puff into the lungs every 6 (six) hours as needed for wheezing or shortness of breath.    Marland Kitchen aspirin 325 MG tablet Take 1 tablet by mouth daily.    Marland Kitchen atorvastatin (LIPITOR) 80 MG tablet Take 1 tablet (80 mg total) by mouth at bedtime. (Needs to be seen before next refill) 30 tablet 0  . CALCIUM CITRATE-VITAMIN D3 PO Take 1 tablet by mouth daily.    Derrill Memo ON 06/28/2020] Cariprazine HCl (VRAYLAR) 6 MG CAPS Take 1 capsule (6 mg total) by mouth daily. 90 capsule 0  . Cholecalciferol (VITAMIN D3) 5000 units TABS Take 5,000 Units by mouth daily.     Derrill Memo ON 06/16/2020] clonazePAM (KLONOPIN) 1 MG tablet Take 1 tablet (1 mg total) by mouth 2 (two) times daily as needed for anxiety. 60 tablet 2  . DEXILANT 30 MG capsule TAKE (1) CAPSULE DAILY 90 capsule 3  . famotidine (PEPCID) 20 MG tablet TAKE 1 TABLET DAILY 90 tablet 3  . fluticasone (FLONASE) 50 MCG/ACT nasal spray SPRAY 2 SPRAYS INTO EACH NOSTRIL EVERY DAY 48 mL 0  . fluticasone (FLOVENT HFA) 220 MCG/ACT inhaler Inhale 1 puff into the lungs daily.     Marland Kitchen lamoTRIgine (LAMICTAL) 100 MG tablet Take 3 tablets (300 mg total) by mouth at bedtime. 270 tablet 0  . levothyroxine (SYNTHROID) 50 MCG tablet Take 1 tablet (50 mcg total) by mouth daily. 30 tablet 11  . loratadine (CLARITIN) 10 MG tablet TAKE 1 TABLET BY MOUTH EVERY DAY 30 tablet 5  . magnesium gluconate (MAGONATE) 500 MG tablet Take 500 mg by mouth daily.    . Multiple Vitamins-Minerals (MULTIVITAMIN WOMEN PO) Take 1  tablet by mouth daily.    . QUEtiapine (SEROQUEL) 300 MG tablet Take 1 tablet (300 mg total) by mouth at bedtime. 90 tablet 0  . varenicline (CHANTIX CONTINUING MONTH  PAK) 1 MG tablet Take 1 tablet (1 mg total) by mouth 2 (two) times daily. 180 tablet 1  . venlafaxine XR (EFFEXOR-XR) 150 MG 24 hr capsule Take 2 capsules (300 mg total) by mouth daily. 180 capsule 0   No current facility-administered medications for this visit.     Musculoskeletal: Strength & Muscle Tone: N/A Gait & Station: N/A Patient leans: N/A  Psychiatric Specialty Exam: Review of Systems  Psychiatric/Behavioral: Positive for dysphoric mood. Negative for agitation, behavioral problems, confusion, decreased concentration, hallucinations, self-injury, sleep disturbance and suicidal ideas. The patient is nervous/anxious. The patient is not hyperactive.   All other systems reviewed and are negative.   Last menstrual period 12/13/2017.There is no height or weight on file to calculate BMI.  General Appearance: NA  Eye Contact:  NA  Speech:  Clear and Coherent  Volume:  Normal  Mood:  good  Affect:  NA  Thought Process:  Coherent  Orientation:  Full (Time, Place, and Person)  Thought Content: Logical   Suicidal Thoughts:  No  Homicidal Thoughts:  No  Memory:  Immediate;   Good  Judgement:  Good  Insight:  Fair  Psychomotor Activity:  Normal  Concentration:  Concentration: Good and Attention Span: Good  Recall:  Good  Fund of Knowledge: Good  Language: Good  Akathisia:  No  Handed:  Right  AIMS (if indicated): not done  Assets:  Communication Skills Desire for Improvement  ADL's:  Intact  Cognition: WNL  Sleep:  Good   Screenings: GAD-7   Flowsheet Row Office Visit from 08/19/2019 in [place] Office Visit from 04/16/2018 in [place]  Total GAD-7 Score 11 3    Mini-Mental   Flowsheet Row Clinical Support from 07/22/2018 in [place]  Total Score (max 30 points ) 30    PHQ2-9   [place] Visit from 12/24/2019 in [place] from 10/07/2019 in  [facility] Office Visit from 09/30/2019 in [place] Office Visit from 09/25/2019 in [place] Office Visit from 08/19/2019 in [place]  PHQ-2 Total Score 1 0 0 0 1  PHQ-9 Total Score 4 0 3 -- 9     09/2019 CHOLESTEROL TOTAL 100 - 199 mg/dL 206High   Trig 0 - 149 mg/dL 116   HDL >=39 mg/dL 44   LDL 0 - 99 mg/dL 139High   VLDL 5 - 40 mg/dl 23   CHOL/HDL 0 - 5 5      Assessment and Plan:  NELSA KUTZLER is a 55 y.o. year old female with a history of bipolar disorder by history, hypothyroidism, who presents for follow up appointment for below.   1. Mood disorder ([place]) 2. MDD (major depressive disorder), recurrent episode, mild ([place]) # Bipolar I disorder by history Although she continues to have occasional depressed mood, it has been her baseline, and she denies significant change in her mood since the last visit.  Psychosocial stressors includes marital conflict with her husband in separation at home, taking care of her mother-in-law with dementia.  Although it has been discussed at least several times regarding the concern of her polypharmacy, the patient has strong preference to stay on the current medication regimen.  Will continue venlafaxine to target depression.  We will continue lamotrigine for mood dysregulation.  She is aware of its potential risk of Stevens-Johnson syndrome.  Will continue Vraylar and quetiapine to target mood dysregulation.  Discussed potential metabolic side effect and EPS.  Will continue clonazepam as needed for anxiety. Noted that although the patient was reportedly diagnosed with bipolar I disorder in the past,both the patient and her husband denies any manic/hypomanic episode except irritability,paranoia in the context of depression.    Plan I have reviewed and updated plans as below 1. Continue Venlafaxine 300 mg daily  2. Continue  Lamotrigine 300 mg daily  3. Continue Vraylar 6 mg  4. Continue Quetiapine 300 mg at night  5. Continue Clonazepam 1 mg twice a dayas needed for anxiety 6.Next appointment:4/14 at 1:20 for 20 mins, phone 7. Obtain record from your previous psychiatrist- no answer from this provider - Will advise the patient to obtain lipid panels/glucose at the next visit    Past trials of medication:sertraline, fluoxetine, lexapro,Celexa, duloxetine,Wellbutrinlithium (psoriasis), Depakote(did not work), olanzapine, Abilify(weight gain), latuda, Geodon  The patient demonstrates the following risk factors for suicide: Chronic risk factors for suicide include:psychiatric disorder ofdepression. Acute risk factorsfor suicide include: family or marital conflict and unemployment. Protective factorsfor this patient include: positive social support and hope for the future. Considering these factors, the overall suicide risk at this point appears to below. Patientisappropriate for outpatient follow up.    Norman Clay, MD 05/19/2020, 2:03 PM

## 2020-05-19 ENCOUNTER — Other Ambulatory Visit: Payer: Self-pay

## 2020-05-19 ENCOUNTER — Telehealth (INDEPENDENT_AMBULATORY_CARE_PROVIDER_SITE_OTHER): Payer: Medicaid Other | Admitting: Psychiatry

## 2020-05-19 ENCOUNTER — Encounter: Payer: Self-pay | Admitting: Psychiatry

## 2020-05-19 DIAGNOSIS — F330 Major depressive disorder, recurrent, mild: Secondary | ICD-10-CM

## 2020-05-19 DIAGNOSIS — F39 Unspecified mood [affective] disorder: Secondary | ICD-10-CM | POA: Diagnosis not present

## 2020-05-19 MED ORDER — VRAYLAR 6 MG PO CAPS: 1.0000 | ORAL_CAPSULE | Freq: Every day | ORAL | 0 refills | Status: DC

## 2020-05-19 MED ORDER — QUETIAPINE FUMARATE 300 MG PO TABS: 300.0000 mg | ORAL_TABLET | Freq: Every day | ORAL | 0 refills | Status: DC

## 2020-05-19 MED ORDER — LAMOTRIGINE 100 MG PO TABS: 300.0000 mg | ORAL_TABLET | Freq: Every day | ORAL | 0 refills | Status: DC

## 2020-05-19 MED ORDER — VENLAFAXINE HCL ER 150 MG PO CP24: 300.0000 mg | ORAL_CAPSULE | Freq: Every day | ORAL | 0 refills | Status: DC

## 2020-05-19 MED ORDER — CLONAZEPAM 1 MG PO TABS: 1.0000 mg | ORAL_TABLET | Freq: Two times a day (BID) | ORAL | 2 refills | Status: DC | PRN

## 2020-05-19 NOTE — Patient Instructions (Signed)
1. Continue Venlafaxine 300 mg daily  2. Continue Lamotrigine 300 mg daily  3. Continue Vraylar 6 mg  4. Continue Quetiapine 300 mg at night  5. Continue Clonazepam 1 mg twice a dayas needed for anxiety 6.Next appointment:4/14 at 1:20

## 2020-06-01 ENCOUNTER — Ambulatory Visit (INDEPENDENT_AMBULATORY_CARE_PROVIDER_SITE_OTHER): Payer: 59 | Admitting: Nurse Practitioner

## 2020-06-01 ENCOUNTER — Other Ambulatory Visit: Payer: Self-pay

## 2020-06-01 ENCOUNTER — Encounter: Payer: Self-pay | Admitting: Nurse Practitioner

## 2020-06-01 VITALS — BP 104/66 | HR 84 | Temp 97.8°F | Ht 66.0 in | Wt 195.8 lb

## 2020-06-01 DIAGNOSIS — Z720 Tobacco use: Secondary | ICD-10-CM | POA: Diagnosis not present

## 2020-06-01 DIAGNOSIS — L409 Psoriasis, unspecified: Secondary | ICD-10-CM

## 2020-06-01 MED ORDER — COAL TAR EXTRACT 3 % EX SHAM: 1.0000 "application " | MEDICATED_SHAMPOO | Freq: Every morning | CUTANEOUS | 1 refills | Status: DC

## 2020-06-01 MED ORDER — VARENICLINE TARTRATE 1 MG PO TABS
1.0000 mg | ORAL_TABLET | Freq: Two times a day (BID) | ORAL | 1 refills | Status: DC
Start: 2020-06-01 — End: 2021-02-20

## 2020-06-01 MED ORDER — HYDROCORTISONE 1 % EX LOTN: 1.0000 "application " | TOPICAL_LOTION | Freq: Two times a day (BID) | CUTANEOUS | 0 refills | Status: DC

## 2020-06-01 MED ORDER — AQUAPHOR EX OINT: TOPICAL_OINTMENT | CUTANEOUS | 0 refills | Status: DC | PRN

## 2020-06-01 NOTE — Progress Notes (Signed)
Acute Office Visit  Subjective:    Patient ID: Rebecca Moreno, female    DOB: November 05, 1965, 55 y.o.   MRN: [id number]  Chief Complaint  Patient presents with  . Psoriasis   Patient is 55 year old female who presents to clinic for psoriasis.  Rash present on face bilateral upper extremities and scalp.  Patient reports symptoms worsened in the last week.  Patient has used over-the-counter moisturizers with no therapeutic effect.  Patient does not report any fever, nausea, headache or fever.  Past Medical History:  Diagnosis Date  . Acute pyelonephritis   . Asthma   . Bipolar disorder ([place])   . Colon polyp   . Complication of anesthesia    "sometimes hard to put to sleep"  . Depression    Bipolar  . Gastric ulcer    around 2013.  stress related  . GERD (gastroesophageal reflux disease)   . History of kidney stones   . Hyperlipidemia   . Hypothyroid   . Polyp, uterus corpus   . Sepsis due to Escherichia coli (E. coli) ([place]) 09/04/2018  . Sleep apnea    no cpap used  . Stroke [facility] [place])     Past Surgical History:  Procedure Laterality Date  . ABLATION ON ENDOMETRIOSIS    . COLONOSCOPY     Per patient, done around 2013 in [place], had polyp and overdue for follow up.  . COLONOSCOPY WITH PROPOFOL N/A 05/12/2018   Procedure: COLONOSCOPY WITH PROPOFOL;  Surgeon: Daneil Dolin, MD;  Location: [facility];  Service: Endoscopy;  Laterality: N/A;  12:00pm  . CYSTOSCOPY W/ URETERAL STENT PLACEMENT Left 09/01/2018   Procedure: CYSTOSCOPY WITH RETROGRADE PYELOGRAM/URETERAL STENT PLACEMENT;  Surgeon: Ceasar Mons, MD;  Location: [facility];  Service: Urology;  Laterality: Left;  . CYSTOSCOPY/URETEROSCOPY/HOLMIUM LASER/STENT PLACEMENT Left 09/17/2018   Procedure: CYSTOSCOPY/URETEROSCOPY/HOLMIUM LASER/STENT PLACEMENT;  Surgeon: Ceasar Mons, MD;  Location: [facility];  Service: Urology;  Laterality: Left;  ONLY NEEDS 45 MIN  . OVARIAN CYST SURGERY Left   . POLYPECTOMY   05/12/2018   Procedure: POLYPECTOMY;  Surgeon: Daneil Dolin, MD;  Location: [facility];  Service: Endoscopy;;  polyp at splenic flexure  . TEMPOROMANDIBULAR JOINT SURGERY     9 surgeries  . TUBAL LIGATION    . vaginal childbirth     1    Family History  Problem Relation Age of Onset  . Anxiety disorder Mother   . Hypertension Mother   . Heart failure Mother   . Stroke Mother   . Hyperlipidemia Father   . Diabetes Father   . Stroke Father   . Hypertension Father   . Colon polyps Father        older than 38  . Diabetes Sister   . Hypertension Sister   . Ovarian cancer Sister   . Asthma Daughter   . Ovarian cancer Maternal Grandmother   . Colon cancer Maternal Grandmother   . Renal Disease Paternal Grandmother   . Heart attack Paternal Grandfather   . Bipolar disorder Other     Social History   Socioeconomic History  . Marital status: Married    Spouse name: Not on file  . Number of children: 1  . Years of education: Not on file  . Highest education level: High school graduate  Occupational History  . Occupation: disability  Tobacco Use  . Smoking status: Former Smoker    Packs/day: 0.50    Years: 30.00    Pack years:  15.00    Types: Cigarettes    Quit date: 09/06/2019    Years since quitting: 0.7  . Smokeless tobacco: Never Used  Vaping Use  . Vaping Use: Never used  Substance and Sexual Activity  . Alcohol use: Never  . Drug use: Not Currently  . Sexual activity: Not Currently    Birth control/protection: Surgical    Comment: tubal  Other Topics Concern  . Not on file  Social History Narrative  . Not on file   Social Determinants of Health   Financial Resource Strain: Low Risk   . Difficulty of Paying Living Expenses: Not very hard  Food Insecurity: Unknown  . Worried About Charity fundraiser in the Last Year: Never true  . Ran Out of Food in the Last Year: Not on file  Transportation Needs: No Transportation Needs  . Lack of Transportation  (Medical): No  . Lack of Transportation (Non-Medical): No  Physical Activity: Sufficiently Active  . Days of Exercise per Week: 5 days  . Minutes of Exercise per Session: 90 min  Stress: Not on file  Social Connections: Socially Integrated  . Frequency of Communication with Friends and Family: Three times a week  . Frequency of Social Gatherings with Friends and Family: More than three times a week  . Attends Religious Services: 1 to 4 times per year  . Active Member of Clubs or Organizations: Not on file  . Attends Archivist Meetings: More than 4 times per year  . Marital Status: Married  Human resources officer Violence: Not At Risk  . Fear of Current or Ex-Partner: No  . Emotionally Abused: No  . Physically Abused: No  . Sexually Abused: No    Outpatient Medications Prior to Visit  Medication Sig Dispense Refill  . Acetaminophen-Codeine 300-30 MG tablet acetaminophen 300 mg-codeine 30 mg tablet  TAKE 1 TABLET BY MOUTH EVERY 4 HOURS AS NEEDED FOR PAIN    . albuterol (PROVENTIL HFA;VENTOLIN HFA) 108 (90 Base) MCG/ACT inhaler Inhale 1 puff into the lungs every 6 (six) hours as needed for wheezing or shortness of breath.    Marland Kitchen aspirin 325 MG tablet Take 1 tablet by mouth daily.    Marland Kitchen atorvastatin (LIPITOR) 80 MG tablet Take 1 tablet (80 mg total) by mouth at bedtime. (Needs to be seen before next refill) 30 tablet 0  . CALCIUM CITRATE-VITAMIN D3 PO Take 1 tablet by mouth daily.    Derrill Memo ON 06/28/2020] Cariprazine HCl (VRAYLAR) 6 MG CAPS Take 1 capsule (6 mg total) by mouth daily. 90 capsule 0  . Cholecalciferol (VITAMIN D3) 5000 units TABS Take 5,000 Units by mouth daily.     Derrill Memo ON 06/16/2020] clonazePAM (KLONOPIN) 1 MG tablet Take 1 tablet (1 mg total) by mouth 2 (two) times daily as needed for anxiety. 60 tablet 2  . DEXILANT 30 MG capsule TAKE (1) CAPSULE DAILY 90 capsule 3  . famotidine (PEPCID) 20 MG tablet TAKE 1 TABLET DAILY 90 tablet 3  . fluticasone (FLONASE) 50  MCG/ACT nasal spray SPRAY 2 SPRAYS INTO EACH NOSTRIL EVERY DAY 48 mL 0  . fluticasone (FLOVENT HFA) 220 MCG/ACT inhaler Inhale 1 puff into the lungs daily.     Marland Kitchen lamoTRIgine (LAMICTAL) 100 MG tablet Take 3 tablets (300 mg total) by mouth at bedtime. 270 tablet 0  . levothyroxine (SYNTHROID) 50 MCG tablet Take 1 tablet (50 mcg total) by mouth daily. 30 tablet 11  . loratadine (CLARITIN) 10 MG tablet TAKE  1 TABLET BY MOUTH EVERY DAY 30 tablet 5  . magnesium gluconate (MAGONATE) 500 MG tablet Take 500 mg by mouth daily.    . Multiple Vitamins-Minerals (MULTIVITAMIN WOMEN PO) Take 1 tablet by mouth daily.    . QUEtiapine (SEROQUEL) 300 MG tablet Take 1 tablet (300 mg total) by mouth at bedtime. 90 tablet 0  . venlafaxine XR (EFFEXOR-XR) 150 MG 24 hr capsule Take 2 capsules (300 mg total) by mouth daily. 180 capsule 0  . varenicline (CHANTIX CONTINUING MONTH PAK) 1 MG tablet Take 1 tablet (1 mg total) by mouth 2 (two) times daily. 180 tablet 1   No facility-administered medications prior to visit.    Allergies  Allergen Reactions  . Lithium Other (See Comments)    Psoriasis     Review of Systems  Constitutional: Negative.   HENT: Negative.   Eyes: Negative.   Respiratory: Negative.   Gastrointestinal: Negative.   Genitourinary: Negative.   Musculoskeletal: Negative.   Skin: Positive for rash.  Neurological: Negative.   All other systems reviewed and are negative.      Objective:    Physical Exam Vitals reviewed.  Constitutional:      Appearance: Normal appearance.  HENT:     Head: Normocephalic.     Nose: Nose normal.  Eyes:     Conjunctiva/sclera: Conjunctivae normal.  Cardiovascular:     Pulses: Normal pulses.     Heart sounds: Normal heart sounds.  Pulmonary:     Effort: Pulmonary effort is normal.     Breath sounds: Normal breath sounds.  Abdominal:     General: Bowel sounds are normal.  Musculoskeletal:        General: Normal range of motion.  Skin:     Findings: Erythema and rash present.  Neurological:     Mental Status: She is alert and oriented to person, place, and time.     BP 104/66   Pulse 84   Temp 97.8 F (36.6 C)   Ht 5\' 6"  (1.676 m)   Wt 195 lb 12.8 oz (88.8 kg)   LMP 12/13/2017 (Approximate)   SpO2 97%   BMI 31.60 kg/m  Wt Readings from Last 3 Encounters:  06/01/20 195 lb 12.8 oz (88.8 kg)  01/14/20 201 lb 12.8 oz (91.5 kg)  12/24/19 201 lb 6.4 oz (91.4 kg)    There are no preventive care reminders to display for this patient.  There are no preventive care reminders to display for this patient.   Lab Results  Component Value Date   TSH 0.718 12/24/2019   Lab Results  Component Value Date   WBC 12.2 (H) 01/14/2020   HGB 15.7 (H) 01/14/2020   HCT 47.7 (H) 01/14/2020   MCV 104.6 (H) 01/14/2020   PLT 238 01/14/2020   Lab Results  Component Value Date   NA 142 12/24/2019   K 4.4 12/24/2019   CO2 30 (H) 12/24/2019   GLUCOSE 102 (H) 12/24/2019   BUN 13 12/24/2019   CREATININE 0.82 12/24/2019   BILITOT 0.2 12/24/2019   ALKPHOS 132 (H) 12/24/2019   AST 15 12/24/2019   ALT 23 12/24/2019   PROT 5.9 (L) 12/24/2019   ALBUMIN 4.3 12/24/2019   CALCIUM 9.2 12/24/2019   ANIONGAP 8 01/05/2019   Lab Results  Component Value Date   CHOL 212 (H) 12/18/2018   Lab Results  Component Value Date   HDL 44 12/18/2018   Lab Results  Component Value Date   LDLCALC 134 (H) 12/18/2018  Lab Results  Component Value Date   TRIG 171 (H) 12/18/2018   Lab Results  Component Value Date   CHOLHDL 4.8 (H) 12/18/2018   No results found for: HGBA1C     Assessment & Plan:   Problem List Items Addressed This Visit      Musculoskeletal and Integument   Psoriasis - Primary    Worsening psoriasis plaque on patient's scalp, face and  bilateral upper extremities.  Patient is reporting worsening symptoms in the last 7 days with increased itching, dry skin and scaling.  Started patient on 1% hydrocortisone cream,  advised increase moisturizing with Aquaphor, call to shampoo for scalp.  And follow-up 4 to 6 weeks.  If symptoms not improving or worsening may refer to dermatology for reassessment and treatment.        Other   Tobacco use    Patient is willing to work on tobacco cessation.  Patient reports reducing amount of cigarettes by 6 cigarettes/day.  Re-filled Chantix.  Provided education to patient with printed handouts given.  Follow-up with unresolved symptoms.      Relevant Medications   varenicline (CHANTIX CONTINUING MONTH PAK) 1 MG tablet       Meds ordered this encounter  Medications  . Coal Tar Extract 3 % SHAM    Sig: Apply 1 application topically every morning.    Dispense:  240 mL    Refill:  1    Order Specific Question:   Supervising Provider    Answer:   [name] [[id number]]  . hydrocortisone 1 % lotion    Sig: Apply 1 application topically 2 (two) times daily.    Dispense:  118 mL    Refill:  0    Order Specific Question:   Supervising Provider    Answer:   [name] [[id number]]  . mineral oil-hydrophilic petrolatum (AQUAPHOR) ointment    Sig: Apply topically as needed for dry skin.    Dispense:  420 g    Refill:  0    Order Specific Question:   Supervising Provider    Answer:   [name] [[id number]]  . varenicline (CHANTIX CONTINUING MONTH PAK) 1 MG tablet    Sig: Take 1 tablet (1 mg total) by mouth 2 (two) times daily.    Dispense:  180 tablet    Refill:  1    Order Specific Question:   Supervising Provider    Answer:   [name] [[id number]]     Ivy Lynn, NP

## 2020-06-01 NOTE — Patient Instructions (Signed)
Psoriasis Psoriasis is a long-term (chronic) skin condition. It occurs because your body's defense system (immune system) causes skin cells to form too quickly. This causes raised, red patches (plaques) on your skin that look silvery. The patches may be on all areas of your body. They can be any size or shape. Psoriasis can come and go. It can range from mild to very bad. It cannot be passed from one person to another (is not contagious). There is no cure for this condition, but it can be helped with treatment. What are the causes? The cause of psoriasis is not known. Some things can make it worse. These are:  Skin damage, such as cuts, scrapes, sunburn, and dryness.  Not getting enough sunlight.  Some medicines.  Alcohol.  Tobacco.  Stress.  Infections. What increases the risk?  Having a family member with psoriasis.  Being very overweight (obese).  Being 6-75 years old.  Taking certain medicines. What are the signs or symptoms? There are different types of psoriasis. The types are:  Plaque. This is the most common. Symptoms include red, raised patches with a silvery coating. These may be itchy. Your nails may be crumbly or fall off.  Guttate. Symptoms include small red spots on your stomach area, arms, and legs. These may happen after you have been sick, such as with strep throat.  Inverse. Symptoms include patches in your armpits, under your breasts, private areas, or on your butt.  Pustular. Symptoms include pus-filled bumps on the palms of your hands or the soles of your feet. You also may feel very tired, weak, have a fever, and not be hungry.  Erythrodermic. Symptoms include bright red skin that looks burned. You may have a fast heartbeat and a body temperature that is too high or too low. You may be itchy or in pain.  Sebopsoriasis. Symptoms include red patches on your scalp, forehead, and face that are greasy.  Psoriatic arthritis. Symptoms include swollen,  painful joints along with scaly skin patches.   How is this treated? There is no cure for this condition, but treatment can:  Help your skin heal.  Lessen itching and irritation and swelling (inflammation).  Slow the growth of new skin cells.  Help your body's defense system respond better to your skin. Treatment may include:  Creams or ointments.  Light therapy. This may include natural sunlight or light therapy in a doctor's office.  Medicines. These can help your body better manage skin cells. They may be used with light therapy or ointments. Medicines may include pills or injections. You may also get antibiotic medicines if you have an infection. Follow these instructions at home: Skin Care  Apply lotion to your skin as needed. Only use those that your doctor has said are okay.  Apply cool, wet cloths (cold compresses) to the affected areas.  Do not use a hot tub or take hot showers. Use slightly warm, not hot, water when taking showers and baths.  Do not scratch your skin. Lifestyle  Do not use any products that contain nicotine or tobacco, such as cigarettes, e-cigarettes, and chewing tobacco. If you need help quitting, ask your doctor.  Lower your stress.  Keep a healthy weight.  Go out in the sun as told by your doctor. Do not get sunburned.  Join a support group.   Medicines  Take or use over-the-counter and prescription medicines only as told by your doctor.  If you were prescribed an antibiotic medicine, take it as told  by your doctor. Do not stop using the antibiotic even if you start to feel better. Alcohol use If you drink alcohol:  Limit how much you use: ? 0-1 drink a day for women. ? 0-2 drinks a day for men.  Be aware of how much alcohol is in your drink. In the [place] one drink equals one 12 oz bottle of beer (355 mL), one 5 oz glass of wine (148 mL), or one 1 oz glass of hard liquor (44 mL). General instructions  Keep a journal to track the  things that cause symptoms (triggers). Try to avoid these things.  See a counselor if you feel the support would help.  Keep all follow-up visits as told by your doctor. This is important. Contact a doctor if:  You have a fever.  Your pain gets worse.  You have more redness or warmth in the affected areas.  You have new or worse pain or stiffness in your joints.  Your nails start to break easily or pull away from the nail bed.  You feel very sad (depressed). Summary  Psoriasis is a long-term (chronic) skin condition.  There is no cure for this condition, but treatment can help manage it.  Keep a journal to track the things that cause symptoms.  Take or use over-the-counter and prescription medicines only as told by your doctor.  Keep all follow-up visits as told by your doctor. This is important. This information is not intended to replace advice given to you by your health care provider. Make sure you discuss any questions you have with your health care provider. Document Revised: 02/25/2018 Document Reviewed: 02/25/2018 Elsevier Patient Education  2021 [employer]  

## 2020-06-01 NOTE — Assessment & Plan Note (Signed)
Patient is willing to work on tobacco cessation.  Patient reports reducing amount of cigarettes by 6 cigarettes/day.  Re-filled Chantix.  Provided education to patient with printed handouts given.  Follow-up with unresolved symptoms.

## 2020-06-01 NOTE — Assessment & Plan Note (Signed)
Worsening psoriasis plaque on patient's scalp, face and  bilateral upper extremities.  Patient is reporting worsening symptoms in the last 7 days with increased itching, dry skin and scaling.  Started patient on 1% hydrocortisone cream, advised increase moisturizing with Aquaphor, call to shampoo for scalp.  And follow-up 4 to 6 weeks.  If symptoms not improving or worsening may refer to dermatology for reassessment and treatment.

## 2020-06-22 ENCOUNTER — Telehealth: Payer: Self-pay

## 2020-06-22 ENCOUNTER — Other Ambulatory Visit: Payer: Self-pay | Admitting: Family Medicine

## 2020-06-22 NOTE — Telephone Encounter (Signed)
received fax requesting a refill on the vraylar 6mg  . but according to the rx it has already been sent will notifiy pharmacy.

## 2020-06-29 ENCOUNTER — Other Ambulatory Visit: Payer: Self-pay

## 2020-06-29 ENCOUNTER — Encounter: Payer: Self-pay | Admitting: Nurse Practitioner

## 2020-06-29 ENCOUNTER — Ambulatory Visit (INDEPENDENT_AMBULATORY_CARE_PROVIDER_SITE_OTHER): Payer: 59 | Admitting: Nurse Practitioner

## 2020-06-29 VITALS — BP 117/78 | HR 88 | Temp 97.8°F | Ht 66.0 in | Wt 195.2 lb

## 2020-06-29 DIAGNOSIS — L409 Psoriasis, unspecified: Secondary | ICD-10-CM | POA: Diagnosis not present

## 2020-06-29 MED ORDER — HYDROCORTISONE 1 % EX LOTN: 1.0000 "application " | TOPICAL_LOTION | Freq: Two times a day (BID) | CUTANEOUS | 0 refills | Status: DC

## 2020-06-29 NOTE — Assessment & Plan Note (Signed)
Unresolved worsening psoriasis rash.  Referral to dermatology completed, continue 1% hydrocortisone cream.  Depo-Medrol shot given in clinic.

## 2020-06-29 NOTE — Progress Notes (Signed)
Established Patient Office Visit  Subjective:  Patient ID: Rebecca Moreno, female    DOB: August 17, 1965  Age: 55 y.o. MRN: [id number]  CC:  Chief Complaint  Patient presents with  . Follow-up    psoriasis    HPI [name] presents for Rash: Patient complains of rash involving the head, neck and palm of hand. Rash started 4 years ago. Appearance of rash at onset: Other appearance: red , scaly, raised, dry. Rash has not changed over time Initial distribution: generalized.  Discomfort associated with rash: is painful and is pruritic.  Associated symptoms: none. Denies: abdominal pain, arthralgia, congestion, fever and sore throat. Patient has had previous evaluation of rash. Patient has had previous treatment.  Response to treatment: Mild. Patient has not had contacts with similar rash. Patient has not identified precipitant. Patient has not had new exposures (soaps, lotions, laundry detergents, foods, medications, plants, insects or animals.)  Past Medical History:  Diagnosis Date  . Acute pyelonephritis   . Asthma   . Bipolar disorder ([place])   . Colon polyp   . Complication of anesthesia    "sometimes hard to put to sleep"  . Depression    Bipolar  . Gastric ulcer    around 2013.  stress related  . GERD (gastroesophageal reflux disease)   . History of kidney stones   . Hyperlipidemia   . Hypothyroid   . Polyp, uterus corpus   . Sepsis due to Escherichia coli (E. coli) ([place]) 09/04/2018  . Sleep apnea    no cpap used  . Stroke [facility])     Past Surgical History:  Procedure Laterality Date  . ABLATION ON ENDOMETRIOSIS    . COLONOSCOPY     Per patient, done around 2013 in [place], had polyp and overdue for follow up.  . COLONOSCOPY WITH PROPOFOL N/A 05/12/2018   Procedure: COLONOSCOPY WITH PROPOFOL;  Surgeon: Daneil Dolin, MD;  Location: [facility];  Service: Endoscopy;  Laterality: N/A;  12:00pm  . CYSTOSCOPY W/ URETERAL STENT PLACEMENT Left 09/01/2018   Procedure:  CYSTOSCOPY WITH RETROGRADE PYELOGRAM/URETERAL STENT PLACEMENT;  Surgeon: Ceasar Mons, MD;  Location: [facility];  Service: Urology;  Laterality: Left;  . CYSTOSCOPY/URETEROSCOPY/HOLMIUM LASER/STENT PLACEMENT Left 09/17/2018   Procedure: CYSTOSCOPY/URETEROSCOPY/HOLMIUM LASER/STENT PLACEMENT;  Surgeon: Ceasar Mons, MD;  Location: [facility];  Service: Urology;  Laterality: Left;  ONLY NEEDS 45 MIN  . OVARIAN CYST SURGERY Left   . POLYPECTOMY  05/12/2018   Procedure: POLYPECTOMY;  Surgeon: Daneil Dolin, MD;  Location: [facility];  Service: Endoscopy;;  polyp at splenic flexure  . TEMPOROMANDIBULAR JOINT SURGERY     9 surgeries  . TUBAL LIGATION    . vaginal childbirth     1    Family History  Problem Relation Age of Onset  . Anxiety disorder Mother   . Hypertension Mother   . Heart failure Mother   . Stroke Mother   . Hyperlipidemia Father   . Diabetes Father   . Stroke Father   . Hypertension Father   . Colon polyps Father        older than 42  . Diabetes Sister   . Hypertension Sister   . Ovarian cancer Sister   . Asthma Daughter   . Ovarian cancer Maternal Grandmother   . Colon cancer Maternal Grandmother   . Renal Disease Paternal Grandmother   . Heart attack Paternal Grandfather   . Bipolar disorder Other     Social History  Socioeconomic History  . Marital status: Married    Spouse name: Not on file  . Number of children: 1  . Years of education: Not on file  . Highest education level: High school graduate  Occupational History  . Occupation: disability  Tobacco Use  . Smoking status: Former Smoker    Packs/day: 0.50    Years: 30.00    Pack years: 15.00    Types: Cigarettes    Quit date: 09/06/2019    Years since quitting: 0.8  . Smokeless tobacco: Never Used  Vaping Use  . Vaping Use: Never used  Substance and Sexual Activity  . Alcohol use: Never  . Drug use: Not Currently  . Sexual activity: Not Currently    Birth  control/protection: Surgical    Comment: tubal  Other Topics Concern  . Not on file  Social History Narrative  . Not on file   Social Determinants of Health   Financial Resource Strain: Low Risk   . Difficulty of Paying Living Expenses: Not very hard  Food Insecurity: Unknown  . Worried About Charity fundraiser in the Last Year: Never true  . Ran Out of Food in the Last Year: Not on file  Transportation Needs: No Transportation Needs  . Lack of Transportation (Medical): No  . Lack of Transportation (Non-Medical): No  Physical Activity: Sufficiently Active  . Days of Exercise per Week: 5 days  . Minutes of Exercise per Session: 90 min  Stress: Not on file  Social Connections: Socially Integrated  . Frequency of Communication with Friends and Family: Three times a week  . Frequency of Social Gatherings with Friends and Family: More than three times a week  . Attends Religious Services: 1 to 4 times per year  . Active Member of Clubs or Organizations: Not on file  . Attends Archivist Meetings: More than 4 times per year  . Marital Status: Married  Human resources officer Violence: Not At Risk  . Fear of Current or Ex-Partner: No  . Emotionally Abused: No  . Physically Abused: No  . Sexually Abused: No    Outpatient Medications Prior to Visit  Medication Sig Dispense Refill  . Acetaminophen-Codeine 300-30 MG tablet acetaminophen 300 mg-codeine 30 mg tablet  TAKE 1 TABLET BY MOUTH EVERY 4 HOURS AS NEEDED FOR PAIN    . albuterol (PROVENTIL HFA;VENTOLIN HFA) 108 (90 Base) MCG/ACT inhaler Inhale 1 puff into the lungs every 6 (six) hours as needed for wheezing or shortness of breath.    Marland Kitchen aspirin 325 MG tablet Take 1 tablet by mouth daily.    Marland Kitchen atorvastatin (LIPITOR) 80 MG tablet Take 1 tablet (80 mg total) by mouth at bedtime. (Needs to be seen before next refill) 30 tablet 0  . CALCIUM CITRATE-VITAMIN D3 PO Take 1 tablet by mouth daily.    . Cariprazine HCl (VRAYLAR) 6 MG  CAPS Take 1 capsule (6 mg total) by mouth daily. 90 capsule 0  . Cholecalciferol (VITAMIN D3) 5000 units TABS Take 5,000 Units by mouth daily.     . clonazePAM (KLONOPIN) 1 MG tablet Take 1 tablet (1 mg total) by mouth 2 (two) times daily as needed for anxiety. 60 tablet 2  . Coal Tar Extract 3 % SHAM Apply 1 application topically every morning. 240 mL 1  . DEXILANT 30 MG capsule TAKE (1) CAPSULE DAILY 30 capsule 0  . famotidine (PEPCID) 20 MG tablet TAKE 1 TABLET DAILY 90 tablet 3  . fluticasone (FLONASE) 50  MCG/ACT nasal spray SPRAY 2 SPRAYS INTO EACH NOSTRIL EVERY DAY 48 mL 0  . fluticasone (FLOVENT HFA) 220 MCG/ACT inhaler Inhale 1 puff into the lungs daily.     Marland Kitchen lamoTRIgine (LAMICTAL) 100 MG tablet Take 3 tablets (300 mg total) by mouth at bedtime. 270 tablet 0  . levothyroxine (SYNTHROID) 50 MCG tablet Take 1 tablet (50 mcg total) by mouth daily. 30 tablet 11  . loratadine (CLARITIN) 10 MG tablet TAKE 1 TABLET BY MOUTH EVERY DAY 30 tablet 5  . magnesium gluconate (MAGONATE) 500 MG tablet Take 500 mg by mouth daily.    . mineral oil-hydrophilic petrolatum (AQUAPHOR) ointment Apply topically as needed for dry skin. 420 g 0  . Multiple Vitamins-Minerals (MULTIVITAMIN WOMEN PO) Take 1 tablet by mouth daily.    . QUEtiapine (SEROQUEL) 300 MG tablet Take 1 tablet (300 mg total) by mouth at bedtime. 90 tablet 0  . varenicline (CHANTIX CONTINUING MONTH PAK) 1 MG tablet Take 1 tablet (1 mg total) by mouth 2 (two) times daily. 180 tablet 1  . venlafaxine XR (EFFEXOR-XR) 150 MG 24 hr capsule Take 2 capsules (300 mg total) by mouth daily. 180 capsule 0  . hydrocortisone 1 % lotion Apply 1 application topically 2 (two) times daily. 118 mL 0   No facility-administered medications prior to visit.    Allergies  Allergen Reactions  . Lithium Other (See Comments)    Psoriasis     ROS Review of Systems  Constitutional: Negative.   HENT: Negative.   Eyes: Negative.   Respiratory: Negative.    Cardiovascular: Negative.   Genitourinary: Negative.   Skin: Positive for rash.  All other systems reviewed and are negative.     Objective:    Physical Exam Vitals reviewed.  Constitutional:      Appearance: Normal appearance.  HENT:     Head: Normocephalic.     Nose: Nose normal.  Eyes:     Conjunctiva/sclera: Conjunctivae normal.  Cardiovascular:     Rate and Rhythm: Normal rate and regular rhythm.     Pulses: Normal pulses.     Heart sounds: Normal heart sounds.  Pulmonary:     Effort: Pulmonary effort is normal.     Breath sounds: Normal breath sounds.  Abdominal:     General: Bowel sounds are normal.  Musculoskeletal:        General: Normal range of motion.  Skin:    Findings: Rash present.  Neurological:     Mental Status: She is alert and oriented to person, place, and time.  Psychiatric:     Comments: Anxiety and depression     BP 117/78   Pulse 88   Temp 97.8 F (36.6 C)   Ht 5\' 6"  (1.676 m)   Wt 195 lb 3.2 oz (88.5 kg)   LMP 12/13/2017 (Approximate)   SpO2 94%   BMI 31.51 kg/m  Wt Readings from Last 3 Encounters:  06/29/20 195 lb 3.2 oz (88.5 kg)  06/01/20 195 lb 12.8 oz (88.8 kg)  01/14/20 201 lb 12.8 oz (91.5 kg)     There are no preventive care reminders to display for this patient.  There are no preventive care reminders to display for this patient.  Lab Results  Component Value Date   TSH 0.718 12/24/2019   Lab Results  Component Value Date   WBC 12.2 (H) 01/14/2020   HGB 15.7 (H) 01/14/2020   HCT 47.7 (H) 01/14/2020   MCV 104.6 (H) 01/14/2020   PLT  238 01/14/2020   Lab Results  Component Value Date   NA 142 12/24/2019   K 4.4 12/24/2019   CO2 30 (H) 12/24/2019   GLUCOSE 102 (H) 12/24/2019   BUN 13 12/24/2019   CREATININE 0.82 12/24/2019   BILITOT 0.2 12/24/2019   ALKPHOS 132 (H) 12/24/2019   AST 15 12/24/2019   ALT 23 12/24/2019   PROT 5.9 (L) 12/24/2019   ALBUMIN 4.3 12/24/2019   CALCIUM 9.2 12/24/2019    ANIONGAP 8 01/05/2019   Lab Results  Component Value Date   CHOL 212 (H) 12/18/2018   Lab Results  Component Value Date   HDL 44 12/18/2018   Lab Results  Component Value Date   LDLCALC 134 (H) 12/18/2018   Lab Results  Component Value Date   TRIG 171 (H) 12/18/2018   Lab Results  Component Value Date   CHOLHDL 4.8 (H) 12/18/2018   No results found for: HGBA1C    Assessment & Plan:   Problem List Items Addressed This Visit      Musculoskeletal and Integument   Psoriasis - Primary    Unresolved worsening psoriasis rash.  Referral to dermatology completed, continue 1% hydrocortisone cream.  Depo-Medrol shot given in clinic.         Relevant Medications   hydrocortisone 1 % lotion   Other Relevant Orders   Ambulatory referral to Dermatology      Meds ordered this encounter  Medications  . hydrocortisone 1 % lotion    Sig: Apply 1 application topically 2 (two) times daily.    Dispense:  118 mL    Refill:  0    Order Specific Question:   Supervising Provider    Answer:   [name] [[id number]]    Follow-up: Return if symptoms worsen or fail to improve.    Ivy Lynn, NP

## 2020-06-29 NOTE — Patient Instructions (Signed)
Dermatology referral completed  Psoriasis Psoriasis is a long-term (chronic) skin condition. It occurs because your body's defense system (immune system) causes skin cells to form too quickly. This causes raised, red patches (plaques) on your skin that look silvery. The patches may be on all areas of your body. They can be any size or shape. Psoriasis can come and go. It can range from mild to very bad. It cannot be passed from one person to another (is not contagious). There is no cure for this condition, but it can be helped with treatment. What are the causes? The cause of psoriasis is not known. Some things can make it worse. These are:  Skin damage, such as cuts, scrapes, sunburn, and dryness.  Not getting enough sunlight.  Some medicines.  Alcohol.  Tobacco.  Stress.  Infections. What increases the risk?  Having a family member with psoriasis.  Being very overweight (obese).  Being 25-81 years old.  Taking certain medicines. What are the signs or symptoms? There are different types of psoriasis. The types are:  Plaque. This is the most common. Symptoms include red, raised patches with a silvery coating. These may be itchy. Your nails may be crumbly or fall off.  Guttate. Symptoms include small red spots on your stomach area, arms, and legs. These may happen after you have been sick, such as with strep throat.  Inverse. Symptoms include patches in your armpits, under your breasts, private areas, or on your butt.  Pustular. Symptoms include pus-filled bumps on the palms of your hands or the soles of your feet. You also may feel very tired, weak, have a fever, and not be hungry.  Erythrodermic. Symptoms include bright red skin that looks burned. You may have a fast heartbeat and a body temperature that is too high or too low. You may be itchy or in pain.  Sebopsoriasis. Symptoms include red patches on your scalp, forehead, and face that are greasy.  Psoriatic  arthritis. Symptoms include swollen, painful joints along with scaly skin patches.   How is this treated? There is no cure for this condition, but treatment can:  Help your skin heal.  Lessen itching and irritation and swelling (inflammation).  Slow the growth of new skin cells.  Help your body's defense system respond better to your skin. Treatment may include:  Creams or ointments.  Light therapy. This may include natural sunlight or light therapy in a doctor's office.  Medicines. These can help your body better manage skin cells. They may be used with light therapy or ointments. Medicines may include pills or injections. You may also get antibiotic medicines if you have an infection. Follow these instructions at home: Skin Care  Apply lotion to your skin as needed. Only use those that your doctor has said are okay.  Apply cool, wet cloths (cold compresses) to the affected areas.  Do not use a hot tub or take hot showers. Use slightly warm, not hot, water when taking showers and baths.  Do not scratch your skin. Lifestyle  Do not use any products that contain nicotine or tobacco, such as cigarettes, e-cigarettes, and chewing tobacco. If you need help quitting, ask your doctor.  Lower your stress.  Keep a healthy weight.  Go out in the sun as told by your doctor. Do not get sunburned.  Join a support group.   Medicines  Take or use over-the-counter and prescription medicines only as told by your doctor.  If you were prescribed an antibiotic medicine,  take it as told by your doctor. Do not stop using the antibiotic even if you start to feel better. Alcohol use If you drink alcohol:  Limit how much you use: ? 0-1 drink a day for women. ? 0-2 drinks a day for men.  Be aware of how much alcohol is in your drink. In the [place] one drink equals one 12 oz bottle of beer (355 mL), one 5 oz glass of wine (148 mL), or one 1 oz glass of hard liquor (44 mL). General  instructions  Keep a journal to track the things that cause symptoms (triggers). Try to avoid these things.  See a counselor if you feel the support would help.  Keep all follow-up visits as told by your doctor. This is important. Contact a doctor if:  You have a fever.  Your pain gets worse.  You have more redness or warmth in the affected areas.  You have new or worse pain or stiffness in your joints.  Your nails start to break easily or pull away from the nail bed.  You feel very sad (depressed). Summary  Psoriasis is a long-term (chronic) skin condition.  There is no cure for this condition, but treatment can help manage it.  Keep a journal to track the things that cause symptoms.  Take or use over-the-counter and prescription medicines only as told by your doctor.  Keep all follow-up visits as told by your doctor. This is important. This information is not intended to replace advice given to you by your health care provider. Make sure you discuss any questions you have with your health care provider. Document Revised: 02/25/2018 Document Reviewed: 02/25/2018 Elsevier Patient Education  2021 [employer].

## 2020-07-06 ENCOUNTER — Encounter: Payer: Self-pay | Admitting: Family Medicine

## 2020-07-06 ENCOUNTER — Ambulatory Visit (INDEPENDENT_AMBULATORY_CARE_PROVIDER_SITE_OTHER): Payer: 59 | Admitting: Family Medicine

## 2020-07-06 ENCOUNTER — Other Ambulatory Visit: Payer: Self-pay

## 2020-07-06 ENCOUNTER — Other Ambulatory Visit: Payer: Self-pay | Admitting: Family Medicine

## 2020-07-06 ENCOUNTER — Telehealth: Payer: Self-pay

## 2020-07-06 VITALS — BP 111/71 | HR 84 | Temp 97.5°F | Ht 66.0 in | Wt 196.0 lb

## 2020-07-06 DIAGNOSIS — L409 Psoriasis, unspecified: Secondary | ICD-10-CM | POA: Diagnosis not present

## 2020-07-06 DIAGNOSIS — Z720 Tobacco use: Secondary | ICD-10-CM | POA: Diagnosis not present

## 2020-07-06 DIAGNOSIS — N1831 Chronic kidney disease, stage 3a: Secondary | ICD-10-CM

## 2020-07-06 DIAGNOSIS — J301 Allergic rhinitis due to pollen: Secondary | ICD-10-CM

## 2020-07-06 DIAGNOSIS — E039 Hypothyroidism, unspecified: Secondary | ICD-10-CM

## 2020-07-06 MED ORDER — CALCIPOTRIENE-BETAMETH DIPROP 0.005-0.064 % EX CREA: TOPICAL_CREAM | CUTANEOUS | 3 refills | Status: DC

## 2020-07-06 MED ORDER — BETAMETHASONE VALERATE 0.1 % EX OINT: 1.0000 "application " | TOPICAL_OINTMENT | Freq: Two times a day (BID) | CUTANEOUS | 2 refills | Status: DC

## 2020-07-06 NOTE — Telephone Encounter (Signed)
Patient seen today and cream prescribed.  Pharmacy calling to let you know cost.  Can something else be sent instead.

## 2020-07-06 NOTE — Progress Notes (Signed)
Subjective: CC: tobacco use d/o, hypothyroidism, CKD3 PCP: [name], DO PFX:TKWIO Rebecca Moreno is a 55 y.o. Moreno presenting to clinic today for:  1.  Hypothyroidism Patient is compliant with her medications.  No heart palpitations, tremor or change in bowel habit  2.  CKD 3, anemia Patient is followed by Dr. Delton Coombes.  Next appointment coming up soon.  No reports of chest pain, decreased urine output  3.  Psoriasis Patient has ongoing psoriatic changes in the left foot and along her chest.  Rebecca Moreno has been on a couple of different corticosteroids but is not somewhat Rebecca Moreno is been on anything moderate or high potency yet.  Rebecca Moreno has been referred to dermatologist but has yet to be scheduled.   ROS: Per HPI  Allergies  Allergen Reactions  . Lithium Other (See Comments)    Psoriasis    Past Medical History:  Diagnosis Date  . Acute pyelonephritis   . Asthma   . Bipolar disorder ([place])   . Colon polyp   . Complication of anesthesia    "sometimes hard to put to sleep"  . Depression    Bipolar  . Gastric ulcer    around 2013.  stress related  . GERD (gastroesophageal reflux disease)   . History of kidney stones   . Hyperlipidemia   . Hypothyroid   . Polyp, uterus corpus   . Sepsis due to Escherichia coli (E. coli) ([place]) 09/04/2018  . Sleep apnea    no cpap used  . Stroke [facility])     Current Outpatient Medications:  .  Acetaminophen-Codeine 300-30 MG tablet, acetaminophen 300 mg-codeine 30 mg tablet  TAKE 1 TABLET BY MOUTH EVERY 4 HOURS AS NEEDED FOR PAIN, Disp: , Rfl:  .  albuterol (PROVENTIL HFA;VENTOLIN HFA) 108 (90 Base) MCG/ACT inhaler, Inhale 1 puff into the lungs every 6 (six) hours as needed for wheezing or shortness of breath., Disp: , Rfl:  .  aspirin 325 MG tablet, Take 1 tablet by mouth daily., Disp: , Rfl:  .  atorvastatin (LIPITOR) 80 MG tablet, Take 1 tablet (80 mg total) by mouth at bedtime. (Needs to be seen before next refill), Disp: 30 tablet, Rfl:  0 .  CALCIUM CITRATE-VITAMIN D3 PO, Take 1 tablet by mouth daily., Disp: , Rfl:  .  Cariprazine HCl (VRAYLAR) 6 MG CAPS, Take 1 capsule (6 mg total) by mouth daily., Disp: 90 capsule, Rfl: 0 .  Cholecalciferol (VITAMIN D3) 5000 units TABS, Take 5,000 Units by mouth daily. , Disp: , Rfl:  .  clonazePAM (KLONOPIN) 1 MG tablet, Take 1 tablet (1 mg total) by mouth 2 (two) times daily as needed for anxiety., Disp: 60 tablet, Rfl: 2 .  Coal Tar Extract 3 % SHAM, Apply 1 application topically every morning., Disp: 240 mL, Rfl: 1 .  DEXILANT 30 MG capsule, TAKE (1) CAPSULE DAILY, Disp: 30 capsule, Rfl: 0 .  famotidine (PEPCID) 20 MG tablet, TAKE 1 TABLET DAILY, Disp: 90 tablet, Rfl: 3 .  fluticasone (FLONASE) 50 MCG/ACT nasal spray, SPRAY 2 SPRAYS INTO EACH NOSTRIL EVERY DAY, Disp: 48 mL, Rfl: 0 .  fluticasone (FLOVENT HFA) 220 MCG/ACT inhaler, Inhale 1 puff into the lungs daily. , Disp: , Rfl:  .  hydrocortisone 1 % lotion, Apply 1 application topically 2 (two) times daily., Disp: 118 mL, Rfl: 0 .  lamoTRIgine (LAMICTAL) 100 MG tablet, Take 3 tablets (300 mg total) by mouth at bedtime., Disp: 270 tablet, Rfl: 0 .  levothyroxine (SYNTHROID) 50  MCG tablet, Take 1 tablet (50 mcg total) by mouth daily., Disp: 30 tablet, Rfl: 11 .  loratadine (CLARITIN) 10 MG tablet, TAKE 1 TABLET BY MOUTH EVERY DAY, Disp: 30 tablet, Rfl: 5 .  magnesium gluconate (MAGONATE) 500 MG tablet, Take 500 mg by mouth daily., Disp: , Rfl:  .  mineral oil-hydrophilic petrolatum (AQUAPHOR) ointment, Apply topically as needed for dry skin., Disp: 420 g, Rfl: 0 .  Multiple Vitamins-Minerals (MULTIVITAMIN WOMEN PO), Take 1 tablet by mouth daily., Disp: , Rfl:  .  QUEtiapine (SEROQUEL) 300 MG tablet, Take 1 tablet (300 mg total) by mouth at bedtime., Disp: 90 tablet, Rfl: 0 .  varenicline (CHANTIX CONTINUING MONTH PAK) 1 MG tablet, Take 1 tablet (1 mg total) by mouth 2 (two) times daily., Disp: 180 tablet, Rfl: 1 .  venlafaxine XR  (EFFEXOR-XR) 150 MG 24 hr capsule, Take 2 capsules (300 mg total) by mouth daily., Disp: 180 capsule, Rfl: 0 Social History   Socioeconomic History  . Marital status: Married    Spouse name: Not on file  . Number of children: 1  . Years of education: Not on file  . Highest education level: High school graduate  Occupational History  . Occupation: disability  Tobacco Use  . Smoking status: Former Smoker    Packs/day: 0.50    Years: 30.00    Pack years: 15.00    Types: Cigarettes    Quit date: 09/06/2019    Years since quitting: 0.8  . Smokeless tobacco: Never Used  Vaping Use  . Vaping Use: Never used  Substance and Sexual Activity  . Alcohol use: Never  . Drug use: Not Currently  . Sexual activity: Not Currently    Birth control/protection: Surgical    Comment: tubal  Other Topics Concern  . Not on file  Social History Narrative  . Not on file   Social Determinants of Health   Financial Resource Strain: Low Risk   . Difficulty of Paying Living Expenses: Not very hard  Food Insecurity: Unknown  . Worried About Charity fundraiser in the Last Year: Never true  . Ran Out of Food in the Last Year: Not on file  Transportation Needs: No Transportation Needs  . Lack of Transportation (Medical): No  . Lack of Transportation (Non-Medical): No  Physical Activity: Sufficiently Active  . Days of Exercise per Week: 5 days  . Minutes of Exercise per Session: 90 min  Stress: Not on file  Social Connections: Socially Integrated  . Frequency of Communication with Friends and Family: Three times a week  . Frequency of Social Gatherings with Friends and Family: More than three times a week  . Attends Religious Services: 1 to 4 times per year  . Active Member of Clubs or Organizations: Not on file  . Attends Archivist Meetings: More than 4 times per year  . Marital Status: Married  Human resources officer Violence: Not At Risk  . Fear of Current or Ex-Partner: No  . Emotionally  Abused: No  . Physically Abused: No  . Sexually Abused: No   Family History  Problem Relation Age of Onset  . Anxiety disorder Mother   . Hypertension Mother   . Heart failure Mother   . Stroke Mother   . Hyperlipidemia Father   . Diabetes Father   . Stroke Father   . Hypertension Father   . Colon polyps Father        older than 18  . Diabetes Sister   .  Hypertension Sister   . Ovarian cancer Sister   . Asthma Daughter   . Ovarian cancer Maternal Grandmother   . Colon cancer Maternal Grandmother   . Renal Disease Paternal Grandmother   . Heart attack Paternal Grandfather   . Bipolar disorder Other     Objective: Office vital signs reviewed. BP 111/71   Pulse 84   Temp (!) 97.5 F (36.4 C) (Temporal)   Ht 5\' 6"  (1.676 m)   Wt 196 lb (88.9 kg)   LMP 12/13/2017 (Approximate)   SpO2 95%   BMI 31.64 kg/m   Physical Examination:  General: Awake, alert, well nourished, No acute distress HEENT: Normal; no exophthalmos Cardio: regular rate and rhythm, S1S2 heard, no murmurs appreciated Pulm: clear to auscultation bilaterally, no wheezes, rhonchi or rales; normal work of breathing on room air Skin: Silver scale patches noted along the medial and lateral aspects of the left foot with associated erythema.  No petechiae appreciated.  Rebecca Moreno has a few smaller lesions along the upper extremities and chest.  No vesicular formations appreciated  Assessment/ Plan: 55 y.o. Moreno   Tobacco use - Plan: CBC with Differential  Acquired hypothyroidism - Plan: Thyroid Panel With TSH  Stage 3a chronic kidney disease ([place]) - Plan: Renal Function Panel, CBC with Differential  Psoriasis - Plan: betamethasone valerate ointment (VALISONE) 0.1 %, DISCONTINUED: Calcipotriene-Betameth Diprop 0.005-0.064 % CREA  Check CBC.  Continue Chantix.  Advised to use for 1 month following last cigarette.  Rebecca Moreno is down to 3 cigarettes on very optimistic about her coming off of these totally soon  Rebecca Moreno is  asymptomatic from a thyroid standpoint.  Check thyroid panel  Check renal function panel, CBC given known CKD 3 a  Given her psoriasis, it does not sound like Rebecca Moreno is really been put on a moderate or high potency corticosteroid.  I initially considered doing Taclonex but unfortunately her insurance did not cover this well.  Instead I have simply placed her on the betamethasone portion.  I will reach out to [facility] to check on her dermatology referral.  We discussed the possible differential of a complication of the Lamictal.  I would like her to keep a very close eye on her lesions.  We may need to consider adjustment of her medication if these are not felt to be true psoriatic plaques.  No orders of the defined types were placed in this encounter.  No orders of the defined types were placed in this encounter.    [name], DO [place] [phone]

## 2020-07-06 NOTE — Patient Instructions (Signed)
New cream sent for psoriasis

## 2020-07-07 ENCOUNTER — Telehealth: Payer: Self-pay | Admitting: Family Medicine

## 2020-07-07 LAB — CBC WITH DIFFERENTIAL/PLATELET
Basophils Absolute: 0 10*3/uL (ref 0.0–0.2)
Basos: 0 %
EOS (ABSOLUTE): 0 10*3/uL (ref 0.0–0.4)
Eos: 0 %
Hematocrit: 46.9 % — ABNORMAL HIGH (ref 34.0–46.6)
Hemoglobin: 16.1 g/dL — ABNORMAL HIGH (ref 11.1–15.9)
Immature Grans (Abs): 0 10*3/uL (ref 0.0–0.1)
Immature Granulocytes: 0 %
Lymphocytes Absolute: 1.7 10*3/uL (ref 0.7–3.1)
Lymphs: 25 %
MCH: 34 pg — ABNORMAL HIGH (ref 26.6–33.0)
MCHC: 34.3 g/dL (ref 31.5–35.7)
MCV: 99 fL — ABNORMAL HIGH (ref 79–97)
Monocytes Absolute: 0.7 10*3/uL (ref 0.1–0.9)
Monocytes: 10 %
Neutrophils Absolute: 4.4 10*3/uL (ref 1.4–7.0)
Neutrophils: 65 %
Platelets: 197 10*3/uL (ref 150–450)
RBC: 4.74 x10E6/uL (ref 3.77–5.28)
RDW: 12.5 % (ref 11.7–15.4)
WBC: 6.9 10*3/uL (ref 3.4–10.8)

## 2020-07-07 LAB — RENAL FUNCTION PANEL
Albumin: 4.3 g/dL (ref 3.8–4.9)
BUN/Creatinine Ratio: 12 (ref 9–23)
BUN: 11 mg/dL (ref 6–24)
CO2: 29 mmol/L (ref 20–29)
Calcium: 9.5 mg/dL (ref 8.7–10.2)
Chloride: 100 mmol/L (ref 96–106)
Creatinine, Ser: 0.91 mg/dL (ref 0.57–1.00)
Glucose: 80 mg/dL (ref 65–99)
Phosphorus: 2.8 mg/dL — ABNORMAL LOW (ref 3.0–4.3)
Potassium: 4.5 mmol/L (ref 3.5–5.2)
Sodium: 142 mmol/L (ref 134–144)
eGFR: 75 mL/min/{1.73_m2} (ref >59)

## 2020-07-07 LAB — THYROID PANEL WITH TSH
Free Thyroxine Index: 1.6 (ref 1.2–4.9)
T3 Uptake Ratio: 26 % (ref 24–39)
T4, Total: 6 ug/dL (ref 4.5–12.0)
TSH: 0.912 u[IU]/mL (ref 0.450–4.500)

## 2020-07-07 NOTE — Telephone Encounter (Signed)
Pt calling about dermatology referral

## 2020-07-14 ENCOUNTER — Inpatient Hospital Stay (HOSPITAL_COMMUNITY): Payer: 59 | Attending: Hematology

## 2020-07-14 ENCOUNTER — Other Ambulatory Visit: Payer: Self-pay

## 2020-07-14 DIAGNOSIS — E785 Hyperlipidemia, unspecified: Secondary | ICD-10-CM | POA: Diagnosis not present

## 2020-07-14 DIAGNOSIS — E039 Hypothyroidism, unspecified: Secondary | ICD-10-CM | POA: Diagnosis not present

## 2020-07-14 DIAGNOSIS — R51 Headache with orthostatic component, not elsewhere classified: Secondary | ICD-10-CM | POA: Diagnosis not present

## 2020-07-14 DIAGNOSIS — G8190 Hemiplegia, unspecified affecting unspecified side: Secondary | ICD-10-CM | POA: Insufficient documentation

## 2020-07-14 DIAGNOSIS — Z87442 Personal history of urinary calculi: Secondary | ICD-10-CM | POA: Insufficient documentation

## 2020-07-14 DIAGNOSIS — Z79899 Other long term (current) drug therapy: Secondary | ICD-10-CM | POA: Diagnosis not present

## 2020-07-14 DIAGNOSIS — N1330 Unspecified hydronephrosis: Secondary | ICD-10-CM | POA: Insufficient documentation

## 2020-07-14 DIAGNOSIS — D751 Secondary polycythemia: Secondary | ICD-10-CM | POA: Diagnosis not present

## 2020-07-14 DIAGNOSIS — Z86010 Personal history of colonic polyps: Secondary | ICD-10-CM | POA: Insufficient documentation

## 2020-07-14 DIAGNOSIS — Z8673 Personal history of transient ischemic attack (TIA), and cerebral infarction without residual deficits: Secondary | ICD-10-CM | POA: Insufficient documentation

## 2020-07-14 DIAGNOSIS — G4730 Sleep apnea, unspecified: Secondary | ICD-10-CM | POA: Diagnosis not present

## 2020-07-14 DIAGNOSIS — K219 Gastro-esophageal reflux disease without esophagitis: Secondary | ICD-10-CM | POA: Diagnosis not present

## 2020-07-14 LAB — CBC WITH DIFFERENTIAL/PLATELET
Abs Immature Granulocytes: 0.06 10*3/uL (ref 0.00–0.07)
Basophils Absolute: 0.1 10*3/uL (ref 0.0–0.1)
Basophils Relative: 1 %
Eosinophils Absolute: 0 10*3/uL (ref 0.0–0.5)
Eosinophils Relative: 0 %
HCT: 50.6 % — ABNORMAL HIGH (ref 36.0–46.0)
Hemoglobin: 16.1 g/dL — ABNORMAL HIGH (ref 12.0–15.0)
Immature Granulocytes: 1 %
Lymphocytes Relative: 22 %
Lymphs Abs: 1.4 10*3/uL (ref 0.7–4.0)
MCH: 34.3 pg — ABNORMAL HIGH (ref 26.0–34.0)
MCHC: 31.8 g/dL (ref 30.0–36.0)
MCV: 107.7 fL — ABNORMAL HIGH (ref 80.0–100.0)
Monocytes Absolute: 0.6 10*3/uL (ref 0.1–1.0)
Monocytes Relative: 10 %
Neutro Abs: 4.3 10*3/uL (ref 1.7–7.7)
Neutrophils Relative %: 66 %
Platelets: 223 10*3/uL (ref 150–400)
RBC: 4.7 MIL/uL (ref 3.87–5.11)
RDW: 13.7 % (ref 11.5–15.5)
WBC: 6.5 10*3/uL (ref 4.0–10.5)
nRBC: 0 % (ref 0.0–0.2)

## 2020-07-18 ENCOUNTER — Encounter (HOSPITAL_COMMUNITY): Payer: Self-pay | Admitting: Hematology

## 2020-07-18 ENCOUNTER — Other Ambulatory Visit: Payer: Self-pay

## 2020-07-18 ENCOUNTER — Inpatient Hospital Stay (HOSPITAL_BASED_OUTPATIENT_CLINIC_OR_DEPARTMENT_OTHER): Payer: 59 | Admitting: Hematology

## 2020-07-18 VITALS — BP 131/83 | HR 89 | Temp 97.4°F | Resp 18 | Wt 196.9 lb

## 2020-07-18 DIAGNOSIS — D751 Secondary polycythemia: Secondary | ICD-10-CM | POA: Diagnosis not present

## 2020-07-18 NOTE — Progress Notes (Signed)
[place], [place] [address]   CLINIC:  Medical Oncology/Hematology  PCP:  [name], DO [address] [place] [address]  [phone]  REASON FOR VISIT:  Follow-up for erythcytosis  PRIOR THERAPY: None  CURRENT THERAPY: Intermittent phlebotomy on 02/05/2019  INTERVAL HISTORY:  Rebecca Moreno, a 55 y.o. female, returns for routine follow-up for her erythrocytosis. Rebecca Moreno was last seen on 01/14/2020.  Today she reports feeling fair. She notes having occasional headaches but denies changes in vision or blurry vision. She also notices feeling more tired when her H/H increase and felt better after her last phlebotomy on 02/05/2019. She continues using Flonase daily. She is getting her mammograms through [facility] and she says that her last one was negative. She stopped using CPAP at night and is only using oxygen 2 L via [place]. She takes ASA 81 mg since she had her CVA in 09/2019. She also developed psoriasis all over her body and her fingers stay red constantly; she applies betamethasone on her hands whenever there are flare-ups.   REVIEW OF SYSTEMS:  Review of Systems  Constitutional: Positive for appetite change (50%) and fatigue (depleted).  Eyes: Negative for eye problems.  Neurological: Positive for headaches.  All other systems reviewed and are negative.   PAST MEDICAL/SURGICAL HISTORY:  Past Medical History:  Diagnosis Date  . Acute pyelonephritis   . Asthma   . Bipolar disorder ([place])   . Colon polyp   . Complication of anesthesia    "sometimes hard to put to sleep"  . Depression    Bipolar  . Gastric ulcer    around 2013.  stress related  . GERD (gastroesophageal reflux disease)   . History of kidney stones   . Hyperlipidemia   . Hypothyroid   . Polyp, uterus corpus   . Sepsis due to Escherichia coli (E. coli) ([place]) 09/04/2018  . Sleep apnea    no cpap used  . Stroke [facility])    Past Surgical History:  Procedure  Laterality Date  . ABLATION ON ENDOMETRIOSIS    . COLONOSCOPY     Per patient, done around 2013 in [place], had polyp and overdue for follow up.  . COLONOSCOPY WITH PROPOFOL N/A 05/12/2018   Procedure: COLONOSCOPY WITH PROPOFOL;  Surgeon: Daneil Dolin, MD;  Location: [facility] ENDO SUITE;  Service: Endoscopy;  Laterality: N/A;  12:00pm  . CYSTOSCOPY W/ URETERAL STENT PLACEMENT Left 09/01/2018   Procedure: CYSTOSCOPY WITH RETROGRADE PYELOGRAM/URETERAL STENT PLACEMENT;  Surgeon: Ceasar Mons, MD;  Location: [facility] ORS;  Service: Urology;  Laterality: Left;  . CYSTOSCOPY/URETEROSCOPY/HOLMIUM LASER/STENT PLACEMENT Left 09/17/2018   Procedure: CYSTOSCOPY/URETEROSCOPY/HOLMIUM LASER/STENT PLACEMENT;  Surgeon: Ceasar Mons, MD;  Location: [facility] ORS;  Service: Urology;  Laterality: Left;  ONLY NEEDS 45 MIN  . OVARIAN CYST SURGERY Left   . POLYPECTOMY  05/12/2018   Procedure: POLYPECTOMY;  Surgeon: Daneil Dolin, MD;  Location: [facility] SU[facility];  Service: Endoscopy;;  polyp at splenic flexure  . TEMPOROMANDIBULAR JOINT SURGERY     9 surgeries  . TUBAL LIGATION    . vaginal childbirth     1    SOCIAL HISTORY:  Social History   Socioeconomic History  . Marital status: Married    Spouse name: Not on file  . Number of children: 1  . Years of education: Not on file  . Highest education level: High school graduate  Occupational History  . Occupation: disability  Tobacco Use  . Smoking status: Former Smoker    Packs/day: 0.50    Years: 30.00    Pack years: 15.00    Types: Cigarettes    Quit date: 09/06/2019    Years since quitting: 0.8  . Smokeless tobacco: Never Used  Vaping Use  . Vaping Use: Never used  Substance and Sexual Activity  . Alcohol use: Never  . Drug use: Not Currently  . Sexual activity: Not Currently    Birth control/protection: Surgical    Comment: tubal  Other Topics Concern  . Not on file  Social History Narrative  . Not on file   Social Determinants  of Health   Financial Resource Strain: Low Risk   . Difficulty of Paying Living Expenses: Not very hard  Food Insecurity: Unknown  . Worried About Charity fundraiser in the Last Year: Never true  . Ran Out of Food in the Last Year: Not on file  Transportation Needs: No Transportation Needs  . Lack of Transportation (Medical): No  . Lack of Transportation (Non-Medical): No  Physical Activity: Sufficiently Active  . Days of Exercise per Week: 5 days  . Minutes of Exercise per Session: 90 min  Stress: Not on file  Social Connections: Socially Integrated  . Frequency of Communication with Friends and Family: Three times a week  . Frequency of Social Gatherings with Friends and Family: More than three times a week  . Attends Religious Services: 1 to 4 times per year  . Active Member of Clubs or Organizations: Not on file  . Attends Archivist Meetings: More than 4 times per year  . Marital Status: Married  Human resources officer Violence: Not At Risk  . Fear of Current or Ex-Partner: No  . Emotionally Abused: No  . Physically Abused: No  . Sexually Abused: No    FAMILY HISTORY:  Family History  Problem Relation Age of Onset  . Anxiety disorder Mother   . Hypertension Mother   . Heart failure Mother   . Stroke Mother   . Hyperlipidemia Father   . Diabetes Father   . Stroke Father   . Hypertension Father   . Colon polyps Father        older than 60  . Diabetes Sister   . Hypertension Sister   . Ovarian cancer Sister   . Asthma Daughter   . Ovarian cancer Maternal Grandmother   . Colon cancer Maternal Grandmother   . Renal Disease Paternal Grandmother   . Heart attack Paternal Grandfather   . Bipolar disorder Other     CURRENT MEDICATIONS:  Current Outpatient Medications  Medication Sig Dispense Refill  . Acetaminophen-Codeine 300-30 MG tablet acetaminophen 300 mg-codeine 30 mg tablet  TAKE 1 TABLET BY MOUTH EVERY 4 HOURS AS NEEDED FOR PAIN    . albuterol  (PROVENTIL HFA;VENTOLIN HFA) 108 (90 Base) MCG/ACT inhaler Inhale 1 puff into the lungs every 6 (six) hours as needed for wheezing or shortness of breath.    Marland Kitchen aspirin 325 MG tablet Take 1 tablet by mouth daily.    Marland Kitchen atorvastatin (LIPITOR) 80 MG tablet Take 1 tablet (80 mg total) by mouth at bedtime. (Needs to be seen before next refill) 30 tablet 0  . betamethasone valerate ointment (VALISONE) 0.1 % Apply 1 application topically 2 (two) times daily. Until the plaque resolves.  Then use as needed for each flareup. DO NOT APPLY TO FACE/GROIN/AXILLA 45 g 2  . CALCIUM CITRATE-VITAMIN D3 PO Take 1  tablet by mouth daily.    . Cariprazine HCl (VRAYLAR) 6 MG CAPS Take 1 capsule (6 mg total) by mouth daily. 90 capsule 0  . Cholecalciferol (VITAMIN D3) 5000 units TABS Take 5,000 Units by mouth daily.     . clonazePAM (KLONOPIN) 1 MG tablet Take 1 tablet (1 mg total) by mouth 2 (two) times daily as needed for anxiety. 60 tablet 2  . Coal Tar Extract 3 % SHAM Apply 1 application topically every morning. 240 mL 1  . DEXILANT 30 MG capsule TAKE (1) CAPSULE DAILY 30 capsule 0  . famotidine (PEPCID) 20 MG tablet TAKE 1 TABLET DAILY 90 tablet 3  . fluticasone (FLONASE) 50 MCG/ACT nasal spray SPRAY 2 SPRAYS INTO EACH NOSTRIL EVERY DAY 48 mL 0  . fluticasone (FLOVENT HFA) 220 MCG/ACT inhaler Inhale 1 puff into the lungs daily.     Marland Kitchen gabapentin (NEURONTIN) 300 MG capsule Take 600 mg by mouth at bedtime.    . hydrocortisone 1 % lotion Apply 1 application topically 2 (two) times daily. 118 mL 0  . lamoTRIgine (LAMICTAL) 100 MG tablet Take 3 tablets (300 mg total) by mouth at bedtime. 270 tablet 0  . levothyroxine (SYNTHROID) 50 MCG tablet Take 1 tablet (50 mcg total) by mouth daily. 30 tablet 11  . loratadine (CLARITIN) 10 MG tablet TAKE 1 TABLET DAILY 30 tablet 11  . magnesium gluconate (MAGONATE) 500 MG tablet Take 500 mg by mouth daily.    . mineral oil-hydrophilic petrolatum (AQUAPHOR) ointment Apply topically as  needed for dry skin. 420 g 0  . Multiple Vitamins-Minerals (MULTIVITAMIN WOMEN PO) Take 1 tablet by mouth daily.    . QUEtiapine (SEROQUEL) 300 MG tablet Take 1 tablet (300 mg total) by mouth at bedtime. 90 tablet 0  . varenicline (CHANTIX CONTINUING MONTH PAK) 1 MG tablet Take 1 tablet (1 mg total) by mouth 2 (two) times daily. 180 tablet 1  . venlafaxine XR (EFFEXOR-XR) 150 MG 24 hr capsule Take 2 capsules (300 mg total) by mouth daily. 180 capsule 0   No current facility-administered medications for this visit.    ALLERGIES:  Allergies  Allergen Reactions  . Lithium Other (See Comments)    Psoriasis     PHYSICAL EXAM:  Performance status (ECOG): 1 - Symptomatic but completely ambulatory  Vitals:   07/18/20 1440  BP: 131/83  Pulse: 89  Resp: 18  Temp: (!) 97.4 F (36.3 C)  SpO2: 96%   Wt Readings from Last 3 Encounters:  07/18/20 196 lb 14.4 oz (89.3 kg)  07/06/20 196 lb (88.9 kg)  06/29/20 195 lb 3.2 oz (88.5 kg)   Physical Exam Vitals reviewed.  Constitutional:      Appearance: Normal appearance. She is obese.  Cardiovascular:     Rate and Rhythm: Normal rate and regular rhythm.     Pulses: Normal pulses.     Heart sounds: Normal heart sounds.  Pulmonary:     Effort: Pulmonary effort is normal.     Breath sounds: Normal breath sounds.  Abdominal:     Palpations: Abdomen is soft. There is no hepatomegaly, splenomegaly or mass.     Tenderness: There is no abdominal tenderness.     Hernia: No hernia is present.  Musculoskeletal:     Right lower leg: No edema.     Left lower leg: No edema.  Skin:    Findings: Erythema (palmar surfaces of fingers bilaterally) present.  Neurological:     General: No focal deficit present.  Mental Status: She is alert and oriented to person, place, and time.  Psychiatric:        Mood and Affect: Mood normal.        Behavior: Behavior normal.     LABORATORY DATA:  I have reviewed the labs as listed.  CBC Latest Ref Rng &  Units 07/14/2020 07/06/2020 01/14/2020  WBC 4.0 - 10.5 K/uL 6.5 6.9 12.2(H)  Hemoglobin 12.0 - 15.0 g/dL 16.1(H) 16.1(H) 15.7(H)  Hematocrit 36.0 - 46.0 % 50.6(H) 46.9(H) 47.7(H)  Platelets 150 - 400 K/uL 223 197 238   CMP Latest Ref Rng & Units 07/06/2020 12/24/2019 08/19/2019  Glucose 65 - 99 mg/dL 80 102(H) 77  BUN 6 - 24 mg/dL 11 13 23   Creatinine 0.57 - 1.00 mg/dL 0.91 0.82 0.84  Sodium 134 - 144 mmol/L 142 142 142  Potassium 3.5 - 5.2 mmol/L 4.5 4.4 4.6  Chloride 96 - 106 mmol/L 100 102 100  CO2 20 - 29 mmol/L 29 30(H) 23  Calcium 8.7 - 10.2 mg/dL 9.5 9.2 9.4  Total Protein 6.0 - 8.5 g/dL - 5.9(L) -  Total Bilirubin 0.0 - 1.2 mg/dL - 0.2 -  Alkaline Phos 48 - 121 IU/L - 132(H) -  AST 0 - 40 IU/L - 15 -  ALT 0 - 32 IU/L - 23 -      Component Value Date/Time   RBC 4.70 07/14/2020 1043   MCV 107.7 (H) 07/14/2020 1043   MCV 99 (H) 07/06/2020 1447   MCH 34.3 (H) 07/14/2020 1043   MCHC 31.8 07/14/2020 1043   RDW 13.7 07/14/2020 1043   RDW 12.5 07/06/2020 1447   LYMPHSABS 1.4 07/14/2020 1043   LYMPHSABS 1.7 07/06/2020 1447   MONOABS 0.6 07/14/2020 1043   EOSABS 0.0 07/14/2020 1043   EOSABS 0.0 07/06/2020 1447   BASOSABS 0.1 07/14/2020 1043   BASOSABS 0.0 07/06/2020 1447    DIAGNOSTIC IMAGING:  I have independently reviewed the scans and discussed with the patient. No results found.   ASSESSMENT:  1. 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 JAK2 V617F and reflex mutation testing was negative. -Erythropoietin was 15.9. White count and platelet count was normal. -History of chronic headaches. Smoked half pack per day for 40 years. She quit smoking when she had stroke in May of this year. -CT renal study on 09/01/2018 showed mild left hydroureteronephrosis due to 4 mm mid left ureteral calculus. Tiny left renal calculus. Severe hepatic steatosis. -Phlebotomy on 02/05/2019. -She also has sleep apnea and has  not been using CPAP machine consistently.  2. Carboxyhemoglobinemia: -Carboxyhemoglobin was elevated at 18.4. ABG showed PO2 of 51 and PCO2 of 52. Elevated carboxyhemoglobin was out of proportion to her smoking.  3. 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 Right hemiparesis improved as did her speech. She continues to have some word finding difficulties. -MR angiogram of the head and neck region was done on 10/12/2019. She is on aspirin 325 mg daily.   PLAN:  1. 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  She has noticed improvement in energy levels after phlebotomy in the past. -Review of labs showed hemoglobin increased to 16.1 and hematocrit at 50.6. -I recommend phlebotomy every 6 weeks x 2. -She reportedly had new onset psoriasis in the last 1 month and is using  betamethasone cream and waiting for dermatology referral. -RTC 6 months for follow-up with repeat labs.  2.Right hemiparesis and aphasia: -This is completely improved at this time.  3. Abnormal mammogram: -She apparently had her mammogram in March of this year at [facility] and was reportedly normal.  Orders placed this encounter:  Orders Placed This Encounter  Procedures  . CBC with Differential/Platelet  . Comprehensive metabolic panel     Derek Jack, MD [place] [phone]   I, Milinda Antis, am acting as a scribe for Dr. Sanda Linger.  I, Derek Jack MD, have reviewed the above documentation for accuracy and completeness, and I agree with the above.

## 2020-07-18 NOTE — Patient Instructions (Signed)
[place] at [facility] Discharge Instructions  You were seen today by Dr. Delton Coombes. He went over your recent results. You will be scheduled to have 2 phlebotomies 6 weeks apart. Dr. Delton Coombes will see you back in 6 months for labs and follow up.   Thank you for choosing [facility] at [facility] to provide your oncology and hematology care.  To afford each patient quality time with our provider, please arrive at least 15 minutes before your scheduled appointment time.   If you have a lab appointment with the [facility] please come in thru the Main Entrance and check in at the main information desk  You need to re-schedule your appointment should you arrive 10 or more minutes late.  We strive to give you quality time with our providers, and arriving late affects you and other patients whose appointments are after yours.  Also, if you no show three or more times for appointments you may be dismissed from the clinic at the providers discretion.     Again, thank you for choosing [facility].  Our hope is that these requests will decrease the amount of time that you wait before being seen by our physicians.       _____________________________________________________________  Should you have questions after your visit to [facility], please contact our office at [phone] between the hours of 8:00 a.m. and 4:30 p.m.  Voicemails left after 4:00 p.m. will not be returned until the following business day.  For prescription refill requests, have your pharmacy contact our office and allow 72 hours.    Cancer Center Support Programs:   > Cancer Support Group  2nd Tuesday of the month 1pm-2pm, Journey Room

## 2020-07-21 ENCOUNTER — Other Ambulatory Visit: Payer: Self-pay | Admitting: Family Medicine

## 2020-07-21 ENCOUNTER — Ambulatory Visit (HOSPITAL_COMMUNITY): Payer: Medicare Other | Admitting: Hematology

## 2020-07-22 ENCOUNTER — Other Ambulatory Visit: Payer: Self-pay | Admitting: *Deleted

## 2020-07-22 DIAGNOSIS — L409 Psoriasis, unspecified: Secondary | ICD-10-CM

## 2020-07-22 MED ORDER — BETAMETHASONE VALERATE 0.1 % EX OINT: 1.0000 "application " | TOPICAL_OINTMENT | Freq: Two times a day (BID) | CUTANEOUS | 2 refills | Status: DC

## 2020-07-25 ENCOUNTER — Other Ambulatory Visit: Payer: Self-pay

## 2020-07-25 ENCOUNTER — Inpatient Hospital Stay (HOSPITAL_COMMUNITY): Payer: 59

## 2020-07-25 DIAGNOSIS — D751 Secondary polycythemia: Secondary | ICD-10-CM | POA: Diagnosis not present

## 2020-07-25 NOTE — Patient Instructions (Signed)
You had your phlebotomy today. Follow up as scheduled in 2 months unless we call you to say otherwise.

## 2020-07-25 NOTE — Progress Notes (Signed)
Patient is here today for phlebotomy per MD orders.  She is to have 1 unit drawn off.  Her starting hgb is 16.1 and hct 46.9. started at 1417, end at 1427.  Patient's vital sign stable.  She was discharged ambulatory and in stable condition from clinic.  She will follow up in 2 months as scheduled.

## 2020-07-26 ENCOUNTER — Other Ambulatory Visit (HOSPITAL_COMMUNITY): Payer: Self-pay | Admitting: *Deleted

## 2020-07-26 DIAGNOSIS — D751 Secondary polycythemia: Secondary | ICD-10-CM

## 2020-08-03 ENCOUNTER — Other Ambulatory Visit: Payer: Self-pay | Admitting: Family Medicine

## 2020-08-03 ENCOUNTER — Other Ambulatory Visit: Payer: Self-pay

## 2020-08-03 DIAGNOSIS — R928 Other abnormal and inconclusive findings on diagnostic imaging of breast: Secondary | ICD-10-CM

## 2020-08-09 ENCOUNTER — Other Ambulatory Visit: Payer: 59

## 2020-08-09 ENCOUNTER — Other Ambulatory Visit: Payer: Self-pay

## 2020-08-11 NOTE — Progress Notes (Signed)
Virtual Visit via Telephone Note  I connected with Rebecca Moreno on 08/18/20 at  1:20 PM EDT by telephone and verified that I am speaking with the correct person using two identifiers.  Location: Patient: home Provider: office Persons participated in the visit- patient, provider   I discussed the limitations, risks, security and privacy concerns of performing an evaluation and management service by telephone and the availability of in person appointments. I also discussed with the patient that there may be a patient responsible charge related to this service. The patient expressed understanding and agreed to proceed.   I discussed the assessment and treatment plan with the patient. The patient was provided an opportunity to ask questions and all were answered. The patient agreed with the plan and demonstrated an understanding of the instructions.   The patient was advised to call back or seek an in-person evaluation if the symptoms worsen or if the condition fails to improve as anticipated.  I provided 12 minutes of non-face-to-face time during this encounter.   Norman Clay, MD    [facility] MD/PA/NP OP Progress Note  08/18/2020 1:56 PM Rebecca Moreno  MRN:  [id number]  Chief Complaint:  Chief Complaint    Follow-up; Depression     HPI:  This is a follow-up appointment for depression.  She states that she has been doing good.  She goes out 4 times a week.  She also meet with her friends, and eat out at times.  She reports good relationship with her daughter.  Her daughter is working at the medical office.  Her daughter has been busy going to school.  The situation with her husband and her mother-in-law is the same.  Her mother-in-law has occasional spells of feeling confused; she does not know what she was going to do.  Her sister-in-law visits at times.  Although she has depressive symptoms as in PHQ-9, she has been handling things relatively good.  She feels anxious at times. She  denies SI.  When she was asked to lower the dose of Vraylar/quetiapine due to the concern of its side effect, she states that she wants to stay on the medication as she believes these are helping her.  She also declined in person visit as the parent on offices for her from her home.  She denies other concerns at this time.  She denies alcohol or drug use.    Daily routine:visits her daughter, who lives 15 miles away, goes to gym 3 times per week with her daughter Employment:on disability after TMJ surgery in 2007. 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 Her parents had marital discordance and the patient screamed when she was a child as she did not want to hear them. She reports good relationship with both of her parents otherwise. She has one sister, who she has good relationship with  Visit Diagnosis:    ICD-10-CM   1. MDD (major depressive disorder), recurrent episode, mild ([place])  F33.0     Past Psychiatric History: Please see initial evaluation for full details. I have reviewed the history. No updates at this time.     Past Medical History:  Past Medical History:  Diagnosis Date  . Acute pyelonephritis   . Asthma   . Bipolar disorder ([place])   . Colon polyp   . Complication of anesthesia    "sometimes hard to put to sleep"  .  Depression    Bipolar  . Gastric ulcer    around 2013.  stress related  . GERD (gastroesophageal reflux disease)   . History of kidney stones   . Hyperlipidemia   . Hypothyroid   . Polyp, uterus corpus   . Sepsis due to Escherichia coli (E. coli) ([place]) 09/04/2018  . Sleep apnea    no cpap used  . Stroke [facility])     Past Surgical History:  Procedure Laterality Date  . ABLATION ON ENDOMETRIOSIS    . COLONOSCOPY     Per patient, done around 2013 in [place], had polyp and overdue for follow up.  . COLONOSCOPY WITH PROPOFOL N/A  05/12/2018   Procedure: COLONOSCOPY WITH PROPOFOL;  Surgeon: Daneil Dolin, MD;  Location: [facility] SU[facility];  Service: Endoscopy;  Laterality: N/A;  12:00pm  . CYSTOSCOPY W/ URETERAL STENT PLACEMENT Left 09/01/2018   Procedure: CYSTOSCOPY WITH RETROGRADE PYELOGRAM/URETERAL STENT PLACEMENT;  Surgeon: Ceasar Mons, MD;  Location: [facility] ORS;  Service: Urology;  Laterality: Left;  . CYSTOSCOPY/URETEROSCOPY/HOLMIUM LASER/STENT PLACEMENT Left 09/17/2018   Procedure: CYSTOSCOPY/URETEROSCOPY/HOLMIUM LASER/STENT PLACEMENT;  Surgeon: Ceasar Mons, MD;  Location: [facility] ORS;  Service: Urology;  Laterality: Left;  ONLY NEEDS 45 MIN  . OVARIAN CYST SURGERY Left   . POLYPECTOMY  05/12/2018   Procedure: POLYPECTOMY;  Surgeon: Daneil Dolin, MD;  Location: [facility];  Service: Endoscopy;;  polyp at splenic flexure  . TEMPOROMANDIBULAR JOINT SURGERY     9 surgeries  . TUBAL LIGATION    . vaginal childbirth     1    Family Psychiatric History: Please see initial evaluation for full details. I have reviewed the history. No updates at this time.     Family History:  Family History  Problem Relation Age of Onset  . Anxiety disorder Mother   . Hypertension Mother   . Heart failure Mother   . Stroke Mother   . Hyperlipidemia Father   . Diabetes Father   . Stroke Father   . Hypertension Father   . Colon polyps Father        older than 91  . Diabetes Sister   . Hypertension Sister   . Ovarian cancer Sister   . Asthma Daughter   . Ovarian cancer Maternal Grandmother   . Colon cancer Maternal Grandmother   . Renal Disease Paternal Grandmother   . Heart attack Paternal Grandfather   . Bipolar disorder Other     Social History:  Social History   Socioeconomic History  . Marital status: Married    Spouse name: Not on file  . Number of children: 1  . Years of education: Not on file  . Highest education level: High school graduate  Occupational History  . Occupation:  disability  Tobacco Use  . Smoking status: Former Smoker    Packs/day: 0.50    Years: 30.00    Pack years: 15.00    Types: Cigarettes    Quit date: 09/06/2019    Years since quitting: 0.9  . Smokeless tobacco: Never Used  Vaping Use  . Vaping Use: Never used  Substance and Sexual Activity  . Alcohol use: Never  . Drug use: Not Currently  . Sexual activity: Not Currently    Birth control/protection: Surgical    Comment: tubal  Other Topics Concern  . Not on file  Social History Narrative  . Not on file   Social Determinants of Health   Financial Resource Strain: Not on file  Food Insecurity: Not on file  Transportation Needs: Not on file  Physical Activity: Not on file  Stress: Not on file  Social Connections: Not on file    Allergies:  Allergies  Allergen Reactions  . Lithium Other (See Comments)    Psoriasis     Metabolic Disorder Labs: No results found for: HGBA1C, MPG No results found for: PROLACTIN Lab Results  Component Value Date   CHOL 212 (H) 12/18/2018   TRIG 171 (H) 12/18/2018   HDL 44 12/18/2018   CHOLHDL 4.8 (H) 12/18/2018   LDLCALC 134 (H) 12/18/2018   LDLCALC 168 (H) 01/15/2018   Lab Results  Component Value Date   TSH 0.912 07/06/2020   TSH 0.718 12/24/2019    Therapeutic Level Labs: No results found for: LITHIUM No results found for: VALPROATE No components found for:  CBMZ  Current Medications: Current Outpatient Medications  Medication Sig Dispense Refill  . albuterol (PROVENTIL HFA;VENTOLIN HFA) 108 (90 Base) MCG/ACT inhaler Inhale 1 puff into the lungs every 6 (six) hours as needed for wheezing or shortness of breath.    Marland Kitchen aspirin 325 MG tablet Take 1 tablet by mouth daily.    Marland Kitchen atorvastatin (LIPITOR) 80 MG tablet TAKE ONE TABLET AT BEDTIME 90 tablet 3  . betamethasone valerate ointment (VALISONE) 0.1 % Apply 1 application topically 2 (two) times daily. Until the plaque resolves.  Then use as needed for each flareup. DO NOT APPLY  TO FACE/GROIN/AXILLA 45 g 2  . CALCIUM CITRATE-VITAMIN D3 PO Take 1 tablet by mouth daily.    Derrill Memo ON 09/25/2020] Cariprazine HCl (VRAYLAR) 6 MG CAPS Take 1 capsule (6 mg total) by mouth daily. 90 capsule 1  . Cholecalciferol (VITAMIN D3) 5000 units TABS Take 5,000 Units by mouth daily.     Derrill Memo ON 09/24/2020] clonazePAM (KLONOPIN) 1 MG tablet Take 1 tablet (1 mg total) by mouth 2 (two) times daily as needed for anxiety. 60 tablet 1  . Coal Tar Extract 3 % SHAM Apply 1 application topically every morning. 240 mL 1  . DEXILANT 30 MG capsule TAKE (1) CAPSULE DAILY 90 capsule 3  . famotidine (PEPCID) 20 MG tablet TAKE 1 TABLET DAILY 90 tablet 3  . fluticasone (FLONASE) 50 MCG/ACT nasal spray SPRAY 2 SPRAYS INTO EACH NOSTRIL EVERY DAY 48 mL 0  . fluticasone (FLOVENT HFA) 220 MCG/ACT inhaler Inhale 1 puff into the lungs daily.     Marland Kitchen gabapentin (NEURONTIN) 300 MG capsule Take 600 mg by mouth at bedtime.    . hydrocortisone 1 % lotion Apply 1 application topically 2 (two) times daily. 118 mL 0  . lamoTRIgine (LAMICTAL) 100 MG tablet Take 3 tablets (300 mg total) by mouth at bedtime. 270 tablet 1  . levothyroxine (SYNTHROID) 50 MCG tablet Take 1 tablet (50 mcg total) by mouth daily. 30 tablet 11  . loratadine (CLARITIN) 10 MG tablet TAKE 1 TABLET DAILY 30 tablet 11  . magnesium gluconate (MAGONATE) 500 MG tablet Take 500 mg by mouth daily. (Patient not taking: Reported on 07/25/2020)    . mineral oil-hydrophilic petrolatum (AQUAPHOR) ointment Apply topically as needed for dry skin. 420 g 0  . Multiple Vitamins-Minerals (MULTIVITAMIN WOMEN PO) Take 1 tablet by mouth daily.    . QUEtiapine (SEROQUEL) 300 MG tablet Take 1 tablet (300 mg total) by mouth at bedtime. 90 tablet 1  . varenicline (CHANTIX CONTINUING MONTH PAK) 1 MG tablet Take 1 tablet (1 mg total) by mouth 2 (two) times daily.  180 tablet 1  . venlafaxine XR (EFFEXOR-XR) 150 MG 24 hr capsule Take 2 capsules (300 mg total) by mouth daily. 180  capsule 1   No current facility-administered medications for this visit.     Musculoskeletal: Strength & Muscle Tone: N/A Gait & Station: N/A Patient leans: N/A  Psychiatric Specialty Exam: Review of Systems  Psychiatric/Behavioral: Positive for dysphoric mood. Negative for agitation, behavioral problems, confusion, decreased concentration, hallucinations, self-injury, sleep disturbance and suicidal ideas. The patient is nervous/anxious. The patient is not hyperactive.   All other systems reviewed and are negative.   Last menstrual period 12/13/2017.There is no height or weight on file to calculate BMI.  General Appearance: N?A  Eye Contact:  NA  Speech:  Clear and Coherent  Volume:  Normal  Mood:  good  Affect:  NA  Thought Process:  Coherent  Orientation:  Full (Time, Place, and Person)  Thought Content: Logical   Suicidal Thoughts:  No  Homicidal Thoughts:  No  Memory:  Immediate;   Good  Judgement:  Good  Insight:  Fair  Psychomotor Activity:  Normal  Concentration:  Concentration: Good and Attention Span: Good  Recall:  Good  Fund of Knowledge: Good  Language: Good  Akathisia:  No  Handed:  Right  AIMS (if indicated): not done  Assets:  Communication Skills Desire for Improvement  ADL's:  Intact  Cognition: WNL  Sleep:  Fair   Screenings: GAD-7   Flowsheet Row Office Visit from 06/29/2020 in [place] Visit from 08/19/2019 in [place] Visit from 04/16/2018 in [place]  Total GAD-7 Score 10 11 3     Mini-Mental   [place] from 07/22/2018 in [place]  Total Score (max 30 points ) 30    PHQ2-9   Flowsheet Row Video Visit from 08/18/2020 in [place] Office Visit from 07/06/2020 in [place] Visit from 06/29/2020 in [place] Visit from  06/01/2020 in [place] Office Visit from 12/24/2019 in [place]  PHQ-2 Total Score 2 1 2 3 1   PHQ-9 Total Score 4 5 4 12 4     Flowsheet Row Video Visit from 08/18/2020 in [place] No Risk       Assessment and Plan:  CHIYOKO TORRICO is a 55 y.o. year old female with a history of bipolar disorder by history, hypothyroidism, who presents for follow up appointment for below.   1. MDD (major depressive disorder), recurrent episode, mild ([place]) # Bipolar I disorder by history She reports slight improvement in her depressive symptoms since the last visit.   Psychosocial stressors includes marital conflict with her husband in separation at home, taking care of her mother-in-law with dementia.   Although it has been discussed again about reducing either of her medication to avoid polypharmacy, the patient has strong preference to stay on the current medication regimen.  We will continue venlafaxine to target depression.  We will continue lamotrigine, Vraylar, quetiapine for mood dysregulation.  We will continue clonazepam as needed for anxiety. This clinician has discussed the side effect associated with medication prescribed during this encounter. Please refer to notes in the previous encounters for more details.  Noted that although the patient was reportedly diagnosed with bipolar I disorder in the past,both the patient and her husband denies any manic/hypomanic episode except irritability,paranoia in the context  of depression.  Plan I have reviewed and updated plans as below 1. Continue Venlafaxine 300 mg daily  2. Continue Lamotrigine 300 mg daily  3. Continue Vraylar 6 mg  4. Continue Quetiapine 300 mg at night  5. Continue Clonazepam 1 mg twice a dayas needed for anxiety 6.Next appointment:7/12 at 1:20 for 20 mins, phone. She declined in person visit due to location - Will advise  the patient to obtain lipid panels/glucose at the next visit    Past trials of medication:sertraline, fluoxetine, lexapro,Celexa, duloxetine,Wellbutrinlithium (psoriasis), Depakote(did not work), olanzapine, Abilify(weight gain), latuda, Geodon  The patient demonstrates the following risk factors for suicide: Chronic risk factors for suicide include:psychiatric disorder ofdepression. Acute risk factorsfor suicide include: family or marital conflict and unemployment. Protective factorsfor this patient include: positive social support and hope for the future. Considering these factors, the overall suicide risk at this point appears to below. Patientisappropriate for outpatient follow up.  Norman Clay, MD 08/18/2020, 1:56 PM

## 2020-08-18 ENCOUNTER — Other Ambulatory Visit: Payer: Self-pay

## 2020-08-18 ENCOUNTER — Encounter: Payer: Self-pay | Admitting: Psychiatry

## 2020-08-18 ENCOUNTER — Telehealth (INDEPENDENT_AMBULATORY_CARE_PROVIDER_SITE_OTHER): Payer: 59 | Admitting: Psychiatry

## 2020-08-18 DIAGNOSIS — F330 Major depressive disorder, recurrent, mild: Secondary | ICD-10-CM | POA: Diagnosis not present

## 2020-08-18 MED ORDER — VRAYLAR 6 MG PO CAPS: 1.0000 | ORAL_CAPSULE | Freq: Every day | ORAL | 1 refills | Status: DC

## 2020-08-18 MED ORDER — LAMOTRIGINE 100 MG PO TABS: 300.0000 mg | ORAL_TABLET | Freq: Every day | ORAL | 1 refills | Status: DC

## 2020-08-18 MED ORDER — QUETIAPINE FUMARATE 300 MG PO TABS: 300.0000 mg | ORAL_TABLET | Freq: Every day | ORAL | 1 refills | Status: DC

## 2020-08-18 MED ORDER — VENLAFAXINE HCL ER 150 MG PO CP24: 300.0000 mg | ORAL_CAPSULE | Freq: Every day | ORAL | 1 refills | Status: DC

## 2020-08-18 MED ORDER — CLONAZEPAM 1 MG PO TABS: 1.0000 mg | ORAL_TABLET | Freq: Two times a day (BID) | ORAL | 1 refills | Status: DC | PRN

## 2020-08-18 NOTE — Patient Instructions (Signed)
1. Continue Venlafaxine 300 mg daily  2. Continue Lamotrigine 300 mg daily  3. Continue Vraylar 6 mg  4. Continue Quetiapine 300 mg at night  5. Continue Clonazepam 1 mg twice a dayas needed for anxiety 6.Next appointment:7/12 at 1:20

## 2020-09-05 ENCOUNTER — Inpatient Hospital Stay (HOSPITAL_COMMUNITY): Payer: 59

## 2020-09-05 ENCOUNTER — Inpatient Hospital Stay (HOSPITAL_COMMUNITY): Payer: 59 | Attending: Hematology

## 2020-09-05 ENCOUNTER — Other Ambulatory Visit: Payer: Self-pay

## 2020-09-05 ENCOUNTER — Other Ambulatory Visit (HOSPITAL_COMMUNITY): Payer: Self-pay

## 2020-09-05 DIAGNOSIS — D751 Secondary polycythemia: Secondary | ICD-10-CM

## 2020-09-05 LAB — CBC
HCT: 47.7 % — ABNORMAL HIGH (ref 36.0–46.0)
Hemoglobin: 15.5 g/dL — ABNORMAL HIGH (ref 12.0–15.0)
MCH: 34.7 pg — ABNORMAL HIGH (ref 26.0–34.0)
MCHC: 32.5 g/dL (ref 30.0–36.0)
MCV: 106.7 fL — ABNORMAL HIGH (ref 80.0–100.0)
Platelets: 221 10*3/uL (ref 150–400)
RBC: 4.47 MIL/uL (ref 3.87–5.11)
RDW: 13.4 % (ref 11.5–15.5)
WBC: 9.4 10*3/uL (ref 4.0–10.5)
nRBC: 0 % (ref 0.0–0.2)

## 2020-09-05 LAB — DIFFERENTIAL
Abs Immature Granulocytes: 0.05 10*3/uL (ref 0.00–0.07)
Basophils Absolute: 0.1 10*3/uL (ref 0.0–0.1)
Basophils Relative: 1 %
Eosinophils Absolute: 0 10*3/uL (ref 0.0–0.5)
Eosinophils Relative: 0 %
Immature Granulocytes: 1 %
Lymphocytes Relative: 15 %
Lymphs Abs: 1.4 10*3/uL (ref 0.7–4.0)
Monocytes Absolute: 0.7 10*3/uL (ref 0.1–1.0)
Monocytes Relative: 7 %
Neutro Abs: 7.1 10*3/uL (ref 1.7–7.7)
Neutrophils Relative %: 76 %

## 2020-11-09 DIAGNOSIS — R0902 Hypoxemia: Secondary | ICD-10-CM | POA: Diagnosis not present

## 2020-11-09 DIAGNOSIS — J449 Chronic obstructive pulmonary disease, unspecified: Secondary | ICD-10-CM | POA: Diagnosis not present

## 2020-11-09 DIAGNOSIS — G4733 Obstructive sleep apnea (adult) (pediatric): Secondary | ICD-10-CM | POA: Diagnosis not present

## 2020-11-13 NOTE — Progress Notes (Signed)
Virtual Visit via Telephone Note  I connected with Rebecca Moreno on 11/15/20 at  1:20 PM EDT by telephone and verified that I am speaking with the correct person using two identifiers.  Location: Patient: home Provider: office Persons participated in the visit- patient, provider    I discussed the limitations, risks, security and privacy concerns of performing an evaluation and management service by telephone and the availability of in person appointments. I also discussed with the patient that there may be a patient responsible charge related to this service. The patient expressed understanding and agreed to proceed.    I discussed the assessment and treatment plan with the patient. The patient was provided an opportunity to ask questions and all were answered. The patient agreed with the plan and demonstrated an understanding of the instructions.   The patient was advised to call back or seek an in-person evaluation if the symptoms worsen or if the condition fails to improve as anticipated.  I provided 12 minutes of non-face-to-face time during this encounter.   Norman Clay, MD    [facility] MD/PA/NP OP Progress Note  11/15/2020 2:00 PM Rebecca Moreno  MRN:  [id number]  Chief Complaint:  Chief Complaint   Follow-up; Depression; Other    HPI:  This is a follow-up appointment for depression.  She states that her mother fell several days ago, and needs to use a walker.  Her mother cannot be left alone due to her current condition.  She feels down as she is unable to go outside as much as she is hoping.  However, she is trying to go out, or goes back to her room when she feels stressed.  She states that the relationship with her husband has been the same.  She does not like that her husband goes out every night to shoot pool as she wants to have more time with him.  She feels good about weight loss since eating healthier food.  She wants to stay on the current medication as she is able to  lose her weight this way.  She has fair sleep.  She feels depressed and has anhedonia at times.  She denies SI.  She denies decreased need for sleep or euphonia.    Daily routine: visits her daughter, who lives 15 miles away, goes to gym 3 times per week with her daughter Employment: on disability after TMJ surgery in 2007. 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 Her parents had marital discordance and the patient screamed when she was a child as she did not want to hear them. She reports good relationship with both of her parents otherwise. She has one sister, who she has good relationship with  185 lbs Wt Readings from Last 3 Encounters:  07/18/20 196 lb 14.4 oz (89.3 kg)  07/06/20 196 lb (88.9 kg)  06/29/20 195 lb 3.2 oz (88.5 kg)     Visit Diagnosis:    ICD-10-CM   1. MDD (major depressive disorder), recurrent episode, mild ([place])  F33.0       Past Psychiatric History: Please see initial evaluation for full details. I have reviewed the history. No updates at this time.     Past Medical History:  Past Medical History:  Diagnosis Date   Acute pyelonephritis    Asthma    Bipolar disorder ([place])    Colon polyp  Complication of anesthesia    "sometimes hard to put to sleep"   Depression    Bipolar   Gastric ulcer    around 2013.  stress related   GERD (gastroesophageal reflux disease)    History of kidney stones    Hyperlipidemia    Hypothyroid    Polyp, uterus corpus    Sepsis due to Escherichia coli (E. coli) ([place]) 09/04/2018   Sleep apnea    no cpap used   Stroke [facility])     Past Surgical History:  Procedure Laterality Date   ABLATION ON ENDOMETRIOSIS     COLONOSCOPY     Per patient, done around 2013 in [place], had polyp and overdue for follow up.   COLONOSCOPY WITH PROPOFOL N/A 05/12/2018   Procedure: COLONOSCOPY WITH PROPOFOL;  Surgeon:  Daneil Dolin, MD;  Location: [facility];  Service: Endoscopy;  Laterality: N/A;  12:00pm   CYSTOSCOPY W/ URETERAL STENT PLACEMENT Left 09/01/2018   Procedure: CYSTOSCOPY WITH RETROGRADE PYELOGRAM/URETERAL STENT PLACEMENT;  Surgeon: Ceasar Mons, MD;  Location: [facility] ORS;  Service: Urology;  Laterality: Left;   CYSTOSCOPY/URETEROSCOPY/HOLMIUM LASER/STENT PLACEMENT Left 09/17/2018   Procedure: CYSTOSCOPY/URETEROSCOPY/HOLMIUM LASER/STENT PLACEMENT;  Surgeon: Ceasar Mons, MD;  Location: [facility];  Service: Urology;  Laterality: Left;  ONLY NEEDS 45 MIN   OVARIAN CYST SURGERY Left    POLYPECTOMY  05/12/2018   Procedure: POLYPECTOMY;  Surgeon: Daneil Dolin, MD;  Location: [facility];  Service: Endoscopy;;  polyp at splenic flexure   TEMPOROMANDIBULAR JOINT SURGERY     9 surgeries   TUBAL LIGATION     vaginal childbirth     1    Family Psychiatric History: Please see initial evaluation for full details. I have reviewed the history. No updates at this time.     Family History:  Family History  Problem Relation Age of Onset   Anxiety disorder Mother    Hypertension Mother    Heart failure Mother    Stroke Mother    Hyperlipidemia Father    Diabetes Father    Stroke Father    Hypertension Father    Colon polyps Father        older than 92   Diabetes Sister    Hypertension Sister    Ovarian cancer Sister    Asthma Daughter    Ovarian cancer Maternal Grandmother    Colon cancer Maternal Grandmother    Renal Disease Paternal Grandmother    Heart attack Paternal Grandfather    Bipolar disorder Other     Social History:  Social History   Socioeconomic History   Marital status: Married    Spouse name: Not on file   Number of children: 1   Years of education: Not on file   Highest education level: High school graduate  Occupational History   Occupation: disability  Tobacco Use   Smoking status: Former    Packs/day: 0.50    Years: 30.00    Pack  years: 15.00    Types: Cigarettes    Quit date: 09/06/2019    Years since quitting: 1.1   Smokeless tobacco: Never  Vaping Use   Vaping Use: Never used  Substance and Sexual Activity   Alcohol use: Never   Drug use: Not Currently   Sexual activity: Not Currently    Birth control/protection: Surgical    Comment: tubal  Other Topics Concern   Not on file  Social History Narrative   Not on file   Social  Determinants of Health   Financial Resource Strain: Not on file  Food Insecurity: Not on file  Transportation Needs: Not on file  Physical Activity: Not on file  Stress: Not on file  Social Connections: Not on file    Allergies:  Allergies  Allergen Reactions   Lithium Other (See Comments)    Psoriasis     Metabolic Disorder Labs: No results found for: HGBA1C, MPG No results found for: PROLACTIN Lab Results  Component Value Date   CHOL 212 (H) 12/18/2018   TRIG 171 (H) 12/18/2018   HDL 44 12/18/2018   CHOLHDL 4.8 (H) 12/18/2018   LDLCALC 134 (H) 12/18/2018   LDLCALC 168 (H) 01/15/2018   Lab Results  Component Value Date   TSH 0.912 07/06/2020   TSH 0.718 12/24/2019    Therapeutic Level Labs: No results found for: LITHIUM No results found for: VALPROATE No components found for:  CBMZ  Current Medications: Current Outpatient Medications  Medication Sig Dispense Refill   albuterol (PROVENTIL HFA;VENTOLIN HFA) 108 (90 Base) MCG/ACT inhaler Inhale 1 puff into the lungs every 6 (six) hours as needed for wheezing or shortness of breath.     atorvastatin (LIPITOR) 80 MG tablet TAKE ONE TABLET AT BEDTIME 90 tablet 3   betamethasone valerate ointment (VALISONE) 0.1 % Apply 1 application topically 2 (two) times daily. Until the plaque resolves.  Then use as needed for each flareup. DO NOT APPLY TO FACE/GROIN/AXILLA 45 g 2   CALCIUM CITRATE-VITAMIN D3 PO Take 1 tablet by mouth daily.     Cariprazine HCl (VRAYLAR) 6 MG CAPS Take 1 capsule (6 mg total) by mouth daily. 90  capsule 1   Cholecalciferol (VITAMIN D3) 5000 units TABS Take 5,000 Units by mouth daily.      [START ON 12/17/2020] clonazePAM (KLONOPIN) 1 MG tablet Take 1 tablet (1 mg total) by mouth 2 (two) times daily as needed for anxiety. 60 tablet 1   Coal Tar Extract 3 % SHAM Apply 1 application topically every morning. 240 mL 1   DEXILANT 30 MG capsule TAKE (1) CAPSULE DAILY 90 capsule 3   famotidine (PEPCID) 20 MG tablet TAKE 1 TABLET DAILY 90 tablet 3   fluticasone (FLONASE) 50 MCG/ACT nasal spray SPRAY 2 SPRAYS INTO EACH NOSTRIL EVERY DAY 48 mL 0   fluticasone (FLOVENT HFA) 220 MCG/ACT inhaler Inhale 1 puff into the lungs daily.      gabapentin (NEURONTIN) 300 MG capsule Take 600 mg by mouth at bedtime.     hydrocortisone 1 % lotion Apply 1 application topically 2 (two) times daily. 118 mL 0   lamoTRIgine (LAMICTAL) 100 MG tablet Take 3 tablets (300 mg total) by mouth at bedtime. 270 tablet 1   levothyroxine (SYNTHROID) 50 MCG tablet Take 1 tablet (50 mcg total) by mouth daily. 30 tablet 11   loratadine (CLARITIN) 10 MG tablet TAKE 1 TABLET DAILY 30 tablet 11   magnesium gluconate (MAGONATE) 500 MG tablet Take 500 mg by mouth daily. (Patient not taking: Reported on 07/25/2020)     mineral oil-hydrophilic petrolatum (AQUAPHOR) ointment Apply topically as needed for dry skin. 420 g 0   Multiple Vitamins-Minerals (MULTIVITAMIN WOMEN PO) Take 1 tablet by mouth daily.     QUEtiapine (SEROQUEL) 300 MG tablet Take 1 tablet (300 mg total) by mouth at bedtime. 90 tablet 1   varenicline (CHANTIX CONTINUING MONTH PAK) 1 MG tablet Take 1 tablet (1 mg total) by mouth 2 (two) times daily. 180 tablet 1  venlafaxine XR (EFFEXOR-XR) 150 MG 24 hr capsule Take 2 capsules (300 mg total) by mouth daily. 180 capsule 1   No current facility-administered medications for this visit.     Musculoskeletal: Strength & Muscle Tone:  N/A Gait & Station:  N/A Patient leans: N/A  Psychiatric Specialty Exam: Review of  Systems  Psychiatric/Behavioral:  Positive for dysphoric mood and sleep disturbance. Negative for agitation, behavioral problems, confusion, decreased concentration, hallucinations, self-injury and suicidal ideas. The patient is nervous/anxious. The patient is not hyperactive.   All other systems reviewed and are negative.  Last menstrual period 12/13/2017.There is no height or weight on file to calculate BMI.  General Appearance: NA  Eye Contact:  NA  Speech:  Clear and Coherent  Volume:  Normal  Mood:   fine  Affect:  NA  Thought Process:  Coherent  Orientation:  Full (Time, Place, and Person)  Thought Content: Logical   Suicidal Thoughts:  No  Homicidal Thoughts:  No  Memory:  Immediate;   Good  Judgement:  Good  Insight:  Good  Psychomotor Activity:  Normal  Concentration:  Concentration: Good and Attention Span: Good  Recall:  Good  Fund of Knowledge: Good  Language: Good  Akathisia:  No  Handed:  Right  AIMS (if indicated): not done  Assets:  Communication Skills Desire for Improvement  ADL's:  Intact  Cognition: WNL  Sleep:  Fair   Screenings: GAD-7    Flowsheet Row Office Visit from 06/29/2020 in [facility] [place] Office Visit from 08/19/2019 in [facility] Visit from 04/16/2018 in [facility]  Total GAD-7 Score 10 11 3       Mini-Mental    [place] from 07/22/2018 in [place]  Total Score (max 30 points ) 30      PHQ2-9    Flowsheet Row Video Visit from 08/18/2020 in [facility] Office Visit from 07/06/2020 in [place] Visit from 06/29/2020 in [place] Visit from 06/01/2020 in [place] Visit from 12/24/2019 in [facility] [place]  PHQ-2 Total Score 2 1 2 3 1   PHQ-9 Total Score 4 5 4 12 4       Flowsheet Row  Video Visit from 11/15/2020 in [facility] Video Visit from 08/18/2020 in [place] No Risk No Risk        Assessment and Plan:  Rebecca Moreno is a 55 y.o. year old female with a history of depression, hypothyroidism, who presents for follow up appointment for below.   1. MDD (major depressive disorder), recurrent episode, mild ([place]) # Bipolar I disorder by history Although she reports occasional depressive symptoms of anxiety in the setting her mother-in-law having fall, she has been handling things relatively well.  Other psychosocial stressors includes marital conflict with her husband. Although it has been discussed again about reducing either of her medication to avoid polypharmacy, the patient has strong preference to stay on the current medication regimen.  Will continue venlafaxine to target depression.  Will continue lamotrigine, Vraylar, and quetiapine to target mood dysregulation.  Will continue clonazepam as needed for anxiety. Noted that although the patient was reportedly diagnosed with bipolar I disorder in the past, both the patient and her husband denies any manic/hypomanic episode except irritability, paranoia in the context of depression.       Plan I have  reviewed and updated plans as below 1. Continue Venlafaxine 300 mg daily 2. Continue Lamotrigine 300 mg daily 3. Continue Vraylar 6 mg 4. Continue Quetiapine 300 mg at night 5. Continue Clonazepam 1 mg twice a day as needed for anxiety  6. Next appointment: 10/5 at 1:20 for 20 mins, phone. She declined in person visit due to location - She has an upcoming appointment with her PCP in Sept. She was advised to obtain lipid panels/glucose at the next visit   This clinician has discussed the side effect associated with medication prescribed during this encounter. Please refer to notes in the previous encounters for more details.       Past  trials of medication: sertraline, fluoxetine, lexapro, Celexa, duloxetine, Wellbutrin  lithium (psoriasis), Depakote (did not work), olanzapine, Abilify (weight gain), latuda, Geodon    The patient demonstrates the following risk factors for suicide: Chronic risk factors for suicide include: psychiatric disorder of depression. Acute risk factors for suicide include: family or marital conflict and unemployment. Protective factors for this patient include: positive social support and hope for the future. Considering these factors, the overall suicide risk at this point appears to be low. Patient is appropriate for outpatient follow up.     Norman Clay, MD 11/15/2020, 2:00 PM

## 2020-11-15 ENCOUNTER — Other Ambulatory Visit: Payer: Self-pay

## 2020-11-15 ENCOUNTER — Telehealth (INDEPENDENT_AMBULATORY_CARE_PROVIDER_SITE_OTHER): Payer: Medicare Other | Admitting: Psychiatry

## 2020-11-15 ENCOUNTER — Encounter: Payer: Self-pay | Admitting: Psychiatry

## 2020-11-15 DIAGNOSIS — F330 Major depressive disorder, recurrent, mild: Secondary | ICD-10-CM | POA: Diagnosis not present

## 2020-11-15 MED ORDER — CLONAZEPAM 1 MG PO TABS: 1.0000 mg | ORAL_TABLET | Freq: Two times a day (BID) | ORAL | 1 refills | Status: DC | PRN

## 2020-11-15 NOTE — Patient Instructions (Signed)
1. Continue Venlafaxine 300 mg daily 2. Continue Lamotrigine 300 mg daily 3. Continue Vraylar 6 mg 4. Continue Quetiapine 300 mg at night 5. Continue Clonazepam 1 mg twice a day as needed for anxiety  6. Next appointment: 10/5 at 1:20

## 2020-11-16 DIAGNOSIS — H5213 Myopia, bilateral: Secondary | ICD-10-CM | POA: Diagnosis not present

## 2021-01-06 ENCOUNTER — Encounter: Payer: 59 | Admitting: Family Medicine

## 2021-01-10 DIAGNOSIS — G4733 Obstructive sleep apnea (adult) (pediatric): Secondary | ICD-10-CM | POA: Diagnosis not present

## 2021-01-10 DIAGNOSIS — R0902 Hypoxemia: Secondary | ICD-10-CM | POA: Diagnosis not present

## 2021-01-10 DIAGNOSIS — J449 Chronic obstructive pulmonary disease, unspecified: Secondary | ICD-10-CM | POA: Diagnosis not present

## 2021-01-13 ENCOUNTER — Ambulatory Visit: Payer: Medicare Other

## 2021-01-18 ENCOUNTER — Inpatient Hospital Stay (HOSPITAL_COMMUNITY): Payer: Medicare Other | Attending: Hematology

## 2021-01-18 ENCOUNTER — Other Ambulatory Visit: Payer: Self-pay

## 2021-01-18 DIAGNOSIS — R4701 Aphasia: Secondary | ICD-10-CM | POA: Diagnosis not present

## 2021-01-18 DIAGNOSIS — F319 Bipolar disorder, unspecified: Secondary | ICD-10-CM | POA: Insufficient documentation

## 2021-01-18 DIAGNOSIS — M791 Myalgia, unspecified site: Secondary | ICD-10-CM | POA: Insufficient documentation

## 2021-01-18 DIAGNOSIS — K219 Gastro-esophageal reflux disease without esophagitis: Secondary | ICD-10-CM | POA: Insufficient documentation

## 2021-01-18 DIAGNOSIS — E785 Hyperlipidemia, unspecified: Secondary | ICD-10-CM | POA: Insufficient documentation

## 2021-01-18 DIAGNOSIS — Z7982 Long term (current) use of aspirin: Secondary | ICD-10-CM | POA: Diagnosis not present

## 2021-01-18 DIAGNOSIS — F17210 Nicotine dependence, cigarettes, uncomplicated: Secondary | ICD-10-CM | POA: Diagnosis not present

## 2021-01-18 DIAGNOSIS — E039 Hypothyroidism, unspecified: Secondary | ICD-10-CM | POA: Diagnosis not present

## 2021-01-18 DIAGNOSIS — Z79899 Other long term (current) drug therapy: Secondary | ICD-10-CM | POA: Diagnosis not present

## 2021-01-18 DIAGNOSIS — G4730 Sleep apnea, unspecified: Secondary | ICD-10-CM | POA: Insufficient documentation

## 2021-01-18 DIAGNOSIS — K5900 Constipation, unspecified: Secondary | ICD-10-CM | POA: Insufficient documentation

## 2021-01-18 DIAGNOSIS — K760 Fatty (change of) liver, not elsewhere classified: Secondary | ICD-10-CM | POA: Insufficient documentation

## 2021-01-18 DIAGNOSIS — G8191 Hemiplegia, unspecified affecting right dominant side: Secondary | ICD-10-CM | POA: Diagnosis not present

## 2021-01-18 DIAGNOSIS — N132 Hydronephrosis with renal and ureteral calculous obstruction: Secondary | ICD-10-CM | POA: Insufficient documentation

## 2021-01-18 DIAGNOSIS — Z8711 Personal history of peptic ulcer disease: Secondary | ICD-10-CM | POA: Diagnosis not present

## 2021-01-18 DIAGNOSIS — Z87442 Personal history of urinary calculi: Secondary | ICD-10-CM | POA: Diagnosis not present

## 2021-01-18 DIAGNOSIS — D751 Secondary polycythemia: Secondary | ICD-10-CM | POA: Diagnosis not present

## 2021-01-18 DIAGNOSIS — Z8673 Personal history of transient ischemic attack (TIA), and cerebral infarction without residual deficits: Secondary | ICD-10-CM | POA: Diagnosis not present

## 2021-01-18 LAB — CBC WITH DIFFERENTIAL/PLATELET
Abs Immature Granulocytes: 0.04 10*3/uL (ref 0.00–0.07)
Basophils Absolute: 0.1 10*3/uL (ref 0.0–0.1)
Basophils Relative: 1 %
Eosinophils Absolute: 0 10*3/uL (ref 0.0–0.5)
Eosinophils Relative: 0 %
HCT: 48.9 % — ABNORMAL HIGH (ref 36.0–46.0)
Hemoglobin: 16.1 g/dL — ABNORMAL HIGH (ref 12.0–15.0)
Immature Granulocytes: 0 %
Lymphocytes Relative: 18 %
Lymphs Abs: 2 10*3/uL (ref 0.7–4.0)
MCH: 34.8 pg — ABNORMAL HIGH (ref 26.0–34.0)
MCHC: 32.9 g/dL (ref 30.0–36.0)
MCV: 105.6 fL — ABNORMAL HIGH (ref 80.0–100.0)
Monocytes Absolute: 0.9 10*3/uL (ref 0.1–1.0)
Monocytes Relative: 8 %
Neutro Abs: 8.1 10*3/uL — ABNORMAL HIGH (ref 1.7–7.7)
Neutrophils Relative %: 73 %
Platelets: 207 10*3/uL (ref 150–400)
RBC: 4.63 MIL/uL (ref 3.87–5.11)
RDW: 13.8 % (ref 11.5–15.5)
WBC: 11.1 10*3/uL — ABNORMAL HIGH (ref 4.0–10.5)
nRBC: 0 % (ref 0.0–0.2)

## 2021-01-18 LAB — COMPREHENSIVE METABOLIC PANEL
ALT: 32 U/L (ref 0–44)
AST: 26 U/L (ref 15–41)
Albumin: 4.4 g/dL (ref 3.5–5.0)
Alkaline Phosphatase: 92 U/L (ref 38–126)
Anion gap: 13 (ref 5–15)
BUN: 22 mg/dL — ABNORMAL HIGH (ref 6–20)
CO2: 28 mmol/L (ref 22–32)
Calcium: 9.6 mg/dL (ref 8.9–10.3)
Chloride: 99 mmol/L (ref 98–111)
Creatinine, Ser: 0.95 mg/dL (ref 0.44–1.00)
GFR, Estimated: >60 mL/min (ref >60)
Glucose, Bld: 110 mg/dL — ABNORMAL HIGH (ref 70–99)
Potassium: 4.6 mmol/L (ref 3.5–5.1)
Sodium: 140 mmol/L (ref 135–145)
Total Bilirubin: 0.4 mg/dL (ref 0.3–1.2)
Total Protein: 6.8 g/dL (ref 6.5–8.1)

## 2021-01-24 NOTE — Progress Notes (Signed)
[place], [place] [address]   CLINIC:  Medical Oncology/Hematology  PCP:  [name], DO [address] [place] [address]  [phone]  REASON FOR VISIT:  Follow-up for erythcytosis  PRIOR THERAPY: none  CURRENT THERAPY: Intermittent phlebotomy on 02/05/2019  INTERVAL HISTORY:  Rebecca Moreno, a 55 y.o. female, returns for routine follow-up for her erythcytosis. Rebecca Moreno was last seen on 07/18/2020.  Today she reports feeling good. Her psoriasis has resolved. She is currently smoking 5 cigarettes a day, and has done so for the past 6 months. She denies headaches.  REVIEW OF SYSTEMS:  Review of Systems  Constitutional:  Negative for appetite change (80%) and fatigue (50%).  Gastrointestinal:  Positive for constipation.  Musculoskeletal:  Positive for arthralgias (8/10 R hip).  Neurological:  Negative for headaches.  Psychiatric/Behavioral:  Positive for depression. The patient is nervous/anxious.   All other systems reviewed and are negative.  PAST MEDICAL/SURGICAL HISTORY:  Past Medical History:  Diagnosis Date   Acute pyelonephritis    Asthma    Bipolar disorder ([place])    Colon polyp    Complication of anesthesia    "sometimes hard to put to sleep"   Depression    Bipolar   Gastric ulcer    around 2013.  stress related   GERD (gastroesophageal reflux disease)    History of kidney stones    Hyperlipidemia    Hypothyroid    Polyp, uterus corpus    Sepsis due to Escherichia coli (E. coli) ([place]) 09/04/2018   Sleep apnea    no cpap used   Stroke [facility])    Past Surgical History:  Procedure Laterality Date   ABLATION ON ENDOMETRIOSIS     COLONOSCOPY     Per patient, done around 2013 in [place], had polyp and overdue for follow up.   COLONOSCOPY WITH PROPOFOL N/A 05/12/2018   Procedure: COLONOSCOPY WITH PROPOFOL;  Surgeon: Daneil Dolin, MD;  Location: [facility];  Service: Endoscopy;  Laterality: N/A;   12:00pm   CYSTOSCOPY W/ URETERAL STENT PLACEMENT Left 09/01/2018   Procedure: CYSTOSCOPY WITH RETROGRADE PYELOGRAM/URETERAL STENT PLACEMENT;  Surgeon: Ceasar Mons, MD;  Location: [facility] OR[facility];  Service: Urology;  Laterality: Left;   CYSTOSCOPY/URETEROSCOPY/HOLMIUM LASER/STENT PLACEMENT Left 09/17/2018   Procedure: CYSTOSCOPY/URETEROSCOPY/HOLMIUM LASER/STENT PLACEMENT;  Surgeon: Ceasar Mons, MD;  Location: [facility];  Service: Urology;  Laterality: Left;  ONLY NEEDS 45 MIN   OVARIAN CYST SURGERY Left    POLYPECTOMY  05/12/2018   Procedure: POLYPECTOMY;  Surgeon: Daneil Dolin, MD;  Location: [facility];  Service: Endoscopy;;  polyp at splenic flexure   TEMPOROMANDIBULAR JOINT SURGERY     9 surgeries   TUBAL LIGATION     vaginal childbirth     1    SOCIAL HISTORY:  Social History   Socioeconomic History   Marital status: Married    Spouse name: Not on file   Number of children: 1   Years of education: Not on file   Highest education level: High school graduate  Occupational History   Occupation: disability  Tobacco Use   Smoking status: Former    Packs/day: 0.50    Years: 30.00    Pack years: 15.00    Types: Cigarettes    Quit date: 09/06/2019    Years since quitting: 1.3   Smokeless tobacco: Never  Vaping Use   Vaping Use: Never used  Substance and Sexual Activity  Alcohol use: Never   Drug use: Not Currently   Sexual activity: Not Currently    Birth control/protection: Surgical    Comment: tubal  Other Topics Concern   Not on file  Social History Narrative   Not on file   Social Determinants of Health   Financial Resource Strain: Not on file  Food Insecurity: Not on file  Transportation Needs: Not on file  Physical Activity: Not on file  Stress: Not on file  Social Connections: Not on file  Intimate Partner Violence: Not on file    FAMILY HISTORY:  Family History  Problem Relation Age of Onset   Anxiety disorder Mother    Hypertension  Mother    Heart failure Mother    Stroke Mother    Hyperlipidemia Father    Diabetes Father    Stroke Father    Hypertension Father    Colon polyps Father        older than 49   Diabetes Sister    Hypertension Sister    Ovarian cancer Sister    Asthma Daughter    Ovarian cancer Maternal Grandmother    Colon cancer Maternal Grandmother    Renal Disease Paternal Grandmother    Heart attack Paternal Grandfather    Bipolar disorder Other     CURRENT MEDICATIONS:  Current Outpatient Medications  Medication Sig Dispense Refill   albuterol (PROVENTIL HFA;VENTOLIN HFA) 108 (90 Base) MCG/ACT inhaler Inhale 1 puff into the lungs every 6 (six) hours as needed for wheezing or shortness of breath.     atorvastatin (LIPITOR) 80 MG tablet TAKE ONE TABLET AT BEDTIME 90 tablet 3   betamethasone valerate ointment (VALISONE) 0.1 % Apply 1 application topically 2 (two) times daily. Until the plaque resolves.  Then use as needed for each flareup. DO NOT APPLY TO FACE/GROIN/AXILLA 45 g 2   CALCIUM CITRATE-VITAMIN D3 PO Take 1 tablet by mouth daily.     Cariprazine HCl (VRAYLAR) 6 MG CAPS Take 1 capsule (6 mg total) by mouth daily. 90 capsule 1   Cholecalciferol (VITAMIN D3) 5000 units TABS Take 5,000 Units by mouth daily.      clonazePAM (KLONOPIN) 1 MG tablet Take 1 tablet (1 mg total) by mouth 2 (two) times daily as needed for anxiety. 60 tablet 1   Coal Tar Extract 3 % SHAM Apply 1 application topically every morning. 240 mL 1   DEXILANT 30 MG capsule TAKE (1) CAPSULE DAILY 90 capsule 3   DHS SAL 3 % SHAM Apply topically every morning.     famotidine (PEPCID) 20 MG tablet TAKE 1 TABLET DAILY 90 tablet 3   fluticasone (FLONASE) 50 MCG/ACT nasal spray SPRAY 2 SPRAYS INTO EACH NOSTRIL EVERY DAY 48 mL 0   fluticasone (FLOVENT HFA) 220 MCG/ACT inhaler Inhale 1 puff into the lungs daily.      gabapentin (NEURONTIN) 300 MG capsule Take 600 mg by mouth at bedtime.     hydrocortisone 1 % lotion Apply 1  application topically 2 (two) times daily. 118 mL 0   lamoTRIgine (LAMICTAL) 100 MG tablet Take 3 tablets (300 mg total) by mouth at bedtime. 270 tablet 1   levothyroxine (SYNTHROID) 50 MCG tablet Take 1 tablet (50 mcg total) by mouth daily. 30 tablet 11   loratadine (CLARITIN) 10 MG tablet TAKE 1 TABLET DAILY 30 tablet 11   magnesium gluconate (MAGONATE) 500 MG tablet Take 500 mg by mouth daily. (Patient not taking: Reported on 07/25/2020)     mineral  oil-hydrophilic petrolatum (AQUAPHOR) ointment Apply topically as needed for dry skin. 420 g 0   Multiple Vitamins-Minerals (MULTIVITAMIN WOMEN PO) Take 1 tablet by mouth daily.     QUEtiapine (SEROQUEL) 300 MG tablet Take 1 tablet (300 mg total) by mouth at bedtime. 90 tablet 1   varenicline (CHANTIX CONTINUING MONTH PAK) 1 MG tablet Take 1 tablet (1 mg total) by mouth 2 (two) times daily. 180 tablet 1   venlafaxine XR (EFFEXOR-XR) 150 MG 24 hr capsule Take 2 capsules (300 mg total) by mouth daily. 180 capsule 1   No current facility-administered medications for this visit.    ALLERGIES:  Allergies  Allergen Reactions   Lithium Other (See Comments)    Psoriasis     PHYSICAL EXAM:  Performance status (ECOG): 1 - Symptomatic but completely ambulatory  There were no vitals filed for this visit. Wt Readings from Last 3 Encounters:  07/18/20 196 lb 14.4 oz (89.3 kg)  07/06/20 196 lb (88.9 kg)  06/29/20 195 lb 3.2 oz (88.5 kg)   Physical Exam Vitals reviewed.  Constitutional:      Appearance: Normal appearance.  Cardiovascular:     Rate and Rhythm: Normal rate and regular rhythm.     Pulses: Normal pulses.     Heart sounds: Normal heart sounds.  Pulmonary:     Effort: Pulmonary effort is normal.     Breath sounds: Normal breath sounds.  Abdominal:     Palpations: Abdomen is soft. There is no hepatomegaly, splenomegaly or mass.     Tenderness: There is no abdominal tenderness.  Musculoskeletal:     Right lower leg: No edema.      Left lower leg: No edema.  Neurological:     General: No focal deficit present.     Mental Status: She is alert and oriented to person, place, and time.  Psychiatric:        Mood and Affect: Mood normal.        Behavior: Behavior normal.    LABORATORY DATA:  I have reviewed the labs as listed.  CBC Latest Ref Rng & Units 01/18/2021 09/05/2020 07/14/2020  WBC 4.0 - 10.5 K/uL 11.1(H) 9.4 6.5  Hemoglobin 12.0 - 15.0 g/dL 16.1(H) 15.5(H) 16.1(H)  Hematocrit 36.0 - 46.0 % 48.9(H) 47.7(H) 50.6(H)  Platelets 150 - 400 K/uL 207 221 223   CMP Latest Ref Rng & Units 01/18/2021 07/06/2020 12/24/2019  Glucose 70 - 99 mg/dL 110(H) 80 102(H)  BUN 6 - 20 mg/dL 22(H) 11 13  Creatinine 0.44 - 1.00 mg/dL 0.95 0.91 0.82  Sodium 135 - 145 mmol/L 140 142 142  Potassium 3.5 - 5.1 mmol/L 4.6 4.5 4.4  Chloride 98 - 111 mmol/L 99 100 102  CO2 22 - 32 mmol/L 28 29 30(H)  Calcium 8.9 - 10.3 mg/dL 9.6 9.5 9.2  Total Protein 6.5 - 8.1 g/dL 6.8 - 5.9(L)  Total Bilirubin 0.3 - 1.2 mg/dL 0.4 - 0.2  Alkaline Phos 38 - 126 U/L 92 - 132(H)  AST 15 - 41 U/L 26 - 15  ALT 0 - 44 U/L 32 - 23      Component Value Date/Time   RBC 4.63 01/18/2021 1351   MCV 105.6 (H) 01/18/2021 1351   MCV 99 (H) 07/06/2020 1447   MCH 34.8 (H) 01/18/2021 1351   MCHC 32.9 01/18/2021 1351   RDW 13.8 01/18/2021 1351   RDW 12.5 07/06/2020 1447   LYMPHSABS 2.0 01/18/2021 1351   LYMPHSABS 1.7 07/06/2020 1447   MONOABS  0.9 01/18/2021 1351   EOSABS 0.0 01/18/2021 1351   EOSABS 0.0 07/06/2020 1447   BASOSABS 0.1 01/18/2021 1351   BASOSABS 0.0 07/06/2020 1447    DIAGNOSTIC IMAGING:  I have independently reviewed the scans and discussed with the patient. No results found.   ASSESSMENT:  1.  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 JAK2 V617F and reflex mutation testing was negative. -Erythropoietin was 15.9. White count and platelet count was normal. -History  of chronic headaches. Smoked half pack per day for 40 years. She quit smoking when she had stroke in May of this year. -CT renal study on 09/01/2018 showed mild left hydroureteronephrosis due to 4 mm mid left ureteral calculus. Tiny left renal calculus. Severe hepatic steatosis. -Phlebotomy on 02/05/2019. -She also has sleep apnea and has not been using CPAP machine consistently.   2. Carboxyhemoglobinemia: -Carboxyhemoglobin was elevated at 18.4. ABG showed PO2 of 51 and PCO2 of 52. Elevated carboxyhemoglobin was out of proportion to her smoking.   3. 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 Right hemiparesis improved as did her speech. She continues to have some word finding difficulties. -MR angiogram of the head and neck region was done on 10/12/2019. She is on aspirin 325 mg daily.   PLAN:  1.  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 - She does not have any aquagenic pruritus or erythromelalgia's. - Physical examination does not show any palpable splenomegaly. - Reviewed labs from 01/18/2021 with hemoglobin 16.1 hematocrit 48.9.  She feels tired.  Tiredness improves after phlebotomy. - We will schedule her for phlebotomy today and in 3 months.  RTC 6 months with repeat labs.   2. Right hemiparesis and aphasia: - This completely improved at this time.  She is taking aspirin 325 mg daily.    Orders placed this encounter:  No orders of the defined types were placed in this encounter.    Derek Jack, MD [place] [phone]   I, Thana Ates, am acting as a scribe for Dr. Derek Jack.  I, Derek Jack MD, have reviewed the above documentation for accuracy and completeness, and I agree with the above.

## 2021-01-25 ENCOUNTER — Inpatient Hospital Stay (HOSPITAL_BASED_OUTPATIENT_CLINIC_OR_DEPARTMENT_OTHER): Payer: Medicare Other | Admitting: Hematology

## 2021-01-25 ENCOUNTER — Inpatient Hospital Stay (HOSPITAL_COMMUNITY): Payer: Medicare Other

## 2021-01-25 VITALS — BP 117/81 | HR 87 | Temp 98.2°F | Resp 16 | Wt 179.0 lb

## 2021-01-25 DIAGNOSIS — Z8711 Personal history of peptic ulcer disease: Secondary | ICD-10-CM | POA: Diagnosis not present

## 2021-01-25 DIAGNOSIS — D751 Secondary polycythemia: Secondary | ICD-10-CM | POA: Diagnosis not present

## 2021-01-25 DIAGNOSIS — N132 Hydronephrosis with renal and ureteral calculous obstruction: Secondary | ICD-10-CM | POA: Diagnosis not present

## 2021-01-25 DIAGNOSIS — Z87442 Personal history of urinary calculi: Secondary | ICD-10-CM | POA: Diagnosis not present

## 2021-01-25 DIAGNOSIS — G8191 Hemiplegia, unspecified affecting right dominant side: Secondary | ICD-10-CM | POA: Diagnosis not present

## 2021-01-25 DIAGNOSIS — E039 Hypothyroidism, unspecified: Secondary | ICD-10-CM | POA: Diagnosis not present

## 2021-01-25 DIAGNOSIS — K219 Gastro-esophageal reflux disease without esophagitis: Secondary | ICD-10-CM | POA: Diagnosis not present

## 2021-01-25 DIAGNOSIS — F17210 Nicotine dependence, cigarettes, uncomplicated: Secondary | ICD-10-CM | POA: Diagnosis not present

## 2021-01-25 DIAGNOSIS — G4730 Sleep apnea, unspecified: Secondary | ICD-10-CM | POA: Diagnosis not present

## 2021-01-25 DIAGNOSIS — Z7982 Long term (current) use of aspirin: Secondary | ICD-10-CM | POA: Diagnosis not present

## 2021-01-25 DIAGNOSIS — R4701 Aphasia: Secondary | ICD-10-CM | POA: Diagnosis not present

## 2021-01-25 DIAGNOSIS — Z8673 Personal history of transient ischemic attack (TIA), and cerebral infarction without residual deficits: Secondary | ICD-10-CM | POA: Diagnosis not present

## 2021-01-25 DIAGNOSIS — Z79899 Other long term (current) drug therapy: Secondary | ICD-10-CM | POA: Diagnosis not present

## 2021-01-25 DIAGNOSIS — K5900 Constipation, unspecified: Secondary | ICD-10-CM | POA: Diagnosis not present

## 2021-01-25 DIAGNOSIS — E785 Hyperlipidemia, unspecified: Secondary | ICD-10-CM | POA: Diagnosis not present

## 2021-01-25 DIAGNOSIS — M791 Myalgia, unspecified site: Secondary | ICD-10-CM | POA: Diagnosis not present

## 2021-01-25 DIAGNOSIS — K760 Fatty (change of) liver, not elsewhere classified: Secondary | ICD-10-CM | POA: Diagnosis not present

## 2021-01-25 NOTE — Patient Instructions (Signed)
[facility]  Discharge Instructions: Thank you for choosing [facility] to provide your oncology and hematology care.  If you have a lab appointment with the [facility], please come in thru the Main Entrance and check in at the main information desk.  Wear comfortable clothing and clothing appropriate for easy access to any Portacath or PICC line.   We strive to give you quality time with your provider. You may need to reschedule your appointment if you arrive late (15 or more minutes).  Arriving late affects you and other patients whose appointments are after yours.  Also, if you miss three or more appointments without notifying the office, you may be dismissed from the clinic at the provider's discretion.      For prescription refill requests, have your pharmacy contact our office and allow 72 hours for refills to be completed.    Today you received the following:Phlebotomy.      To help prevent nausea and vomiting after your treatment, we encourage you to take your nausea medication as directed.  BELOW ARE SYMPTOMS THAT SHOULD BE REPORTED IMMEDIATELY: *FEVER GREATER THAN 100.4 F (38 C) OR HIGHER *CHILLS OR SWEATING *NAUSEA AND VOMITING THAT IS NOT CONTROLLED WITH YOUR NAUSEA MEDICATION *UNUSUAL SHORTNESS OF BREATH *UNUSUAL BRUISING OR BLEEDING *URINARY PROBLEMS (pain or burning when urinating, or frequent urination) *BOWEL PROBLEMS (unusual diarrhea, constipation, pain near the anus) TENDERNESS IN MOUTH AND THROAT WITH OR WITHOUT PRESENCE OF ULCERS (sore throat, sores in mouth, or a toothache) UNUSUAL RASH, SWELLING OR PAIN  UNUSUAL VAGINAL DISCHARGE OR ITCHING   Items with * indicate a potential emergency and should be followed up as soon as possible or go to the Emergency Department if any problems should occur.  Please show the CHEMOTHERAPY ALERT CARD or IMMUNOTHERAPY ALERT CARD at check-in to the Emergency Department and triage nurse.  Should you  have questions after your visit or need to cancel or reschedule your appointment, please contact [facility] [phone]  and follow the prompts.  Office hours are 8:00 a.m. to 4:30 p.m. Monday - Friday. Please note that voicemails left after 4:00 p.m. may not be returned until the following business day.  We are closed weekends and major holidays. You have access to a nurse at all times for urgent questions. Please call the main number to the clinic [phone] and follow the prompts.  For any non-urgent questions, you may also contact your provider using MyChart. We now offer e-Visits for anyone 44 and older to request care online for non-urgent symptoms. For details visit [email or web address].   Also download the MyChart app! Go to the app store, search "MyChart", open the app, select [facility], and log in with your MyChart username and password.  Due to Covid, a mask is required upon entering the hospital/clinic. If you do not have a mask, one will be given to you upon arrival. For doctor visits, patients may have 1 support person aged 60 or older with them. For treatment visits, patients cannot have anyone with them due to current Covid guidelines and our immunocompromised population.

## 2021-01-25 NOTE — Progress Notes (Signed)
Rebecca Moreno presents for therapeutic phlebotomy per MD orders. Last HGB 11.1/ HCT 48.9 on 01/18/21 . Vital signs stable prior to procedure. Procedure started at 14:20 pm and ended at 14:47 pm. 500 mls of blood removed. Patient denies any dizziness , lightheadedness, or feeling faint.  Gauze and coban applied to site. Vital signs stable at completion of procedure. Patient has no complaints at this time. Alert and oriented x3. Patient monitored for 30 minutes post phlebotomy.   Phlebotomy performed  today per MD orders. Tolerated without adverse affects. Vital signs stable. No complaints at this time. Discharged from clinic ambulatory in stable condition. Alert and oriented x 3. F/U with [facility] as scheduled.

## 2021-01-25 NOTE — Patient Instructions (Addendum)
[place] at [facility] Discharge Instructions  You were seen today by Dr. Delton Coombes. He went over your recent results. You will be scheduled for a phlebotomy as soon as is possible as well as another phlebotomy in 3 months. Dr. Delton Coombes will see you back in 6 months for labs and follow up.   Thank you for choosing [facility] to provide your oncology and hematology care.  To afford each patient quality time with our provider, please arrive at least 15 minutes before your scheduled appointment time.   If you have a lab appointment with the [facility] please come in thru the Main Entrance and check in at the main information desk  You need to re-schedule your appointment should you arrive 10 or more minutes late.  We strive to give you quality time with our providers, and arriving late affects you and other patients whose appointments are after yours.  Also, if you no show three or more times for appointments you may be dismissed from the clinic at the providers discretion.     Again, thank you for choosing [facility]  Our hope is that these requests will decrease the amount of time that you wait before being seen by our physicians.       _____________________________________________________________  Should you have questions after your visit to [facility], please contact our office at [phone] between the hours of 8:00 a.m. and 4:30 p.m.  Voicemails left after 4:00 p.m. will not be returned until the following business day.  For prescription refill requests, have your pharmacy contact our office and allow 72 hours.    Cancer Center Support Programs:   > Cancer Support Group  2nd Tuesday of the month 1pm-2pm, Journey Room

## 2021-01-31 ENCOUNTER — Other Ambulatory Visit: Payer: Self-pay | Admitting: Family Medicine

## 2021-02-07 NOTE — Progress Notes (Signed)
Virtual Visit via Telephone Note  I connected with Rebecca Moreno on 02/08/21 at  1:20 PM EDT by telephone and verified that I am speaking with the correct person using two identifiers.  Location: Patient: car Provider: office Persons participated in the visit- patient, provider    I discussed the limitations, risks, security and privacy concerns of performing an evaluation and management service by telephone and the availability of in person appointments. I also discussed with the patient that there may be a patient responsible charge related to this service. The patient expressed understanding and agreed to proceed.    I discussed the assessment and treatment plan with the patient. The patient was provided an opportunity to ask questions and all were answered. The patient agreed with the plan and demonstrated an understanding of the instructions.   The patient was advised to call back or seek an in-person evaluation if the symptoms worsen or if the condition fails to improve as anticipated.  I provided 11 minutes of non-face-to-face time during this encounter.   Norman Clay, MD    [facility] MD/PA/NP OP Progress Note  02/08/2021 1:53 PM Rebecca Moreno  MRN:  [id number]  Chief Complaint:  Chief Complaint   Follow-up; Depression    HPI:  This is a follow-up appointment for depression and an anxiety.  She states that she has been feeling okay today.  She is out, and she has been able to go outside several times during the day, although she does not think it is so many.  Her mother-in-law had a fall, although she has been doing good now.  Precious Bard tends to be aggravated quickly.  She will go to her bedlam when it happens.  She enjoys ball game, and is planning to meet with her great nephew.  She reports fair relationship with her husband.  She has depressive symptoms as in PHQ-9.  She denies SI.  She takes clonazepam 5 times a week for anxiety.  She does not want to change her medication, and  would like to stay the same.    Daily routine: visits her daughter, who lives 15 miles away, goes to gym 3 times per week with her daughter Employment: on disability after TMJ surgery in 2007. 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 Her parents had marital discordance and the patient screamed when she was a child as she did not want to hear them. She reports good relationship with both of her parents otherwise. She has one sister, who she has good relationship with  Visit Diagnosis:    ICD-10-CM   1. MDD (major depressive disorder), recurrent episode, mild ([place])  F33.0       Past Psychiatric History: Please see initial evaluation for full details. I have reviewed the history. No updates at this time.     Past Medical History:  Past Medical History:  Diagnosis Date   Acute pyelonephritis    Asthma    Bipolar disorder ([place])    Colon polyp    Complication of anesthesia    "sometimes hard to put to sleep"   Depression    Bipolar   Gastric ulcer    around 2013.  stress related   GERD (gastroesophageal reflux disease)    History of kidney stones    Hyperlipidemia    Hypothyroid    Polyp, uterus corpus    Sepsis  due to Escherichia coli (E. coli) ([place]) 09/04/2018   Sleep apnea    no cpap used   Stroke [facility])     Past Surgical History:  Procedure Laterality Date   ABLATION ON ENDOMETRIOSIS     COLONOSCOPY     Per patient, done around 2013 in [place], had polyp and overdue for follow up.   COLONOSCOPY WITH PROPOFOL N/A 05/12/2018   Procedure: COLONOSCOPY WITH PROPOFOL;  Surgeon: Daneil Dolin, MD;  Location: [facility] END[facility] SU[facility];  Service: Endoscopy;  Laterality: N/A;  12:00pm   CYSTOSCOPY W/ URETERAL STENT PLACEMENT Left 09/01/2018   Procedure: CYSTOSCOPY WITH RETROGRADE PYELOGRAM/URETERAL STENT PLACEMENT;  Surgeon: Ceasar Mons, MD;  Location: [facility]  ORS;  Service: Urology;  Laterality: Left;   CYSTOSCOPY/URETEROSCOPY/HOLMIUM LASER/STENT PLACEMENT Left 09/17/2018   Procedure: CYSTOSCOPY/URETEROSCOPY/HOLMIUM LASER/STENT PLACEMENT;  Surgeon: Ceasar Mons, MD;  Location: [facility] ORS;  Service: Urology;  Laterality: Left;  ONLY NEEDS 45 MIN   OVARIAN CYST SURGERY Left    POLYPECTOMY  05/12/2018   Procedure: POLYPECTOMY;  Surgeon: Daneil Dolin, MD;  Location: [facility];  Service: Endoscopy;;  polyp at splenic flexure   TEMPOROMANDIBULAR JOINT SURGERY     9 surgeries   TUBAL LIGATION     vaginal childbirth     1    Family Psychiatric History: Please see initial evaluation for full details. I have reviewed the history. No updates at this time.     Family History:  Family History  Problem Relation Age of Onset   Anxiety disorder Mother    Hypertension Mother    Heart failure Mother    Stroke Mother    Hyperlipidemia Father    Diabetes Father    Stroke Father    Hypertension Father    Colon polyps Father        older than 34   Diabetes Sister    Hypertension Sister    Ovarian cancer Sister    Asthma Daughter    Ovarian cancer Maternal Grandmother    Colon cancer Maternal Grandmother    Renal Disease Paternal Grandmother    Heart attack Paternal Grandfather    Bipolar disorder Other     Social History:  Social History   Socioeconomic History   Marital status: Married    Spouse name: Not on file   Number of children: 1   Years of education: Not on file   Highest education level: High school graduate  Occupational History   Occupation: disability  Tobacco Use   Smoking status: Former    Packs/day: 0.50    Years: 30.00    Pack years: 15.00    Types: Cigarettes    Quit date: 09/06/2019    Years since quitting: 1.4   Smokeless tobacco: Never  Vaping Use   Vaping Use: Never used  Substance and Sexual Activity   Alcohol use: Never   Drug use: Not Currently   Sexual activity: Not Currently    Birth  control/protection: Surgical    Comment: tubal  Other Topics Concern   Not on file  Social History Narrative   Not on file   Social Determinants of Health   Financial Resource Strain: Not on file  Food Insecurity: Not on file  Transportation Needs: Not on file  Physical Activity: Not on file  Stress: Not on file  Social Connections: Not on file    Allergies:  Allergies  Allergen Reactions   Lithium Other (See Comments)    Psoriasis  Metabolic Disorder Labs: No results found for: HGBA1C, MPG No results found for: PROLACTIN Lab Results  Component Value Date   CHOL 212 (H) 12/18/2018   TRIG 171 (H) 12/18/2018   HDL 44 12/18/2018   CHOLHDL 4.8 (H) 12/18/2018   LDLCALC 134 (H) 12/18/2018   LDLCALC 168 (H) 01/15/2018   Lab Results  Component Value Date   TSH 0.912 07/06/2020   TSH 0.718 12/24/2019    Therapeutic Level Labs: No results found for: LITHIUM No results found for: VALPROATE No components found for:  CBMZ  Current Medications: Current Outpatient Medications  Medication Sig Dispense Refill   albuterol (PROVENTIL HFA;VENTOLIN HFA) 108 (90 Base) MCG/ACT inhaler Inhale 1 puff into the lungs every 6 (six) hours as needed for wheezing or shortness of breath. (Patient not taking: Reported on 01/25/2021)     atorvastatin (LIPITOR) 80 MG tablet TAKE ONE TABLET AT BEDTIME 90 tablet 3   CALCIUM CITRATE-VITAMIN D3 PO Take 1 tablet by mouth daily.     Cariprazine HCl (VRAYLAR) 6 MG CAPS Take 1 capsule (6 mg total) by mouth daily. 90 capsule 1   Cholecalciferol (VITAMIN D3) 5000 units TABS Take 5,000 Units by mouth daily.      [START ON 02/15/2021] clonazePAM (KLONOPIN) 1 MG tablet Take 1 tablet (1 mg total) by mouth 2 (two) times daily as needed for anxiety. 60 tablet 2   DEXILANT 30 MG capsule TAKE (1) CAPSULE DAILY 90 capsule 3   DHS SAL 3 % SHAM Apply topically every morning.     famotidine (PEPCID) 20 MG tablet TAKE 1 TABLET DAILY 90 tablet 3   fluticasone  (FLONASE) 50 MCG/ACT nasal spray SPRAY 2 SPRAYS INTO EACH NOSTRIL EVERY DAY 48 mL 0   fluticasone (FLOVENT HFA) 220 MCG/ACT inhaler Inhale 1 puff into the lungs daily.      gabapentin (NEURONTIN) 300 MG capsule Take 600 mg by mouth at bedtime.     [START ON 02/15/2021] lamoTRIgine (LAMICTAL) 100 MG tablet Take 3 tablets (300 mg total) by mouth at bedtime. 270 tablet 1   levothyroxine (SYNTHROID) 50 MCG tablet TAKE 1 TABLET DAILY 90 tablet 1   loratadine (CLARITIN) 10 MG tablet TAKE 1 TABLET DAILY 30 tablet 11   magnesium gluconate (MAGONATE) 500 MG tablet Take 500 mg by mouth daily.     mineral oil-hydrophilic petrolatum (AQUAPHOR) ointment Apply topically as needed for dry skin. 420 g 0   Multiple Vitamins-Minerals (MULTIVITAMIN WOMEN PO) Take 1 tablet by mouth daily.     [START ON 02/15/2021] QUEtiapine (SEROQUEL) 300 MG tablet Take 1 tablet (300 mg total) by mouth at bedtime. 90 tablet 1   varenicline (CHANTIX CONTINUING MONTH PAK) 1 MG tablet Take 1 tablet (1 mg total) by mouth 2 (two) times daily. 180 tablet 1   [START ON 02/15/2021] venlafaxine XR (EFFEXOR-XR) 150 MG 24 hr capsule Take 2 capsules (300 mg total) by mouth daily. 180 capsule 1   No current facility-administered medications for this visit.     Musculoskeletal: Strength & Muscle Tone:  N/A Gait & Station:  N/A Patient leans: N/A  Psychiatric Specialty Exam: Review of Systems  Psychiatric/Behavioral:  Positive for decreased concentration and dysphoric mood. Negative for agitation, behavioral problems, confusion, hallucinations, self-injury, sleep disturbance and suicidal ideas. The patient is nervous/anxious. The patient is not hyperactive.   All other systems reviewed and are negative.  Last menstrual period 12/13/2017.There is no height or weight on file to calculate BMI.  General Appearance: NA  Eye Contact:  NA  Speech:  Clear and Coherent  Volume:  Normal  Mood:   good  Affect:  NA  Thought Process:  Coherent   Orientation:  Full (Time, Place, and Person)  Thought Content: Logical   Suicidal Thoughts:  No  Homicidal Thoughts:  No  Memory:  Immediate;   Good  Judgement:  Good  Insight:  Fair  Psychomotor Activity:  Normal  Concentration:  Concentration: Good and Attention Span: Good  Recall:  Good  Fund of Knowledge: Good  Language: Good  Akathisia:  No  Handed:  Right  AIMS (if indicated): not done  Assets:  Communication Skills Desire for Improvement  ADL's:  Intact  Cognition: WNL  Sleep:  Good   Screenings: GAD-7    Flowsheet Row Office Visit from 06/29/2020 in [place] Office Visit from 08/19/2019 in [place] Visit from 04/16/2018 in [place]  Total GAD-7 Score 10 11 3       Mini-Mental    [place] from 07/22/2018 in [place]  Total Score (max 30 points ) 30      PHQ2-9    Flowsheet Row Video Visit from 02/08/2021 in [place] Video Visit from 08/18/2020 in [place] Office Visit from 07/06/2020 in [place] Visit from 06/29/2020 in [place] Visit from 06/01/2020 in [place]  PHQ-2 Total Score 3 2 1 2 3   PHQ-9 Total Score 9 4 5 4 12       Flowsheet Row Video Visit from 11/15/2020 in [place] Video Visit from 08/18/2020 in [place] No Risk No Risk        Assessment and Plan:  DAIZEE FIRMIN is a 55 y.o. year old female with a history of depression, hypothyroidism, who presents for follow up appointment for below.   1. MDD (major depressive disorder), recurrent episode, mild ([place]) # Bipolar I disorder by history Although she continues to report depressive symptoms and anxiety, she has been handling things  relatively well since her last visit.  Psychosocial stressors includes taking care of her mother-in-law, marital conflict.  She has strong preference to stay on the current medication (which was originally prescribed by other provider) despite being provided psychoeducation regarding polypharmacy.  Will continue venlafaxine to target depression.  Will continue lamotrigine, Vraylar, and quetiapine to target mood dysregulation.  Will continue clonazepam as needed for anxiety. Noted that although the patient was reportedly diagnosed with bipolar I disorder in the past, both the patient and her husband denies any manic/hypomanic episode except irritability, paranoia in the context of depression.       This clinician has discussed the side effect associated with medication prescribed during this encounter. Please refer to notes in the previous encounters for more details.    Plan I have reviewed and updated plans as below  1. Continue Venlafaxine 300 mg daily 2. Continue Lamotrigine 300 mg daily 3. Continue Vraylar 6 mg 4. Continue Quetiapine 300 mg at night 5. Continue Clonazepam 1 mg twice a day as needed for anxiety  6. Next appointment: 1/4 at 1 PM, phone. She declined in person visit due to location - She has an upcoming appointment with her PCP in Oct. She was advised to obtain lipid panels/glucose at the next visit    This clinician has discussed the side effect  associated with medication prescribed during this encounter. Please refer to notes in the previous encounters for more details.       Past trials of medication: sertraline, fluoxetine, lexapro, Celexa, duloxetine, Wellbutrin  lithium (psoriasis), Depakote (did not work), olanzapine, Abilify (weight gain), latuda, Geodon    The patient demonstrates the following risk factors for suicide: Chronic risk factors for suicide include: psychiatric disorder of depression. Acute risk factors for suicide include: family or marital conflict and  unemployment. Protective factors for this patient include: positive social support and hope for the future. Considering these factors, the overall suicide risk at this point appears to be low. Patient is appropriate for outpatient follow up.    Norman Clay, MD 02/08/2021, 1:53 PM

## 2021-02-08 ENCOUNTER — Telehealth (INDEPENDENT_AMBULATORY_CARE_PROVIDER_SITE_OTHER): Payer: Medicare Other | Admitting: Psychiatry

## 2021-02-08 ENCOUNTER — Other Ambulatory Visit: Payer: Self-pay

## 2021-02-08 ENCOUNTER — Encounter: Payer: Self-pay | Admitting: Psychiatry

## 2021-02-08 DIAGNOSIS — F330 Major depressive disorder, recurrent, mild: Secondary | ICD-10-CM | POA: Diagnosis not present

## 2021-02-08 MED ORDER — LAMOTRIGINE 100 MG PO TABS: 300.0000 mg | ORAL_TABLET | Freq: Every day | ORAL | 1 refills | Status: DC

## 2021-02-08 MED ORDER — CLONAZEPAM 1 MG PO TABS: 1.0000 mg | ORAL_TABLET | Freq: Two times a day (BID) | ORAL | 2 refills | Status: DC | PRN

## 2021-02-08 MED ORDER — VRAYLAR 6 MG PO CAPS: 1.0000 | ORAL_CAPSULE | Freq: Every day | ORAL | 1 refills | Status: DC

## 2021-02-08 MED ORDER — VENLAFAXINE HCL ER 150 MG PO CP24: 300.0000 mg | ORAL_CAPSULE | Freq: Every day | ORAL | 1 refills | Status: DC

## 2021-02-08 MED ORDER — QUETIAPINE FUMARATE 300 MG PO TABS: 300.0000 mg | ORAL_TABLET | Freq: Every day | ORAL | 1 refills | Status: DC

## 2021-02-08 NOTE — Patient Instructions (Signed)
1. Continue Venlafaxine 300 mg daily 2. Continue Lamotrigine 300 mg daily 3. Continue Vraylar 6 mg 4. Continue Quetiapine 300 mg at night 5. Continue Clonazepam 1 mg twice a day as needed for anxiety  6. Next appointment: 1/4 at 1 PM

## 2021-02-09 DIAGNOSIS — J449 Chronic obstructive pulmonary disease, unspecified: Secondary | ICD-10-CM | POA: Diagnosis not present

## 2021-02-09 DIAGNOSIS — R0902 Hypoxemia: Secondary | ICD-10-CM | POA: Diagnosis not present

## 2021-02-09 DIAGNOSIS — G4733 Obstructive sleep apnea (adult) (pediatric): Secondary | ICD-10-CM | POA: Diagnosis not present

## 2021-02-17 ENCOUNTER — Other Ambulatory Visit: Payer: Self-pay | Admitting: Family Medicine

## 2021-02-20 ENCOUNTER — Ambulatory Visit (INDEPENDENT_AMBULATORY_CARE_PROVIDER_SITE_OTHER): Payer: Medicare Other | Admitting: Family Medicine

## 2021-02-20 ENCOUNTER — Encounter: Payer: Self-pay | Admitting: Family Medicine

## 2021-02-20 ENCOUNTER — Other Ambulatory Visit: Payer: Self-pay

## 2021-02-20 VITALS — BP 113/78 | HR 89 | Temp 98.4°F | Resp 20 | Ht 66.0 in | Wt 182.0 lb

## 2021-02-20 DIAGNOSIS — G8929 Other chronic pain: Secondary | ICD-10-CM | POA: Diagnosis not present

## 2021-02-20 DIAGNOSIS — J4530 Mild persistent asthma, uncomplicated: Secondary | ICD-10-CM | POA: Diagnosis not present

## 2021-02-20 DIAGNOSIS — R739 Hyperglycemia, unspecified: Secondary | ICD-10-CM

## 2021-02-20 DIAGNOSIS — M25551 Pain in right hip: Secondary | ICD-10-CM | POA: Diagnosis not present

## 2021-02-20 DIAGNOSIS — Z5181 Encounter for therapeutic drug level monitoring: Secondary | ICD-10-CM

## 2021-02-20 DIAGNOSIS — Z0000 Encounter for general adult medical examination without abnormal findings: Secondary | ICD-10-CM

## 2021-02-20 DIAGNOSIS — E039 Hypothyroidism, unspecified: Secondary | ICD-10-CM

## 2021-02-20 DIAGNOSIS — S025XXB Fracture of tooth (traumatic), initial encounter for open fracture: Secondary | ICD-10-CM

## 2021-02-20 DIAGNOSIS — Z0001 Encounter for general adult medical examination with abnormal findings: Secondary | ICD-10-CM | POA: Diagnosis not present

## 2021-02-20 DIAGNOSIS — F0630 Mood disorder due to known physiological condition, unspecified: Secondary | ICD-10-CM | POA: Diagnosis not present

## 2021-02-20 MED ORDER — ALBUTEROL SULFATE HFA 108 (90 BASE) MCG/ACT IN AERS: 1.0000 | INHALATION_SPRAY | Freq: Four times a day (QID) | RESPIRATORY_TRACT | 1 refills | Status: DC | PRN

## 2021-02-20 MED ORDER — FLUTICASONE PROPIONATE HFA 220 MCG/ACT IN AERO: 2.0000 | INHALATION_SPRAY | Freq: Two times a day (BID) | RESPIRATORY_TRACT | 12 refills | Status: DC

## 2021-02-20 NOTE — Progress Notes (Signed)
Rebecca Moreno is a 55 y.o. female presents to office today for annual physical exam examination.    Concerns today include: 1.  Right-sided hip pain Patient reports ongoing right-sided hip pain.  She points to the SI and lumbosacral region of the right side of the back and hip.  She does not report any radiation to the groin.  She denies any weakness but sometimes feels like the pain is so bad that she limps.  Is difficult to lay on that side due to hip pain.  No reports of sensory changes  2.  Uncomplicated asthma Patient reports interval use of Flovent and albuterol.  She denies any recent exacerbation of asthma.  Diet: Fair, Exercise: No structured reported Last eye exam: Up-to-date Last dental exam: Has appointment coming up for broken tooth Last colonoscopy: Up-to-date Last mammogram: Up-to-date Last pap smear: Up-to-date Refills needed today: Inhalers Immunizations needed: Immunization History  Administered Date(s) Administered   Influenza Inj Mdck Quad Pf 02/03/2018   Influenza,inj,Quad PF,6+ Mos 05/27/2019, 02/29/2020   Moderna Sars-Covid-2 Vaccination 07/27/2019, 08/24/2019, 04/22/2020, 09/27/2020   Pneumococcal Conjugate-13 01/06/2016   Pneumococcal Polysaccharide-23 02/13/2018   Tdap 01/06/2016     Past Medical History:  Diagnosis Date   Acute pyelonephritis    Asthma    Bipolar disorder ([place])    Colon polyp    Complication of anesthesia    "sometimes hard to put to sleep"   Depression    Bipolar   Gastric ulcer    around 2013.  stress related   GERD (gastroesophageal reflux disease)    History of kidney stones    Hyperlipidemia    Hypothyroid    Polyp, uterus corpus    Sepsis due to Escherichia coli (E. coli) ([place]) 09/04/2018   Sleep apnea    no cpap used   Stroke [facility])    Social History   Socioeconomic History   Marital status: Married    Spouse name: Not on file   Number of children: 1   Years of education: Not on file   Highest education  level: High school graduate  Occupational History   Occupation: disability  Tobacco Use   Smoking status: Former    Packs/day: 0.50    Years: 30.00    Pack years: 15.00    Types: Cigarettes    Quit date: 09/06/2019    Years since quitting: 1.4   Smokeless tobacco: Never  Vaping Use   Vaping Use: Never used  Substance and Sexual Activity   Alcohol use: Never   Drug use: Not Currently   Sexual activity: Not Currently    Birth control/protection: Surgical    Comment: tubal  Other Topics Concern   Not on file  Social History Narrative   Not on file   Social Determinants of Health   Financial Resource Strain: Not on file  Food Insecurity: Not on file  Transportation Needs: Not on file  Physical Activity: Not on file  Stress: Not on file  Social Connections: Not on file  Intimate Partner Violence: Not on file   Past Surgical History:  Procedure Laterality Date   ABLATION ON ENDOMETRIOSIS     COLONOSCOPY     Per patient, done around 2013 in [place], had polyp and overdue for follow up.   COLONOSCOPY WITH PROPOFOL N/A 05/12/2018   Procedure: COLONOSCOPY WITH PROPOFOL;  Surgeon: Daneil Dolin, MD;  [facility];  Service: Endoscopy;  Laterality: N/A;  12:00pm   CYSTOSCOPY W/ URETERAL STENT  PLACEMENT Left 09/01/2018   Procedure: CYSTOSCOPY WITH RETROGRADE PYELOGRAM/URETERAL STENT PLACEMENT;  Surgeon: Ceasar Mons, MD;  Location: [facility];  Service: Urology;  Laterality: Left;   CYSTOSCOPY/URETEROSCOPY/HOLMIUM LASER/STENT PLACEMENT Left 09/17/2018   Procedure: CYSTOSCOPY/URETEROSCOPY/HOLMIUM LASER/STENT PLACEMENT;  Surgeon: Ceasar Mons, MD;  Location: [facility];  Service: Urology;  Laterality: Left;  ONLY NEEDS 45 MIN   OVARIAN CYST SURGERY Left    POLYPECTOMY  05/12/2018   Procedure: POLYPECTOMY;  Surgeon: Daneil Dolin, MD;  [facility];  Service: Endoscopy;;  polyp at splenic flexure   TEMPOROMANDIBULAR JOINT SURGERY     9  surgeries   TUBAL LIGATION     vaginal childbirth     1   Family History  Problem Relation Age of Onset   Anxiety disorder Mother    Hypertension Mother    Heart failure Mother    Stroke Mother    Hyperlipidemia Father    Diabetes Father    Stroke Father    Hypertension Father    Colon polyps Father        older than 50   Diabetes Sister    Hypertension Sister    Ovarian cancer Sister    Asthma Daughter    Ovarian cancer Maternal Grandmother    Colon cancer Maternal Grandmother    Renal Disease Paternal Grandmother    Heart attack Paternal Grandfather    Bipolar disorder Other     Current Outpatient Medications:    albuterol (PROVENTIL HFA;VENTOLIN HFA) 108 (90 Base) MCG/ACT inhaler, Inhale 1 puff into the lungs every 6 (six) hours as needed for wheezing or shortness of breath. (Patient not taking: Reported on 01/25/2021), Disp: , Rfl:    atorvastatin (LIPITOR) 80 MG tablet, TAKE ONE TABLET AT BEDTIME, Disp: 90 tablet, Rfl: 3   CALCIUM CITRATE-VITAMIN D3 PO, Take 1 tablet by mouth daily., Disp: , Rfl:    Cariprazine HCl (VRAYLAR) 6 MG CAPS, Take 1 capsule (6 mg total) by mouth daily., Disp: 90 capsule, Rfl: 1   Cholecalciferol (VITAMIN D3) 5000 units TABS, Take 5,000 Units by mouth daily. , Disp: , Rfl:    clonazePAM (KLONOPIN) 1 MG tablet, Take 1 tablet (1 mg total) by mouth 2 (two) times daily as needed for anxiety., Disp: 60 tablet, Rfl: 2   DEXILANT 30 MG capsule, TAKE (1) CAPSULE DAILY, Disp: 90 capsule, Rfl: 3   DHS SAL 3 % SHAM, Apply topically every morning., Disp: , Rfl:    famotidine (PEPCID) 20 MG tablet, TAKE 1 TABLET DAILY, Disp: 90 tablet, Rfl: 0   fluticasone (FLONASE) 50 MCG/ACT nasal spray, SPRAY 2 SPRAYS INTO EACH NOSTRIL EVERY DAY, Disp: 48 mL, Rfl: 0   fluticasone (FLOVENT HFA) 220 MCG/ACT inhaler, Inhale 1 puff into the lungs daily. , Disp: , Rfl:    gabapentin (NEURONTIN) 300 MG capsule, Take 600 mg by mouth at bedtime., Disp: , Rfl:    lamoTRIgine  (LAMICTAL) 100 MG tablet, Take 3 tablets (300 mg total) by mouth at bedtime., Disp: 270 tablet, Rfl: 1   levothyroxine (SYNTHROID) 50 MCG tablet, TAKE 1 TABLET DAILY, Disp: 90 tablet, Rfl: 1   loratadine (CLARITIN) 10 MG tablet, TAKE 1 TABLET DAILY, Disp: 30 tablet, Rfl: 11   magnesium gluconate (MAGONATE) 500 MG tablet, Take 500 mg by mouth daily., Disp: , Rfl:    mineral oil-hydrophilic petrolatum (AQUAPHOR) ointment, Apply topically as needed for dry skin., Disp: 420 g, Rfl: 0   Multiple Vitamins-Minerals (MULTIVITAMIN WOMEN PO), Take 1  tablet by mouth daily., Disp: , Rfl:    QUEtiapine (SEROQUEL) 300 MG tablet, Take 1 tablet (300 mg total) by mouth at bedtime., Disp: 90 tablet, Rfl: 1   varenicline (CHANTIX CONTINUING MONTH PAK) 1 MG tablet, Take 1 tablet (1 mg total) by mouth 2 (two) times daily., Disp: 180 tablet, Rfl: 1   venlafaxine XR (EFFEXOR-XR) 150 MG 24 hr capsule, Take 2 capsules (300 mg total) by mouth daily., Disp: 180 capsule, Rfl: 1  Allergies  Allergen Reactions   Lithium Other (See Comments)    Psoriasis      ROS: Review of Systems Pertinent items noted in HPI and remainder of comprehensive ROS otherwise negative.    Physical exam BP 113/78   Pulse 89   Temp 98.4 F (36.9 C)   Resp 20   Ht 5\' 6"  (1.676 m)   Wt 182 lb (82.6 kg)   LMP 12/13/2017 (Approximate)   SpO2 95%   BMI 29.38 kg/m  General appearance: alert, cooperative, appears stated age, and no distress Head: Normocephalic, without obvious abnormality, atraumatic Eyes: negative findings: lids and lashes normal, conjunctivae and sclerae normal, corneas clear, and pupils equal, round, reactive to light and accomodation Ears: normal TM's and external ear canals both ears Nose: Nares normal. Septum midline. Mucosa normal. No drainage or sinus tenderness. Throat:  Broken tooth on the right lower aspect of the mouth.  Only part of a crown remaining.  Oropharynx without masses or erythema Neck: no adenopathy,  no carotid bruit, supple, symmetrical, trachea midline, and thyroid not enlarged, symmetric, no tenderness/mass/nodules Back: symmetric, no curvature. ROM normal. No CVA tenderness. Lungs: clear to auscultation bilaterally Heart: regular rate and rhythm, S1, S2 normal, no murmur, click, rub or gallop Abdomen: soft, non-tender; bowel sounds normal; no masses,  no organomegaly Extremities: extremities normal, atraumatic, no cyanosis or edema Pulses: 2+ and symmetric Skin: Skin color, texture, turgor normal. No rashes or lesions Lymph nodes: Cervical, supraclavicular, and axillary nodes normal. Neurologic: Grossly normal MSK: Ambulating independently.  No visible limp with ambulation. Psych: Very pleasant, interactive female. Depression screen [facility] 2/9 02/20/2021 07/06/2020 06/29/2020  Decreased Interest 2 0 1  Down, Depressed, Hopeless 2 1 1   PHQ - 2 Score 4 1 2   Altered sleeping 2 1 1   Tired, decreased energy 1 1 1   Change in appetite 0 0 0  Feeling bad or failure about yourself  0 1 0  Trouble concentrating 2 1 0  Moving slowly or fidgety/restless 0 0 0  Suicidal thoughts 0 0 0  PHQ-9 Score 9 5 4   Difficult doing work/chores Extremely dIfficult Somewhat difficult Somewhat difficult  Some encounter information is confidential and restricted. Go to Review Flowsheets activity to see all data.  Some recent data might be hidden   GAD 7 : Generalized Anxiety Score 02/20/2021 06/29/2020 08/19/2019 04/16/2018  Nervous, Anxious, on Edge 1 3 3 1   Control/stop worrying 1 2 3  0  Worry too much - different things 1 1 1  0  Trouble relaxing 1 1 3 1   Restless 1 2 0 0  Easily annoyed or irritable 1 1 1 1   Afraid - awful might happen 0 0 0 0  Total GAD 7 Score 6 10 11 3   Anxiety Difficulty Somewhat difficult Somewhat difficult Not difficult at all Not difficult at all    Assessment/ Plan: Rebecca Moreno here for annual physical exam.   Annual physical exam  Elevated serum glucose - Plan: Bayer  DCA Hb A1c Waived  Acquired hypothyroidism - Plan: TSH, T4, Free  Mild persistent asthma without complication - Plan: albuterol (VENTOLIN HFA) 108 (90 Base) MCG/ACT inhaler, fluticasone (FLOVENT HFA) 220 MCG/ACT inhaler  Mood disorder in conditions classified elsewhere - Plan: EKG 12-Lead  Medication monitoring encounter - Plan: EKG 12-Lead  Open fracture of tooth, initial encounter  Chronic right hip pain - Plan: Ambulatory referral to Orthopedic Surgery  Declined influenza vaccination sudden that she already had it done  Blood sugar noted to be elevated on last laboratory draw.  A1c checked  Asymptomatic from a thyroid standpoint.  Labs ordered  Asthma is stable with as needed use of inhalers.  These have been renewed  EKG obtained given use of Seroquel.  QTC was appropriate.  She had some nonspecific T wave abnormalities but I have no previous for comparison.  Continue statin, blood pressure control  Has appointment with dentistry for the broken tooth  Referral to orthopedics for chronic right-sided hip pain that may be of lumbar etiology given history of degenerative changes noted previously on MRI  Counseled on healthy lifestyle choices, including diet (rich in fruits, vegetables and lean meats and low in salt and simple carbohydrates) and exercise (at least 30 minutes of moderate physical activity daily).  Patient to follow up in 1 year for annual exam or sooner if needed.  Rukiya Hodgkins M. Lajuana Ripple, DO

## 2021-02-20 NOTE — Patient Instructions (Addendum)
Checking sugar since it was a little elevated last visit.  Your EKG showed normal rhythm today.  I'll send a copy to Dr Allyne Gee renewed.  Preventive Care 35-56 Years Old, Female Preventive care refers to lifestyle choices and visits with your health care provider that can promote health and wellness. This includes: A yearly physical exam. This is also called an annual wellness visit. Regular dental and eye exams. Immunizations. Screening for certain conditions. Healthy lifestyle choices, such as: Eating a healthy diet. Getting regular exercise. Not using drugs or products that contain nicotine and tobacco. Limiting alcohol use. What can I expect for my preventive care visit? Physical exam Your health care provider will check your: Height and weight. These may be used to calculate your BMI (body mass index). BMI is a measurement that tells if you are at a healthy weight. Heart rate and blood pressure. Body temperature. Skin for abnormal spots. Counseling Your health care provider may ask you questions about your: Past medical problems. Family's medical history. Alcohol, tobacco, and drug use. Emotional well-being. Home life and relationship well-being. Sexual activity. Diet, exercise, and sleep habits. Work and work Statistician. Access to firearms. Method of birth control. Menstrual cycle. Pregnancy history. What immunizations do I need? Vaccines are usually given at various ages, according to a schedule. Your health care provider will recommend vaccines for you based on your age, medical history, and lifestyle or other factors, such as travel or where you work. What tests do I need? Blood tests Lipid and cholesterol levels. These may be checked every 5 years, or more often if you are over 24 years old. Hepatitis C test. Hepatitis B test. Screening Lung cancer screening. You may have this screening every year starting at age 77 if you have a 30-pack-year history  of smoking and currently smoke or have quit within the past 15 years. Colorectal cancer screening. All adults should have this screening starting at age 69 and continuing until age 66. Your health care provider may recommend screening at age 85 if you are at increased risk. You will have tests every 1-10 years, depending on your results and the type of screening test. Diabetes screening. This is done by checking your blood sugar (glucose) after you have not eaten for a while (fasting). You may have this done every 1-3 years. Mammogram. This may be done every 1-2 years. Talk with your health care provider about when you should start having regular mammograms. This may depend on whether you have a family history of breast cancer. BRCA-related cancer screening. This may be done if you have a family history of breast, ovarian, tubal, or peritoneal cancers. Pelvic exam and Pap test. This may be done every 3 years starting at age 37. Starting at age 4, this may be done every 5 years if you have a Pap test in combination with an HPV test. Other tests STD (sexually transmitted disease) testing, if you are at risk. Bone density scan. This is done to screen for osteoporosis. You may have this scan if you are at high risk for osteoporosis. Talk with your health care provider about your test results, treatment options, and if necessary, the need for more tests. Follow these instructions at home: Eating and drinking  Eat a diet that includes fresh fruits and vegetables, whole grains, lean protein, and low-fat dairy products. Take vitamin and mineral supplements as recommended by your health care provider. Do not drink alcohol if: Your health care provider tells you not  to drink. You are pregnant, may be pregnant, or are planning to become pregnant. If you drink alcohol: Limit how much you have to 0-1 drink a day. Be aware of how much alcohol is in your drink. In the [place] one drink equals one 12 oz  bottle of beer (355 mL), one 5 oz glass of wine (148 mL), or one 1 oz glass of hard liquor (44 mL). Lifestyle Take daily care of your teeth and gums. Brush your teeth every morning and night with fluoride toothpaste. Floss one time each day. Stay active. Exercise for at least 30 minutes 5 or more days each week. Do not use any products that contain nicotine or tobacco, such as cigarettes, e-cigarettes, and chewing tobacco. If you need help quitting, ask your health care provider. Do not use drugs. If you are sexually active, practice safe sex. Use a condom or other form of protection to prevent STIs (sexually transmitted infections). If you do not wish to become pregnant, use a form of birth control. If you plan to become pregnant, see your health care provider for a prepregnancy visit. If told by your health care provider, take low-dose aspirin daily starting at age 29. Find healthy ways to cope with stress, such as: Meditation, yoga, or listening to music. Journaling. Talking to a trusted person. Spending time with friends and family. Safety Always wear your seat belt while driving or riding in a vehicle. Do not drive: If you have been drinking alcohol. Do not ride with someone who has been drinking. When you are tired or distracted. While texting. Wear a helmet and other protective equipment during sports activities. If you have firearms in your house, make sure you follow all gun safety procedures. What's next? Visit your health care provider once a year for an annual wellness visit. Ask your health care provider how often you should have your eyes and teeth checked. Stay up to date on all vaccines. This information is not intended to replace advice given to you by your health care provider. Make sure you discuss any questions you have with your health care provider. Document Revised: 07/01/2020 Document Reviewed: 01/02/2018 Elsevier Patient Education  2022 [employer].

## 2021-02-21 ENCOUNTER — Telehealth: Payer: Self-pay | Admitting: Family Medicine

## 2021-02-21 LAB — TSH: TSH: 0.772 u[IU]/mL (ref 0.450–4.500)

## 2021-02-21 LAB — BAYER DCA HB A1C WAIVED: HB A1C (BAYER DCA - WAIVED): 5.2 % (ref 4.8–5.6)

## 2021-02-21 LAB — T4, FREE: Free T4: 0.92 ng/dL (ref 0.82–1.77)

## 2021-02-21 NOTE — Telephone Encounter (Signed)
NA

## 2021-02-28 NOTE — Telephone Encounter (Signed)
NA/ NCB  This encounter will be closed

## 2021-03-02 ENCOUNTER — Ambulatory Visit: Payer: Self-pay

## 2021-03-02 ENCOUNTER — Encounter: Payer: Self-pay | Admitting: Orthopaedic Surgery

## 2021-03-02 ENCOUNTER — Ambulatory Visit (INDEPENDENT_AMBULATORY_CARE_PROVIDER_SITE_OTHER): Payer: Medicare Other | Admitting: Orthopaedic Surgery

## 2021-03-02 ENCOUNTER — Other Ambulatory Visit: Payer: Self-pay

## 2021-03-02 ENCOUNTER — Ambulatory Visit: Payer: Medicare Other | Admitting: Orthopaedic Surgery

## 2021-03-02 DIAGNOSIS — M5440 Lumbago with sciatica, unspecified side: Secondary | ICD-10-CM

## 2021-03-02 DIAGNOSIS — M25551 Pain in right hip: Secondary | ICD-10-CM | POA: Diagnosis not present

## 2021-03-02 NOTE — Progress Notes (Signed)
Office Visit Note   Patient: Rebecca Moreno           Date of Birth: 1965-11-20           MRN: [id number] Visit Date: 03/02/2021              Requested by: [name], DO [place],  [place] [id number] PCP: [name], DO   Assessment & Plan: Visit Diagnoses:  1. Pain in right hip     Plan: Patient is a pleasant 55 year old woman with a 1+ year of lower back pain that radiates into her buttocks and down her right leg.  She also has associated feelings that her legs will fall asleep both with sitting and standing.  She did have some falls prior to this.  She has not had any treatment or taken any medication.  Her x-ray of her right hip is fairly normal.  X-ray of her right of her lumbar spine demonstrates degenerative changes at L5-S1 with associated sclerosis.  She has no loss of bowel or bladder control.  Her strength is actually good.  We recommend that she will go forward with an MRI.  Based on the MRI we could recommend physical therapy or possible epidural steroid injections.  She will follow-up once this is completed.  Follow-Up Instructions: No follow-ups on file.   Orders:  Orders Placed This Encounter  Procedures   XR Lumbar Spine 2-3 Views   XR Pelvis 1-2 Views   No orders of the defined types were placed in this encounter.     Procedures: No procedures performed   Clinical Data: No additional findings.   Subjective: Chief Complaint  Patient presents with   Lower Back - Pain  Patient presents today for right lower back pain. She said that it has been hurting for at least a year. No known injury that started it. She states that her pain radiates into her buttock and right hip. She does have some pain that will occasionally go down the right leg. She said that the pain wakes her at night. She does not take anything for pain.     Review of Systems  All other systems reviewed and are negative.   Objective: Vital Signs: LMP  12/13/2017 (Approximate)   Physical Exam Constitutional:      Appearance: Normal appearance.  HENT:     Head: Atraumatic.  Eyes:     Extraocular Movements: Extraocular movements intact.  Pulmonary:     Effort: Pulmonary effort is normal.  Skin:    General: Skin is warm and dry.  Neurological:     General: No focal deficit present.     Mental Status: She is alert.  Psychiatric:        Mood and Affect: Mood normal.        Behavior: Behavior normal.    Ortho Exam Patient is awake and alert has a flat affect Examination of her lower back demonstrates no palpable deformity or step-off.  She is tender over the lower right side of her back.  This does radiate into her buttock.  Her sensation is intact currently.  She has easily palpable distal pulses.  She has 5 out of 5 strength in her lower extremities.  Straight leg raise reproduces pain in her back but not her leg. Specialty Comments:  No specialty comments available.  Imaging: No results found.   PMFS History: Patient Active Problem List   Diagnosis Date Noted   Tobacco  use 06/01/2020   Amnesia 12/14/2019   Fatigue 12/14/2019   Hypoxemia 12/10/2019   Intracranial atherosclerosis 11/11/2019   Late effect of cerebrovascular accident (CVA) 09/25/2019   Cerebrovascular accident (CVA) due to occlusion of left carotid artery ([place]) 09/18/2019   Status post stroke 09/16/2019   Stage 3a chronic kidney disease ([place]) 08/19/2019   Spondylosis of lumbar spine 08/19/2019   Obesity (BMI 30-39.9) 08/19/2019   Erythrocytosis 01/05/2019   Hypoxia 09/03/2018   Atelectasis 09/03/2018   Asthma with status asthmaticus    Elevated blood pressure reading 05/19/2018   Mood disorder in conditions classified elsewhere 04/14/2018   History of colonic polyps 04/07/2018   Elevated transaminase level 04/07/2018   GERD (gastroesophageal reflux disease) 04/07/2018   Active bleeding 04/07/2018   Rectal bleeding 04/07/2018   Psoriasis 01/21/2018    Osteoporosis 01/21/2018   Polyp of colon 01/15/2018   Post-menopausal bleeding 01/15/2018   Acquired hypothyroidism 01/15/2018   History of gastric ulcer 01/15/2018   Gastroesophageal reflux disease with esophagitis 01/15/2018   Past Medical History:  Diagnosis Date   Acute pyelonephritis    Asthma    Bipolar disorder ([place])    Colon polyp    Complication of anesthesia    "sometimes hard to put to sleep"   Depression    Bipolar   Gastric ulcer    around 2013.  stress related   GERD (gastroesophageal reflux disease)    History of kidney stones    Hyperlipidemia    Hypothyroid    Polyp, uterus corpus    Sepsis due to Escherichia coli (E. coli) ([place]) 09/04/2018   Sleep apnea    no cpap used   Stroke [facility])     Family History  Problem Relation Age of Onset   Anxiety disorder Mother    Hypertension Mother    Heart failure Mother    Stroke Mother    Hyperlipidemia Father    Diabetes Father    Stroke Father    Hypertension Father    Colon polyps Father        older than 68   Diabetes Sister    Hypertension Sister    Ovarian cancer Sister    Asthma Daughter    Ovarian cancer Maternal Grandmother    Colon cancer Maternal Grandmother    Renal Disease Paternal Grandmother    Heart attack Paternal Grandfather    Bipolar disorder Other     Past Surgical History:  Procedure Laterality Date   ABLATION ON ENDOMETRIOSIS     COLONOSCOPY     Per patient, done around 2013 in [place], had polyp and overdue for follow up.   COLONOSCOPY WITH PROPOFOL N/A 05/12/2018   Procedure: COLONOSCOPY WITH PROPOFOL;  Surgeon: Daneil Dolin, MD;  [facility];  Service: Endoscopy;  Laterality: N/A;  12:00pm   CYSTOSCOPY W/ URETERAL STENT PLACEMENT Left 09/01/2018   Procedure: CYSTOSCOPY WITH RETROGRADE PYELOGRAM/URETERAL STENT PLACEMENT;  Surgeon: Ceasar Mons, MD;  [facility];  Service: Urology;  Laterality: Left;   CYSTOSCOPY/URETEROSCOPY/HOLMIUM LASER/STENT  PLACEMENT Left 09/17/2018   Procedure: CYSTOSCOPY/URETEROSCOPY/HOLMIUM LASER/STENT PLACEMENT;  Surgeon: Ceasar Mons, MD;  Location[facility];  Service: Urology;  Laterality: Left;  ONLY NEEDS 45 MIN   OVARIAN CYST SURGERY Left    POLYPECTOMY  05/12/2018   Procedure: POLYPECTOMY;  Surgeon: Daneil Dolin, MD;  [facility];  Service: Endoscopy;;  polyp at splenic flexure   TEMPOROMANDIBULAR JOINT SURGERY     9 surgeries  TUBAL LIGATION     vaginal childbirth     1   Social History   Occupational History   Occupation: disability  Tobacco Use   Smoking status: Former    Packs/day: 0.50    Years: 30.00    Pack years: 15.00    Types: Cigarettes    Quit date: 09/06/2019    Years since quitting: 1.4   Smokeless tobacco: Never  Vaping Use   Vaping Use: Never used  Substance and Sexual Activity   Alcohol use: Never   Drug use: Not Currently   Sexual activity: Not Currently    Birth control/protection: Surgical    Comment: tubal

## 2021-03-12 DIAGNOSIS — R0902 Hypoxemia: Secondary | ICD-10-CM | POA: Diagnosis not present

## 2021-03-12 DIAGNOSIS — J449 Chronic obstructive pulmonary disease, unspecified: Secondary | ICD-10-CM | POA: Diagnosis not present

## 2021-03-12 DIAGNOSIS — G4733 Obstructive sleep apnea (adult) (pediatric): Secondary | ICD-10-CM | POA: Diagnosis not present

## 2021-03-20 ENCOUNTER — Ambulatory Visit (HOSPITAL_COMMUNITY)
Admission: RE | Admit: 2021-03-20 | Discharge: 2021-03-20 | Disposition: A | Discharge status: Home / Self Care | Payer: Medicare Other | Source: Ambulatory Visit | Attending: Physician Assistant | Admitting: Physician Assistant

## 2021-03-20 ENCOUNTER — Other Ambulatory Visit: Payer: Self-pay

## 2021-03-20 DIAGNOSIS — M5440 Lumbago with sciatica, unspecified side: Secondary | ICD-10-CM | POA: Diagnosis not present

## 2021-03-20 DIAGNOSIS — M5117 Intervertebral disc disorders with radiculopathy, lumbosacral region: Secondary | ICD-10-CM | POA: Diagnosis not present

## 2021-04-11 DIAGNOSIS — G4733 Obstructive sleep apnea (adult) (pediatric): Secondary | ICD-10-CM | POA: Diagnosis not present

## 2021-04-11 DIAGNOSIS — J449 Chronic obstructive pulmonary disease, unspecified: Secondary | ICD-10-CM | POA: Diagnosis not present

## 2021-04-11 DIAGNOSIS — R0902 Hypoxemia: Secondary | ICD-10-CM | POA: Diagnosis not present

## 2021-04-20 ENCOUNTER — Other Ambulatory Visit: Payer: Self-pay | Admitting: Psychiatry

## 2021-04-25 ENCOUNTER — Other Ambulatory Visit: Payer: Self-pay

## 2021-04-25 ENCOUNTER — Inpatient Hospital Stay (HOSPITAL_COMMUNITY): Payer: Medicare Other | Attending: Hematology

## 2021-04-25 NOTE — Progress Notes (Signed)
.  Rebecca Moreno presents today for theraputic phlebotomy per MD orders. Last hgb 16.1/hct 47.7 was on 01/18/21 .VSS prior to procedure. Pt reports eating before arrival. Procedure started at 1450 using patients left AC. 468 mls of blood removed. Procedure ended at 1502. Gauze and coban applied to [name], site clean and dry. VSS upon completion of procedure. Pt denies dizziness, lightheadedness, or feeling faint. Discharged in satisfactory condition with follow up instructions.

## 2021-05-08 NOTE — Progress Notes (Deleted)
BH MD/PA/NP OP Progress Note  05/08/2021 3:43 PM Rebecca Moreno  MRN:  [id number]  Chief Complaint:  HPI: *** Not checked lipid  Visit Diagnosis: No diagnosis found.  Past Psychiatric History: Please see initial evaluation for full details. I have reviewed the history. No updates at this time.     Past Medical History:  Past Medical History:  Diagnosis Date   Acute pyelonephritis    Asthma    Bipolar disorder ([name])    Colon polyp    Complication of anesthesia    "sometimes hard to put to sleep"   Depression    Bipolar   Gastric ulcer    around 2013.  stress related   GERD (gastroesophageal reflux disease)    History of kidney stones    Hyperlipidemia    Hypothyroid    Polyp, uterus corpus    Sepsis due to Escherichia coli (E. coli) ([place]) 09/04/2018   Sleep apnea    no cpap used   Stroke [facility])     Past Surgical History:  Procedure Laterality Date   ABLATION ON ENDOMETRIOSIS     COLONOSCOPY     Per patient, done around 2013 in [place], had polyp and overdue for follow up.   COLONOSCOPY WITH PROPOFOL N/A 05/12/2018   Procedure: COLONOSCOPY WITH PROPOFOL;  Surgeon: Daneil Dolin, MD;  [facility];  Service: Endoscopy;  Laterality: N/A;  12:00pm   CYSTOSCOPY W/ URETERAL STENT PLACEMENT Left 09/01/2018   Procedure: CYSTOSCOPY WITH RETROGRADE PYELOGRAM/URETERAL STENT PLACEMENT;  Surgeon: Ceasar Mons, MD;  [facility]: [facility];  Service: Urology;  Laterality: Left;   CYSTOSCOPY/URETEROSCOPY/HOLMIUM LASER/STENT PLACEMENT Left 09/17/2018   Procedure: CYSTOSCOPY/URETEROSCOPY/HOLMIUM LASER/STENT PLACEMENT;  Surgeon: Ceasar Mons, MD;  Location: [facility];  Service: Urology;  Laterality: Left;  ONLY NEEDS 45 MIN   OVARIAN CYST SURGERY Left    POLYPECTOMY  05/12/2018   Procedure: POLYPECTOMY;  Surgeon: Daneil Dolin, MD;  [facility];  Service: Endoscopy;;  polyp at splenic flexure   TEMPOROMANDIBULAR JOINT SURGERY     9 surgeries    TUBAL LIGATION     vaginal childbirth     1    Family Psychiatric History: Please see initial evaluation for full details. I have reviewed the history. No updates at this time.     Family History:  Family History  Problem Relation Age of Onset   Anxiety disorder Mother    Hypertension Mother    Heart failure Mother    Stroke Mother    Hyperlipidemia Father    Diabetes Father    Stroke Father    Hypertension Father    Colon polyps Father        older than 51   Diabetes Sister    Hypertension Sister    Ovarian cancer Sister    Asthma Daughter    Ovarian cancer Maternal Grandmother    Colon cancer Maternal Grandmother    Renal Disease Paternal Grandmother    Heart attack Paternal Grandfather    Bipolar disorder Other     Social History:  Social History   Socioeconomic History   Marital status: Married    Spouse name: Not on file   Number of children: 1   Years of education: Not on file   Highest education level: High school graduate  Occupational History   Occupation: disability  Tobacco Use   Smoking status: Former    Packs/day: 0.50    Years: 30.00    Pack years:  15.00    Types: Cigarettes    Quit date: 09/06/2019    Years since quitting: 1.6   Smokeless tobacco: Never  Vaping Use   Vaping Use: Never used  Substance and Sexual Activity   Alcohol use: Never   Drug use: Not Currently   Sexual activity: Not Currently    Birth control/protection: Surgical    Comment: tubal  Other Topics Concern   Not on file  Social History Narrative   Not on file   Social Determinants of Health   Financial Resource Strain: Not on file  Food Insecurity: Not on file  Transportation Needs: Not on file  Physical Activity: Not on file  Stress: Not on file  Social Connections: Not on file    Allergies:  Allergies  Allergen Reactions   Lithium Other (See Comments)    Psoriasis     Metabolic Disorder Labs: Lab Results  Component Value Date   HGBA1C 5.2 02/20/2021    No results found for: PROLACTIN Lab Results  Component Value Date   CHOL 212 (H) 12/18/2018   TRIG 171 (H) 12/18/2018   HDL 44 12/18/2018   CHOLHDL 4.8 (H) 12/18/2018   LDLCALC 134 (H) 12/18/2018   LDLCALC 168 (H) 01/15/2018   Lab Results  Component Value Date   TSH 0.772 02/20/2021   TSH 0.912 07/06/2020    Therapeutic Level Labs: No results found for: LITHIUM No results found for: VALPROATE No components found for:  CBMZ  Current Medications: Current Outpatient Medications  Medication Sig Dispense Refill   albuterol (VENTOLIN HFA) 108 (90 Base) MCG/ACT inhaler Inhale 1 puff into the lungs every 6 (six) hours as needed for wheezing or shortness of breath. (Patient not taking: Reported on 04/25/2021) 6.7 g 1   atorvastatin (LIPITOR) 80 MG tablet TAKE ONE TABLET AT BEDTIME 90 tablet 3   CALCIUM CITRATE-VITAMIN D3 PO Take 1 tablet by mouth daily.     Cariprazine HCl (VRAYLAR) 6 MG CAPS Take 1 capsule (6 mg total) by mouth daily. 90 capsule 1   Cholecalciferol (VITAMIN D3) 5000 units TABS Take 5,000 Units by mouth daily.      clonazePAM (KLONOPIN) 1 MG tablet Take 1 tablet (1 mg total) by mouth 2 (two) times daily as needed for anxiety. 60 tablet 2   DEXILANT 30 MG capsule TAKE (1) CAPSULE DAILY 90 capsule 3   DHS SAL 3 % SHAM Apply topically every morning.     famotidine (PEPCID) 20 MG tablet TAKE 1 TABLET DAILY 90 tablet 0   fluticasone (FLONASE) 50 MCG/ACT nasal spray SPRAY 2 SPRAYS INTO EACH NOSTRIL EVERY DAY 48 mL 0   fluticasone (FLOVENT HFA) 220 MCG/ACT inhaler Inhale 2 puffs into the lungs in the morning and at bedtime. 1 each 12   gabapentin (NEURONTIN) 300 MG capsule Take 600 mg by mouth at bedtime.     lamoTRIgine (LAMICTAL) 100 MG tablet Take 3 tablets (300 mg total) by mouth at bedtime. 270 tablet 1   levothyroxine (SYNTHROID) 50 MCG tablet TAKE 1 TABLET DAILY 90 tablet 1   loratadine (CLARITIN) 10 MG tablet TAKE 1 TABLET DAILY 30 tablet 11   magnesium gluconate  (MAGONATE) 500 MG tablet Take 500 mg by mouth daily.     Multiple Vitamins-Minerals (MULTIVITAMIN WOMEN PO) Take 1 tablet by mouth daily.     QUEtiapine (SEROQUEL) 300 MG tablet Take 1 tablet (300 mg total) by mouth at bedtime. 90 tablet 1   venlafaxine XR (EFFEXOR-XR) 150 MG 24 hr  capsule Take 2 capsules (300 mg total) by mouth daily. 180 capsule 1   No current facility-administered medications for this visit.     Musculoskeletal: Strength & Muscle Tone:  N/A Gait & Station:  N/A Patient leans: N/A  Psychiatric Specialty Exam: Review of Systems  Last menstrual period 12/13/2017.There is no height or weight on file to calculate BMI.  General Appearance: {Appearance:22683}  Eye Contact:  {BHH EYE CONTACT:22684}  Speech:  Clear and Coherent  Volume:  Normal  Mood:  {BHH MOOD:22306}  Affect:  {Affect (PAA):22687}  Thought Process:  Coherent  Orientation:  Full (Time, Place, and Person)  Thought Content: Logical   Suicidal Thoughts:  {ST/HT (PAA):22692}  Homicidal Thoughts:  {ST/HT (PAA):22692}  Memory:  Immediate;   Good  Judgement:  {Judgement (PAA):22694}  Insight:  {Insight (PAA):22695}  Psychomotor Activity:  Normal  Concentration:  Concentration: Good and Attention Span: Good  Recall:  Good  Fund of Knowledge: Good  Language: Good  Akathisia:  No  Handed:  Right  AIMS (if indicated): not done  Assets:  Communication Skills Desire for Improvement  ADL's:  Intact  Cognition: WNL  Sleep:  {BHH GOOD/FAIR/POOR:22877}   Screenings: GAD-7    Flowsheet Row Office Visit from 02/20/2021 in [place] Visit from 06/29/2020 in [place] Visit from 08/19/2019 in [place] Visit from 04/16/2018 in [place]  Total GAD-7 Score 6 10 11 3       Mini-Mental    [place] from 07/22/2018 in [place]  Total Score (max  30 points ) 30      PHQ2-9    [place] Visit from 02/20/2021 in [place] Video Visit from 02/08/2021 in [place] Video Visit from 08/18/2020 in [place] Office Visit from 07/06/2020 in [place] Office Visit from 06/29/2020 in [place]  PHQ-2 Total Score 4 3 2 1 2   PHQ-9 Total Score 9 9 4 5 4       Flowsheet Row Video Visit from 11/15/2020 in [place] Video Visit from 08/18/2020 in [place] No Risk No Risk        Assessment and Plan:  Rebecca Moreno is a 56 y.o. year old female with a history of depression, hypothyroidism, who presents for follow up appointment for below.     1. MDD (major depressive disorder), recurrent episode, mild ([place]) # Bipolar I disorder by history Although she continues to report depressive symptoms and anxiety, she has been handling things relatively well since her last visit.  Psychosocial stressors includes taking care of her mother-in-law, marital conflict.  She has strong preference to stay on the current medication (which was originally prescribed by other provider) despite being provided psychoeducation regarding polypharmacy.  Will continue venlafaxine to target depression.  Will continue lamotrigine, Vraylar, and quetiapine to target mood dysregulation.  Will continue clonazepam as needed for anxiety. Noted that although the patient was reportedly diagnosed with bipolar I disorder in the past, both the patient and her husband denies any manic/hypomanic episode except irritability, paranoia in the context of depression.       This clinician has discussed the side effect associated with medication prescribed during this encounter. Please refer to notes in the previous encounters for more details.     Plan I have reviewed and  updated  plans as below  1. Continue Venlafaxine 300 mg daily 2. Continue Lamotrigine 300 mg daily 3. Continue Vraylar 6 mg 4. Continue Quetiapine 300 mg at night 5. Continue Clonazepam 1 mg twice a day as needed for anxiety  6. Next appointment: 1/4 at 1 PM, phone. She declined in person visit due to location - She has an upcoming appointment with her PCP in Oct. She was advised to obtain lipid panels/glucose at the next visit    This clinician has discussed the side effect associated with medication prescribed during this encounter. Please refer to notes in the previous encounters for more details.       Past trials of medication: sertraline, fluoxetine, lexapro, Celexa, duloxetine, Wellbutrin  lithium (psoriasis), Depakote (did not work), olanzapine, Abilify (weight gain), latuda, Geodon    The patient demonstrates the following risk factors for suicide: Chronic risk factors for suicide include: psychiatric disorder of depression. Acute risk factors for suicide include: family or marital conflict and unemployment. Protective factors for this patient include: positive social support and hope for the future. Considering these factors, the overall suicide risk at this point appears to be low. Patient is appropriate for outpatient follow up.    Norman Clay, MD 05/08/2021, 3:43 PM

## 2021-05-09 ENCOUNTER — Telehealth: Payer: Self-pay | Admitting: Family Medicine

## 2021-05-09 NOTE — Telephone Encounter (Signed)
No answer unable to leave a message for patient to call back and schedule Medicare Annual Wellness Visit (AWV) to be completed by video or phone.   Last AWV:  07/23/2019  Please schedule at anytime with [place]  45 minute appointment  Any questions, please contact me at [phone]

## 2021-05-10 ENCOUNTER — Telehealth: Payer: Self-pay | Admitting: Psychiatry

## 2021-05-10 ENCOUNTER — Telehealth: Payer: Medicare Other | Admitting: Psychiatry

## 2021-05-10 ENCOUNTER — Other Ambulatory Visit: Payer: Self-pay

## 2021-05-10 NOTE — Telephone Encounter (Signed)
Called the patient twice for appointment scheduled today. The patient did not answer the phone. Left voice message to contact the office [phone]).

## 2021-05-12 DIAGNOSIS — J449 Chronic obstructive pulmonary disease, unspecified: Secondary | ICD-10-CM | POA: Diagnosis not present

## 2021-05-12 DIAGNOSIS — G4733 Obstructive sleep apnea (adult) (pediatric): Secondary | ICD-10-CM | POA: Diagnosis not present

## 2021-05-12 DIAGNOSIS — R0902 Hypoxemia: Secondary | ICD-10-CM | POA: Diagnosis not present

## 2021-05-17 ENCOUNTER — Other Ambulatory Visit: Payer: Self-pay | Admitting: Family Medicine

## 2021-05-27 ENCOUNTER — Other Ambulatory Visit: Payer: Self-pay | Admitting: Psychiatry

## 2021-05-28 ENCOUNTER — Other Ambulatory Visit: Payer: Self-pay | Admitting: Psychiatry

## 2021-05-28 ENCOUNTER — Telehealth: Payer: Self-pay | Admitting: Psychiatry

## 2021-05-28 NOTE — Telephone Encounter (Signed)
Ordered clonazepam refill based on the request. Please contact the patient and make a follow up appointment. I will not be able to prescribe any more refills without evaluation.

## 2021-05-30 NOTE — Telephone Encounter (Signed)
Pt called back to set up appt.

## 2021-05-30 NOTE — Progress Notes (Deleted)
BH MD/PA/NP OP Progress Note  05/30/2021 3:40 PM Rebecca Moreno  MRN:  [id number]  Chief Complaint:  HPI: *** Visit Diagnosis: No diagnosis found.  Past Psychiatric History: Please see initial evaluation for full details. I have reviewed the history. No updates at this time.     Past Medical History:  Past Medical History:  Diagnosis Date   Acute pyelonephritis    Asthma    Bipolar disorder ([place])    Colon polyp    Complication of anesthesia    "sometimes hard to put to sleep"   Depression    Bipolar   Gastric ulcer    around 2013.  stress related   GERD (gastroesophageal reflux disease)    History of kidney stones    Hyperlipidemia    Hypothyroid    Polyp, uterus corpus    Sepsis due to Escherichia coli (E. coli) ([place]) 09/04/2018   Sleep apnea    no cpap used   Stroke [facility])     Past Surgical History:  Procedure Laterality Date   ABLATION ON ENDOMETRIOSIS     COLONOSCOPY     Per patient, done around 2013 in [place], had polyp and overdue for follow up.   COLONOSCOPY WITH PROPOFOL N/A 05/12/2018   Procedure: COLONOSCOPY WITH PROPOFOL;  Surgeon: Daneil Dolin, MD;  [facility];  Service: Endoscopy;  Laterality: N/A;  12:00pm   CYSTOSCOPY W/ URETERAL STENT PLACEMENT Left 09/01/2018   Procedure: CYSTOSCOPY WITH RETROGRADE PYELOGRAM/URETERAL STENT PLACEMENT;  Surgeon: Ceasar Mons, MD;  [facility];  Service: Urology;  Laterality: Left;   CYSTOSCOPY/URETEROSCOPY/HOLMIUM LASER/STENT PLACEMENT Left 09/17/2018   Procedure: CYSTOSCOPY/URETEROSCOPY/HOLMIUM LASER/STENT PLACEMENT;  Surgeon: Ceasar Mons, MD;  [facility];  Service: Urology;  Laterality: Left;  ONLY NEEDS 45 MIN   OVARIAN CYST SURGERY Left    POLYPECTOMY  05/12/2018   Procedure: POLYPECTOMY;  Surgeon: Daneil Dolin, MD;  [facility];  Service: Endoscopy;;  polyp at splenic flexure   TEMPOROMANDIBULAR JOINT SURGERY     9 surgeries   TUBAL LIGATION      vaginal childbirth     1    Family Psychiatric History: Please see initial evaluation for full details. I have reviewed the history. No updates at this time.     Family History:  Family History  Problem Relation Age of Onset   Anxiety disorder Mother    Hypertension Mother    Heart failure Mother    Stroke Mother    Hyperlipidemia Father    Diabetes Father    Stroke Father    Hypertension Father    Colon polyps Father        older than 31   Diabetes Sister    Hypertension Sister    Ovarian cancer Sister    Asthma Daughter    Ovarian cancer Maternal Grandmother    Colon cancer Maternal Grandmother    Renal Disease Paternal Grandmother    Heart attack Paternal Grandfather    Bipolar disorder Other     Social History:  Social History   Socioeconomic History   Marital status: Married    Spouse name: Not on file   Number of children: 1   Years of education: Not on file   Highest education level: High school graduate  Occupational History   Occupation: disability  Tobacco Use   Smoking status: Former    Packs/day: 0.50    Years: 30.00    Pack years: 15.00  Types: Cigarettes    Quit date: 09/06/2019    Years since quitting: 1.7   Smokeless tobacco: Never  Vaping Use   Vaping Use: Never used  Substance and Sexual Activity   Alcohol use: Never   Drug use: Not Currently   Sexual activity: Not Currently    Birth control/protection: Surgical    Comment: tubal  Other Topics Concern   Not on file  Social History Narrative   Not on file   Social Determinants of Health   Financial Resource Strain: Not on file  Food Insecurity: Not on file  Transportation Needs: Not on file  Physical Activity: Not on file  Stress: Not on file  Social Connections: Not on file    Allergies:  Allergies  Allergen Reactions   Lithium Other (See Comments)    Psoriasis     Metabolic Disorder Labs: Lab Results  Component Value Date   HGBA1C 5.2 02/20/2021   No results  found for: PROLACTIN Lab Results  Component Value Date   CHOL 212 (H) 12/18/2018   TRIG 171 (H) 12/18/2018   HDL 44 12/18/2018   CHOLHDL 4.8 (H) 12/18/2018   LDLCALC 134 (H) 12/18/2018   LDLCALC 168 (H) 01/15/2018   Lab Results  Component Value Date   TSH 0.772 02/20/2021   TSH 0.912 07/06/2020    Therapeutic Level Labs: No results found for: LITHIUM No results found for: VALPROATE No components found for:  CBMZ  Current Medications: Current Outpatient Medications  Medication Sig Dispense Refill   famotidine (PEPCID) 20 MG tablet TAKE ONE TABLET BY MOUTH EVERY DAY 90 tablet 0   albuterol (VENTOLIN HFA) 108 (90 Base) MCG/ACT inhaler Inhale 1 puff into the lungs every 6 (six) hours as needed for wheezing or shortness of breath. (Patient not taking: Reported on 04/25/2021) 6.7 g 1   atorvastatin (LIPITOR) 80 MG tablet TAKE ONE TABLET AT BEDTIME 90 tablet 3   CALCIUM CITRATE-VITAMIN D3 PO Take 1 tablet by mouth daily.     Cariprazine HCl (VRAYLAR) 6 MG CAPS Take 1 capsule (6 mg total) by mouth daily. 90 capsule 1   Cholecalciferol (VITAMIN D3) 5000 units TABS Take 5,000 Units by mouth daily.      clonazePAM (KLONOPIN) 1 MG tablet Take 1 tablet (1 mg total) by mouth 2 (two) times daily as needed for anxiety. 60 tablet 0   DEXILANT 30 MG capsule TAKE (1) CAPSULE DAILY 90 capsule 3   DHS SAL 3 % SHAM Apply topically every morning.     fluticasone (FLONASE) 50 MCG/ACT nasal spray SPRAY 2 SPRAYS INTO EACH NOSTRIL EVERY DAY 48 mL 0   fluticasone (FLOVENT HFA) 220 MCG/ACT inhaler Inhale 2 puffs into the lungs in the morning and at bedtime. 1 each 12   gabapentin (NEURONTIN) 300 MG capsule Take 600 mg by mouth at bedtime.     lamoTRIgine (LAMICTAL) 100 MG tablet Take 3 tablets (300 mg total) by mouth at bedtime. 270 tablet 1   levothyroxine (SYNTHROID) 50 MCG tablet TAKE 1 TABLET DAILY 90 tablet 1   loratadine (CLARITIN) 10 MG tablet TAKE 1 TABLET DAILY 30 tablet 11   magnesium gluconate  (MAGONATE) 500 MG tablet Take 500 mg by mouth daily.     Multiple Vitamins-Minerals (MULTIVITAMIN WOMEN PO) Take 1 tablet by mouth daily.     QUEtiapine (SEROQUEL) 300 MG tablet Take 1 tablet (300 mg total) by mouth at bedtime. 90 tablet 1   venlafaxine XR (EFFEXOR-XR) 150 MG 24 hr capsule  Take 2 capsules (300 mg total) by mouth daily. 180 capsule 1   No current facility-administered medications for this visit.     Musculoskeletal: Strength & Muscle Tone:  N/A Gait & Station:  N/A Patient leans: N/A  Psychiatric Specialty Exam: Review of Systems  Last menstrual period 12/13/2017.There is no height or weight on file to calculate BMI.  General Appearance: {Appearance:22683}  Eye Contact:  {BHH EYE CONTACT:22684}  Speech:  Clear and Coherent  Volume:  Normal  Mood:  {BHH MOOD:22306}  Affect:  {Affect (PAA):22687}  Thought Process:  Coherent  Orientation:  Full (Time, Place, and Person)  Thought Content: Logical   Suicidal Thoughts:  {ST/HT (PAA):22692}  Homicidal Thoughts:  {ST/HT (PAA):22692}  Memory:  Immediate;   Good  Judgement:  {Judgement (PAA):22694}  Insight:  {Insight (PAA):22695}  Psychomotor Activity:  Normal  Concentration:  Concentration: Good and Attention Span: Good  Recall:  Good  Fund of Knowledge: Good  Language: Good  Akathisia:  No  Handed:  Right  AIMS (if indicated): not done  Assets:  Communication Skills Desire for Improvement  ADL's:  Intact  Cognition: WNL  Sleep:  {BHH GOOD/FAIR/POOR:22877}   Screenings: GAD-7    Flowsheet Row Office Visit from 02/20/2021 in [place] Visit from 06/29/2020 in [place] Visit from 08/19/2019 in [place] Visit from 04/16/2018 in [place]  Total GAD-7 Score 6 10 11 3       Mini-Mental    [place] from 07/22/2018 in [place]  Total Score (max  30 points ) 30      PHQ2-9    [place] Visit from 02/20/2021 in [place] Video Visit from 02/08/2021 in [place] Video Visit from 08/18/2020 in [place] Office Visit from 07/06/2020 in [place] Office Visit from 06/29/2020 in [place]  PHQ-2 Total Score 4 3 2 1 2   PHQ-9 Total Score 9 9 4 5 4       Flowsheet Row Video Visit from 11/15/2020 in [place] Video Visit from 08/18/2020 in [place] No Risk No Risk        Assessment and Plan:  Rebecca Moreno is a 56 y.o. year old female with a history of depression, hypothyroidism , who presents for follow up appointment for below.     1. MDD (major depressive disorder), recurrent episode, mild ([place]) # Bipolar I disorder by history Although she continues to report depressive symptoms and anxiety, she has been handling things relatively well since her last visit.  Psychosocial stressors includes taking care of her mother-in-law, marital conflict.  She has strong preference to stay on the current medication (which was originally prescribed by other provider) despite being provided psychoeducation regarding polypharmacy.  Will continue venlafaxine to target depression.  Will continue lamotrigine, Vraylar, and quetiapine to target mood dysregulation.  Will continue clonazepam as needed for anxiety. Noted that although the patient was reportedly diagnosed with bipolar I disorder in the past, both the patient and her husband denies any manic/hypomanic episode except irritability, paranoia in the context of depression.       This clinician has discussed the side effect associated with medication prescribed during this encounter. Please refer to notes in the previous encounters for more details.     Plan  1. Continue  Venlafaxine 300  mg daily 2. Continue Lamotrigine 300 mg daily 3. Continue Vraylar 6 mg 4. Continue Quetiapine 300 mg at night 5. Continue Clonazepam 1 mg twice a day as needed for anxiety  6. Next appointment: 1/4 at 1 PM, phone. She declined in person visit due to location - She has an upcoming appointment with her PCP in Oct. She was advised to obtain lipid panels/glucose at the next visit    This clinician has discussed the side effect associated with medication prescribed during this encounter. Please refer to notes in the previous encounters for more details.       Past trials of medication: sertraline, fluoxetine, lexapro, Celexa, duloxetine, Wellbutrin  lithium (psoriasis), Depakote (did not work), olanzapine, Abilify (weight gain), latuda, Geodon    The patient demonstrates the following risk factors for suicide: Chronic risk factors for suicide include: psychiatric disorder of depression. Acute risk factors for suicide include: family or marital conflict and unemployment. Protective factors for this patient include: positive social support and hope for the future. Considering these factors, the overall suicide risk at this point appears to be low. Patient is appropriate for outpatient follow up.    Norman Clay, MD 05/30/2021, 3:40 PM

## 2021-05-31 ENCOUNTER — Other Ambulatory Visit: Payer: Self-pay

## 2021-05-31 ENCOUNTER — Telehealth: Payer: Self-pay | Admitting: Family Medicine

## 2021-05-31 ENCOUNTER — Telehealth: Payer: Self-pay | Admitting: Psychiatry

## 2021-05-31 ENCOUNTER — Telehealth: Payer: Medicare Other | Admitting: Psychiatry

## 2021-05-31 NOTE — Telephone Encounter (Signed)
Called the patient twice for appointment scheduled today. The patient did not answer the phone. Left voice message to contact the office [phone]).

## 2021-05-31 NOTE — Telephone Encounter (Signed)
Left message for patient to call back and schedule Medicare Annual Wellness Visit (AWV) to be completed by video or phone.   Last AWV: 07/23/2019  Please schedule at anytime with [place]  45 minute appointment  Any questions, please contact me at [phone]

## 2021-06-12 DIAGNOSIS — G4733 Obstructive sleep apnea (adult) (pediatric): Secondary | ICD-10-CM | POA: Diagnosis not present

## 2021-06-12 DIAGNOSIS — J449 Chronic obstructive pulmonary disease, unspecified: Secondary | ICD-10-CM | POA: Diagnosis not present

## 2021-06-12 DIAGNOSIS — R0902 Hypoxemia: Secondary | ICD-10-CM | POA: Diagnosis not present

## 2021-06-21 ENCOUNTER — Other Ambulatory Visit: Payer: Self-pay | Admitting: Psychiatry

## 2021-06-21 ENCOUNTER — Other Ambulatory Visit: Payer: Self-pay | Admitting: Family Medicine

## 2021-06-21 NOTE — Telephone Encounter (Signed)
Last office visit 02/20/21 Medication never filled from our office

## 2021-06-21 NOTE — Telephone Encounter (Signed)
Please have Rx sent to prescribing physician

## 2021-06-22 NOTE — Telephone Encounter (Signed)
No prescribing physician information found, encounter closed.

## 2021-06-27 ENCOUNTER — Telehealth: Payer: Self-pay | Admitting: Psychiatry

## 2021-06-27 ENCOUNTER — Other Ambulatory Visit: Payer: Self-pay | Admitting: Psychiatry

## 2021-06-27 NOTE — Telephone Encounter (Signed)
Ordered refill of clonazepam (limited amount) as a courtesy based on the request.  Please contact the patient and make follow-up appointment.  Please note that no more medications to be prescribed without evaluation.  Please also discuss attendance policy; she had no shows.

## 2021-06-27 NOTE — Telephone Encounter (Signed)
(  Copied from other note, which was routed to [facility] today). Ordered refill of clonazepam (limited amount) as a courtesy based on the request.  Please contact the patient and make follow-up appointment.  Please note that no more medications to be prescribed without evaluation.  Please also discuss attendance policy; she had no shows.

## 2021-07-03 ENCOUNTER — Other Ambulatory Visit: Payer: Self-pay | Admitting: Family Medicine

## 2021-07-03 DIAGNOSIS — J301 Allergic rhinitis due to pollen: Secondary | ICD-10-CM

## 2021-07-10 ENCOUNTER — Ambulatory Visit (INDEPENDENT_AMBULATORY_CARE_PROVIDER_SITE_OTHER): Payer: Medicare Other

## 2021-07-10 VITALS — Ht 66.0 in | Wt 182.0 lb

## 2021-07-10 DIAGNOSIS — Z0000 Encounter for general adult medical examination without abnormal findings: Secondary | ICD-10-CM

## 2021-07-10 DIAGNOSIS — G4733 Obstructive sleep apnea (adult) (pediatric): Secondary | ICD-10-CM | POA: Diagnosis not present

## 2021-07-10 DIAGNOSIS — R0902 Hypoxemia: Secondary | ICD-10-CM | POA: Diagnosis not present

## 2021-07-10 DIAGNOSIS — J449 Chronic obstructive pulmonary disease, unspecified: Secondary | ICD-10-CM | POA: Diagnosis not present

## 2021-07-10 NOTE — Patient Instructions (Signed)
Rebecca Moreno , Thank you for taking time to come for your Medicare Wellness Visit. I appreciate your ongoing commitment to your health goals. Please review the following plan we discussed and let me know if I can assist you in the future.   Screening recommendations/referrals: Colonoscopy: Done 05/12/2018 Repeat in 5 years   Mammogram: Done 09/30/2019 Repeat annually  Bone Density: Due at age 56.  Recommended yearly ophthalmology/optometry visit for glaucoma screening and checkup Recommended yearly dental visit for hygiene and checkup  Vaccinations: Influenza vaccine: Done 03/03/2022. Repeat annually  Pneumococcal vaccine: Due at age 85. Tdap vaccine: Done 01/06/2016 Repeat in 10 years  Shingles vaccine: Discussed.   Covid-19: Done 07/27/2019, 08/24/2019, 04/22/2020, 09/27/2020 and 02/21/2021  Advanced directives: Please bring a copy of your health care power of attorney and living will to the office to be added to your chart at your convenience.   Conditions/risks identified: Aim for 30 minutes of exercise or brisk walking, 6-8 glasses of water, and 5 servings of fruits and vegetables each day. If you wish to quit smoking, help is available. For free tobacco cessation program offerings call the [facility] at 3255564759 or Live Well Line at 417 785 7507. You may also visit [email or web address] or email [email or web address] for more information on other programs.   You may also call 1-800-QUIT-NOW (404)081-6169) or visit www.VirusCrisis.dk or www.BecomeAnEx.org for additional resources on smoking cessation.    Next appointment: Follow up in one year for your annual wellness visit. 2024.  Preventive Care 40-64 Years, Female Preventive care refers to lifestyle choices and visits with your health care provider that can promote health and wellness. What does preventive care include? A yearly physical exam. This is also called an annual well check. Dental exams once  or twice a year. Routine eye exams. Ask your health care provider how often you should have your eyes checked. Personal lifestyle choices, including: Daily care of your teeth and gums. Regular physical activity. Eating a healthy diet. Avoiding tobacco and drug use. Limiting alcohol use. Practicing safe sex. Taking low-dose aspirin daily starting at age 3. Taking vitamin and mineral supplements as recommended by your health care provider. What happens during an annual well check? The services and screenings done by your health care provider during your annual well check will depend on your age, overall health, lifestyle risk factors, and family history of disease. Counseling  Your health care provider may ask you questions about your: Alcohol use. Tobacco use. Drug use. Emotional well-being. Home and relationship well-being. Sexual activity. Eating habits. Work and work Statistician. Method of birth control. Menstrual cycle. Pregnancy history. Screening  You may have the following tests or measurements: Height, weight, and BMI. Blood pressure. Lipid and cholesterol levels. These may be checked every 5 years, or more frequently if you are over 32 years old. Skin check. Lung cancer screening. You may have this screening every year starting at age 28 if you have a 30-pack-year history of smoking and currently smoke or have quit within the past 15 years. Fecal occult blood test (FOBT) of the stool. You may have this test every year starting at age 56. Flexible sigmoidoscopy or colonoscopy. You may have a sigmoidoscopy every 5 years or a colonoscopy every 10 years starting at age 59. Hepatitis C blood test. Hepatitis B blood test. Sexually transmitted disease (STD) testing. Diabetes screening. This is done by checking your blood sugar (glucose) after you have not eaten for a while (fasting). You may have this  done every 1-3 years. Mammogram. This may be done every 1-2 years. Talk to  your health care provider about when you should start having regular mammograms. This may depend on whether you have a family history of breast cancer. BRCA-related cancer screening. This may be done if you have a family history of breast, ovarian, tubal, or peritoneal cancers. Pelvic exam and Pap test. This may be done every 3 years starting at age 22. Starting at age 53, this may be done every 5 years if you have a Pap test in combination with an HPV test. Bone density scan. This is done to screen for osteoporosis. You may have this scan if you are at high risk for osteoporosis. Discuss your test results, treatment options, and if necessary, the need for more tests with your health care provider. Vaccines  Your health care provider may recommend certain vaccines, such as: Influenza vaccine. This is recommended every year. Tetanus, diphtheria, and acellular pertussis (Tdap, Td) vaccine. You may need a Td booster every 10 years. Zoster vaccine. You may need this after age 65. Pneumococcal 13-valent conjugate (PCV13) vaccine. You may need this if you have certain conditions and were not previously vaccinated. Pneumococcal polysaccharide (PPSV23) vaccine. You may need one or two doses if you smoke cigarettes or if you have certain conditions. Talk to your health care provider about which screenings and vaccines you need and how often you need them. This information is not intended to replace advice given to you by your health care provider. Make sure you discuss any questions you have with your health care provider. Document Released: 05/20/2015 Document Revised: 01/11/2016 Document Reviewed: 02/22/2015 Elsevier Interactive Patient Education  2017 [employer] Prevention in the Home Falls can cause injuries. They can happen to people of all ages. There are many things you can do to make your home safe and to help prevent falls. What can I do on the outside of my home? Regularly fix the  edges of walkways and driveways and fix any cracks. Remove anything that might make you trip as you walk through a door, such as a raised step or threshold. Trim any bushes or trees on the path to your home. Use bright outdoor lighting. Clear any walking paths of anything that might make someone trip, such as rocks or tools. Regularly check to see if handrails are loose or broken. Make sure that both sides of any steps have handrails. Any raised decks and porches should have guardrails on the edges. Have any leaves, snow, or ice cleared regularly. Use sand or salt on walking paths during winter. Clean up any spills in your garage right away. This includes oil or grease spills. What can I do in the bathroom? Use night lights. Install grab bars by the toilet and in the tub and shower. Do not use towel bars as grab bars. Use non-skid mats or decals in the tub or shower. If you need to sit down in the shower, use a plastic, non-slip stool. Keep the floor dry. Clean up any water that spills on the floor as soon as it happens. Remove soap buildup in the tub or shower regularly. Attach bath mats securely with double-sided non-slip rug tape. Do not have throw rugs and other things on the floor that can make you trip. What can I do in the bedroom? Use night lights. Make sure that you have a light by your bed that is easy to reach. Do not use any  sheets or blankets that are too big for your bed. They should not hang down onto the floor. Have a firm chair that has side arms. You can use this for support while you get dressed. Do not have throw rugs and other things on the floor that can make you trip. What can I do in the kitchen? Clean up any spills right away. Avoid walking on wet floors. Keep items that you use a lot in easy-to-reach places. If you need to reach something above you, use a strong step stool that has a grab bar. Keep electrical cords out of the way. Do not use floor polish or  wax that makes floors slippery. If you must use wax, use non-skid floor wax. Do not have throw rugs and other things on the floor that can make you trip. What can I do with my stairs? Do not leave any items on the stairs. Make sure that there are handrails on both sides of the stairs and use them. Fix handrails that are broken or loose. Make sure that handrails are as long as the stairways. Check any carpeting to make sure that it is firmly attached to the stairs. Fix any carpet that is loose or worn. Avoid having throw rugs at the top or bottom of the stairs. If you do have throw rugs, attach them to the floor with carpet tape. Make sure that you have a light switch at the top of the stairs and the bottom of the stairs. If you do not have them, ask someone to add them for you. What else can I do to help prevent falls? Wear shoes that: Do not have high heels. Have rubber bottoms. Are comfortable and fit you well. Are closed at the toe. Do not wear sandals. If you use a stepladder: Make sure that it is fully opened. Do not climb a closed stepladder. Make sure that both sides of the stepladder are locked into place. Ask someone to hold it for you, if possible. Clearly mark and make sure that you can see: Any grab bars or handrails. First and last steps. Where the edge of each step is. Use tools that help you move around (mobility aids) if they are needed. These include: Canes. Walkers. Scooters. Crutches. Turn on the lights when you go into a dark area. Replace any light bulbs as soon as they burn out. Set up your furniture so you have a clear path. Avoid moving your furniture around. If any of your floors are uneven, fix them. If there are any pets around you, be aware of where they are. Review your medicines with your doctor. Some medicines can make you feel dizzy. This can increase your chance of falling. Ask your doctor what other things that you can do to help prevent falls. This  information is not intended to replace advice given to you by your health care provider. Make sure you discuss any questions you have with your health care provider. Document Released: 02/17/2009 Document Revised: 09/29/2015 Document Reviewed: 05/28/2014 Elsevier Interactive Patient Education  2017 [employer].

## 2021-07-10 NOTE — Progress Notes (Signed)
Subjective:   Rebecca Moreno is a 56 y.o. female who presents for Medicare Annual (Subsequent) preventive examination. Virtual Visit via Telephone Note  I connected with  [name] on 07/10/21 at  9:45 AM EST by telephone and verified that I am speaking with the correct person using two identifiers.  Location: Patient: HOME Provider: [facility] Persons participating in the virtual visit: patient/Nurse Health Advisor   I discussed the limitations, risks, security and privacy concerns of performing an evaluation and management service by telephone and the availability of in person appointments. The patient expressed understanding and agreed to proceed.  Interactive audio and video telecommunications were attempted between this nurse and patient, however failed, due to patient having technical difficulties OR patient did not have access to video capability.  We continued and completed visit with audio only.  Some vital signs may be absent or patient reported.   Chriss Driver, LPN  Review of Systems     Cardiac Risk Factors include: smoking/ tobacco exposure;sedentary lifestyle;Other (see comment) (CVA, Asthma, CKD), Risk factor comments: CVA, Asthma, CKD     Objective:    Today's Vitals   07/10/21 0944  Weight: 182 lb (82.6 kg)  Height: '5\' 6"'$  (1.676 m)   Body mass index is 29.38 kg/m.  Advanced Directives 07/10/2021 04/25/2021 01/25/2021 07/18/2020 01/14/2020 07/23/2019 05/27/2019  Does Patient Have a Medical Advance Directive? Yes No No Yes No Yes Yes  Type of Advance Directive Living will - - Living will;Healthcare Power of Attorney - Living will Healthcare Power of Attorney  Does patient want to make changes to medical advance directive? - - - No - Patient declined - No - Patient declined No - Patient declined  Copy of [place] in Chart? No - copy requested - - No - copy requested - - No - copy requested  Would patient like information on creating a  medical advance directive? - No - Patient declined No - Patient declined - No - Patient declined - -    Current Medications (verified) Outpatient Encounter Medications as of 07/10/2021  Medication Sig   famotidine (PEPCID) 20 MG tablet TAKE ONE TABLET BY MOUTH EVERY DAY   albuterol (VENTOLIN HFA) 108 (90 Base) MCG/ACT inhaler Inhale 1 puff into the lungs every 6 (six) hours as needed for wheezing or shortness of breath. (Patient not taking: Reported on 04/25/2021)   atorvastatin (LIPITOR) 80 MG tablet TAKE ONE TABLET AT BEDTIME   CALCIUM CITRATE-VITAMIN D3 PO Take 1 tablet by mouth daily.   Cariprazine HCl (VRAYLAR) 6 MG CAPS Take 1 capsule (6 mg total) by mouth daily.   Cholecalciferol (VITAMIN D3) 5000 units TABS Take 5,000 Units by mouth daily.    clonazePAM (KLONOPIN) 1 MG tablet Take 1 tablet (1 mg total) by mouth 2 (two) times daily as needed for up to 15 days for anxiety.   DEXILANT 30 MG capsule TAKE (1) CAPSULE DAILY   DHS SAL 3 % SHAM Apply topically every morning.   fluticasone (FLONASE) 50 MCG/ACT nasal spray SPRAY 2 SPRAYS INTO EACH NOSTRIL EVERY DAY   fluticasone (FLOVENT HFA) 220 MCG/ACT inhaler Inhale 2 puffs into the lungs in the morning and at bedtime.   gabapentin (NEURONTIN) 300 MG capsule Take 600 mg by mouth at bedtime.   lamoTRIgine (LAMICTAL) 100 MG tablet Take 3 tablets (300 mg total) by mouth at bedtime.   levothyroxine (SYNTHROID) 50 MCG tablet TAKE 1 TABLET DAILY   loratadine (CLARITIN) 10 MG  tablet TAKE 1 TABLET DAILY   magnesium gluconate (MAGONATE) 500 MG tablet Take 500 mg by mouth daily.   MODERNA COVID-19 BIVAL BOOSTER 50 MCG/0.5ML injection    Multiple Vitamins-Minerals (MULTIVITAMIN WOMEN PO) Take 1 tablet by mouth daily.   QUEtiapine (SEROQUEL) 300 MG tablet Take 1 tablet (300 mg total) by mouth at bedtime.   venlafaxine XR (EFFEXOR-XR) 150 MG 24 hr capsule Take 2 capsules (300 mg total) by mouth daily.   No facility-administered encounter medications on  file as of 07/10/2021.    Allergies (verified) Lithium   History: Past Medical History:  Diagnosis Date   Acute pyelonephritis    Asthma    Bipolar disorder ([place])    Colon polyp    Complication of anesthesia    "sometimes hard to put to sleep"   Depression    Bipolar   Gastric ulcer    around 2013.  stress related   GERD (gastroesophageal reflux disease)    History of kidney stones    Hyperlipidemia    Hypothyroid    Polyp, uterus corpus    Sepsis due to Escherichia coli (E. coli) ([place]) 09/04/2018   Sleep apnea    no cpap used   Stroke [facility])    Past Surgical History:  Procedure Laterality Date   ABLATION ON ENDOMETRIOSIS     COLONOSCOPY     Per patient, done around 2013 in [place], had polyp and overdue for follow up.   COLONOSCOPY WITH PROPOFOL N/A 05/12/2018   Procedure: COLONOSCOPY WITH PROPOFOL;  Surgeon: Daneil Dolin, MD;  Location: [facility];  Service: Endoscopy;  Laterality: N/A;  12:00pm   CYSTOSCOPY W/ URETERAL STENT PLACEMENT Left 09/01/2018   Procedure: CYSTOSCOPY WITH RETROGRADE PYELOGRAM/URETERAL STENT PLACEMENT;  Surgeon: Ceasar Mons, MD;  Location: [facility];  Service: Urology;  Laterality: Left;   CYSTOSCOPY/URETEROSCOPY/HOLMIUM LASER/STENT PLACEMENT Left 09/17/2018   Procedure: CYSTOSCOPY/URETEROSCOPY/HOLMIUM LASER/STENT PLACEMENT;  Surgeon: Ceasar Mons, MD;  Location: [facility];  Service: Urology;  Laterality: Left;  ONLY NEEDS 45 MIN   OVARIAN CYST SURGERY Left    POLYPECTOMY  05/12/2018   Procedure: POLYPECTOMY;  Surgeon: Daneil Dolin, MD;  Location: [facility];  Service: Endoscopy;;  polyp at splenic flexure   TEMPOROMANDIBULAR JOINT SURGERY     9 surgeries   TUBAL LIGATION     vaginal childbirth     1   Family History  Problem Relation Age of Onset   Anxiety disorder Mother    Hypertension Mother    Heart failure Mother    Stroke Mother    Hyperlipidemia Father    Diabetes Father    Stroke Father     Hypertension Father    Colon polyps Father        older than 32   Diabetes Sister    Hypertension Sister    Ovarian cancer Sister    Asthma Daughter    Ovarian cancer Maternal Grandmother    Colon cancer Maternal Grandmother    Renal Disease Paternal Grandmother    Heart attack Paternal Grandfather    Bipolar disorder Other    Social History   Socioeconomic History   Marital status: Married    Spouse name: Not on file   Number of children: 1   Years of education: Not on file   Highest education level: High school graduate  Occupational History   Occupation: disability  Tobacco Use   Smoking status: Every Day    Packs/day: 0.50  Years: 30.00    Pack years: 15.00    Types: Cigarettes   Smokeless tobacco: Never  Vaping Use   Vaping Use: Never used  Substance and Sexual Activity   Alcohol use: Never   Drug use: Not Currently   Sexual activity: Not Currently    Birth control/protection: Surgical    Comment: tubal  Other Topics Concern   Not on file  Social History Narrative   Not on file   Social Determinants of Health   Financial Resource Strain: High Risk   Difficulty of Paying Living Expenses: Hard  Food Insecurity: Food Insecurity Present   Worried About Running Out of Food in the Last Year: Often true   Ran Out of Food in the Last Year: Often true  Transportation Needs: No Transportation Needs   Lack of Transportation (Medical): No   Lack of Transportation (Non-Medical): No  Physical Activity: Insufficiently Active   Days of Exercise per Week: 3 days   Minutes of Exercise per Session: 20 min  Stress: No Stress Concern Present   Feeling of Stress : Not at all  Social Connections: Moderately Isolated   Frequency of Communication with Friends and Family: More than three times a week   Frequency of Social Gatherings with Friends and Family: More than three times a week   Attends Religious Services: Never   Marine scientist or Organizations: No    Attends Music therapist: Never   Marital Status: Married    Tobacco Counseling Ready to quit: Not Answered Counseling given: Not Answered   Clinical Intake:  Pre-visit preparation completed: Yes  Pain : No/denies pain     BMI - recorded: 29.38 Nutritional Status: BMI 25 -29 Overweight Nutritional Risks: None Diabetes: No  How often do you need to have someone help you when you read instructions, pamphlets, or other written materials from your doctor or pharmacy?: 1 - Never  Diabetic?no  Interpreter Needed?: No  Information entered by :: mj Artist Bloom, pn   Activities of Daily Living In your present state of health, do you have any difficulty performing the following activities: 07/10/2021  Hearing? N  Vision? N  Difficulty concentrating or making decisions? Y  Comment Memory at times  Walking or climbing stairs? N  Dressing or bathing? N  Doing errands, shopping? N  Preparing Food and eating ? N  Using the Toilet? N  In the past six months, have you accidently leaked urine? N  Do you have problems with loss of bowel control? N  Managing your Medications? N  Managing your Finances? N  Housekeeping or managing your Housekeeping? N  Some recent data might be hidden    Patient Care Team: [name], DO as PCP - General (Family Medicine) Norman Clay, MD as Consulting Physician (Psychiatry) Daneil Dolin, MD as Consulting Physician (Gastroenterology) Ceasar Mons, MD as Consulting Physician (Urology) Derek Jack, MD as Consulting Physician (Hematology)  Indicate any recent Medical Services you may have received from other than [facility] providers in the past year (date may be approximate).     Assessment:   This is a routine wellness examination for [place].  Hearing/Vision screen Hearing Screening - Comments:: No hearing issues.  Vision Screening - Comments:: Glasses. [facility]. 2022.  Dietary issues and  exercise activities discussed: Current Exercise Habits: Home exercise routine, Type of exercise: walking, Time (Minutes): 20, Frequency (Times/Week): 3, Weekly Exercise (Minutes/Week): 60, Intensity: Mild, Exercise limited by: cardiac condition(s);orthopedic condition(s);respiratory conditions(s)  Goals Addressed             This Visit's Progress    Exercise 150 min/wk Moderate Activity   Not on track    Walking is a great option. 07/10/21-Encouraged pt to move more.       Depression Screen PHQ 2/9 Scores 07/10/2021 02/20/2021 07/06/2020 06/29/2020 06/01/2020 12/24/2019 12/24/2019  PHQ - 2 Score 0 '4 1 2 3 1 '$ 0  PHQ- 9 Score - '9 5 4 12 4 '$ -  Some encounter information is confidential and restricted. Go to Review Flowsheets activity to see all data.    Fall Risk Fall Risk  07/10/2021 02/20/2021 06/29/2020 06/01/2020 12/24/2019  Falls in the past year? 0 0 0 0 1  Number falls in past yr: 0 - - - 0  Injury with Fall? 0 - - - 0  Risk for fall due to : No Fall Risks - - - History of fall(s)  Follow up Falls prevention discussed - - - Falls evaluation completed    FALL RISK PREVENTION PERTAINING TO THE HOME:  Any stairs in or around the home? Yes  If so, are there any without handrails? No  Home free of loose throw rugs in walkways, pet beds, electrical cords, etc? Yes  Adequate lighting in your home to reduce risk of falls? Yes   ASSISTIVE DEVICES UTILIZED TO PREVENT FALLS:  Life alert? No  Use of a cane, walker or w/c? No  Grab bars in the bathroom? Yes  Shower chair or bench in shower? No  Elevated toilet seat or a handicapped toilet? Yes   TIMED UP AND GO:  Was the test performed? No .  Phone visit.   Cognitive Function: MMSE - Mini Mental State Exam 07/22/2018  Orientation to time 5  Orientation to Place 5  Registration 3  Attention/ Calculation 5  Recall 3  Language- name 2 objects 2  Language- repeat 1  Language- follow 3 step command 3  Language- read & follow direction  1  Write a sentence 1  Copy design 1  Total score 30     6CIT Screen 07/10/2021 07/23/2019 07/23/2019  What Year? 0 points 0 points 0 points  What month? 0 points 0 points 0 points  What time? 0 points 0 points -  Count back from 20 0 points 0 points -  Months in reverse 2 points 0 points -  Repeat phrase 4 points 0 points -  Total Score 6 0 -    Immunizations Immunization History  Administered Date(s) Administered   Influenza Inj Mdck Quad Pf 02/03/2018   Influenza,inj,Quad PF,6+ Mos 05/27/2019, 02/29/2020   Influenza-Unspecified 03/03/2021   Moderna Covid-19 Vaccine Bivalent Booster 90yr & up 02/21/2021   Moderna Sars-Covid-2 Vaccination 07/27/2019, 08/24/2019, 04/22/2020, 09/27/2020   Pneumococcal Conjugate-13 01/06/2016   Pneumococcal Polysaccharide-23 02/13/2018   Tdap 01/06/2016    TDAP status: Up to date  Flu Vaccine status: Up to date  Pneumococcal vaccine status: Up to date  Covid-19 vaccine status: Completed vaccines  Qualifies for Shingles Vaccine? Yes   Zostavax completed No   Shingrix Completed?: No.    Education has been provided regarding the importance of this vaccine. Patient has been advised to call insurance company to determine out of pocket expense if they have not yet received this vaccine. Advised may also receive vaccine at local pharmacy or Health Dept. Verbalized acceptance and understanding.  Screening Tests Health Maintenance  Topic Date Due   Zoster Vaccines- Shingrix (1 of 2)  07/16/2022 (Originally 12/02/1984)   MAMMOGRAM  09/29/2021   PAP SMEAR-Modifier  09/30/2022   COLONOSCOPY (Pts 45-35yr Insurance coverage will need to be confirmed)  05/13/2023   TETANUS/TDAP  01/05/2026   INFLUENZA VACCINE  Completed   COVID-19 Vaccine  Completed   Hepatitis C Screening  Completed   HIV Screening  Completed   HPV VACCINES  Aged Out    Health Maintenance  There are no preventive care reminders to display for this patient.   Colorectal cancer  screening: Type of screening: Colonoscopy. Completed 05/12/2018. Repeat every 5 years  Mammogram status: Completed 09/30/2019. Repeat every year  Bone Density status: Ordered DUE AT AGE 56. Pt provided with contact info and advised to call to schedule appt.  Lung Cancer Screening: (Low Dose CT Chest recommended if Age 57-80years, 30 pack-year currently smoking OR have quit w/in 15years.) does qualify.   Lung Cancer Screening Referral: Pt declined referral at this time.   Additional Screening:  Hepatitis C Screening: does not qualify;  Vision Screening: Recommended annual ophthalmology exams for early detection of glaucoma and other disorders of the eye. Is the patient up to date with their annual eye exam?  Yes  Who is the provider or what is the name of the office in which the patient attends annual eye exams? [facility] If pt is not established with a provider, would they like to be referred to a provider to establish care? No .   Dental Screening: Recommended annual dental exams for proper oral hygiene  Community Resource Referral / Chronic Care Management: CRR required this visit?   Pt declined.  CCM required this visit?  No      Plan:     I have personally reviewed and noted the following in the patients chart:   Medical and social history Use of alcohol, tobacco or illicit drugs  Current medications and supplements including opioid prescriptions.  Functional ability and status Nutritional status Physical activity Advanced directives List of other physicians Hospitalizations, surgeries, and ER visits in previous 12 months Vitals Screenings to include cognitive, depression, and falls Referrals and appointments  In addition, I have reviewed and discussed with patient certain preventive protocols, quality metrics, and best practice recommendations. A written personalized care plan for preventive services as well as general preventive health recommendations were  provided to patient.     MChriss Driver LPN   31/07/863  Nurse Notes: Pt c/o issues with being able to afford her food. Offered CRR referral but pt declined. Pt states she is now currently receiving assistance. Declined repeat Mammogram. Discussed lung cancer screening but pt declined at this time.

## 2021-07-14 ENCOUNTER — Other Ambulatory Visit: Payer: Self-pay | Admitting: Psychiatry

## 2021-07-17 ENCOUNTER — Other Ambulatory Visit: Payer: Self-pay | Admitting: Psychiatry

## 2021-07-17 ENCOUNTER — Telehealth: Payer: Self-pay | Admitting: Psychiatry

## 2021-07-17 ENCOUNTER — Inpatient Hospital Stay (HOSPITAL_COMMUNITY): Payer: Medicare Other | Attending: Hematology

## 2021-07-17 DIAGNOSIS — R4701 Aphasia: Secondary | ICD-10-CM | POA: Insufficient documentation

## 2021-07-17 DIAGNOSIS — Z8041 Family history of malignant neoplasm of ovary: Secondary | ICD-10-CM | POA: Insufficient documentation

## 2021-07-17 DIAGNOSIS — D751 Secondary polycythemia: Secondary | ICD-10-CM | POA: Diagnosis not present

## 2021-07-17 DIAGNOSIS — K760 Fatty (change of) liver, not elsewhere classified: Secondary | ICD-10-CM | POA: Diagnosis not present

## 2021-07-17 DIAGNOSIS — N132 Hydronephrosis with renal and ureteral calculous obstruction: Secondary | ICD-10-CM | POA: Insufficient documentation

## 2021-07-17 DIAGNOSIS — G4730 Sleep apnea, unspecified: Secondary | ICD-10-CM | POA: Diagnosis not present

## 2021-07-17 DIAGNOSIS — Z800 Family history of malignant neoplasm of digestive organs: Secondary | ICD-10-CM | POA: Diagnosis not present

## 2021-07-17 DIAGNOSIS — F17210 Nicotine dependence, cigarettes, uncomplicated: Secondary | ICD-10-CM | POA: Diagnosis not present

## 2021-07-17 LAB — COMPREHENSIVE METABOLIC PANEL
ALT: 28 U/L (ref 0–44)
AST: 21 U/L (ref 15–41)
Albumin: 4.2 g/dL (ref 3.5–5.0)
Alkaline Phosphatase: 92 U/L (ref 38–126)
Anion gap: 9 (ref 5–15)
BUN: 19 mg/dL (ref 6–20)
CO2: 30 mmol/L (ref 22–32)
Calcium: 9.3 mg/dL (ref 8.9–10.3)
Chloride: 101 mmol/L (ref 98–111)
Creatinine, Ser: 0.95 mg/dL (ref 0.44–1.00)
GFR, Estimated: >60 mL/min (ref >60)
Glucose, Bld: 115 mg/dL — ABNORMAL HIGH (ref 70–99)
Potassium: 4.3 mmol/L (ref 3.5–5.1)
Sodium: 140 mmol/L (ref 135–145)
Total Bilirubin: 0.5 mg/dL (ref 0.3–1.2)
Total Protein: 6.7 g/dL (ref 6.5–8.1)

## 2021-07-17 LAB — CBC WITH DIFFERENTIAL/PLATELET
Abs Immature Granulocytes: 0.05 10*3/uL (ref 0.00–0.07)
Basophils Absolute: 0.1 10*3/uL (ref 0.0–0.1)
Basophils Relative: 1 %
Eosinophils Absolute: 0 10*3/uL (ref 0.0–0.5)
Eosinophils Relative: 0 %
HCT: 49.7 % — ABNORMAL HIGH (ref 36.0–46.0)
Hemoglobin: 16.4 g/dL — ABNORMAL HIGH (ref 12.0–15.0)
Immature Granulocytes: 1 %
Lymphocytes Relative: 17 %
Lymphs Abs: 1.7 10*3/uL (ref 0.7–4.0)
MCH: 35.7 pg — ABNORMAL HIGH (ref 26.0–34.0)
MCHC: 33 g/dL (ref 30.0–36.0)
MCV: 108 fL — ABNORMAL HIGH (ref 80.0–100.0)
Monocytes Absolute: 0.8 10*3/uL (ref 0.1–1.0)
Monocytes Relative: 8 %
Neutro Abs: 7.6 10*3/uL (ref 1.7–7.7)
Neutrophils Relative %: 73 %
Platelets: 190 10*3/uL (ref 150–400)
RBC: 4.6 MIL/uL (ref 3.87–5.11)
RDW: 14.2 % (ref 11.5–15.5)
WBC: 10.2 10*3/uL (ref 4.0–10.5)
nRBC: 0 % (ref 0.0–0.2)

## 2021-07-17 MED ORDER — CLONAZEPAM 1 MG PO TABS: 1.0000 mg | ORAL_TABLET | Freq: Two times a day (BID) | ORAL | 0 refills | Status: DC | PRN

## 2021-07-17 NOTE — Telephone Encounter (Signed)
Noted. Clonazepam was ordered.

## 2021-07-17 NOTE — Progress Notes (Unsigned)
BH MD/PA/NP OP Progress Note  07/17/2021 5:19 PM Rebecca Moreno  MRN:  [id number]  Chief Complaint: No chief complaint on file.  HPI: *** Visit Diagnosis: No diagnosis found.  Past Psychiatric History: Please see initial evaluation for full details. I have reviewed the history. No updates at this time.     Past Medical History:  Past Medical History:  Diagnosis Date   Acute pyelonephritis    Asthma    Bipolar disorder ([place])    Colon polyp    Complication of anesthesia    "sometimes hard to put to sleep"   Depression    Bipolar   Gastric ulcer    around 2013.  stress related   GERD (gastroesophageal reflux disease)    History of kidney stones    Hyperlipidemia    Hypothyroid    Polyp, uterus corpus    Sepsis due to Escherichia coli (E. coli) ([place]) 09/04/2018   Sleep apnea    no cpap used   Stroke [facility])     Past Surgical History:  Procedure Laterality Date   ABLATION ON ENDOMETRIOSIS     COLONOSCOPY     Per patient, done around 2013 in [place], had polyp and overdue for follow up.   COLONOSCOPY WITH PROPOFOL N/A 05/12/2018   Procedure: COLONOSCOPY WITH PROPOFOL;  Surgeon: Daneil Dolin, MD;  Location[facility];  Service: Endoscopy;  Laterality: N/A;  12:00pm   CYSTOSCOPY W/ URETERAL STENT PLACEMENT Left 09/01/2018   Procedure: CYSTOSCOPY WITH RETROGRADE PYELOGRAM/URETERAL STENT PLACEMENT;  Surgeon: Ceasar Mons, MD;  Location: [facility] ORS;  Service: Urology;  Laterality: Left;   CYSTOSCOPY/URETEROSCOPY/HOLMIUM LASER/STENT PLACEMENT Left 09/17/2018   Procedure: CYSTOSCOPY/URETEROSCOPY/HOLMIUM LASER/STENT PLACEMENT;  Surgeon: Ceasar Mons, MD;  Location: [facility] ORS;  Service: Urology;  Laterality: Left;  ONLY NEEDS 45 MIN   OVARIAN CYST SURGERY Left    POLYPECTOMY  05/12/2018   Procedure: POLYPECTOMY;  Surgeon: Daneil Dolin, MD;  Location: [facility];  Service: Endoscopy;;  polyp at splenic flexure   TEMPOROMANDIBULAR JOINT SURGERY     9  surgeries   TUBAL LIGATION     vaginal childbirth     1    Family Psychiatric History: Please see initial evaluation for full details. I have reviewed the history. No updates at this time.     Family History:  Family History  Problem Relation Age of Onset   Anxiety disorder Mother    Hypertension Mother    Heart failure Mother    Stroke Mother    Hyperlipidemia Father    Diabetes Father    Stroke Father    Hypertension Father    Colon polyps Father        older than 10   Diabetes Sister    Hypertension Sister    Ovarian cancer Sister    Asthma Daughter    Ovarian cancer Maternal Grandmother    Colon cancer Maternal Grandmother    Renal Disease Paternal Grandmother    Heart attack Paternal Grandfather    Bipolar disorder Other     Social History:  Social History   Socioeconomic History   Marital status: Married    Spouse name: Not on file   Number of children: 1   Years of education: Not on file   Highest education level: High school graduate  Occupational History   Occupation: disability  Tobacco Use   Smoking status: Every Day    Packs/day: 0.50    Years: 30.00  Pack years: 15.00    Types: Cigarettes   Smokeless tobacco: Never  Vaping Use   Vaping Use: Never used  Substance and Sexual Activity   Alcohol use: Never   Drug use: Not Currently   Sexual activity: Not Currently    Birth control/protection: Surgical    Comment: tubal  Other Topics Concern   Not on file  Social History Narrative   Not on file   Social Determinants of Health   Financial Resource Strain: High Risk   Difficulty of Paying Living Expenses: Hard  Food Insecurity: Food Insecurity Present   Worried About [place] in the Last Year: Often true   Ran Out of Food in the Last Year: Often true  Transportation Needs: No Transportation Needs   Lack of Transportation (Medical): No   Lack of Transportation (Non-Medical): No  Physical Activity: Insufficiently Active    Days of Exercise per Week: 3 days   Minutes of Exercise per Session: 20 min  Stress: No Stress Concern Present   Feeling of Stress : Not at all  Social Connections: Moderately Isolated   Frequency of Communication with Friends and Family: More than three times a week   Frequency of Social Gatherings with Friends and Family: More than three times a week   Attends Religious Services: Never   Marine scientist or Organizations: No   Attends Archivist Meetings: Never   Marital Status: Married    Allergies:  Allergies  Allergen Reactions   Lithium Other (See Comments)    Psoriasis     Metabolic Disorder Labs: Lab Results  Component Value Date   HGBA1C 5.2 02/20/2021   No results found for: PROLACTIN Lab Results  Component Value Date   CHOL 212 (H) 12/18/2018   TRIG 171 (H) 12/18/2018   HDL 44 12/18/2018   CHOLHDL 4.8 (H) 12/18/2018   LDLCALC 134 (H) 12/18/2018   LDLCALC 168 (H) 01/15/2018   Lab Results  Component Value Date   TSH 0.772 02/20/2021   TSH 0.912 07/06/2020    Therapeutic Level Labs: No results found for: LITHIUM No results found for: VALPROATE No components found for:  CBMZ  Current Medications: Current Outpatient Medications  Medication Sig Dispense Refill   famotidine (PEPCID) 20 MG tablet TAKE ONE TABLET BY MOUTH EVERY DAY 90 tablet 0   albuterol (VENTOLIN HFA) 108 (90 Base) MCG/ACT inhaler Inhale 1 puff into the lungs every 6 (six) hours as needed for wheezing or shortness of breath. (Patient not taking: Reported on 04/25/2021) 6.7 g 1   atorvastatin (LIPITOR) 80 MG tablet TAKE ONE TABLET AT BEDTIME 90 tablet 3   CALCIUM CITRATE-VITAMIN D3 PO Take 1 tablet by mouth daily.     Cariprazine HCl (VRAYLAR) 6 MG CAPS Take 1 capsule (6 mg total) by mouth daily. 90 capsule 1   Cholecalciferol (VITAMIN D3) 5000 units TABS Take 5,000 Units by mouth daily.      clonazePAM (KLONOPIN) 1 MG tablet Take 1 tablet (1 mg total) by mouth 2 (two) times  daily as needed for up to 15 days for anxiety. 30 tablet 0   DEXILANT 30 MG capsule TAKE (1) CAPSULE DAILY 90 capsule 3   DHS SAL 3 % SHAM Apply topically every morning.     fluticasone (FLONASE) 50 MCG/ACT nasal spray SPRAY 2 SPRAYS INTO EACH NOSTRIL EVERY DAY 48 mL 0   fluticasone (FLOVENT HFA) 220 MCG/ACT inhaler Inhale 2 puffs into the lungs in the morning and  at bedtime. 1 each 12   gabapentin (NEURONTIN) 300 MG capsule Take 600 mg by mouth at bedtime.     lamoTRIgine (LAMICTAL) 100 MG tablet Take 3 tablets (300 mg total) by mouth at bedtime. 270 tablet 1   levothyroxine (SYNTHROID) 50 MCG tablet TAKE 1 TABLET DAILY 90 tablet 1   loratadine (CLARITIN) 10 MG tablet TAKE 1 TABLET DAILY 30 tablet 11   magnesium gluconate (MAGONATE) 500 MG tablet Take 500 mg by mouth daily.     MODERNA COVID-19 BIVAL BOOSTER 50 MCG/0.5ML injection      Multiple Vitamins-Minerals (MULTIVITAMIN WOMEN PO) Take 1 tablet by mouth daily.     QUEtiapine (SEROQUEL) 300 MG tablet Take 1 tablet (300 mg total) by mouth at bedtime. 90 tablet 1   venlafaxine XR (EFFEXOR-XR) 150 MG 24 hr capsule Take 2 capsules (300 mg total) by mouth daily. 180 capsule 1   No current facility-administered medications for this visit.     Musculoskeletal: Strength & Muscle Tone:  N/A Gait & Station:  N/A Patient leans: N/A  Psychiatric Specialty Exam: Review of Systems  Last menstrual period 12/13/2017.There is no height or weight on file to calculate BMI.  General Appearance: {Appearance:22683}  Eye Contact:  {BHH EYE CONTACT:22684}  Speech:  Clear and Coherent  Volume:  Normal  Mood:  {BHH MOOD:22306}  Affect:  {Affect (PAA):22687}  Thought Process:  Coherent  Orientation:  Full (Time, Place, and Person)  Thought Content: Logical   Suicidal Thoughts:  {ST/HT (PAA):22692}  Homicidal Thoughts:  {ST/HT (PAA):22692}  Memory:  Immediate;   Good  Judgement:  {Judgement (PAA):22694}  Insight:  {Insight (PAA):22695}  Psychomotor  Activity:  Normal  Concentration:  Concentration: Good and Attention Span: Good  Recall:  Good  Fund of Knowledge: Good  Language: Good  Akathisia:   N/A  Handed:  Right  AIMS (if indicated): not done  Assets:  Communication Skills Desire for Improvement  ADL's:  Intact  Cognition: WNL  Sleep:  {BHH GOOD/FAIR/POOR:22877}   Screenings: GAD-7    Flowsheet Row Office Visit from 02/20/2021 in [place] Visit from 06/29/2020 in [place] Visit from 08/19/2019 in [place] Visit from 04/16/2018 in [place]  Total GAD-7 Score '6 10 11 3      '$ Mini-Mental    [place] from 07/22/2018 in [place]  Total Score (max 30 points ) 30      PHQ2-9    Flowsheet Row Clinical Support from 07/10/2021 in [place] Office Visit from 02/20/2021 in [place] Video Visit from 02/08/2021 in [place] Video Visit from 08/18/2020 in [place] Office Visit from 07/06/2020 in [place]  PHQ-2 Total Score 0 '4 3 2 1  '$ PHQ-9 Total Score -- '9 9 4 5      '$ Flowsheet Row Video Visit from 11/15/2020 in [place] Video Visit from 08/18/2020 in [place] No Risk No Risk        Assessment and Plan:  CHEMIKA NIGHTENGALE is a 56 y.o. year old female with a history of epression, hypothyroidism, who presents for follow up appointment for below.      1. MDD (major depressive disorder), recurrent episode, mild ([place]) # Bipolar I disorder by history Although she continues to report depressive symptoms and anxiety, she has been handling  things relatively well since her last visit.  Psychosocial stressors includes taking care of her mother-in-law,  marital conflict.  She has strong preference to stay on the current medication (which was originally prescribed by other provider) despite being provided psychoeducation regarding polypharmacy.  Will continue venlafaxine to target depression.  Will continue lamotrigine, Vraylar, and quetiapine to target mood dysregulation.  Will continue clonazepam as needed for anxiety. Noted that although the patient was reportedly diagnosed with bipolar I disorder in the past, both the patient and her husband denies any manic/hypomanic episode except irritability, paranoia in the context of depression.       This clinician has discussed the side effect associated with medication prescribed during this encounter. Please refer to notes in the previous encounters for more details.     Plan I have reviewed and updated plans as below  1. Continue Venlafaxine 300 mg daily 2. Continue Lamotrigine 300 mg daily 3. Continue Vraylar 6 mg 4. Continue Quetiapine 300 mg at night 5. Continue Clonazepam 1 mg twice a day as needed for anxiety  6. Next appointment: 1/4 at 1 PM, phone. She declined in person visit due to location - She has an upcoming appointment with her PCP in Oct. She was advised to obtain lipid panels/glucose at the next visit    This clinician has discussed the side effect associated with medication prescribed during this encounter. Please refer to notes in the previous encounters for more details.       Past trials of medication: sertraline, fluoxetine, lexapro, Celexa, duloxetine, Wellbutrin  lithium (psoriasis), Depakote (did not work), olanzapine, Abilify (weight gain), latuda, Geodon    The patient demonstrates the following risk factors for suicide: Chronic risk factors for suicide include: psychiatric disorder of depression. Acute risk factors for suicide include: family or marital conflict and unemployment. Protective factors for this patient include: positive social support and hope for the future.  Considering these factors, the overall suicide risk at this point appears to be low. Patient is appropriate for outpatient follow up.      Collaboration of Care: Collaboration of Care: {BH OP Collaboration of Care:21014065}  Patient/Guardian was advised Release of Information must be obtained prior to any record release in order to collaborate their care with an outside provider. Patient/Guardian was advised if they have not already done so to contact the registration department to sign all necessary forms in order for [place] to release information regarding their care.   Consent: Patient/Guardian gives verbal consent for treatment and assignment of benefits for services provided during this visit. Patient/Guardian expressed understanding and agreed to proceed.    Norman Clay, MD 07/17/2021, 5:19 PM

## 2021-07-17 NOTE — Telephone Encounter (Signed)
Please return message from pt. Re: meds needing refill. Thx. ?

## 2021-07-17 NOTE — Telephone Encounter (Signed)
Pt was made an appt to a virtual appt.  ?

## 2021-07-17 NOTE — Telephone Encounter (Signed)
Could you contact the patient- she has had no shows, and I have not seen her since last Oct. Please make sure that she has a follow up appointment. Please also discuss attendance policy.

## 2021-07-18 ENCOUNTER — Other Ambulatory Visit: Payer: Self-pay | Admitting: Psychiatry

## 2021-07-19 ENCOUNTER — Encounter: Payer: Self-pay | Admitting: Psychiatry

## 2021-07-19 ENCOUNTER — Other Ambulatory Visit: Payer: Self-pay

## 2021-07-19 ENCOUNTER — Telehealth (INDEPENDENT_AMBULATORY_CARE_PROVIDER_SITE_OTHER): Payer: Medicare Other | Admitting: Psychiatry

## 2021-07-19 DIAGNOSIS — F3342 Major depressive disorder, recurrent, in full remission: Secondary | ICD-10-CM

## 2021-07-19 MED ORDER — VRAYLAR 6 MG PO CAPS: 1.0000 | ORAL_CAPSULE | Freq: Every day | ORAL | 0 refills | Status: DC

## 2021-07-19 MED ORDER — QUETIAPINE FUMARATE 300 MG PO TABS
300.0000 mg | ORAL_TABLET | Freq: Every day | ORAL | 0 refills | Status: DC
Start: 2021-08-15 — End: 2022-01-17

## 2021-07-19 MED ORDER — LAMOTRIGINE 100 MG PO TABS: 300.0000 mg | ORAL_TABLET | Freq: Every day | ORAL | 0 refills | Status: DC

## 2021-07-19 MED ORDER — CLONAZEPAM 1 MG PO TABS: 1.0000 mg | ORAL_TABLET | Freq: Two times a day (BID) | ORAL | 1 refills | Status: DC | PRN

## 2021-07-19 MED ORDER — VENLAFAXINE HCL ER 150 MG PO CP24: 300.0000 mg | ORAL_CAPSULE | Freq: Every day | ORAL | 0 refills | Status: DC

## 2021-07-20 ENCOUNTER — Other Ambulatory Visit: Payer: Self-pay | Admitting: Family Medicine

## 2021-07-24 ENCOUNTER — Inpatient Hospital Stay (HOSPITAL_BASED_OUTPATIENT_CLINIC_OR_DEPARTMENT_OTHER): Payer: Medicare Other | Admitting: Hematology

## 2021-07-24 ENCOUNTER — Inpatient Hospital Stay (HOSPITAL_COMMUNITY): Payer: Medicare Other

## 2021-07-24 ENCOUNTER — Other Ambulatory Visit: Payer: Self-pay

## 2021-07-24 VITALS — BP 122/75 | HR 85 | Temp 97.9°F | Resp 20 | Ht 66.54 in | Wt 201.9 lb

## 2021-07-24 DIAGNOSIS — D751 Secondary polycythemia: Secondary | ICD-10-CM

## 2021-07-24 DIAGNOSIS — F17210 Nicotine dependence, cigarettes, uncomplicated: Secondary | ICD-10-CM | POA: Diagnosis not present

## 2021-07-24 DIAGNOSIS — N132 Hydronephrosis with renal and ureteral calculous obstruction: Secondary | ICD-10-CM | POA: Diagnosis not present

## 2021-07-24 DIAGNOSIS — R4701 Aphasia: Secondary | ICD-10-CM | POA: Diagnosis not present

## 2021-07-24 DIAGNOSIS — K760 Fatty (change of) liver, not elsewhere classified: Secondary | ICD-10-CM | POA: Diagnosis not present

## 2021-07-24 DIAGNOSIS — G4730 Sleep apnea, unspecified: Secondary | ICD-10-CM | POA: Diagnosis not present

## 2021-07-24 DIAGNOSIS — Z800 Family history of malignant neoplasm of digestive organs: Secondary | ICD-10-CM | POA: Diagnosis not present

## 2021-07-24 NOTE — Progress Notes (Signed)
? ?[facility] ?[address] ?[place], [place] [address] ? ? ?CLINIC:  ?Medical Oncology/Hematology ? ?PCP:  ?Rebecca Norlander, DO ?[address] [place] [address]  ?[phone] ? 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 ?PRIOR THERAPY: none ? ?CURRENT THERAPY: Intermittent phlebotomy on 02/05/2019 ? ?INTERVAL HISTORY:  ?Rebecca Moreno, a 56 y.o. 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 Rebecca Moreno was last seen on 01/25/2021. ? ?Today she reports feeling good, and she is accompanied by her daughter. Following the phlebotomy she requires naps less frequently. She continues to take aspirin 325 mg. She denies itching after showers and changing colors of fingertips. Her appetite is good. She smokes 12-15 cigarettes daily.  ? ?REVIEW OF SYSTEMS:  ?Review of Systems  ?Constitutional:  Negative for appetite change and fatigue.  ?Skin:  Negative for itching.  ?Psychiatric/Behavioral:  Negative for sleep disturbance.   ?All other systems reviewed and are negative. ? ?PAST MEDICAL/SURGICAL HISTORY:  ?Past Medical History:  ?Diagnosis Date  ? Acute pyelonephritis   ? Asthma   ? Bipolar disorder ([place])   ? Colon polyp   ? Complication of anesthesia   ? "sometimes hard to put to sleep"  ? Depression   ? Bipolar  ? Gastric ulcer   ? around 2013.  stress related  ? GERD (gastroesophageal reflux disease)   ? History of kidney stones   ? Hyperlipidemia   ? Hypothyroid   ? Polyp, uterus corpus   ? Sepsis due to Escherichia coli (E. coli) ([place]) 09/04/2018  ? Sleep apnea   ? no cpap used  ? Stroke [facility])   ? ?Past Surgical History:  ?Procedure Laterality Date  ? ABLATION ON ENDOMETRIOSIS    ? COLONOSCOPY    ? Per patient, done around 2013 in [place], had polyp and overdue for follow up.  ? COLONOSCOPY WITH PROPOFOL N/A 05/12/2018  ? Procedure: COLONOSCOPY WITH PROPOFOL;  Surgeon: Daneil Dolin, MD;  [facility];  Service: Endoscopy;   Laterality: N/A;  12:00pm  ? CYSTOSCOPY W/ URETERAL STENT PLACEMENT Left 09/01/2018  ? Procedure: CYSTOSCOPY WITH RETROGRADE PYELOGRAM/URETERAL STENT PLACEMENT;  Surgeon: Ceasar Mons, MD;  Location[facility];  Service: Urology;  Laterality: Left;  ? CYSTOSCOPY/URETEROSCOPY/HOLMIUM LASER/STENT PLACEMENT Left 09/17/2018  ? Procedure: CYSTOSCOPY/URETEROSCOPY/HOLMIUM LASER/STENT PLACEMENT;  Surgeon: Ceasar Mons, MD;  Location[facility];  Service: Urology;  Laterality: Left;  ONLY NEEDS 45 MIN  ? OVARIAN CYST SURGERY Left   ? POLYPECTOMY  05/12/2018  ? Procedure: POLYPECTOMY;  Surgeon: Daneil Dolin, MD;  [facility];  Service: Endoscopy;;  polyp at splenic flexure  ? TEMPOROMANDIBULAR JOINT SURGERY    ? 9 surgeries  ? TUBAL LIGATION    ? vaginal childbirth    ? 1  ? ? ?SOCIAL HISTORY:  ?Social History  ? ?Socioeconomic History  ? Marital status: Married  ?  Spouse name: Not on file  ? Number of children: 1  ? Years of education: Not on file  ? Highest education level: High school graduate  ?Occupational History  ? Occupation: disability  ?Tobacco Use  ? Smoking status: Every Day  ?  Packs/day: 0.50  ?  Years: 30.00  ?  Pack years: 15.00  ?  Types: Cigarettes  ? Smokeless tobacco: Never  ?Vaping Use  ? Vaping Use: Never used  ?Substance and Sexual Activity  ? Alcohol use: Never  ? Drug  use: Not Currently  ? Sexual activity: Not Currently  ?  Birth control/protection: Surgical  ?  Comment: tubal  ?Other Topics Concern  ? Not on file  ?Social History Narrative  ? Not on file  ? ?Social Determinants of Health  ? ?Financial Resource Strain: High Risk  ? Difficulty of Paying Living Expenses: Hard  ?Food Insecurity: Food Insecurity Present  ? Worried About Charity fundraiser in the Last Year: Often true  ? Ran Out of Food in the Last Year: Often true  ?Transportation Needs: No Transportation Needs  ? Lack of Transportation (Medical): No  ? Lack of Transportation (Non-Medical): No  ?Physical  Activity: Insufficiently Active  ? Days of Exercise per Week: 3 days  ? Minutes of Exercise per Session: 20 min  ?Stress: No Stress Concern Present  ? Feeling of Stress : Not at all  ?Social Connections: Moderately Isolated  ? Frequency of Communication with Friends and Family: More than three times a week  ? Frequency of Social Gatherings with Friends and Family: More than three times a week  ? Attends Religious Services: Never  ? Active Member of Clubs or Organizations: No  ? Attends Archivist Meetings: Never  ? Marital Status: Married  ?Intimate Partner Violence: Not At Risk  ? Fear of Current or Ex-Partner: No  ? Emotionally Abused: No  ? Physically Abused: No  ? Sexually Abused: No  ? ? ?FAMILY HISTORY:  ?Family History  ?Problem Relation Age of Onset  ? Anxiety disorder Mother   ? Hypertension Mother   ? Heart failure Mother   ? Stroke Mother   ? Hyperlipidemia Father   ? Diabetes Father   ? Stroke Father   ? Hypertension Father   ? Colon polyps Father   ?     older than 34  ? Diabetes Sister   ? Hypertension Sister   ? Ovarian cancer Sister   ? Asthma Daughter   ? Ovarian cancer Maternal Grandmother   ? Colon cancer Maternal Grandmother   ? Renal Disease Paternal Grandmother   ? Heart attack Paternal Grandfather   ? Bipolar disorder Other   ? ? ?CURRENT MEDICATIONS:  ?Current Outpatient Medications  ?Medication Sig Dispense Refill  ? albuterol (VENTOLIN HFA) 108 (90 Base) MCG/ACT inhaler Inhale 1 puff into the lungs every 6 (six) hours as needed for wheezing or shortness of breath. 6.7 g 1  ? atorvastatin (LIPITOR) 80 MG tablet TAKE ONE TABLET AT BEDTIME 90 tablet 0  ? CALCIUM CITRATE-VITAMIN D3 PO Take 1 tablet by mouth daily.    ? [START ON 08/08/2021] Cariprazine HCl (VRAYLAR) 6 MG CAPS Take 1 capsule (6 mg total) by mouth daily. 90 capsule 0  ? Cholecalciferol (VITAMIN D3) 5000 units TABS Take 5,000 Units by mouth daily.     ? clonazePAM (KLONOPIN) 1 MG tablet Take 1 tablet (1 mg total) by mouth  2 (two) times daily as needed for anxiety. 60 tablet 1  ? DEXILANT 30 MG capsule DR TAKE (1) CAPSULE DAILY 90 capsule 0  ? DHS SAL 3 % SHAM Apply topically every morning.    ? famotidine (PEPCID) 20 MG tablet TAKE ONE TABLET BY MOUTH EVERY DAY 90 tablet 0  ? fluticasone (FLONASE) 50 MCG/ACT nasal spray SPRAY 2 SPRAYS INTO EACH NOSTRIL EVERY DAY 48 mL 0  ? fluticasone (FLOVENT HFA) 220 MCG/ACT inhaler Inhale 2 puffs into the lungs in the morning and at bedtime. 1 each 12  ? gabapentin (  NEURONTIN) 300 MG capsule Take 600 mg by mouth at bedtime.    ? [START ON 08/15/2021] lamoTRIgine (LAMICTAL) 100 MG tablet Take 3 tablets (300 mg total) by mouth at bedtime. 270 tablet 0  ? levothyroxine (SYNTHROID) 50 MCG tablet TAKE 1 TABLET DAILY 90 tablet 1  ? loratadine (CLARITIN) 10 MG tablet TAKE 1 TABLET DAILY 30 tablet 11  ? magnesium gluconate (MAGONATE) 500 MG tablet Take 500 mg by mouth daily.    ? MODERNA COVID-19 BIVAL BOOSTER 50 MCG/0.5ML injection     ? Multiple Vitamins-Minerals (MULTIVITAMIN WOMEN PO) Take 1 tablet by mouth daily.    ? [START ON 08/15/2021] QUEtiapine (SEROQUEL) 300 MG tablet Take 1 tablet (300 mg total) by mouth at bedtime. 90 tablet 0  ? [START ON 08/15/2021] venlafaxine XR (EFFEXOR-XR) 150 MG 24 hr capsule Take 2 capsules (300 mg total) by mouth daily. 180 capsule 0  ? ?No current facility-administered medications for this visit.  ? ? ?ALLERGIES:  ?Allergies  ?Allergen Reactions  ? Lithium Other (See Comments)  ?  Psoriasis   ? ? ?PHYSICAL EXAM:  ?Performance status (ECOG): 1 - Symptomatic but completely ambulatory ? ?Vitals:  ? 07/24/21 1319  ?BP: 122/75  ?Pulse: 85  ?Resp: 20  ?Temp: 97.9 ?F (36.6 ?C)  ?SpO2: 91%  ? ?Wt Readings from Last 3 Encounters:  ?07/24/21 201 lb 14.4 oz (91.6 kg)  ?07/10/21 182 lb (82.6 kg)  ?02/20/21 182 lb (82.6 kg)  ? ?Physical Exam ?Vitals reviewed.  ?Constitutional:   ?   Appearance: Normal appearance. She is obese.  ?Cardiovascular:  ?   Rate and Rhythm: Normal rate  and regular rhythm.  ?   Pulses: Normal pulses.  ?   Heart sounds: Normal heart sounds.  ?Pulmonary:  ?   Effort: Pulmonary effort is normal.  ?   Breath sounds: Normal breath sounds.  ?Abdominal:  ?   Palpations:

## 2021-07-24 NOTE — Progress Notes (Signed)
Patient presents today for phlebotomy per MD orders. ?Phlebotomy procedure started at 1428 and ended at 1440. ?500 cc removed. ?Patient tolerated procedure well. ?Procedure tolerated well and without incident. Discharged ambulatory in stable condition.   ?

## 2021-07-24 NOTE — Patient Instructions (Signed)
[facility]  Discharge Instructions: ?Thank you for choosing [facility] to provide your oncology and hematology care.  ?If you have a lab appointment with the [facility], please come in thru the Main Entrance and check in at the main information desk. ? ?Wear comfortable clothing and clothing appropriate for easy access to any Portacath or PICC line.  ? ?We strive to give you quality time with your provider. You may need to reschedule your appointment if you arrive late (15 or more minutes).  Arriving late affects you and other patients whose appointments are after yours.  Also, if you miss three or more appointments without notifying the office, you may be dismissed from the clinic at the provider?s discretion.    ?  ?For prescription refill requests, have your pharmacy contact our office and allow 72 hours for refills to be completed.   ? ?Today you received the following chemotherapy and/or immunotherapy agents phlebotomy    ?  ?To help prevent nausea and vomiting after your treatment, we encourage you to take your nausea medication as directed. ? ?BELOW ARE SYMPTOMS THAT SHOULD BE REPORTED IMMEDIATELY: ?*FEVER GREATER THAN 100.4 F (38 ?C) OR HIGHER ?*CHILLS OR SWEATING ?*NAUSEA AND VOMITING THAT IS NOT CONTROLLED WITH YOUR NAUSEA MEDICATION ?*UNUSUAL SHORTNESS OF BREATH ?*UNUSUAL BRUISING OR BLEEDING ?*URINARY PROBLEMS (pain or burning when urinating, or frequent urination) ?*BOWEL PROBLEMS (unusual diarrhea, constipation, pain near the anus) ?TENDERNESS IN MOUTH AND THROAT WITH OR WITHOUT PRESENCE OF ULCERS (sore throat, sores in mouth, or a toothache) ?UNUSUAL RASH, SWELLING OR PAIN  ?UNUSUAL VAGINAL DISCHARGE OR ITCHING  ? ?Items with * indicate a potential emergency and should be followed up as soon as possible or go to the Emergency Department if any problems should occur. ? ?Please show the CHEMOTHERAPY ALERT CARD or IMMUNOTHERAPY ALERT CARD at check-in to the Emergency  Department and triage nurse. ? ?Should you have questions after your visit or need to cancel or reschedule your appointment, please contact [facility] [phone]  and follow the prompts.  Office hours are 8:00 a.m. to 4:30 p.m. Monday - Friday. Please note that voicemails left after 4:00 p.m. may not be returned until the following business day.  We are closed weekends and major holidays. You have access to a nurse at all times for urgent questions. Please call the main number to the clinic [phone] and follow the prompts. ? ?For any non-urgent questions, you may also contact your provider using MyChart. We now offer e-Visits for anyone 71 and older to request care online for non-urgent symptoms. For details visit [email or web address]. ?  ?Also download the MyChart app! Go to the app store, search "MyChart", open the app, select [facility], and log in with your MyChart username and password. ? ?Due to Covid, a mask is required upon entering the hospital/clinic. If you do not have a mask, one will be given to you upon arrival. For doctor visits, patients may have 1 support person aged 39 or older with them. For treatment visits, patients cannot have anyone with them due to current Covid guidelines and our immunocompromised population.  ?

## 2021-07-24 NOTE — Patient Instructions (Signed)
[place] at [facility] ?Discharge Instructions ? ?You were seen and examined today by Dr. Delton Coombes. He reviewed your most recent labs and recommends that you have Phlebotomies every two months. Please keep follow up appointments as scheduled. ? ? ?Thank you for choosing [place] at [facility] to provide your oncology and hematology care.  To afford each patient quality time with our provider, please arrive at least 15 minutes before your scheduled appointment time.  ? ?If you have a lab appointment with the [facility] please come in thru the Main Entrance and check in at the main information desk. ? ?You need to re-schedule your appointment should you arrive 10 or more minutes late.  We strive to give you quality time with our providers, and arriving late affects you and other patients whose appointments are after yours.  Also, if you no show three or more times for appointments you may be dismissed from the clinic at the providers discretion.     ?Again, thank you for choosing [facility].  Our hope is that these requests will decrease the amount of time that you wait before being seen by our physicians.       ?_____________________________________________________________ ? ?Should you have questions after your visit to [facility], please contact our office at [phone] and follow the prompts.  Our office hours are 8:00 a.m. and 4:30 p.m. Monday - Friday.  Please note that voicemails left after 4:00 p.m. may not be returned until the following business day.  We are closed weekends and major holidays.  You do have access to a nurse 24-7, just call the main number to the clinic [phone] and do not press any options, hold on the line and a nurse will answer the phone.   ? ?For prescription refill requests, have your pharmacy contact our office and allow 72 hours.   ? ?Due to Covid, you will need to wear a mask upon entering  the hospital. If you do not have a mask, a mask will be given to you at the Main Entrance upon arrival. For doctor visits, patients may have 1 support person age 48 or older with them. For treatment visits, patients can not have anyone with them due to social distancing guidelines and our immunocompromised population.  ? ?  ?

## 2021-07-28 NOTE — Telephone Encounter (Signed)
Pt has appt 09-11-21 ?

## 2021-08-09 ENCOUNTER — Other Ambulatory Visit: Payer: Self-pay | Admitting: Family Medicine

## 2021-08-10 DIAGNOSIS — J449 Chronic obstructive pulmonary disease, unspecified: Secondary | ICD-10-CM | POA: Diagnosis not present

## 2021-08-10 DIAGNOSIS — G4733 Obstructive sleep apnea (adult) (pediatric): Secondary | ICD-10-CM | POA: Diagnosis not present

## 2021-08-10 DIAGNOSIS — R0902 Hypoxemia: Secondary | ICD-10-CM | POA: Diagnosis not present

## 2021-08-17 ENCOUNTER — Other Ambulatory Visit: Payer: Self-pay | Admitting: Family Medicine

## 2021-08-28 ENCOUNTER — Encounter: Payer: Self-pay | Admitting: Family Medicine

## 2021-08-28 ENCOUNTER — Ambulatory Visit (INDEPENDENT_AMBULATORY_CARE_PROVIDER_SITE_OTHER): Payer: Medicare Other | Admitting: Family Medicine

## 2021-08-28 VITALS — BP 122/81 | HR 76 | Temp 98.2°F | Ht 66.0 in | Wt 199.2 lb

## 2021-08-28 DIAGNOSIS — F0630 Mood disorder due to known physiological condition, unspecified: Secondary | ICD-10-CM

## 2021-08-28 DIAGNOSIS — R413 Other amnesia: Secondary | ICD-10-CM | POA: Diagnosis not present

## 2021-08-28 DIAGNOSIS — Z8673 Personal history of transient ischemic attack (TIA), and cerebral infarction without residual deficits: Secondary | ICD-10-CM | POA: Diagnosis not present

## 2021-08-28 NOTE — Patient Instructions (Signed)
Checking some labs that would impact memory.  ?We did a memory test today. Sometimes strokes can cause something called vascular dementia ? ?Vascular Dementia ?Dementia is a condition in which a person has problems with thinking, memory, and behavior that are severe enough to interfere with daily life. Vascular dementia is a type of dementia. It results from brain damage that is caused by the brain not getting enough blood. This condition may also be called vascular cognitive impairment. ?What are the causes? ?Vascular dementia is caused by conditions that reduce blood flow to the brain. Common causes of this condition include: ?Multiple small strokes. These may happen without symptoms (silent stroke). ?Major stroke. ?Damage to small blood vessels in the brain (cerebral small vessel disease). ?What increases the risk? ?The following factors may make you more likely to develop this condition: ?Having had a stroke. ?Having high blood pressure (hypertension) or high cholesterol. ?Having a disease that affects the heart or blood vessels. ?Smoking. ?Not being active. ?Being over age 60. ?Having any of these conditions: ?Diabetes. ?Metabolic syndrome. ?Obesity. ?Depression. ?A genetic condition that leads to stroke, such as CADASIL (cerebral autosomal dominant arteriopathy with subcortical infarcts and leukoencephalopathy). ?What are the signs or symptoms? ?Symptoms of vascular dementia can vary from one person to another. Symptoms may be mild or severe depending on the amount of damage and which parts of the brain have been affected. Symptoms may begin suddenly or may develop slowly. ?Mental symptoms may include: ?Confusion and memory problems. ?Poor attention and concentration. ?Trouble understanding speech. ?Depression. ?Personality changes. ?Trouble recognizing familiar people. ?Agitation or aggression. ?Paranoia or false beliefs (delusions). ?Hallucinations. These involve seeing, hearing, tasting, smelling, or  feeling things that are not real. ?Physical symptoms may include: ?Weakness. ?Poor balance. ?Loss of bladder or bowel control (incontinence). ?Unsteady walking (gait). ?Speaking problems. ?Behavioral symptoms may include: ?Getting lost in familiar places. ?Problems with planning and judgment. ?Trouble following instructions. ?Social problems. ?Emotional outbursts. ?Trouble with daily activities and self-care. ?Problems handling money. ?Symptoms may remain stable, or they may get worse over time. Symptoms of vascular dementia may be similar to those of Alzheimer's disease. The two conditions can occur together (mixed dementia). ?How is this diagnosed? ?This condition may be diagnosed based on: ?Your medical history and a physical exam. ?Symptoms or changes that are reported by friends and family. ?Lab tests or other tests that check brain and nervous system function. Tests may include: ?Blood tests. ?Brain imaging tests. ?Tests of movement, speech, and other daily activities (neurological exam). ?Tests of memory, thinking, and problem-solving (neuropsychological or neurocognitive testing). ?There is not a specific test to diagnose vascular dementia. Diagnosis may involve several specialists. These may include: ?A health care provider who specializes in the brain and nervous system (neurologist). ?A health care provider who specializes in understanding how problems in the brain can alter behavior and cognitive function (neuropsychologist). ?How is this treated? ?There is no cure for vascular dementia. Brain damage that has already occurred cannot be reversed. Treatment depends on: ?How severe the condition is. ?Which parts of your brain have been affected. ?Your overall health. ?Treatment measures aim to: ?Treat the underlying cause of vascular dementia and manage risk factors. This may include: ?Controlling blood pressure or lowering cholesterol. ?Treating diabetes. ?Making lifestyle changes, such as quitting smoking  or losing weight. ?Manage symptoms. ?Prevent further brain damage. ?Improve the person's health and quality of life. ?Treatment for dementia may involve a team of health care providers, including: ?A neurologist. ?A provider who specializes  in disorders of the mind (psychiatrist). ?A provider who specializes in helping people learn daily living skills (occupational therapist). ?A provider who focuses on speech and language changes (Electrical engineer). ?A heart specialist (cardiologist). ?A provider who helps people learn how to manage physical changes, such as movement and walking (exercise physiologist or physical therapist). ?Follow these instructions at home: ?Medicines ?Take over-the-counter and prescription medicines only as told by your health care provider. ?Use a pill organizer or pill reminder to help you manage your medicines. ?Lifestyle ?Do not use any products that contain nicotine or tobacco, such as cigarettes, e-cigarettes, and chewing tobacco. If you need help quitting, ask your health care provider. ?Eat a healthy, balanced diet. ?Maintain a healthy weight, or lose weight if needed. ?Be physically active as told by your health care provider. ?General instructions ?Work with your health care provider to determine what you need help with and what your safety needs are. ?Follow the health care provider's instructions for treating the condition that caused the dementia. ?Keep all follow-up visits. This is important. ?If you are the caregiver: ? ?People with vascular dementia may need regular help at home or daily care from a family member or home health care worker. ?Home care for a person with vascular dementia depends on what caused the condition and how severe the symptoms are. General guidelines for caregivers include: ?Help the person with dementia remember people, appointments, and daily activities. ?Help the person with dementia manage his or her medicines. ?Help family and friends learn about  ways to communicate with the person with dementia. ?Create a safe living space to reduce the risk of injury or falls. ?Find a support group to help caregivers and family cope with the effects of dementia. ? ?Where to find more information ?[employer] of Neurological Disorders and Stroke: MasterBoxes.it ?Contact a health care provider if: ?New behavioral problems develop. ?Problems with swallowing develop. ?Confusion gets worse. ?Sleepiness gets worse. ?Get help right away if: ?Loss of consciousness occurs. ?There is a sudden loss of speech, balance, or thinking ability. ?New numbness or paralysis occurs. ?Sudden, severe headache occurs. ?Vision is lost or suddenly gets worse in one or both eyes. ?If you ever feel like the person with dementia may hurt himself or herself or others, or if he or she shares thoughts about taking his or her own life, get help right away. You can go to your nearest emergency department or: ?Call your local emergency services (911 in the [place] ?Call a suicide crisis helpline, such as the [facility] at 804-122-2958 or 988 in the [place]. This is open 24 hours a day in the [place] ?Text the Crisis Text Line at 786-806-2885 (in the [place].). ?Summary ?Vascular dementia is a type of dementia. It results from brain damage that is caused by the brain not getting enough blood. ?Vascular dementia is caused by conditions that reduce blood flow to the brain. Common causes of this condition include stroke and damage to small blood vessels in the brain. ?Treatment focuses on treating the underlying cause of vascular dementia and managing any risk factors. ?People with vascular dementia may need regular help at home or daily care from a family member or home health care worker. ?Contact a health care provider if you or your caregiver notices any new symptoms. ?This information is not intended to replace advice given to you by your health care provider. Make sure you discuss any  questions you have with your health care provider. ?Document Revised: 11/16/2020  Document Reviewed: 09/07/2019 ?Elsevier Patient Education ? [place]. ? ?

## 2021-08-28 NOTE — Progress Notes (Signed)
? ?Subjective: ?CC: Mood disorder ?PCP: [name], DO ?CBJ:SEGBT Rebecca Moreno is a 56 y.o. female presenting to clinic today for: ? ?1.  Mood disorder ?Patient is under the care of Dr. Modesta Messing for mood disorder.  She is currently prescribed Klonopin, Vraylar, gabapentin, Effexor and Seroquel.  Apparently there was a recent conversation between she and her psychiatrist which her psychiatrist asked if I would take over her medications.  After further discussion it sounds like Dr. Modesta Messing simply wants the patient to come in for a face-to-face visit for her controlled substances.  We discussed that this is normal and expected in accordance of the law.  Patient will make sure that she has an in office visit with her psychiatrist soon. ? ?2.  Memory concern ?Patient with history of CVA.  Her daughter voices concern over her memory, which seems to be a little worse as of late. ? ? ?ROS: Per HPI ? ?Allergies  ?Allergen Reactions  ? Lithium Other (See Comments)  ?  Psoriasis   ? ?Past Medical History:  ?Diagnosis Date  ? Acute pyelonephritis   ? Asthma   ? Bipolar disorder ([place])   ? Colon polyp   ? Complication of anesthesia   ? "sometimes hard to put to sleep"  ? Depression   ? Bipolar  ? Gastric ulcer   ? around 2013.  stress related  ? GERD (gastroesophageal reflux disease)   ? History of kidney stones   ? Hyperlipidemia   ? Hypothyroid   ? Polyp, uterus corpus   ? Sepsis due to Escherichia coli (E. coli) ([place]) 09/04/2018  ? Sleep apnea   ? no cpap used  ? Stroke [facility])   ? ? ?Current Outpatient Medications:  ?  albuterol (VENTOLIN HFA) 108 (90 Base) MCG/ACT inhaler, Inhale 1 puff into the lungs every 6 (six) hours as needed for wheezing or shortness of breath., Disp: 6.7 g, Rfl: 1 ?  atorvastatin (LIPITOR) 80 MG tablet, TAKE ONE TABLET AT BEDTIME, Disp: 90 tablet, Rfl: 0 ?  CALCIUM CITRATE-VITAMIN D3 PO, Take 1 tablet by mouth daily., Disp: , Rfl:  ?  Cariprazine HCl (VRAYLAR) 6 MG CAPS, Take 1 capsule (6 mg total)  by mouth daily., Disp: 90 capsule, Rfl: 0 ?  Cholecalciferol (VITAMIN D3) 5000 units TABS, Take 5,000 Units by mouth daily. , Disp: , Rfl:  ?  clonazePAM (KLONOPIN) 1 MG tablet, Take 1 tablet (1 mg total) by mouth 2 (two) times daily as needed for anxiety., Disp: 60 tablet, Rfl: 1 ?  DEXILANT 30 MG capsule DR, TAKE (1) CAPSULE DAILY, Disp: 90 capsule, Rfl: 0 ?  DHS SAL 3 % SHAM, Apply topically every morning., Disp: , Rfl:  ?  famotidine (PEPCID) 20 MG tablet, TAKE ONE TABLET BY MOUTH EVERY DAY, Disp: 90 tablet, Rfl: 0 ?  fluticasone (FLONASE) 50 MCG/ACT nasal spray, SPRAY 2 SPRAYS INTO EACH NOSTRIL EVERY DAY, Disp: 48 mL, Rfl: 0 ?  fluticasone (FLOVENT HFA) 220 MCG/ACT inhaler, Inhale 2 puffs into the lungs in the morning and at bedtime., Disp: 1 each, Rfl: 12 ?  gabapentin (NEURONTIN) 300 MG capsule, Take 600 mg by mouth at bedtime., Disp: , Rfl:  ?  lamoTRIgine (LAMICTAL) 100 MG tablet, Take 3 tablets (300 mg total) by mouth at bedtime., Disp: 270 tablet, Rfl: 0 ?  levothyroxine (SYNTHROID) 50 MCG tablet, TAKE 1 TABLET DAILY, Disp: 90 tablet, Rfl: 1 ?  loratadine (CLARITIN) 10 MG tablet, TAKE 1 TABLET DAILY, Disp: 30 tablet,  Rfl: 11 ?  magnesium gluconate (MAGONATE) 500 MG tablet, Take 500 mg by mouth daily., Disp: , Rfl:  ?  MODERNA COVID-19 BIVAL BOOSTER 50 MCG/0.5ML injection, , Disp: , Rfl:  ?  Multiple Vitamins-Minerals (MULTIVITAMIN WOMEN PO), Take 1 tablet by mouth daily., Disp: , Rfl:  ?  QUEtiapine (SEROQUEL) 300 MG tablet, Take 1 tablet (300 mg total) by mouth at bedtime., Disp: 90 tablet, Rfl: 0 ?  venlafaxine XR (EFFEXOR-XR) 150 MG 24 hr capsule, Take 2 capsules (300 mg total) by mouth daily., Disp: 180 capsule, Rfl: 0 ?Social History  ? ?Socioeconomic History  ? Marital status: Married  ?  Spouse name: Not on file  ? Number of children: 1  ? Years of education: Not on file  ? Highest education level: High school graduate  ?Occupational History  ? Occupation: disability  ?Tobacco Use  ? Smoking  status: Every Day  ?  Packs/day: 0.50  ?  Years: 30.00  ?  Pack years: 15.00  ?  Types: Cigarettes  ? Smokeless tobacco: Never  ?Vaping Use  ? Vaping Use: Never used  ?Substance and Sexual Activity  ? Alcohol use: Never  ? Drug use: Not Currently  ? Sexual activity: Not Currently  ?  Birth control/protection: Surgical  ?  Comment: tubal  ?Other Topics Concern  ? Not on file  ?Social History Narrative  ? Not on file  ? ?Social Determinants of Health  ? ?Financial Resource Strain: High Risk  ? Difficulty of Paying Living Expenses: Hard  ?Food Insecurity: Food Insecurity Present  ? Worried About Charity fundraiser in the Last Year: Often true  ? Ran Out of Food in the Last Year: Often true  ?Transportation Needs: No Transportation Needs  ? Lack of Transportation (Medical): No  ? Lack of Transportation (Non-Medical): No  ?Physical Activity: Insufficiently Active  ? Days of Exercise per Week: 3 days  ? Minutes of Exercise per Session: 20 min  ?Stress: No Stress Concern Present  ? Feeling of Stress : Not at all  ?Social Connections: Moderately Isolated  ? Frequency of Communication with Friends and Family: More than three times a week  ? Frequency of Social Gatherings with Friends and Family: More than three times a week  ? Attends Religious Services: Never  ? Active Member of Clubs or Organizations: No  ? Attends Archivist Meetings: Never  ? Marital Status: Married  ?Intimate Partner Violence: Not At Risk  ? Fear of Current or Ex-Partner: No  ? Emotionally Abused: No  ? Physically Abused: No  ? Sexually Abused: No  ? ?Family History  ?Problem Relation Age of Onset  ? Anxiety disorder Mother   ? Hypertension Mother   ? Heart failure Mother   ? Stroke Mother   ? Hyperlipidemia Father   ? Diabetes Father   ? Stroke Father   ? Hypertension Father   ? Colon polyps Father   ?     older than 19  ? Diabetes Sister   ? Hypertension Sister   ? Ovarian cancer Sister   ? Asthma Daughter   ? Ovarian cancer Maternal  Grandmother   ? Colon cancer Maternal Grandmother   ? Renal Disease Paternal Grandmother   ? Heart attack Paternal Grandfather   ? Bipolar disorder Other   ? ? ?Objective: ?Office vital signs reviewed. ?BP 122/81   Pulse 76   Temp 98.2 ?F (36.8 ?C)   Ht '5\' 6"'$  (1.676 m)   Wt  199 lb 3.2 oz (90.4 kg)   LMP 12/13/2017 (Approximate)   SpO2 90%   BMI 32.15 kg/m?  ? ?Physical Examination:  ?General: Awake, alert, well nourished, No acute distress ?HEENT: sclera white, MMM ?Cardio: regular rate and rhythm, S1S2 heard, no murmurs appreciated ?Pulm: clear to auscultation bilaterally, no wheezes, rhonchi or rales; normal work of breathing on room air ?Neuro: see MMSE ?Psych: affect flat, mood stable ? ? ?  08/28/2021  ?  2:03 PM 07/22/2018  ?  4:16 PM  ?MMSE - Mini Mental State Exam  ?Not completed: Refused   ?Orientation to time 4 5  ?Orientation to Place 5 5  ?Registration 3 3  ?Attention/ Calculation 5 5  ?Recall 3 3  ?Language- name 2 objects 2 2  ?Language- repeat 1 1  ?Language- follow 3 step command 3 3  ?Language- read & follow direction 1 1  ?Write a sentence 1 1  ?Copy design 1 1  ?Total score 29 30  ? ? ? ?  08/28/2021  ?  2:03 PM 07/19/2021  ?  1:43 PM 07/10/2021  ?  9:49 AM  ?Depression screen PHQ 2/9  ?Decreased Interest 0  0  ?Down, Depressed, Hopeless 0  0  ?PHQ - 2 Score 0  0  ?  ? Information is confidential and restricted. Go to Review Flowsheets to unlock data.  ? ? ?  08/28/2021  ?  2:03 PM 02/20/2021  ?  3:08 PM 06/29/2020  ?  3:27 PM 08/19/2019  ? 11:50 AM  ?GAD 7 : Generalized Anxiety Score  ?Nervous, Anxious, on Edge 0 '1 3 3  '$ ?Control/stop worrying 0 '1 2 3  '$ ?Worry too much - different things 0 '1 1 1  '$ ?Trouble relaxing 0 '1 1 3  '$ ?Restless 0 1 2 0  ?Easily annoyed or irritable 0 '1 1 1  '$ ?Afraid - awful might happen 0 0 0 0  ?Total GAD 7 Score 0 '6 10 11  '$ ?Anxiety Difficulty Not difficult at all Somewhat difficult Somewhat difficult Not difficult at all  ? ? ? ? ?Assessment/ Plan: ?56 y.o. female  ? ?Memory  loss - Plan: RPR, TSH, T4, Free, Vitamin B12, HIV antibody (with reflex) ? ?History of CVA in adulthood ? ?Mood disorder in conditions classified elsewhere ? ?There is no evidence of dementia on exam.  MMSE was normal rang

## 2021-09-01 ENCOUNTER — Telehealth: Payer: Self-pay | Admitting: *Deleted

## 2021-09-01 DIAGNOSIS — N1831 Chronic kidney disease, stage 3a: Secondary | ICD-10-CM

## 2021-09-01 NOTE — Telephone Encounter (Signed)
Can patient come in for a BMP? ?

## 2021-09-01 NOTE — Telephone Encounter (Signed)
lmtcb

## 2021-09-01 NOTE — Telephone Encounter (Signed)
TC from [place] at [facility] ?Pt had gotten Furosemide 20 mg sent to her house inadvertently instead of her Famotidine, she has still been taking the Famotidine but for the last 10-14 days has been taking Furosemide. Suanne Marker has been trying to get in touch with the patient for the last couple of days. Got a hold of her about a half an hour ago. She has been feeling fine, checks her BP which has been good, she has just been urinating a lot. ?Any questions just get in touch with Suanne Marker at the pharmacy ?

## 2021-09-01 NOTE — Telephone Encounter (Signed)
Pt called back and states she will come in Monday or Tuesday at the latest for labs and future lab order placed. ?

## 2021-09-01 NOTE — Addendum Note (Signed)
Addended by: Milas Hock on: 09/01/2021 04:24 PM ? ? Modules accepted: Orders ? ?

## 2021-09-04 ENCOUNTER — Other Ambulatory Visit: Payer: Medicare Other

## 2021-09-04 DIAGNOSIS — N1831 Chronic kidney disease, stage 3a: Secondary | ICD-10-CM

## 2021-09-04 DIAGNOSIS — R413 Other amnesia: Secondary | ICD-10-CM | POA: Diagnosis not present

## 2021-09-05 LAB — BMP8+EGFR
BUN/Creatinine Ratio: 15 (ref 9–23)
BUN: 16 mg/dL (ref 6–24)
CO2: 29 mmol/L (ref 20–29)
Calcium: 9.8 mg/dL (ref 8.7–10.2)
Chloride: 102 mmol/L (ref 96–106)
Creatinine, Ser: 1.08 mg/dL — ABNORMAL HIGH (ref 0.57–1.00)
Glucose: 168 mg/dL — ABNORMAL HIGH (ref 70–99)
Potassium: 4.8 mmol/L (ref 3.5–5.2)
Sodium: 146 mmol/L — ABNORMAL HIGH (ref 134–144)
eGFR: 60 mL/min/{1.73_m2} (ref >59)

## 2021-09-05 LAB — HIV ANTIBODY (ROUTINE TESTING W REFLEX): HIV Screen 4th Generation wRfx: NONREACTIVE

## 2021-09-05 LAB — VITAMIN B12: Vitamin B-12: 1409 pg/mL — ABNORMAL HIGH (ref 232–1245)

## 2021-09-05 LAB — RPR: RPR Ser Ql: REACTIVE — AB

## 2021-09-05 LAB — RPR, QUANT+TP ABS (REFLEX)
Rapid Plasma Reagin, Quant: 1:1 {titer} — ABNORMAL HIGH
T Pallidum Abs: NONREACTIVE

## 2021-09-05 LAB — T4, FREE: Free T4: 0.93 ng/dL (ref 0.82–1.77)

## 2021-09-05 LAB — TSH: TSH: 1.1 u[IU]/mL (ref 0.450–4.500)

## 2021-09-06 ENCOUNTER — Encounter: Payer: Self-pay | Admitting: Emergency Medicine

## 2021-09-09 DIAGNOSIS — J449 Chronic obstructive pulmonary disease, unspecified: Secondary | ICD-10-CM | POA: Diagnosis not present

## 2021-09-09 DIAGNOSIS — R0902 Hypoxemia: Secondary | ICD-10-CM | POA: Diagnosis not present

## 2021-09-09 DIAGNOSIS — G4733 Obstructive sleep apnea (adult) (pediatric): Secondary | ICD-10-CM | POA: Diagnosis not present

## 2021-09-09 NOTE — Progress Notes (Signed)
Virtual Visit via Telephone Note ? ?I connected with Rebecca Moreno on 09/11/21 at  1:00 PM EDT by telephone and verified that I am speaking with the correct person using two identifiers. ? ?Location: ?Patient: home ?Provider: office ?Persons participated in the visit- patient, provider  ?  ?I discussed the limitations, risks, security and privacy concerns of performing an evaluation and management service by telephone and the availability of in person appointments. I also discussed with the patient that there may be a patient responsible charge related to this service. The patient expressed understanding and agreed to proceed. ? ?  ?I discussed the assessment and treatment plan with the patient. The patient was provided an opportunity to ask questions and all were answered. The patient agreed with the plan and demonstrated an understanding of the instructions. ?  ?The patient was advised to call back or seek an in-person evaluation if the symptoms worsen or if the condition fails to improve as anticipated. ? ?I provided 16 minutes of non-face-to-face time during this encounter. ? ? ?Norman Clay, MD ? ? ? ?[facility] MD/PA/NP OP Progress Note ? ?09/11/2021 1:28 PM ?Rebecca Moreno  ?MRN:  [id number] ? ?Chief Complaint:  ?Chief Complaint  ?Patient presents with  ? Follow-up  ? Depression  ? ?HPI:  ?- pcp declined to take over her medication  ? ?This is a follow-up appointment for depression.  ?She states that she continues to take care of her mother-in-law with dementia, who has occasional "sundowning" episode.  She tends to feel aggravated by this.  She tries to be by herself when it happens.  Although she feels depressed due to this, it has been relatively manageable. She "Get along (with her husband). that's about it."  She wants to stay on the same medication regimen.  She has been trying to take lower dose of clonazepam as she feels anxious.  She also struggles with hip pain, and has been unable to do any exercise.  She  feels frustrated that she is unable to lose weight.  However, she is not interested in a medication change despite quetiapine and Vraylar can cause weight gain.  She has occasional insomnia despite using CPAP machine.  She denies SI.  She feels anxious and tense, although she denies panic attacks.  She denies decreased need for sleep or euphonia.  She denies hallucinations or paranoia.  She states that her PCP will not prescribe her clonazepam, and she agrees to come for in person visit in [place]. ? ?She reportedly had a stroke in May 2021 without residual symptoms.  She has not seen a neurologist, and is not on statin.  She agrees to have follow-up with neurologist.  ? ? ?Wt Readings from Last 3 Encounters:  ?08/28/21 199 lb 3.2 oz (90.4 kg)  ?07/24/21 201 lb 14.4 oz (91.6 kg)  ?07/10/21 182 lb (82.6 kg)  ?  ? ?Daily routine: visits her daughter, who lives 15 miles away, goes to gym 3 times per week with her daughter ?Employment: on disability after TMJ surgery in 2007. She used to work as an Mining engineer at [employer] ?Household: husband, mother in law with dementia ?Marital status: married ?Number of children: 1 daughter ?She grew up in [place]. Her parents had marital discordance and the patient screamed when she was a child as she did not want to hear them. She reports good relationship with both of her parents otherwise. She has one sister, who she has good relationship with ? ?Visit Diagnosis:  ?  ICD-10-CM   ?1. MDD (major depressive disorder), recurrent episode, moderate (HCC)  F33.1   ?  ? ? ?Past Psychiatric History: Please see initial evaluation for full details. I have reviewed the history. No updates at this time.  ?  ? ?Past Medical History:  ?Past Medical History:  ?Diagnosis Date  ? Acute pyelonephritis   ? Asthma   ? Bipolar disorder ([place])   ? Colon polyp   ? Complication of anesthesia   ? "sometimes hard to put to sleep"  ? Depression   ? Bipolar  ? Gastric ulcer   ? around 2013.  stress related  ? GERD  (gastroesophageal reflux disease)   ? History of kidney stones   ? Hyperlipidemia   ? Hypothyroid   ? Polyp, uterus corpus   ? Sepsis due to Escherichia coli (E. coli) ([place]) 09/04/2018  ? Sleep apnea   ? no cpap used  ? Stroke [facility])   ?  ?Past Surgical History:  ?Procedure Laterality Date  ? ABLATION ON ENDOMETRIOSIS    ? COLONOSCOPY    ? Per patient, done around 2013 in [place], had polyp and overdue for follow up.  ? COLONOSCOPY WITH PROPOFOL N/A 05/12/2018  ? Procedure: COLONOSCOPY WITH PROPOFOL;  Surgeon: Daneil Dolin, MD;  Location: [facility];  Service: Endoscopy;  Laterality: N/A;  12:00pm  ? CYSTOSCOPY W/ URETERAL STENT PLACEMENT Left 09/01/2018  ? Procedure: CYSTOSCOPY WITH RETROGRADE PYELOGRAM/URETERAL STENT PLACEMENT;  Surgeon: Ceasar Mons, MD;  Location: [facility] OR[facility];  Service: Urology;  Laterality: Left;  ? CYSTOSCOPY/URETEROSCOPY/HOLMIUM LASER/STENT PLACEMENT Left 09/17/2018  ? Procedure: CYSTOSCOPY/URETEROSCOPY/HOLMIUM LASER/STENT PLACEMENT;  Surgeon: Ceasar Mons, MD;  Location: [facility];  Service: Urology;  Laterality: Left;  ONLY NEEDS 45 MIN  ? OVARIAN CYST SURGERY Left   ? POLYPECTOMY  05/12/2018  ? Procedure: POLYPECTOMY;  Surgeon: Daneil Dolin, MD;  Location: [facility];  Service: Endoscopy;;  polyp at splenic flexure  ? TEMPOROMANDIBULAR JOINT SURGERY    ? 9 surgeries  ? TUBAL LIGATION    ? vaginal childbirth    ? 1  ? ? ?Family Psychiatric History: Please see initial evaluation for full details. I have reviewed the history. No updates at this time.  ?  ? ?Family History:  ?Family History  ?Problem Relation Age of Onset  ? Anxiety disorder Mother   ? Hypertension Mother   ? Heart failure Mother   ? Stroke Mother   ? Hyperlipidemia Father   ? Diabetes Father   ? Stroke Father   ? Hypertension Father   ? Colon polyps Father   ?     older than 31  ? Diabetes Sister   ? Hypertension Sister   ? Ovarian cancer Sister   ? Asthma Daughter   ? Ovarian cancer Maternal  Grandmother   ? Colon cancer Maternal Grandmother   ? Renal Disease Paternal Grandmother   ? Heart attack Paternal Grandfather   ? Bipolar disorder Other   ? ? ?Social History:  ?Social History  ? ?Socioeconomic History  ? Marital status: Married  ?  Spouse name: Not on file  ? Number of children: 1  ? Years of education: Not on file  ? Highest education level: High school graduate  ?Occupational History  ? Occupation: disability  ?Tobacco Use  ? Smoking status: Every Day  ?  Packs/day: 0.50  ?  Years: 30.00  ?  Pack years: 15.00  ?  Types: Cigarettes  ? Smokeless tobacco:  Never  ?Vaping Use  ? Vaping Use: Never used  ?Substance and Sexual Activity  ? Alcohol use: Never  ? Drug use: Not Currently  ? Sexual activity: Not Currently  ?  Birth control/protection: Surgical  ?  Comment: tubal  ?Other Topics Concern  ? Not on file  ?Social History Narrative  ? Not on file  ? ?Social Determinants of Health  ? ?Financial Resource Strain: High Risk  ? Difficulty of Paying Living Expenses: Hard  ?Food Insecurity: Food Insecurity Present  ? Worried About Charity fundraiser in the Last Year: Often true  ? Ran Out of Food in the Last Year: Often true  ?Transportation Needs: No Transportation Needs  ? Lack of Transportation (Medical): No  ? Lack of Transportation (Non-Medical): No  ?Physical Activity: Insufficiently Active  ? Days of Exercise per Week: 3 days  ? Minutes of Exercise per Session: 20 min  ?Stress: No Stress Concern Present  ? Feeling of Stress : Not at all  ?Social Connections: Moderately Isolated  ? Frequency of Communication with Friends and Family: More than three times a week  ? Frequency of Social Gatherings with Friends and Family: More than three times a week  ? Attends Religious Services: Never  ? Active Member of Clubs or Organizations: No  ? Attends Archivist Meetings: Never  ? Marital Status: Married  ? ? ?Allergies:  ?Allergies  ?Allergen Reactions  ? Lithium Other (See Comments)  ?  Psoriasis    ? ? ?Metabolic Disorder Labs: ?Lab Results  ?Component Value Date  ? HGBA1C 5.2 02/20/2021  ? ?No results found for: PROLACTIN ?Lab Results  ?Component Value Date  ? CHOL 212 (H) 12/18/2018  ? TRIG 171 (H) 08

## 2021-09-11 ENCOUNTER — Telehealth (INDEPENDENT_AMBULATORY_CARE_PROVIDER_SITE_OTHER): Payer: Medicare Other | Admitting: Psychiatry

## 2021-09-11 ENCOUNTER — Encounter: Payer: Self-pay | Admitting: Psychiatry

## 2021-09-11 DIAGNOSIS — F331 Major depressive disorder, recurrent, moderate: Secondary | ICD-10-CM | POA: Diagnosis not present

## 2021-09-11 NOTE — Patient Instructions (Signed)
Continue venlafaxine 300 mg daily ?Continue Lamotrigine 300 mg daily ?Continue vraylar 6 mg daily ?Continue quetiapine 300 mg at night ?Continue clonazepam 1 mg twice a day as needed for anxiety  ?Next appointment: 6/15 at 2 PM, in person ? ?The next visit will be in person visit. Please arrive 15 mins before the scheduled time.  ? ?[facility]  ?Address: [address], [place], [place] [address]   ?

## 2021-09-22 ENCOUNTER — Inpatient Hospital Stay (HOSPITAL_COMMUNITY): Payer: Medicare Other | Attending: Hematology

## 2021-09-22 ENCOUNTER — Encounter (HOSPITAL_COMMUNITY): Payer: Self-pay

## 2021-09-22 DIAGNOSIS — D751 Secondary polycythemia: Secondary | ICD-10-CM | POA: Diagnosis not present

## 2021-09-22 NOTE — Progress Notes (Signed)
Rebecca Moreno presents for therapeutic phlebotomy per MD orders. Last HGB 16.4 / HCT 49.7 on 07-17-2021 . Vital signs stable prior to procedure. Procedure started at 10:49 am and ended at 10:53 am. 500 mls of blood removed. Patient denies any dizziness , lightheadedness, or feeling faint.  Gauze and coban applied to site. Vital signs stable at completion of procedure. Patient has no complaints at this time. Alert and oriented x 3. Discharged in stable condition 30 minutes post phlebotomy.

## 2021-09-22 NOTE — Patient Instructions (Signed)
[facility]  Discharge Instructions: Thank you for choosing [facility] to provide your oncology and hematology care.  If you have a lab appointment with the [facility], please come in thru the Main Entrance and check in at the main information desk.  Wear comfortable clothing and clothing appropriate for easy access to any Portacath or PICC line.   We strive to give you quality time with your provider. You may need to reschedule your appointment if you arrive late (15 or more minutes).  Arriving late affects you and other patients whose appointments are after yours.  Also, if you miss three or more appointments without notifying the office, you may be dismissed from the clinic at the provider's discretion.      For prescription refill requests, have your pharmacy contact our office and allow 72 hours for refills to be completed.    Today you received the following chemotherapy and/or immunotherapy agents Phlebotomy.      To help prevent nausea and vomiting after your treatment, we encourage you to take your nausea medication as directed.  BELOW ARE SYMPTOMS THAT SHOULD BE REPORTED IMMEDIATELY: *FEVER GREATER THAN 100.4 F (38 C) OR HIGHER *CHILLS OR SWEATING *NAUSEA AND VOMITING THAT IS NOT CONTROLLED WITH YOUR NAUSEA MEDICATION *UNUSUAL SHORTNESS OF BREATH *UNUSUAL BRUISING OR BLEEDING *URINARY PROBLEMS (pain or burning when urinating, or frequent urination) *BOWEL PROBLEMS (unusual diarrhea, constipation, pain near the anus) TENDERNESS IN MOUTH AND THROAT WITH OR WITHOUT PRESENCE OF ULCERS (sore throat, sores in mouth, or a toothache) UNUSUAL RASH, SWELLING OR PAIN  UNUSUAL VAGINAL DISCHARGE OR ITCHING   Items with * indicate a potential emergency and should be followed up as soon as possible or go to the Emergency Department if any problems should occur.  Please show the CHEMOTHERAPY ALERT CARD or IMMUNOTHERAPY ALERT CARD at check-in to the Emergency  Department and triage nurse.  Should you have questions after your visit or need to cancel or reschedule your appointment, please contact [facility] [phone]  and follow the prompts.  Office hours are 8:00 a.m. to 4:30 p.m. Monday - Friday. Please note that voicemails left after 4:00 p.m. may not be returned until the following business day.  We are closed weekends and major holidays. You have access to a nurse at all times for urgent questions. Please call the main number to the clinic [phone] and follow the prompts.  For any non-urgent questions, you may also contact your provider using MyChart. We now offer e-Visits for anyone 93 and older to request care online for non-urgent symptoms. For details visit [email or web address].   Also download the MyChart app! Go to the app store, search "MyChart", open the app, select [facility], and log in with your MyChart username and password.  Due to Covid, a mask is required upon entering the hospital/clinic. If you do not have a mask, one will be given to you upon arrival. For doctor visits, patients may have 1 support person aged 50 or older with them. For treatment visits, patients cannot have anyone with them due to current Covid guidelines and our immunocompromised population.

## 2021-09-27 ENCOUNTER — Other Ambulatory Visit: Payer: Self-pay | Admitting: Psychiatry

## 2021-10-10 DIAGNOSIS — J449 Chronic obstructive pulmonary disease, unspecified: Secondary | ICD-10-CM | POA: Diagnosis not present

## 2021-10-10 DIAGNOSIS — G4733 Obstructive sleep apnea (adult) (pediatric): Secondary | ICD-10-CM | POA: Diagnosis not present

## 2021-10-10 DIAGNOSIS — R0902 Hypoxemia: Secondary | ICD-10-CM | POA: Diagnosis not present

## 2021-10-16 NOTE — Progress Notes (Signed)
BH MD/PA/NP OP Progress Note  10/19/2021 2:34 PM Rebecca Moreno  MRN:  [id number]  Chief Complaint:  Chief Complaint  Patient presents with   Follow-up   Depression   HPI:  This is a follow-up appointment for depression and anxiety.  She states that she has been doing good.  She goes to ball games for her niece every week.  She also states that her mother-in-law has been doing better.  Her mother-in-law will tell her to go to places when she is good.  She has fair relationship with her husband.  They still live together, and they are "friends."  She denies feeling depressed or crying lately.  She is concerned about her weight gain, and is willing to try lowering the dose of quetiapine at this time.  She denies SI.  She feels less anxious, although she continues to take clonazepam every day.  She denies decreased need for sleep or euphonia.  She denies hallucinations.  She denies alcohol use or drug use.   Past psych admission-she was admitted for times when she was in [place].  She states that she has been admitted mainly due to depression.  She talks about an episode of her feeling very nervous and crying that time. She also states that she was advised by her husband to go to the hospital to "calm down" due to her being "mad."  She reports history of seeing [facility] [place] [place].  She denies AH.  Although she reports decreased need for sleep up to 7 days, she is unable to describe details.   Psychiatry admission: SI in 2007 in the context of separation, and four times in [place] in 2016  Daily routine: visits her daughter, who lives 15 miles away, goes to gym 3 times per week with her daughter Employment: on disability after TMJ surgery in 2007. 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 Her parents had marital discordance and the patient screamed when she was a child as she did  not want to hear them. She reports good relationship with both of her parents otherwise. She has one sister, who she has good relationship with    Wt Readings from Last 3 Encounters:  10/19/21 199 lb (90.3 kg)  08/28/21 199 lb 3.2 oz (90.4 kg)  07/24/21 201 lb 14.4 oz (91.6 kg)   02/13/18 221 lb (100.2 kg)    Visit Diagnosis:    ICD-10-CM   1. MDD (major depressive disorder), recurrent, in partial remission ([place])  F33.41 Lipid panel    2. Anxiety state  F41.1       Past Psychiatric History: Please see initial evaluation for full details. I have reviewed the history. No updates at this time.     Past Medical History:  Past Medical History:  Diagnosis Date   Acute pyelonephritis    Asthma    Bipolar disorder ([place])    Colon polyp    Complication of anesthesia    "sometimes hard to put to sleep"   Depression    Bipolar   Gastric ulcer    around 2013.  stress related   GERD (gastroesophageal reflux disease)    History of kidney stones    Hyperlipidemia    Hypothyroid    Polyp, uterus corpus    Sepsis due to Escherichia coli (E. coli) ([place]) 09/04/2018   Sleep apnea  no cpap used   Stroke [facility])     Past Surgical History:  Procedure Laterality Date   ABLATION ON ENDOMETRIOSIS     COLONOSCOPY     Per patient, done around 2013 in [place], had polyp and overdue for follow up.   COLONOSCOPY WITH PROPOFOL N/A 05/12/2018   Procedure: COLONOSCOPY WITH PROPOFOL;  Surgeon: Daneil Dolin, MD;  Location[facility];  Service: Endoscopy;  Laterality: N/A;  12:00pm   CYSTOSCOPY W/ URETERAL STENT PLACEMENT Left 09/01/2018   Procedure: CYSTOSCOPY WITH RETROGRADE PYELOGRAM/URETERAL STENT PLACEMENT;  Surgeon: Ceasar Mons, MD;  Location: [facility] ORS;  Service: Urology;  Laterality: Left;   CYSTOSCOPY/URETEROSCOPY/HOLMIUM LASER/STENT PLACEMENT Left 09/17/2018   Procedure: CYSTOSCOPY/URETEROSCOPY/HOLMIUM LASER/STENT PLACEMENT;  Surgeon: Ceasar Mons, MD;   Location: [facility];  Service: Urology;  Laterality: Left;  ONLY NEEDS 45 MIN   OVARIAN CYST SURGERY Left    POLYPECTOMY  05/12/2018   Procedure: POLYPECTOMY;  Surgeon: Daneil Dolin, MD;  Location: [facility];  Service: Endoscopy;;  polyp at splenic flexure   TEMPOROMANDIBULAR JOINT SURGERY     9 surgeries   TUBAL LIGATION     vaginal childbirth     1    Family Psychiatric History: Please see initial evaluation for full details. I have reviewed the history. No updates at this time.     Family History:  Family History  Problem Relation Age of Onset   Anxiety disorder Mother    Hypertension Mother    Heart failure Mother    Stroke Mother    Hyperlipidemia Father    Diabetes Father    Stroke Father    Hypertension Father    Colon polyps Father        older than 85   Diabetes Sister    Hypertension Sister    Ovarian cancer Sister    Asthma Daughter    Ovarian cancer Maternal Grandmother    Colon cancer Maternal Grandmother    Renal Disease Paternal Grandmother    Heart attack Paternal Grandfather    Bipolar disorder Other     Social History:  Social History   Socioeconomic History   Marital status: Married    Spouse name: Not on file   Number of children: 1   Years of education: Not on file   Highest education level: High school graduate  Occupational History   Occupation: disability  Tobacco Use   Smoking status: Every Day    Packs/day: 0.50    Years: 30.00    Total pack years: 15.00    Types: Cigarettes   Smokeless tobacco: Never  Vaping Use   Vaping Use: Never used  Substance and Sexual Activity   Alcohol use: Never   Drug use: Not Currently   Sexual activity: Yes    Partners: Male    Birth control/protection: Surgical    Comment: tubal  Other Topics Concern   Not on file  Social History Narrative   Not on file   Social Determinants of Health   Financial Resource Strain: High Risk (07/10/2021)   Overall Financial Resource Strain (CARDIA)     Difficulty of Paying Living Expenses: Hard  Food Insecurity: Food Insecurity Present (07/10/2021)   Hunger Vital Sign    Worried About Running Out of Food in the Last Year: Often true    Ran Out of Food in the Last Year: Often true  Transportation Needs: No Transportation Needs (07/10/2021)   PRAPARE - Transportation    Lack of  Transportation (Medical): No    Lack of Transportation (Non-Medical): No  Physical Activity: Insufficiently Active (07/10/2021)   Exercise Vital Sign    Days of Exercise per Week: 3 days    Minutes of Exercise per Session: 20 min  Stress: No Stress Concern Present (07/10/2021)   [place]    Feeling of Stress : Not at all  Social Connections: Moderately Isolated (07/10/2021)   Social Connection and Isolation Panel [NHANES]    Frequency of Communication with Friends and Family: More than three times a week    Frequency of Social Gatherings with Friends and Family: More than three times a week    Attends Religious Services: Never    Marine scientist or Organizations: No    Attends Archivist Meetings: Never    Marital Status: Married    Allergies:  Allergies  Allergen Reactions   Lithium Other (See Comments)    Psoriasis     Metabolic Disorder Labs: Lab Results  Component Value Date   HGBA1C 5.2 02/20/2021   No results found for: "PROLACTIN" Lab Results  Component Value Date   CHOL 212 (H) 12/18/2018   TRIG 171 (H) 12/18/2018   HDL 44 12/18/2018   CHOLHDL 4.8 (H) 12/18/2018   LDLCALC 134 (H) 12/18/2018   LDLCALC 168 (H) 01/15/2018   Lab Results  Component Value Date   TSH 1.100 09/04/2021   TSH 0.772 02/20/2021    Therapeutic Level Labs: No results found for: "LITHIUM" No results found for: "VALPROATE" No results found for: "CBMZ"  Current Medications: Current Outpatient Medications  Medication Sig Dispense Refill   albuterol (VENTOLIN HFA) 108 (90 Base) MCG/ACT  inhaler Inhale 1 puff into the lungs every 6 (six) hours as needed for wheezing or shortness of breath. 6.7 g 1   atorvastatin (LIPITOR) 80 MG tablet TAKE ONE TABLET AT BEDTIME 90 tablet 0   CALCIUM CITRATE-VITAMIN D3 PO Take 1 tablet by mouth daily.     Cholecalciferol (VITAMIN D3) 5000 units TABS Take 5,000 Units by mouth daily.      DEXILANT 30 MG capsule DR TAKE (1) CAPSULE DAILY 90 capsule 3   famotidine (PEPCID) 20 MG tablet TAKE ONE TABLET BY MOUTH EVERY DAY 90 tablet 0   fluticasone (FLONASE) 50 MCG/ACT nasal spray SPRAY 2 SPRAYS INTO EACH NOSTRIL EVERY DAY 48 mL 0   fluticasone (FLOVENT HFA) 220 MCG/ACT inhaler Inhale 2 puffs into the lungs in the morning and at bedtime. 1 each 12   levothyroxine (SYNTHROID) 50 MCG tablet TAKE 1 TABLET DAILY 90 tablet 1   loratadine (CLARITIN) 10 MG tablet TAKE 1 TABLET DAILY 30 tablet 11   magnesium gluconate (MAGONATE) 500 MG tablet Take 500 mg by mouth daily.     MODERNA COVID-19 BIVAL BOOSTER 50 MCG/0.5ML injection      Multiple Vitamins-Minerals (MULTIVITAMIN WOMEN PO) Take 1 tablet by mouth daily.     QUEtiapine (SEROQUEL) 200 MG tablet Take 1 tablet (200 mg total) by mouth at bedtime. Total of 250 mg at night. Take along with 50 mg tab 90 tablet 0   QUEtiapine (SEROQUEL) 300 MG tablet Take 1 tablet (300 mg total) by mouth at bedtime. 90 tablet 0   QUEtiapine (SEROQUEL) 50 MG tablet Take 1 tablet (50 mg total) by mouth at bedtime. Total of 250 mg at night. Take along with 200 mg tab 90 tablet 0   [START ON 11/07/2021] Cariprazine HCl (VRAYLAR) 6 MG  CAPS Take 1 capsule (6 mg total) by mouth daily. 90 capsule 0   [START ON 10/28/2021] clonazePAM (KLONOPIN) 1 MG tablet Take 1 tablet (1 mg total) by mouth 2 (two) times daily. 60 tablet 2   DHS SAL 3 % SHAM Apply topically every morning. (Patient not taking: Reported on 10/19/2021)     gabapentin (NEURONTIN) 300 MG capsule Take 600 mg by mouth at bedtime. (Patient not taking: Reported on 10/19/2021)      [START ON 11/14/2021] lamoTRIgine (LAMICTAL) 100 MG tablet Take 3 tablets (300 mg total) by mouth at bedtime. 270 tablet 1   [START ON 11/14/2021] venlafaxine XR (EFFEXOR-XR) 150 MG 24 hr capsule Take 2 capsules (300 mg total) by mouth daily. 180 capsule 1   No current facility-administered medications for this visit.     Musculoskeletal: Strength & Muscle Tone: within normal limits Gait & Station: normal Patient leans: N/A  Psychiatric Specialty Exam: Review of Systems  Psychiatric/Behavioral:  Positive for dysphoric mood. Negative for agitation, behavioral problems, confusion, decreased concentration, hallucinations, self-injury, sleep disturbance and suicidal ideas. The patient is nervous/anxious. The patient is not hyperactive.   All other systems reviewed and are negative.   Blood pressure 118/81, pulse 80, temperature 97.6 F (36.4 C), height 5' 5.5" (1.664 m), weight 199 lb (90.3 kg), last menstrual period 12/13/2017, SpO2 97 %.Body mass index is 32.61 kg/m.  General Appearance: Fairly Groomed  Eye Contact:  Good  Speech:  Clear and Coherent  Volume:  Normal  Mood:   good  Affect:  Appropriate, Congruent, and calm  Thought Process:  Coherent  Orientation:  Full (Time, Place, and Person)  Thought Content: Logical   Suicidal Thoughts:  No  Homicidal Thoughts:  No  Memory:  Immediate;   Good  Judgement:  Good  Insight:  Good  Psychomotor Activity:  Normal, no rigidity, no tremors  Concentration:  Concentration: Good and Attention Span: Good  Recall:  Good  Fund of Knowledge: Good  Language: Good  Akathisia:  No  Handed:  Right  AIMS (if indicated):0  Assets:  Communication Skills Desire for Improvement  ADL's:  Intact  Cognition: WNL  Sleep:  Good   Screenings: GAD-7    Flowsheet Row Office Visit from 08/28/2021 in [place] Visit from 02/20/2021 in [place] Office Visit from 06/29/2020 in [place] Visit from 08/19/2019 in [place] Visit from 04/16/2018 in [place]  Total GAD-7 Score 0 '6 10 11 3      '$ Mini-Mental    [place] Office Visit from 08/28/2021 in [place] from 07/22/2018 in [place]  Total Score (max 30 points ) 29 30      PHQ2-9    [place] Visit from 10/19/2021 in [place] Office Visit from 08/28/2021 in [place] Video Visit from 07/19/2021 in [place] from 07/10/2021 in [place] Visit from 02/20/2021 in [place]  PHQ-2 Total Score 1 0 1 0 4  PHQ-9 Total Score -- -- -- -- 9      [place] Office Visit from 10/19/2021 in [place] Video Visit from 11/15/2020 in [place] Video Visit from 08/18/2020 in [place] No Risk No Risk No Risk  Assessment and Plan:  DARLISHA KELM is a 56 y.o. year old female with a history of depression, hypothyroidism, obstructive sleep apnea (CPAP machine), stroke, Hyperlipidemia, Hypertension, stroke, who presents for follow up appointment for below.   1. MDD (major depressive disorder), recurrent, in partial remission ([place]) 2. Anxiety state # Bipolar I disorder by history There has been significant improvement in depressive symptoms and anxiety, which coincided with improvement in the condition of her mother-in-law with dementia. Other psychosocial stressors includes marital conflict.  She is now willing to try lowering the dose of quetiapine due to its potential risk of weight gain and to avoid polypharmacy.  Will continue venlafaxine, lamotrigine to target depression.  Will continue Vraylar to  target depression and mood dysregulation.  Noted that she has been on both quetiapine and Vraylar when she was first seen by this provider.  The hope is to eventually consolidate to 1 antipsychotics to avoid polypharmacy.  Will continue clonazepam as needed for anxiety. Noted that although the patient was reportedly diagnosed with bipolar I disorder in the past, both the patient and her husband denies any manic/hypomanic episode except irritability, paranoia in the context of depression.  Will continue to monitor.     Plan  Continue venlafaxine 300 mg daily Continue Lamotrigine 300 mg daily Continue vraylar 6 mg daily Decrease quetiapine 250 mg at night Continue clonazepam 1 mg twice a day as needed for anxiety  Next appointment: 9/13 at 1 PM for 30 mins, video Obtain lab - lipid    Past trials of medication: sertraline, fluoxetine, lexapro, Celexa, duloxetine, Wellbutrin  lithium (psoriasis), Depakote (did not work), olanzapine, Abilify (weight gain), latuda, Geodon    The patient demonstrates the following risk factors for suicide: Chronic risk factors for suicide include: psychiatric disorder of depression. Acute risk factors for suicide include: family or marital conflict and unemployment. Protective factors for this patient include: positive social support and hope for the future. Considering these factors, the overall suicide risk at this point appears to be low. Patient is appropriate for outpatient follow up.  Collaboration of Care: Collaboration of Care: Other N/A  Patient/Guardian was advised Release of Information must be obtained prior to any record release in order to collaborate their care with an outside provider. Patient/Guardian was advised if they have not already done so to contact the registration department to sign all necessary forms in order for [place] to release information regarding their care.   Consent: Patient/Guardian gives verbal consent for treatment and assignment of  benefits for services provided during this visit. Patient/Guardian expressed understanding and agreed to proceed.   Discussed with the patient that upcoming regulatory changes in Telehealth. The patient verbalized understanding that controlled substance will not be prescribed after March 17, 2022 unless the patient is able to do in person visit.     Norman Clay, MD 10/19/2021, 2:34 PM

## 2021-10-18 ENCOUNTER — Other Ambulatory Visit: Payer: Self-pay | Admitting: Family Medicine

## 2021-10-19 ENCOUNTER — Encounter: Payer: Self-pay | Admitting: Psychiatry

## 2021-10-19 ENCOUNTER — Ambulatory Visit (INDEPENDENT_AMBULATORY_CARE_PROVIDER_SITE_OTHER): Payer: Medicare Other | Admitting: Psychiatry

## 2021-10-19 VITALS — BP 118/81 | HR 80 | Temp 97.6°F | Ht 65.5 in | Wt 199.0 lb

## 2021-10-19 DIAGNOSIS — F411 Generalized anxiety disorder: Secondary | ICD-10-CM

## 2021-10-19 DIAGNOSIS — F3341 Major depressive disorder, recurrent, in partial remission: Secondary | ICD-10-CM

## 2021-10-19 MED ORDER — CLONAZEPAM 1 MG PO TABS
1.0000 mg | ORAL_TABLET | Freq: Two times a day (BID) | ORAL | 2 refills | Status: DC
Start: 2021-10-28 — End: 2022-01-17

## 2021-10-19 MED ORDER — VENLAFAXINE HCL ER 150 MG PO CP24
300.0000 mg | ORAL_CAPSULE | Freq: Every day | ORAL | 1 refills | Status: DC
Start: 2021-11-14 — End: 2022-05-30

## 2021-10-19 MED ORDER — VRAYLAR 6 MG PO CAPS
1.0000 | ORAL_CAPSULE | Freq: Every day | ORAL | 0 refills | Status: DC
Start: 2021-11-07 — End: 2022-01-17

## 2021-10-19 MED ORDER — QUETIAPINE FUMARATE 50 MG PO TABS: 50.0000 mg | ORAL_TABLET | Freq: Every day | ORAL | 0 refills | Status: DC

## 2021-10-19 MED ORDER — QUETIAPINE FUMARATE 200 MG PO TABS
200.0000 mg | ORAL_TABLET | Freq: Every day | ORAL | 0 refills | Status: DC
Start: 2021-10-19 — End: 2022-01-17

## 2021-10-19 MED ORDER — LAMOTRIGINE 100 MG PO TABS
300.0000 mg | ORAL_TABLET | Freq: Every day | ORAL | 1 refills | Status: DC
Start: 2021-11-14 — End: 2022-05-22

## 2021-10-19 NOTE — Patient Instructions (Signed)
Continue venlafaxine 300 mg daily Continue Lamotrigine 300 mg daily Continue vraylar 6 mg daily Decrease quetiapine 250 mg at night Continue clonazepam 1 mg twice a day as needed for anxiety  Next appointment: 9/13 at 1 PM  Obtain lab - lipid panels

## 2021-10-20 ENCOUNTER — Other Ambulatory Visit: Payer: Self-pay | Admitting: Family Medicine

## 2021-10-20 ENCOUNTER — Other Ambulatory Visit: Payer: Self-pay | Admitting: Psychiatry

## 2021-10-27 ENCOUNTER — Other Ambulatory Visit: Payer: Self-pay | Admitting: Family Medicine

## 2021-11-09 DIAGNOSIS — G4733 Obstructive sleep apnea (adult) (pediatric): Secondary | ICD-10-CM | POA: Diagnosis not present

## 2021-11-09 DIAGNOSIS — R0902 Hypoxemia: Secondary | ICD-10-CM | POA: Diagnosis not present

## 2021-11-09 DIAGNOSIS — J449 Chronic obstructive pulmonary disease, unspecified: Secondary | ICD-10-CM | POA: Diagnosis not present

## 2021-11-24 ENCOUNTER — Inpatient Hospital Stay (HOSPITAL_COMMUNITY): Payer: Medicare Other | Attending: Hematology

## 2021-11-24 DIAGNOSIS — Z79899 Other long term (current) drug therapy: Secondary | ICD-10-CM | POA: Diagnosis not present

## 2021-11-24 DIAGNOSIS — D751 Secondary polycythemia: Secondary | ICD-10-CM | POA: Diagnosis not present

## 2021-11-24 NOTE — Patient Instructions (Signed)
[facility]  Discharge Instructions: Thank you for choosing [facility] to provide your oncology and hematology care.  If you have a lab appointment with the [facility], please come in thru the Main Entrance and check in at the main information desk.  Wear comfortable clothing and clothing appropriate for easy access to any Portacath or PICC line.   We strive to give you quality time with your provider. You may need to reschedule your appointment if you arrive late (15 or more minutes).  Arriving late affects you and other patients whose appointments are after yours.  Also, if you miss three or more appointments without notifying the office, you may be dismissed from the clinic at the provider's discretion.      For prescription refill requests, have your pharmacy contact our office and allow 72 hours for refills to be completed.    Today you received the following chemotherapy and/or immunotherapy agents therapeutic phlebotomy     BELOW ARE SYMPTOMS THAT SHOULD BE REPORTED IMMEDIATELY: *FEVER GREATER THAN 100.4 F (38 C) OR HIGHER *CHILLS OR SWEATING *NAUSEA AND VOMITING THAT IS NOT CONTROLLED WITH YOUR NAUSEA MEDICATION *UNUSUAL SHORTNESS OF BREATH *UNUSUAL BRUISING OR BLEEDING *URINARY PROBLEMS (pain or burning when urinating, or frequent urination) *BOWEL PROBLEMS (unusual diarrhea, constipation, pain near the anus) TENDERNESS IN MOUTH AND THROAT WITH OR WITHOUT PRESENCE OF ULCERS (sore throat, sores in mouth, or a toothache) UNUSUAL RASH, SWELLING OR PAIN  UNUSUAL VAGINAL DISCHARGE OR ITCHING   Items with * indicate a potential emergency and should be followed up as soon as possible or go to the Emergency Department if any problems should occur.  Please show the CHEMOTHERAPY ALERT CARD or IMMUNOTHERAPY ALERT CARD at check-in to the Emergency Department and triage nurse.  Should you have questions after your visit or need to cancel or reschedule your  appointment, please contact [facility] [phone]  and follow the prompts.  Office hours are 8:00 a.m. to 4:30 p.m. Monday - Friday. Please note that voicemails left after 4:00 p.m. may not be returned until the following business day.  We are closed weekends and major holidays. You have access to a nurse at all times for urgent questions. Please call the main number to the clinic [phone] and follow the prompts.  For any non-urgent questions, you may also contact your provider using MyChart. We now offer e-Visits for anyone 34 and older to request care online for non-urgent symptoms. For details visit [email or web address].   Also download the MyChart app! Go to the app store, search "MyChart", open the app, select [facility], and log in with your MyChart username and password.  Masks are optional in the cancer centers. If you would like for your care team to wear a mask while they are taking care of you, please let them know. For doctor visits, patients may have with them one support person who is at least 57 years old. At this time, visitors are not allowed in the infusion area.

## 2021-11-24 NOTE — Progress Notes (Signed)
Rebecca Moreno presents today for theraputic phlebotomy per MD orders. Last hg 16.4b/hct 49.7 was on 07/17/21 .VSS prior to procedure. Pt reports eating before arrival. Procedure started at 1126 using patients right AC. 523m of blood removed. Procedure ended at 1132. Gauze and coban [facility] clean and dry. VSS upon completion of procedure. Pt denies dizziness, lightheadedness, or feeling faint. Observed for 30 minutes post procedure. Discharged in satisfactory condition with follow up instructions.

## 2021-12-10 DIAGNOSIS — R0902 Hypoxemia: Secondary | ICD-10-CM | POA: Diagnosis not present

## 2021-12-10 DIAGNOSIS — J449 Chronic obstructive pulmonary disease, unspecified: Secondary | ICD-10-CM | POA: Diagnosis not present

## 2021-12-10 DIAGNOSIS — G4733 Obstructive sleep apnea (adult) (pediatric): Secondary | ICD-10-CM | POA: Diagnosis not present

## 2021-12-14 ENCOUNTER — Telehealth: Payer: Self-pay

## 2021-12-14 NOTE — Telephone Encounter (Signed)
went online and submitted the prior auth for the vraylar. - pending

## 2021-12-14 NOTE — Telephone Encounter (Signed)
received email that a prior auth was needed for vraylar

## 2021-12-15 NOTE — Telephone Encounter (Signed)
vraylar is approved through 05-06-22. PA # [id number]

## 2022-01-10 DIAGNOSIS — J449 Chronic obstructive pulmonary disease, unspecified: Secondary | ICD-10-CM | POA: Diagnosis not present

## 2022-01-10 DIAGNOSIS — G4733 Obstructive sleep apnea (adult) (pediatric): Secondary | ICD-10-CM | POA: Diagnosis not present

## 2022-01-10 DIAGNOSIS — R0902 Hypoxemia: Secondary | ICD-10-CM | POA: Diagnosis not present

## 2022-01-15 NOTE — Progress Notes (Unsigned)
Virtual Visit via Video Note  I connected with Rebecca Moreno on 01/17/22 at  1:00 PM EDT by a video enabled telemedicine application and verified that I am speaking with the correct person using two identifiers.  Location: Patient: home Provider: office Persons participated in the visit- patient, provider    I discussed the limitations of evaluation and management by telemedicine and the availability of in person appointments. The patient expressed understanding and agreed to proceed.   I discussed the assessment and treatment plan with the patient. The patient was provided an opportunity to ask questions and all were answered. The patient agreed with the plan and demonstrated an understanding of the instructions.   The patient was advised to call back or seek an in-person evaluation if the symptoms worsen or if the condition fails to improve as anticipated.  I provided 15 minutes of non-face-to-face time during this encounter.   Norman Clay, MD     [facility] MD/PA/NP OP Progress Note  01/17/2022 1:38 PM Rebecca Moreno  MRN:  [id number]  Chief Complaint:  Chief Complaint  Patient presents with   Follow-up   Other   HPI:  This is a follow-up appointment for depression and anxiety.  She states that her mood has been okay.  She has good days and bad days.  Her mother-in-law has been doing good.  She spends time taking care of her most of the time.  She was able to enjoy ball game with her niece.  She does not feel depressed as much.  She does not notice any difference since lowering the dose of quetiapine.  She sleeps well.  She denies change in appetite.  She occasionally takes clonazepam for anxiety.  She denies panic attacks.  She denies decreased need for sleep, euphoria.  She denies hallucinations.  She denies SI. She denies alcohol use or drug use. She is willing to try lower dose of quetiapine.   Daily routine: visits her daughter, who lives 15 miles away, goes to gym 3 times  per week with her daughter Employment: on disability after TMJ surgery in 2007. 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 Her parents had marital discordance and the patient screamed when she was a child as she did not want to hear them. She reports good relationship with both of her parents otherwise. She has one sister, who she has good relationship with  Wt Readings from Last 3 Encounters:  10/19/21 199 lb (90.3 kg)  08/28/21 199 lb 3.2 oz (90.4 kg)  07/24/21 201 lb 14.4 oz (91.6 kg)     Daily routine: visits her daughter, who lives 15 miles away, goes to gym 3 times per week with her daughter Employment: on disability after TMJ surgery in 2007. 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 Her parents had marital discordance and the patient screamed when she was a child as she did not want to hear them. She reports good relationship with both of her parents otherwise. She has one sister, who she has good relationship with  Visit Diagnosis:    ICD-10-CM   1. MDD (major depressive disorder), recurrent, in partial remission ([place])  F33.41     2. Anxiety state  F41.1  Past Psychiatric History: Please see initial evaluation for full details. I have reviewed the history. No updates at this time.     Past Medical History:  Past Medical History:  Diagnosis Date   Acute pyelonephritis    Asthma    Bipolar disorder ([place])    Colon polyp    Complication of anesthesia    "sometimes hard to put to sleep"   Depression    Bipolar   Gastric ulcer    around 2013.  stress related   GERD (gastroesophageal reflux disease)    History of kidney stones    Hyperlipidemia    Hypothyroid    Polyp, uterus corpus    Sepsis due to Escherichia coli (E.  coli) ([place]) 09/04/2018   Sleep apnea    no cpap used   Stroke [facility])     Past Surgical History:  Procedure Laterality Date   ABLATION ON ENDOMETRIOSIS     COLONOSCOPY     Per patient, done around 2013 in [place], had polyp and overdue for follow up.   COLONOSCOPY WITH PROPOFOL N/A 05/12/2018   Procedure: COLONOSCOPY WITH PROPOFOL;  Surgeon: Daneil Dolin, MD;  Location: [facility];  Service: Endoscopy;  Laterality: N/A;  12:00pm   CYSTOSCOPY W/ URETERAL STENT PLACEMENT Left 09/01/2018   Procedure: CYSTOSCOPY WITH RETROGRADE PYELOGRAM/URETERAL STENT PLACEMENT;  Surgeon: Ceasar Mons, MD;  Location: [facility];  Service: Urology;  Laterality: Left;   CYSTOSCOPY/URETEROSCOPY/HOLMIUM LASER/STENT PLACEMENT Left 09/17/2018   Procedure: CYSTOSCOPY/URETEROSCOPY/HOLMIUM LASER/STENT PLACEMENT;  Surgeon: Ceasar Mons, MD;  Location: [facility];  Service: Urology;  Laterality: Left;  ONLY NEEDS 45 MIN   OVARIAN CYST SURGERY Left    POLYPECTOMY  05/12/2018   Procedure: POLYPECTOMY;  Surgeon: Daneil Dolin, MD;  Location: [facility];  Service: Endoscopy;;  polyp at splenic flexure   TEMPOROMANDIBULAR JOINT SURGERY     9 surgeries   TUBAL LIGATION     vaginal childbirth     1    Family Psychiatric History: Please see initial evaluation for full details. I have reviewed the history. No updates at this time.     Family History:  Family History  Problem Relation Age of Onset   Anxiety disorder Mother    Hypertension Mother    Heart failure Mother    Stroke Mother    Hyperlipidemia Father    Diabetes Father    Stroke Father    Hypertension Father    Colon polyps Father        older than 77   Diabetes Sister    Hypertension Sister    Ovarian cancer Sister    Asthma Daughter    Ovarian cancer Maternal Grandmother    Colon cancer Maternal Grandmother    Renal Disease Paternal Grandmother    Heart attack Paternal Grandfather    Bipolar disorder Other     Social  History:  Social History   Socioeconomic History   Marital status: Married    Spouse name: Not on file   Number of children: 1   Years of education: Not on file   Highest education level: High school graduate  Occupational History   Occupation: disability  Tobacco Use   Smoking status: Every Day    Packs/day: 0.50    Years: 30.00    Total pack years: 15.00    Types: Cigarettes   Smokeless tobacco: Never  Vaping Use   Vaping Use: Never used  Substance and Sexual Activity   Alcohol use:  Never   Drug use: Not Currently   Sexual activity: Yes    Partners: Male    Birth control/protection: Surgical    Comment: tubal  Other Topics Concern   Not on file  Social History Narrative   Not on file   Social Determinants of Health   Financial Resource Strain: High Risk (07/10/2021)   Overall Financial Resource Strain (CARDIA)    Difficulty of Paying Living Expenses: Hard  Food Insecurity: Food Insecurity Present (07/10/2021)   Hunger Vital Sign    Worried About Running Out of Food in the Last Year: Often true    Ran Out of Food in the Last Year: Often true  Transportation Needs: No Transportation Needs (07/10/2021)   PRAPARE - Hydrologist (Medical): No    Lack of Transportation (Non-Medical): No  Physical Activity: Insufficiently Active (07/10/2021)   Exercise Vital Sign    Days of Exercise per Week: 3 days    Minutes of Exercise per Session: 20 min  Stress: No Stress Concern Present (07/10/2021)   [place]    Feeling of Stress : Not at all  Social Connections: Moderately Isolated (07/10/2021)   Social Connection and Isolation Panel [NHANES]    Frequency of Communication with Friends and Family: More than three times a week    Frequency of Social Gatherings with Friends and Family: More than three times a week    Attends Religious Services: Never    Marine scientist or Organizations:  No    Attends Archivist Meetings: Never    Marital Status: Married    Allergies:  Allergies  Allergen Reactions   Lithium Other (See Comments)    Psoriasis     Metabolic Disorder Labs: Lab Results  Component Value Date   HGBA1C 5.2 02/20/2021   No results found for: "PROLACTIN" Lab Results  Component Value Date   CHOL 212 (H) 12/18/2018   TRIG 171 (H) 12/18/2018   HDL 44 12/18/2018   CHOLHDL 4.8 (H) 12/18/2018   LDLCALC 134 (H) 12/18/2018   LDLCALC 168 (H) 01/15/2018   Lab Results  Component Value Date   TSH 1.100 09/04/2021   TSH 0.772 02/20/2021    Therapeutic Level Labs: No results found for: "LITHIUM" No results found for: "VALPROATE" No results found for: "CBMZ"  Current Medications: Current Outpatient Medications  Medication Sig Dispense Refill   albuterol (VENTOLIN HFA) 108 (90 Base) MCG/ACT inhaler Inhale 1 puff into the lungs every 6 (six) hours as needed for wheezing or shortness of breath. 6.7 g 1   atorvastatin (LIPITOR) 80 MG tablet TAKE ONE TABLET AT BEDTIME 90 tablet 0   CALCIUM CITRATE-VITAMIN D3 PO Take 1 tablet by mouth daily.     [START ON 02/06/2022] Cariprazine HCl (VRAYLAR) 6 MG CAPS Take 1 capsule (6 mg total) by mouth daily. 90 capsule 0   Cholecalciferol (VITAMIN D3) 5000 units TABS Take 5,000 Units by mouth daily.      [START ON 01/29/2022] clonazePAM (KLONOPIN) 1 MG tablet Take 1 tablet (1 mg total) by mouth 2 (two) times daily. 60 tablet 1   DEXILANT 30 MG capsule DR TAKE (1) CAPSULE DAILY 90 capsule 3   DHS SAL 3 % SHAM Apply topically every morning.     famotidine (PEPCID) 20 MG tablet TAKE ONE TABLET BY MOUTH EVERY DAY 90 tablet 0   fluticasone (FLONASE) 50 MCG/ACT nasal spray SPRAY 2 SPRAYS INTO [facility]  NOSTRIL EVERY DAY 48 mL 0   fluticasone (FLOVENT HFA) 220 MCG/ACT inhaler Inhale 2 puffs into the lungs in the morning and at bedtime. 1 each 12   gabapentin (NEURONTIN) 300 MG capsule Take 600 mg by mouth at bedtime.      lamoTRIgine (LAMICTAL) 100 MG tablet Take 3 tablets (300 mg total) by mouth at bedtime. 270 tablet 1   levothyroxine (SYNTHROID) 50 MCG tablet TAKE 1 TABLET DAILY 90 tablet 1   loratadine (CLARITIN) 10 MG tablet TAKE 1 TABLET DAILY 30 tablet 11   magnesium gluconate (MAGONATE) 500 MG tablet Take 500 mg by mouth daily.     MODERNA COVID-19 BIVAL BOOSTER 50 MCG/0.5ML injection      Multiple Vitamins-Minerals (MULTIVITAMIN WOMEN PO) Take 1 tablet by mouth daily.     QUEtiapine (SEROQUEL) 200 MG tablet Take 1 tablet (200 mg total) by mouth at bedtime. 90 tablet 0   venlafaxine XR (EFFEXOR-XR) 150 MG 24 hr capsule Take 2 capsules (300 mg total) by mouth daily. 180 capsule 1   No current facility-administered medications for this visit.     Musculoskeletal: Strength & Muscle Tone:  N/A Gait & Station:  N/A Patient leans: N/A  Psychiatric Specialty Exam: Review of Systems  Psychiatric/Behavioral:  Positive for dysphoric mood. Negative for agitation, behavioral problems, confusion, decreased concentration, hallucinations, self-injury, sleep disturbance and suicidal ideas. The patient is nervous/anxious. The patient is not hyperactive.   All other systems reviewed and are negative.   Last menstrual period 12/13/2017.There is no height or weight on file to calculate BMI.  General Appearance: Fairly Groomed  Eye Contact:  Good  Speech:  Clear and Coherent  Volume:  Normal  Mood:   good   Affect:  Appropriate, Congruent, and calm  Thought Process:  Coherent  Orientation:  Full (Time, Place, and Person)  Thought Content: Logical   Suicidal Thoughts:  No  Homicidal Thoughts:  No  Memory:  Immediate;   Good  Judgement:  Good  Insight:  Good  Psychomotor Activity:  Normal  Concentration:  Concentration: Good and Attention Span: Good  Recall:  Good  Fund of Knowledge: Good  Language: Good  Akathisia:  No  Handed:  Right  AIMS (if indicated): not done  Assets:  Communication  Skills Desire for Improvement  ADL's:  Intact  Cognition: WNL  Sleep:  Good   Screenings: GAD-7    Flowsheet Row Office Visit from 08/28/2021 in [place] Visit from 02/20/2021 in [place] Visit from 06/29/2020 in [place] Office Visit from 08/19/2019 in [place] Visit from 04/16/2018 in [place]  Total GAD-7 Score 0 '6 10 11 3      '$ Mini-Mental    [place] Office Visit from 08/28/2021 in [place] from 07/22/2018 in [place]  Total Score (max 30 points ) 29 30      PHQ2-9    [place] Visit from 10/19/2021 in [place] Office Visit from 08/28/2021 in [place] Video Visit from 07/19/2021 in [place] from 07/10/2021 in [place] Visit from 02/20/2021 in [place]  PHQ-2 Total Score 1 0 1 0 4  PHQ-9 Total Score -- -- -- -- 9      [place] Office Visit from 10/19/2021 in [place] Video Visit from  11/15/2020 in [place] Video Visit from 08/18/2020 in [place] No Risk No Risk No Risk        Assessment and Plan:  LOWELLA KINDLEY is a 56 y.o. year old female with a history of  depression, hypothyroidism, obstructive sleep apnea (CPAP machine), stroke, Hyperlipidemia, Hypertension, stroke, who presents for follow up appointment for below.    1. MDD (major depressive disorder), recurrent, in partial remission ([place]) 2. Anxiety state # Bipolar I disorder by history There has been a steady improvement in depressive symptoms and anxiety, which coincided with improvement in the condition of  her mother-in-law with dementia despite tapering down the dose of quetiapine.  Psychosocial stressors includes being a caregiver of her mother-in-law, and marital conflict.  She is open to try lower dose of quetiapine to avoid polypharmacy.  Will continue venlafaxine, lamotrigine to target depression.  Will continue Vraylar to target depression and mood dysregulation. Noted that she has been on both quetiapine and Vraylar when she was first seen by this provider under the diagnosis of bipolar I disorder. Both the patient and her husband reports history of paranoia and irritability, visual hallucination in the context of depression in the past, and it is unclear whether she had manic symptoms otherwise. Unable to obtain record from her previous psychiatrist despite the attempt.  Will continue venlafaxine and lamotrigine to target depression.     Plan   Continue venlafaxine 300 mg daily Continue Lamotrigine 300 mg daily Continue vraylar 6 mg daily Decrease quetiapine 200 mg at night Continue clonazepam 1 mg twice a day as needed for anxiety  Next appointment: 11/1 at 2:30 for 30 mins, video- she reports interest in being transferred to [place] office due to technical difficulty in doing video visit. Obtain lab - lipid     Past trials of medication: sertraline, fluoxetine, lexapro, Celexa, duloxetine, Wellbutrin  lithium (psoriasis), Depakote (did not work), olanzapine, Abilify (weight gain), latuda, Geodon    The patient demonstrates the following risk factors for suicide: Chronic risk factors for suicide include: psychiatric disorder of depression. Acute risk factors for suicide include: family or marital conflict and unemployment. Protective factors for this patient include: positive social support and hope for the future. Considering these factors, the overall suicide risk at this point appears to be low. Patient is appropriate for outpatient follow up.   Discussed with the patient that  upcoming regulatory changes in Telehealth. The patient verbalized understanding that controlled substance will not be prescribed after March 17, 2022 unless the patient is able to do in person or video visit.      Collaboration of Care: Collaboration of Care: Other N/A  Patient/Guardian was advised Release of Information must be obtained prior to any record release in order to collaborate their care with an outside provider. Patient/Guardian was advised if they have not already done so to contact the registration department to sign all necessary forms in order for [place] to release information regarding their care.   Consent: Patient/Guardian gives verbal consent for treatment and assignment of benefits for services provided during this visit. Patient/Guardian expressed understanding and agreed to proceed.    Norman Clay, MD 01/17/2022, 1:38 PM

## 2022-01-16 ENCOUNTER — Other Ambulatory Visit: Payer: Self-pay | Admitting: Psychiatry

## 2022-01-17 ENCOUNTER — Ambulatory Visit (INDEPENDENT_AMBULATORY_CARE_PROVIDER_SITE_OTHER): Payer: Medicare Other | Admitting: Family Medicine

## 2022-01-17 ENCOUNTER — Telehealth (INDEPENDENT_AMBULATORY_CARE_PROVIDER_SITE_OTHER): Payer: Medicare Other | Admitting: Psychiatry

## 2022-01-17 ENCOUNTER — Ambulatory Visit (INDEPENDENT_AMBULATORY_CARE_PROVIDER_SITE_OTHER): Payer: Medicare Other

## 2022-01-17 ENCOUNTER — Other Ambulatory Visit: Payer: Self-pay | Admitting: Family Medicine

## 2022-01-17 ENCOUNTER — Other Ambulatory Visit: Payer: Self-pay | Admitting: Psychiatry

## 2022-01-17 ENCOUNTER — Encounter: Payer: Self-pay | Admitting: Family Medicine

## 2022-01-17 ENCOUNTER — Encounter: Payer: Self-pay | Admitting: Psychiatry

## 2022-01-17 VITALS — BP 125/69 | HR 89 | Temp 97.1°F | Resp 20 | Ht 65.5 in | Wt 199.0 lb

## 2022-01-17 DIAGNOSIS — Z23 Encounter for immunization: Secondary | ICD-10-CM

## 2022-01-17 DIAGNOSIS — F3341 Major depressive disorder, recurrent, in partial remission: Secondary | ICD-10-CM | POA: Diagnosis not present

## 2022-01-17 DIAGNOSIS — G2581 Restless legs syndrome: Secondary | ICD-10-CM | POA: Diagnosis not present

## 2022-01-17 DIAGNOSIS — W19XXXA Unspecified fall, initial encounter: Secondary | ICD-10-CM

## 2022-01-17 DIAGNOSIS — F411 Generalized anxiety disorder: Secondary | ICD-10-CM

## 2022-01-17 DIAGNOSIS — M533 Sacrococcygeal disorders, not elsewhere classified: Secondary | ICD-10-CM

## 2022-01-17 DIAGNOSIS — Z8619 Personal history of other infectious and parasitic diseases: Secondary | ICD-10-CM | POA: Diagnosis not present

## 2022-01-17 DIAGNOSIS — M7989 Other specified soft tissue disorders: Secondary | ICD-10-CM | POA: Diagnosis not present

## 2022-01-17 MED ORDER — CLONAZEPAM 1 MG PO TABS: 1.0000 mg | ORAL_TABLET | Freq: Two times a day (BID) | ORAL | 1 refills | Status: DC

## 2022-01-17 MED ORDER — QUETIAPINE FUMARATE 200 MG PO TABS: 200.0000 mg | ORAL_TABLET | Freq: Every day | ORAL | 0 refills | Status: DC

## 2022-01-17 MED ORDER — VRAYLAR 6 MG PO CAPS: 1.0000 | ORAL_CAPSULE | Freq: Every day | ORAL | 0 refills | Status: AC

## 2022-01-17 MED ORDER — GABAPENTIN 300 MG PO CAPS: ORAL_CAPSULE | ORAL | 0 refills | Status: DC

## 2022-01-17 MED ORDER — TIZANIDINE HCL 4 MG PO TABS: 2.0000 mg | ORAL_TABLET | Freq: Four times a day (QID) | ORAL | 0 refills | Status: DC | PRN

## 2022-01-17 NOTE — Progress Notes (Addendum)
Subjective: CC: Fall PCP: [name], DO JAS:NKNLZ H Test is a 56 y.o. female presenting to clinic today for:  1.  Fall Patient reports that she has been having ongoing low back and sacral pain since she fell off her bed and in between the bed and wall about 3 weeks ago.  She notes that she was pretty much bedridden for about 2 weeks but now is back to sitting, standing and walking around.  All positions kind of hurt.  She is really not doing anything for treatment including no oral analgesics, heat, topicals or donut pillow.  She notes that this is not the first fall off of her bed but she has not taken any steps to put rails in.  Sometimes she gets some right leg weakness so she felt like being evaluated was necessary  2.  Restless leg syndrome She is followed by Dr. Harrie Foreman for this.  She has not followed up with him because she "has not been called for appointment".  She is out of her gabapentin and asking for refills on this.  It does seem to control the restless leg syndrome even though it does not help much with pain.  3.  History of syphilis Patient would like to have repeat labs done.  Sometimes she gets some bumps on her genitals and this concerned her for recurrent infection.  She has history of positive syphilis but has been treated.  No recent intercourse and no known exposure to STI recently   ROS: Per HPI  Allergies  Allergen Reactions   Lithium Other (See Comments)    Psoriasis    Past Medical History:  Diagnosis Date   Acute pyelonephritis    Asthma    Bipolar disorder ([place])    Colon polyp    Complication of anesthesia    "sometimes hard to put to sleep"   Depression    Bipolar   Gastric ulcer    around 2013.  stress related   GERD (gastroesophageal reflux disease)    History of kidney stones    Hyperlipidemia    Hypothyroid    Polyp, uterus corpus    Sepsis due to Escherichia coli (E. coli) ([place]) 09/04/2018   Sleep apnea    no cpap used    Stroke [facility])     Current Outpatient Medications:    albuterol (VENTOLIN HFA) 108 (90 Base) MCG/ACT inhaler, Inhale 1 puff into the lungs every 6 (six) hours as needed for wheezing or shortness of breath., Disp: 6.7 g, Rfl: 1   atorvastatin (LIPITOR) 80 MG tablet, TAKE ONE TABLET AT BEDTIME, Disp: 90 tablet, Rfl: 0   CALCIUM CITRATE-VITAMIN D3 PO, Take 1 tablet by mouth daily., Disp: , Rfl:    [START ON 02/06/2022] Cariprazine HCl (VRAYLAR) 6 MG CAPS, Take 1 capsule (6 mg total) by mouth daily., Disp: 90 capsule, Rfl: 0   Cholecalciferol (VITAMIN D3) 5000 units TABS, Take 5,000 Units by mouth daily. , Disp: , Rfl:    [START ON 01/29/2022] clonazePAM (KLONOPIN) 1 MG tablet, Take 1 tablet (1 mg total) by mouth 2 (two) times daily., Disp: 60 tablet, Rfl: 1   DEXILANT 30 MG capsule DR, TAKE (1) CAPSULE DAILY, Disp: 90 capsule, Rfl: 3   DHS SAL 3 % SHAM, Apply topically every morning., Disp: , Rfl:    famotidine (PEPCID) 20 MG tablet, TAKE ONE TABLET BY MOUTH EVERY DAY, Disp: 90 tablet, Rfl: 0   fluticasone (FLONASE) 50 MCG/ACT nasal spray, SPRAY 2  SPRAYS INTO EACH NOSTRIL EVERY DAY, Disp: 48 mL, Rfl: 0   fluticasone (FLOVENT HFA) 220 MCG/ACT inhaler, Inhale 2 puffs into the lungs in the morning and at bedtime., Disp: 1 each, Rfl: 12   gabapentin (NEURONTIN) 300 MG capsule, Take 1 capsule at supper and 1 capsule at bedtime.  Further fills per Harrie Foreman, Neurology, Disp: 180 capsule, Rfl: 0   lamoTRIgine (LAMICTAL) 100 MG tablet, Take 3 tablets (300 mg total) by mouth at bedtime., Disp: 270 tablet, Rfl: 1   levothyroxine (SYNTHROID) 50 MCG tablet, TAKE 1 TABLET DAILY, Disp: 90 tablet, Rfl: 1   loratadine (CLARITIN) 10 MG tablet, TAKE 1 TABLET DAILY, Disp: 30 tablet, Rfl: 11   magnesium gluconate (MAGONATE) 500 MG tablet, Take 500 mg by mouth daily., Disp: , Rfl:    Multiple Vitamins-Minerals (MULTIVITAMIN WOMEN PO), Take 1 tablet by mouth daily., Disp: , Rfl:    QUEtiapine (SEROQUEL) 200 MG tablet,  Take 1 tablet (200 mg total) by mouth at bedtime., Disp: 90 tablet, Rfl: 0   tiZANidine (ZANAFLEX) 4 MG tablet, Take 0.5-1 tablets (2-4 mg total) by mouth every 6 (six) hours as needed for muscle spasms., Disp: 30 tablet, Rfl: 0   venlafaxine XR (EFFEXOR-XR) 150 MG 24 hr capsule, Take 2 capsules (300 mg total) by mouth daily., Disp: 180 capsule, Rfl: 1   MODERNA COVID-19 BIVAL BOOSTER 50 MCG/0.5ML injection, , Disp: , Rfl:  Social History   Socioeconomic History   Marital status: Married    Spouse name: Not on file   Number of children: 1   Years of education: Not on file   Highest education level: High school graduate  Occupational History   Occupation: disability  Tobacco Use   Smoking status: Every Day    Packs/day: 0.50    Years: 30.00    Total pack years: 15.00    Types: Cigarettes   Smokeless tobacco: Never  Vaping Use   Vaping Use: Never used  Substance and Sexual Activity   Alcohol use: Never   Drug use: Not Currently   Sexual activity: Yes    Partners: Male    Birth control/protection: Surgical    Comment: tubal  Other Topics Concern   Not on file  Social History Narrative   Not on file   Social Determinants of Health   Financial Resource Strain: High Risk (07/10/2021)   Overall Financial Resource Strain (CARDIA)    Difficulty of Paying Living Expenses: Hard  Food Insecurity: Food Insecurity Present (07/10/2021)   Hunger Vital Sign    Worried About Running Out of Food in the Last Year: Often true    Ran Out of Food in the Last Year: Often true  Transportation Needs: No Transportation Needs (07/10/2021)   PRAPARE - Hydrologist (Medical): No    Lack of Transportation (Non-Medical): No  Physical Activity: Insufficiently Active (07/10/2021)   Exercise Vital Sign    Days of Exercise per Week: 3 days    Minutes of Exercise per Session: 20 min  Stress: No Stress Concern Present (07/10/2021)   [place]    Feeling of Stress : Not at all  Social Connections: Moderately Isolated (07/10/2021)   Social Connection and Isolation Panel [NHANES]    Frequency of Communication with Friends and Family: More than three times a week    Frequency of Social Gatherings with Friends and Family: More than three times a week  Attends Religious Services: Never    Active Member of Clubs or Organizations: No    Attends Archivist Meetings: Never    Marital Status: Married  Human resources officer Violence: Not At Risk (07/10/2021)   Humiliation, Afraid, Rape, and Kick questionnaire    Fear of Current or Ex-Partner: No    Emotionally Abused: No    Physically Abused: No    Sexually Abused: No   Family History  Problem Relation Age of Onset   Anxiety disorder Mother    Hypertension Mother    Heart failure Mother    Stroke Mother    Hyperlipidemia Father    Diabetes Father    Stroke Father    Hypertension Father    Colon polyps Father        older than 64   Diabetes Sister    Hypertension Sister    Ovarian cancer Sister    Asthma Daughter    Ovarian cancer Maternal Grandmother    Colon cancer Maternal Grandmother    Renal Disease Paternal Grandmother    Heart attack Paternal Grandfather    Bipolar disorder Other     Objective: Office vital signs reviewed. BP 125/69   Pulse 89   Temp (!) 97.1 F (36.2 C) (Oral)   Resp 20   Ht 5' 5.5" (1.664 m)   Wt 199 lb (90.3 kg)   LMP 12/13/2017 (Approximate)   SpO2 97%   BMI 32.61 kg/m   Physical Examination:  General: Awake, alert, well nourished, No acute distress MSK: Gait slightly antalgic ambulating independently.  She has mild tenderness to palpation along the lumbar sacral region but no palpable bony abnormality.  DG Sacrum/Coccyx  Result Date: 01/17/2022 CLINICAL DATA:  Fall with soft tissue swelling and pain over sacrum. EXAM: SACRUM AND COCCYX - 2+ VIEW COMPARISON:  Pelvis radiographs 03/02/2021  FINDINGS: No definite acute fracture is seen. The soft tissues are unremarkable. The SI joints are intact. IMPRESSION: No definite fracture seen. Electronically Signed   By: Valetta Mole M.D.   On: 01/17/2022 15:28    Assessment/ Plan: 56 y.o. female   Sacral pain - Plan: DG Sacrum/Coccyx, tiZANidine (ZANAFLEX) 4 MG tablet  Fall, initial encounter - Plan: DG Sacrum/Coccyx  Restless leg syndrome - Plan: gabapentin (NEURONTIN) 300 MG capsule  Need for immunization against influenza - Plan: Flu Vaccine QUAD 55moIM (Fluarix, Fluzone & Alfiuria Quad PF)  History of positive serological reaction for syphilis - Plan: RPR, HSV(herpes simplex vrs) 1+2 ab-IgG  Plain films obtained given ongoing pain despite being several weeks out from initial fall.  Personal review demonstrated what seemed to be a little bit of an abnormality at the very tip of the sacrum but no definite fracture was seen by radiology.  We will treat as contusion.  Recommended heat, ice, donut pillow and have given her short course of muscle relaxers to have on hand if needed for associated lumbar spasm  She is overdue for follow-up with her neurologist for restless leg syndrome.  I have given her a bridge prescription for 90 days of her gabapentin.  Further refills per Dr. MSabra Heck Influenza vaccination was administered today  She wanted repeat syphilis and HSV testing.  These have been ordered  Orders Placed This Encounter  Procedures   DG Sacrum/Coccyx    Standing Status:   Future    Number of Occurrences:   1    Standing Expiration Date:   01/18/2023    Order Specific Question:   Reason  for Exam (SYMPTOM  OR DIAGNOSIS REQUIRED)    Answer:   fall with soft tissue swelling and pain over sacrum    Order Specific Question:   Is patient pregnant?    Answer:   No    Order Specific Question:   Preferred imaging location?    Answer:   Internal   Flu Vaccine QUAD 51moIM (Fluarix, Fluzone & Alfiuria Quad PF)   RPR    Standing  Status:   Future    Standing Expiration Date:   01/18/2023   HSV(herpes simplex vrs) 1+2 ab-IgG    Standing Status:   Future    Standing Expiration Date:   01/18/2023   Meds ordered this encounter  Medications   gabapentin (NEURONTIN) 300 MG capsule    Sig: Take 1 capsule at supper and 1 capsule at bedtime.  Further fills per KHarrie Foreman Neurology    Dispense:  180 capsule    Refill:  0   tiZANidine (ZANAFLEX) 4 MG tablet    Sig: Take 0.5-1 tablets (2-4 mg total) by mouth every 6 (six) hours as needed for muscle spasms.    Dispense:  30 tablet    Refill:  [email or web address]([phone]

## 2022-01-17 NOTE — Addendum Note (Signed)
Addended by: [name] on: 01/17/2022 04:28 PM   Modules accepted: Orders

## 2022-01-17 NOTE — Patient Instructions (Signed)
Heat, ice, donut pillow Will call with xray results Call Dr Ammie Ferrier office for appt so he can refill your restless legs meds.  I've sent a 90 day supply to get you through until your appointment

## 2022-01-22 ENCOUNTER — Inpatient Hospital Stay: Payer: Medicare Other | Attending: Hematology

## 2022-01-22 DIAGNOSIS — G4730 Sleep apnea, unspecified: Secondary | ICD-10-CM | POA: Diagnosis not present

## 2022-01-22 DIAGNOSIS — D751 Secondary polycythemia: Secondary | ICD-10-CM | POA: Insufficient documentation

## 2022-01-22 DIAGNOSIS — Z800 Family history of malignant neoplasm of digestive organs: Secondary | ICD-10-CM | POA: Diagnosis not present

## 2022-01-22 DIAGNOSIS — F17210 Nicotine dependence, cigarettes, uncomplicated: Secondary | ICD-10-CM | POA: Insufficient documentation

## 2022-01-22 DIAGNOSIS — Z8041 Family history of malignant neoplasm of ovary: Secondary | ICD-10-CM | POA: Diagnosis not present

## 2022-01-22 DIAGNOSIS — Z8673 Personal history of transient ischemic attack (TIA), and cerebral infarction without residual deficits: Secondary | ICD-10-CM | POA: Insufficient documentation

## 2022-01-22 LAB — CBC WITH DIFFERENTIAL/PLATELET
Abs Immature Granulocytes: 0.03 10*3/uL (ref 0.00–0.07)
Basophils Absolute: 0.1 10*3/uL (ref 0.0–0.1)
Basophils Relative: 1 %
Eosinophils Absolute: 0 10*3/uL (ref 0.0–0.5)
Eosinophils Relative: 0 %
HCT: 48.2 % — ABNORMAL HIGH (ref 36.0–46.0)
Hemoglobin: 15.9 g/dL — ABNORMAL HIGH (ref 12.0–15.0)
Immature Granulocytes: 0 %
Lymphocytes Relative: 20 %
Lymphs Abs: 2 10*3/uL (ref 0.7–4.0)
MCH: 34.6 pg — ABNORMAL HIGH (ref 26.0–34.0)
MCHC: 33 g/dL (ref 30.0–36.0)
MCV: 104.8 fL — ABNORMAL HIGH (ref 80.0–100.0)
Monocytes Absolute: 1 10*3/uL (ref 0.1–1.0)
Monocytes Relative: 9 %
Neutro Abs: 7.2 10*3/uL (ref 1.7–7.7)
Neutrophils Relative %: 70 %
Platelets: 182 10*3/uL (ref 150–400)
RBC: 4.6 MIL/uL (ref 3.87–5.11)
RDW: 15.3 % (ref 11.5–15.5)
WBC: 10.3 10*3/uL (ref 4.0–10.5)
nRBC: 0 % (ref 0.0–0.2)

## 2022-01-22 LAB — COMPREHENSIVE METABOLIC PANEL
ALT: 32 U/L (ref 0–44)
AST: 25 U/L (ref 15–41)
Albumin: 3.9 g/dL (ref 3.5–5.0)
Alkaline Phosphatase: 95 U/L (ref 38–126)
Anion gap: 7 (ref 5–15)
BUN: 12 mg/dL (ref 6–20)
CO2: 29 mmol/L (ref 22–32)
Calcium: 8.9 mg/dL (ref 8.9–10.3)
Chloride: 106 mmol/L (ref 98–111)
Creatinine, Ser: 0.94 mg/dL (ref 0.44–1.00)
GFR, Estimated: >60 mL/min (ref >60)
Glucose, Bld: 85 mg/dL (ref 70–99)
Potassium: 4.2 mmol/L (ref 3.5–5.1)
Sodium: 142 mmol/L (ref 135–145)
Total Bilirubin: 0.4 mg/dL (ref 0.3–1.2)
Total Protein: 6.7 g/dL (ref 6.5–8.1)

## 2022-01-22 LAB — LACTATE DEHYDROGENASE: LDH: 192 U/L (ref 98–192)

## 2022-01-23 ENCOUNTER — Inpatient Hospital Stay: Payer: Medicare Other

## 2022-01-24 ENCOUNTER — Other Ambulatory Visit: Payer: Self-pay | Admitting: Psychiatry

## 2022-01-24 ENCOUNTER — Other Ambulatory Visit: Payer: Self-pay | Admitting: Family Medicine

## 2022-01-24 ENCOUNTER — Telehealth: Payer: Self-pay

## 2022-01-24 DIAGNOSIS — F3341 Major depressive disorder, recurrent, in partial remission: Secondary | ICD-10-CM

## 2022-01-24 MED ORDER — QUETIAPINE FUMARATE 200 MG PO TABS: 200.0000 mg | ORAL_TABLET | Freq: Every day | ORAL | 0 refills | Status: DC

## 2022-01-24 NOTE — Telephone Encounter (Signed)
Pharmacy called states that patient needs a refill on the quetiapine and that we told the patient that they should have one from 01-17-22.  Per the pharmacy they do not have rx. Can you please resend it.   Outpatient Medication Detail   Disp Refills Start End   QUEtiapine (SEROQUEL) 200 MG tablet 90 tablet 0 01/17/2022 04/17/2022   Sig - Route: Take 1 tablet (200 mg total) by mouth at bedtime. - Oral   Sent to pharmacy as: QUEtiapine (SEROQUEL) 200 MG tablet   E-Prescribing Status: Receipt confirmed by pharmacy (01/17/2022  1:31 PM EDT)

## 2022-01-24 NOTE — Telephone Encounter (Signed)
left message that rx was resent to pharmacy.

## 2022-01-24 NOTE — Telephone Encounter (Signed)
I have sent Seroquel-new prescription to pharmacy.

## 2022-01-26 ENCOUNTER — Other Ambulatory Visit: Payer: Self-pay | Admitting: Psychiatry

## 2022-01-26 NOTE — Telephone Encounter (Signed)
Decrease quetiapine 200 mg at night - per Dr.Hisada - 01/17/2022.  This is from Dr.Hisada's notes. Will defer to [place].

## 2022-01-26 NOTE — Telephone Encounter (Signed)
phamarcy called left message that the patient needs a refill on the quetiapine '50mg'$ . that pt is out of it and needs refills pt last seen on 9-13 next appt 11-01.  Don't see anything that she decrease her to just '200mg'$ / when she been on '250mg'$ 

## 2022-01-26 NOTE — Telephone Encounter (Signed)
Decline. The dose was reduced to 200 mg at night.

## 2022-01-29 NOTE — Progress Notes (Unsigned)
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  She was last seen by Dr. Delton Coombes on 07/24/2021.  She has received phlebotomy every 2 months, most recently on 11/24/2021.  At today's visit, she reports feeling well.  No recent hospitalizations, surgeries, or changes in baseline health status.  Patient previously quit smoking after her stroke in May 2021, but reports that she started smoking again about a year ago, currently smoking 0.75 PPD cigarettes.  She reports that she is currently using her CPAP every night.  She denies any new CVA, MI, or blood clots since her last visit.  No aquagenic pruritus, Raynaud's, erythromelalgia, or other vasomotor symptoms.  No B symptoms.  She does not note any significant changes in her symptoms or improvement in energy after her phlebotomies.  She has 50% energy and 70% appetite. She endorses that she is maintaining a stable weight.   REVIEW OF SYSTEMS:  Review of Systems  Constitutional:  Positive for fatigue. Negative for appetite change, chills, diaphoresis, fever and unexpected weight change.  HENT:   Negative for lump/mass and nosebleeds.   Eyes:  Negative for eye problems.  Respiratory:  Negative for cough, hemoptysis and shortness of breath.   Cardiovascular:  Negative for chest pain, leg swelling and palpitations.  Gastrointestinal:  Positive for constipation. Negative for abdominal pain, blood in stool, diarrhea, nausea and vomiting.  Genitourinary:  Negative for hematuria.   Skin: Negative.   Neurological:  Negative for  dizziness, headaches and light-headedness.  Hematological:  Does not bruise/bleed easily.      PAST MEDICAL/SURGICAL HISTORY:  Past Medical History:  Diagnosis Date   Acute pyelonephritis    Asthma    Bipolar disorder ([place])    Colon polyp    Complication of anesthesia    "sometimes hard to put to sleep"   Depression    Bipolar   Gastric ulcer    around 2013.  stress related   GERD (gastroesophageal reflux disease)    History of kidney stones    Hyperlipidemia    Hypothyroid    Polyp, uterus corpus    Sepsis due to Escherichia coli (E. coli) ([place]) 09/04/2018   Sleep apnea    no cpap used   Stroke [facility])    Past Surgical History:  Procedure Laterality Date   ABLATION ON ENDOMETRIOSIS     COLONOSCOPY     Per patient, done around 2013 in [place], had polyp and overdue for follow up.   COLONOSCOPY WITH PROPOFOL N/A 05/12/2018   Procedure: COLONOSCOPY WITH PROPOFOL;  Surgeon: Daneil Dolin, MD;  Location: [facility];  Service: Endoscopy;  Laterality: N/A;  12:00pm   CYSTOSCOPY W/ URETERAL STENT PLACEMENT Left 09/01/2018   Procedure: CYSTOSCOPY WITH RETROGRADE PYELOGRAM/URETERAL STENT PLACEMENT;  Surgeon: Ceasar Mons, MD;  Location: [facility] ORS;  Service: Urology;  Laterality: Left;   CYSTOSCOPY/URETEROSCOPY/HOLMIUM LASER/STENT PLACEMENT Left 09/17/2018   Procedure: CYSTOSCOPY/URETEROSCOPY/HOLMIUM LASER/STENT PLACEMENT;  Surgeon: Ceasar Mons, MD;  Location: [facility];  Service: Urology;  Laterality: Left;  ONLY NEEDS 45 MIN   OVARIAN CYST SURGERY Left    POLYPECTOMY  05/12/2018   Procedure:  POLYPECTOMY;  Surgeon: Daneil Dolin, MD;  Location: [facility];  Service: Endoscopy;;  polyp at splenic flexure   TEMPOROMANDIBULAR JOINT SURGERY     9 surgeries   TUBAL LIGATION     vaginal childbirth     1     SOCIAL HISTORY:  Social History   Socioeconomic History   Marital status: Married    Spouse name: Not on file   Number of children: 1   Years of  education: Not on file   Highest education level: High school graduate  Occupational History   Occupation: disability  Tobacco Use   Smoking status: Every Day    Packs/day: 0.50    Years: 30.00    Total pack years: 15.00    Types: Cigarettes   Smokeless tobacco: Never  Vaping Use   Vaping Use: Never used  Substance and Sexual Activity   Alcohol use: Never   Drug use: Not Currently   Sexual activity: Yes    Partners: Male    Birth control/protection: Surgical    Comment: tubal  Other Topics Concern   Not on file  Social History Narrative   Not on file   Social Determinants of Health   Financial Resource Strain: High Risk (07/10/2021)   Overall Financial Resource Strain (CARDIA)    Difficulty of Paying Living Expenses: Hard  Food Insecurity: Food Insecurity Present (07/10/2021)   Hunger Vital Sign    Worried About Running Out of Food in the Last Year: Often true    Ran Out of Food in the Last Year: Often true  Transportation Needs: No Transportation Needs (07/10/2021)   PRAPARE - Hydrologist (Medical): No    Lack of Transportation (Non-Medical): No  Physical Activity: Insufficiently Active (07/10/2021)   Exercise Vital Sign    Days of Exercise per Week: 3 days    Minutes of Exercise per Session: 20 min  Stress: No Stress Concern Present (07/10/2021)   [place]    Feeling of Stress : Not at all  Social Connections: Moderately Isolated (07/10/2021)   Social Connection and Isolation Panel [NHANES]    Frequency of Communication with Friends and Family: More than three times a week    Frequency of Social Gatherings with Friends and Family: More than three times a week    Attends Religious Services: Never    Marine scientist or Organizations: No    Attends Archivist Meetings: Never    Marital Status: Married  Human resources officer Violence: Not At Risk (07/10/2021)    Humiliation, Afraid, Rape, and Kick questionnaire    Fear of Current or Ex-Partner: No    Emotionally Abused: No    Physically Abused: No    Sexually Abused: No    FAMILY HISTORY:  Family History  Problem Relation Age of Onset   Anxiety disorder Mother    Hypertension Mother    Heart failure Mother    Stroke Mother    Hyperlipidemia Father    Diabetes Father    Stroke Father    Hypertension Father    Colon polyps Father        older than 54   Diabetes Sister    Hypertension Sister    Ovarian cancer Sister    Asthma Daughter    Ovarian cancer Maternal Grandmother    Colon cancer Maternal Grandmother    Renal Disease Paternal Grandmother    Heart attack Paternal  Grandfather    Bipolar disorder Other     CURRENT MEDICATIONS:  Outpatient Encounter Medications as of 01/30/2022  Medication Sig   albuterol (VENTOLIN HFA) 108 (90 Base) MCG/ACT inhaler Inhale 1 puff into the lungs every 6 (six) hours as needed for wheezing or shortness of breath.   atorvastatin (LIPITOR) 80 MG tablet TAKE ONE TABLET AT BEDTIME   CALCIUM CITRATE-VITAMIN D3 PO Take 1 tablet by mouth daily.   [START ON 02/06/2022] Cariprazine HCl (VRAYLAR) 6 MG CAPS Take 1 capsule (6 mg total) by mouth daily.   Cholecalciferol (VITAMIN D3) 5000 units TABS Take 5,000 Units by mouth daily.    clonazePAM (KLONOPIN) 1 MG tablet Take 1 tablet (1 mg total) by mouth 2 (two) times daily.   DEXILANT 30 MG capsule DR TAKE (1) CAPSULE DAILY   DHS SAL 3 % SHAM Apply topically every morning.   famotidine (PEPCID) 20 MG tablet TAKE ONE TABLET BY MOUTH EVERY DAY   fluticasone (FLONASE) 50 MCG/ACT nasal spray SPRAY 2 SPRAYS INTO EACH NOSTRIL EVERY DAY   fluticasone (FLOVENT HFA) 220 MCG/ACT inhaler Inhale 2 puffs into the lungs in the morning and at bedtime.   gabapentin (NEURONTIN) 300 MG capsule Take 1 capsule at supper and 1 capsule at bedtime.  Further fills per Harrie Foreman, Neurology   lamoTRIgine (LAMICTAL) 100 MG tablet Take  3 tablets (300 mg total) by mouth at bedtime.   levothyroxine (SYNTHROID) 50 MCG tablet TAKE 1 TABLET DAILY   loratadine (CLARITIN) 10 MG tablet TAKE 1 TABLET DAILY   magnesium gluconate (MAGONATE) 500 MG tablet Take 500 mg by mouth daily.   MODERNA COVID-19 BIVAL BOOSTER 50 MCG/0.5ML injection  (Patient not taking: Reported on 01/17/2022)   Multiple Vitamins-Minerals (MULTIVITAMIN WOMEN PO) Take 1 tablet by mouth daily.   QUEtiapine (SEROQUEL) 200 MG tablet Take 1 tablet (200 mg total) by mouth at bedtime.   tiZANidine (ZANAFLEX) 4 MG tablet Take 0.5-1 tablets (2-4 mg total) by mouth every 6 (six) hours as needed for muscle spasms.   venlafaxine XR (EFFEXOR-XR) 150 MG 24 hr capsule Take 2 capsules (300 mg total) by mouth daily.   No facility-administered encounter medications on file as of 01/30/2022.    ALLERGIES:  Allergies  Allergen Reactions   Lithium Other (See Comments)    Psoriasis      PHYSICAL EXAM:  ECOG PERFORMANCE STATUS: 1 - Symptomatic but completely ambulatory  There were no vitals filed for this visit. There were no vitals filed for this visit. Physical Exam   LABORATORY DATA:  I have reviewed the labs as listed.  CBC    Component Value Date/Time   WBC 10.3 01/22/2022 1443   RBC 4.60 01/22/2022 1443   HGB 15.9 (H) 01/22/2022 1443   HGB 16.1 (H) 07/06/2020 1447   HCT 48.2 (H) 01/22/2022 1443   HCT 46.9 (H) 07/06/2020 1447   PLT 182 01/22/2022 1443   PLT 197 07/06/2020 1447   MCV 104.8 (H) 01/22/2022 1443   MCV 99 (H) 07/06/2020 1447   MCH 34.6 (H) 01/22/2022 1443   MCHC 33.0 01/22/2022 1443   RDW 15.3 01/22/2022 1443   RDW 12.5 07/06/2020 1447   LYMPHSABS 2.0 01/22/2022 1443   LYMPHSABS 1.7 07/06/2020 1447   MONOABS 1.0 01/22/2022 1443   EOSABS 0.0 01/22/2022 1443   EOSABS 0.0 07/06/2020 1447   BASOSABS 0.1 01/22/2022 1443   BASOSABS 0.0 07/06/2020 1447      Latest Ref Rng & Units 01/22/2022  2:43 PM 09/04/2021    1:24 PM 07/17/2021    1:14 PM   CMP  Glucose 70 - 99 mg/dL 85  168  115   BUN 6 - 20 mg/dL '12  16  19   '$ Creatinine 0.44 - 1.00 mg/dL 0.94  1.08  0.95   Sodium 135 - 145 mmol/L 142  146  140   Potassium 3.5 - 5.1 mmol/L 4.2  4.8  4.3   Chloride 98 - 111 mmol/L 106  102  101   CO2 22 - 32 mmol/L '29  29  30   '$ Calcium 8.9 - 10.3 mg/dL 8.9  9.8  9.3   Total Protein 6.5 - 8.1 g/dL 6.7   6.7   Total Bilirubin 0.3 - 1.2 mg/dL 0.4   0.5   Alkaline Phos 38 - 126 U/L 95   92   AST 15 - 41 U/L 25   21   ALT 0 - 44 U/L 32   28     DIAGNOSTIC IMAGING:  I have independently reviewed the relevant imaging and discussed with the patient.  ASSESSMENT & PLAN: 1.  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 - JAK2 V617F and reflex mutation testing was negative. - Erythropoietin was 15.9. White count and platelet count was normal. - History of chronic headaches. - Current everyday smoker, 0.75 PPD cigarettes.  (Had briefly quit in May 2021 at the time of her stroke) - She also has sleep apnea and reports good use of CPAP - CT renal study on 09/01/2018 showed mild left hydroureteronephrosis due to 4 mm mid left ureteral calculus. Tiny left renal calculus. Severe hepatic steatosis. - She has received phlebotomy every 2 months, most recently on 11/24/2021. - She does not note any improvement in energy or symptoms after phlebotomies - She denies any aquagenic pruritus, erythromelalgia, or other vasomotor symptoms.   - Labs (01/22/2022): Hgb 15.9, hematocrit 48.2%.  Normal LDH.  CMP normal. - Discussed the primary treatment of secondary polycythemia and treatment of the underlying condition.  Discussed that phlebotomy may have some use for symptom relief or incidence of hematocrit >54.0 to prevent VTE, MI, CVA - PLAN: We will hold phlebotomies for the time being, since patient has HCT <54.0 and does not  have presence of any severe vasomotor symptoms, and denies any change in symptoms after phlebotomies. - Discussed that primary treatment of secondary polycythemia is treatment of the underlying condition.  In this case, recommend continued use of CPAP as well as smoking cessation (education provided). - We will recheck labs and schedule for possible phlebotomy and office visit in 4 months.  2.  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 ABG showed PO2 of 51 and PCO2 of 52. Elevated carboxyhemoglobin was out of proportion to her smoking. - Although etiology of her elevated carboxyhemoglobin is unknown, there have been some studies that have (and consistently) length sleep apnea to elevated carbon monoxide levels. - Patient denies any obvious exposure to carbon monoxide.   - PLAN: We will recheck carbon monoxide and carboxyhemoglobin levels at next appointment.  3.  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 Right hemiparesis improved as did her speech. She continues to have some word finding difficulties. - MR angiogram of the head  and neck region was done on 10/12/2019. She is on aspirin 325 mg daily. - PLAN: Continue aspirin 325 mg daily    PLAN SUMMARY & DISPOSITION: Labs in 4 months Same-day office visit + possible phlebotomy 1 week after labs  All questions were answered. The patient knows to call the clinic with any problems, questions or concerns.  Medical decision making: Low  Time spent on visit: I spent 15 minutes counseling the patient face to face. The total time spent in the appointment was 22 minutes and more than 50% was on counseling.   Harriett Rush, PA-C  01/31/2022 12:09 AM

## 2022-01-30 ENCOUNTER — Inpatient Hospital Stay (HOSPITAL_BASED_OUTPATIENT_CLINIC_OR_DEPARTMENT_OTHER): Payer: Medicare Other | Admitting: Physician Assistant

## 2022-01-30 ENCOUNTER — Inpatient Hospital Stay: Payer: Medicare Other

## 2022-01-30 VITALS — Ht 65.0 in | Wt 190.2 lb

## 2022-01-30 DIAGNOSIS — T5891XD Toxic effect of carbon monoxide from unspecified source, accidental (unintentional), subsequent encounter: Secondary | ICD-10-CM | POA: Diagnosis not present

## 2022-01-30 DIAGNOSIS — Z8673 Personal history of transient ischemic attack (TIA), and cerebral infarction without residual deficits: Secondary | ICD-10-CM | POA: Diagnosis not present

## 2022-01-30 DIAGNOSIS — D751 Secondary polycythemia: Secondary | ICD-10-CM | POA: Diagnosis not present

## 2022-01-30 DIAGNOSIS — G4730 Sleep apnea, unspecified: Secondary | ICD-10-CM | POA: Diagnosis not present

## 2022-01-30 DIAGNOSIS — Z800 Family history of malignant neoplasm of digestive organs: Secondary | ICD-10-CM | POA: Diagnosis not present

## 2022-01-30 DIAGNOSIS — F17210 Nicotine dependence, cigarettes, uncomplicated: Secondary | ICD-10-CM | POA: Diagnosis not present

## 2022-01-30 NOTE — Patient Instructions (Signed)
[facility] **   You were seen today by Tarri Abernethy PA-C for your elevated red blood cells ("erythrocytosis").    ELEVATED RED BLOOD CELLS This is most likely related to your tobacco use and sleep apnea. Continue to work on smoking cessation and using your CPAP each night. Continue to take aspirin daily to decrease risk of blood clots, heart attacks, and stroke from your elevated red blood cells. Your red blood cells are only mildly elevated right now, so we will hold off on any phlebotomy until your next visit. We will recheck your labs (including carbon monoxide levels) in 4 months.  TOBACCO USE Please read through the attached handouts on smoking cessation. This will help to improve your blood counts and your overall health. You can also go online and search for [employer].  This will take you to a webpage with information about free classes on smoking cessation offered through [facility].  FOLLOW-UP APPOINTMENT: Labs in 4 months with office visit and possible phlebotomy 1 week after.  ** Thank you for trusting me with your healthcare!  I strive to provide all of my patients with quality care at each visit.  If you receive a survey for this visit, I would be so grateful to you for taking the time to provide feedback.  Thank you in advance!  ~ Sheri Gatchel                   Dr. Derek Jack   &   Tarri Abernethy, PA-C   - - - - - - - - - - - - - - - - - -    Thank you for choosing [facility] to provide your oncology and hematology care.  To afford each patient quality time with our provider, please arrive at least 15 minutes before your scheduled appointment time.   If you have a lab appointment with the [facility] please come in thru the Main Entrance and check in at the main information desk.  You need to re-schedule your appointment should you  arrive 10 or more minutes late.  We strive to give you quality time with our providers, and arriving late affects you and other patients whose appointments are after yours.  Also, if you no show three or more times for appointments you may be dismissed from the clinic at the providers discretion.     Again, thank you for choosing [facility].  Our hope is that these requests will decrease the amount of time that you wait before being seen by our physicians.       _____________________________________________________________  Should you have questions after your visit to [facility], please contact our office at [phone] and follow the prompts.  Our office hours are 8:00 a.m. and 4:30 p.m. Monday - Friday.  Please note that voicemails left after 4:00 p.m. may not be returned until the following business day.  We are closed weekends and major holidays.  You do have access to a nurse 24-7, just call the main number to the clinic [phone] and do not press any options, hold on the line and a nurse will answer the phone.    For prescription refill requests, have your pharmacy contact our office and allow 72 hours.

## 2022-01-30 NOTE — Patient Instructions (Signed)
[facility]  Discharge Instructions: Thank you for choosing [facility] to provide your oncology and hematology care.  If you have a lab appointment with the [facility], please come in thru the Main Entrance and check in at the main information desk.  Wear comfortable clothing and clothing appropriate for easy access to any Portacath or PICC line.   We strive to give you quality time with your provider. You may need to reschedule your appointment if you arrive late (15 or more minutes).  Arriving late affects you and other patients whose appointments are after yours.  Also, if you miss three or more appointments without notifying the office, you may be dismissed from the clinic at the provider's discretion.      For prescription refill requests, have your pharmacy contact our office and allow 72 hours for refills to be completed.    Today you received the following therapeutic phlebotomy, return as scheduled.   To help prevent nausea and vomiting after your treatment, we encourage you to take your nausea medication as directed.  BELOW ARE SYMPTOMS THAT SHOULD BE REPORTED IMMEDIATELY: *FEVER GREATER THAN 100.4 F (38 C) OR HIGHER *CHILLS OR SWEATING *NAUSEA AND VOMITING THAT IS NOT CONTROLLED WITH YOUR NAUSEA MEDICATION *UNUSUAL SHORTNESS OF BREATH *UNUSUAL BRUISING OR BLEEDING *URINARY PROBLEMS (pain or burning when urinating, or frequent urination) *BOWEL PROBLEMS (unusual diarrhea, constipation, pain near the anus) TENDERNESS IN MOUTH AND THROAT WITH OR WITHOUT PRESENCE OF ULCERS (sore throat, sores in mouth, or a toothache) UNUSUAL RASH, SWELLING OR PAIN  UNUSUAL VAGINAL DISCHARGE OR ITCHING   Items with * indicate a potential emergency and should be followed up as soon as possible or go to the Emergency Department if any problems should occur.  Please show the CHEMOTHERAPY ALERT CARD or IMMUNOTHERAPY ALERT CARD at check-in to the Emergency  Department and triage nurse.  Should you have questions after your visit or need to cancel or reschedule your appointment, please contact [facility] [phone]  and follow the prompts.  Office hours are 8:00 a.m. to 4:30 p.m. Monday - Friday. Please note that voicemails left after 4:00 p.m. may not be returned until the following business day.  We are closed weekends and major holidays. You have access to a nurse at all times for urgent questions. Please call the main number to the clinic [phone] and follow the prompts.  For any non-urgent questions, you may also contact your provider using MyChart. We now offer e-Visits for anyone 72 and older to request care online for non-urgent symptoms. For details visit [email or web address].   Also download the MyChart app! Go to the app store, search "MyChart", open the app, select [facility], and log in with your MyChart username and password.  Masks are optional in the cancer centers. If you would like for your care team to wear a mask while they are taking care of you, please let them know. You may have one support person who is at least 57 years old accompany you for your appointments.

## 2022-01-30 NOTE — Progress Notes (Signed)
Rebecca Moreno presents today for phlebotomy per MD orders. Patient reports eating before she came. Phlebotomy procedure started at 1307 and ended at 1315 500 cc removed. Patient tolerated procedure well. IV needle removed intact. Patient reports slight dizziness, patient laid back in chair. Patient monitored 30 minutes post phlebotomy and reports feeling better.  Patient assessed by Tarri Abernethy, PA for doctors visit after phlebotomy, and discharged in satisfactory condition.

## 2022-02-09 DIAGNOSIS — G4733 Obstructive sleep apnea (adult) (pediatric): Secondary | ICD-10-CM | POA: Diagnosis not present

## 2022-02-09 DIAGNOSIS — R0902 Hypoxemia: Secondary | ICD-10-CM | POA: Diagnosis not present

## 2022-02-09 DIAGNOSIS — J449 Chronic obstructive pulmonary disease, unspecified: Secondary | ICD-10-CM | POA: Diagnosis not present

## 2022-02-21 ENCOUNTER — Ambulatory Visit (INDEPENDENT_AMBULATORY_CARE_PROVIDER_SITE_OTHER): Payer: Medicare Other | Admitting: Family Medicine

## 2022-02-21 ENCOUNTER — Encounter: Payer: Self-pay | Admitting: Family Medicine

## 2022-02-21 ENCOUNTER — Other Ambulatory Visit (HOSPITAL_COMMUNITY)
Admission: RE | Admit: 2022-02-21 | Discharge: 2022-02-21 | Disposition: A | Discharge status: Home / Self Care | Payer: Medicare Other | Source: Ambulatory Visit | Attending: Family Medicine | Admitting: Family Medicine

## 2022-02-21 VITALS — BP 131/62 | HR 81 | Temp 98.1°F | Ht 65.0 in | Wt 198.2 lb

## 2022-02-21 DIAGNOSIS — N898 Other specified noninflammatory disorders of vagina: Secondary | ICD-10-CM | POA: Diagnosis present

## 2022-02-21 DIAGNOSIS — R499 Unspecified voice and resonance disorder: Secondary | ICD-10-CM | POA: Diagnosis not present

## 2022-02-21 DIAGNOSIS — J069 Acute upper respiratory infection, unspecified: Secondary | ICD-10-CM | POA: Diagnosis not present

## 2022-02-21 DIAGNOSIS — Z720 Tobacco use: Secondary | ICD-10-CM

## 2022-02-21 DIAGNOSIS — J040 Acute laryngitis: Secondary | ICD-10-CM | POA: Diagnosis not present

## 2022-02-21 LAB — RSV AG, IMMUNOCHR, WAIVED: RSV Ag, Immunochr, Waived: NEGATIVE

## 2022-02-21 LAB — VERITOR FLU A/B WAIVED
Influenza A: NEGATIVE
Influenza B: NEGATIVE

## 2022-02-21 LAB — WET PREP FOR TRICH, YEAST, CLUE
Clue Cell Exam: NEGATIVE
Trichomonas Exam: NEGATIVE
Yeast Exam: NEGATIVE

## 2022-02-21 NOTE — Progress Notes (Signed)
Subjective: CC: Laryngitis PCP: [name], DO WUJ:[id number] Rebecca Moreno is a 56 y.o. female presenting to clinic today for:  1.  Laryngitis Patient reports a 51-monthhistory of change in voice.  She denies any preceding illness.  She is an active every day smoker and has been so for over 20 years.  Currently smoking half a pack to 3/4 pack/day.  Denies any hemoptysis.  No difficulty swallowing.  No sore throat or choking.  2.  Vaginal irritation Patient reports vaginal irritation.  She wonders if she has syphilis again.  Denies any vaginal discharge.  Would like to be screened for vaginal infections again   ROS: Per HPI  Allergies  Allergen Reactions   Lithium Other (See Comments)    Psoriasis    Past Medical History:  Diagnosis Date   Acute pyelonephritis    Asthma    Bipolar disorder ([facility]    Colon polyp    Complication of anesthesia    "sometimes hard to put to sleep"   Depression    Bipolar   Gastric ulcer    around 2013.  stress related   GERD (gastroesophageal reflux disease)    History of kidney stones    Hyperlipidemia    Hypothyroid    Polyp, uterus corpus    Sepsis due to Escherichia coli (E. coli) [place] 09/04/[place]   Sleep apnea    no cpap used   Stroke ([facility]     Current Outpatient Medications:    albuterol (VENTOLIN HFA) 108 (90 Base) MCG/ACT inhaler, Inhale 1 puff into the lungs every 6 (six) hours as needed for wheezing or shortness of breath., Disp: 6.7 g, Rfl: 1   atorvastatin (LIPITOR) 80 MG tablet, TAKE ONE TABLET AT BEDTIME, Disp: 30 tablet, Rfl: 0   CALCIUM CITRATE-VITAMIN D3 PO, Take 1 tablet by mouth daily., Disp: , Rfl:    Cariprazine HCl (VRAYLAR) 6 MG CAPS, Take 1 capsule (6 mg total) by mouth daily., Disp: 90 capsule, Rfl: 0   Cholecalciferol (VITAMIN D3) 5000 units TABS, Take 5,000 Units by mouth daily. , Disp: , Rfl:    clonazePAM (KLONOPIN) 1 MG tablet, Take 1 tablet (1 mg total) by mouth 2 (two) times daily., Disp: 60 tablet, Rfl:  1   DEXILANT 30 MG capsule DR, TAKE (1) CAPSULE DAILY, Disp: 90 capsule, Rfl: 3   DHS SAL 3 % SHAM, Apply topically every morning., Disp: , Rfl:    famotidine (PEPCID) 20 MG tablet, TAKE ONE TABLET BY MOUTH EVERY DAY, Disp: 90 tablet, Rfl: 0   fluticasone (FLONASE) 50 MCG/ACT nasal spray, SPRAY 2 SPRAYS INTO EACH NOSTRIL EVERY DAY, Disp: 48 mL, Rfl: 0   fluticasone (FLOVENT HFA) 220 MCG/ACT inhaler, Inhale 2 puffs into the lungs in the morning and at bedtime., Disp: 1 each, Rfl: 12   gabapentin (NEURONTIN) 300 MG capsule, Take 1 capsule at supper and 1 capsule at bedtime.  Further fills per [facility], Disp: 180 capsule, Rfl: 0   lamoTRIgine (LAMICTAL) 100 MG tablet, Take 3 tablets (300 mg total) by mouth at bedtime., Disp: 270 tablet, Rfl: 1   levothyroxine (SYNTHROID) 50 MCG tablet, TAKE 1 TABLET DAILY, Disp: 90 tablet, Rfl: 1   loratadine (CLARITIN) 10 MG tablet, TAKE 1 TABLET DAILY, Disp: 30 tablet, Rfl: 11   magnesium gluconate (MAGONATE) 500 MG tablet, Take 500 mg by mouth daily., Disp: , Rfl:    MODERNA COVID-19 BIVAL BOOSTER 50 MCG/0.5ML injection, , Disp: , Rfl:  Multiple Vitamins-Minerals (MULTIVITAMIN WOMEN PO), Take 1 tablet by mouth daily., Disp: , Rfl:    QUEtiapine (SEROQUEL) 200 MG tablet, Take 1 tablet (200 mg total) by mouth at bedtime., Disp: 90 tablet, Rfl: 0   tiZANidine (ZANAFLEX) 4 MG tablet, Take 0.5-1 tablets (2-4 mg total) by mouth every 6 (six) hours as needed for muscle spasms., Disp: 30 tablet, Rfl: 0   venlafaxine XR (EFFEXOR-XR) 150 MG 24 hr capsule, Take 2 capsules (300 mg total) by mouth daily., Disp: 180 capsule, Rfl: 1 Social History   Socioeconomic History   Marital status: Married    Spouse name: Not on file   Number of children: 1   Years of education: Not on file   Highest education level: High school graduate  Occupational History   Occupation: disability  Tobacco Use   Smoking status: Every Day    Packs/day: 0.50    Years: 30.00     Total pack years: 15.00    Types: Cigarettes   Smokeless tobacco: Never  Vaping Use   Vaping Use: Never used  Substance and Sexual Activity   Alcohol use: Never   Drug use: Not Currently   Sexual activity: Yes    Partners: Male    Birth control/protection: Surgical    Comment: tubal  Other Topics Concern   Not on file  Social History Narrative   Not on file   Social Determinants of Health   Financial Resource Strain: High Risk (07/10/2021)   Overall Financial Resource Strain (CARDIA)    Difficulty of Paying Living Expenses: Hard  Food Insecurity: Food Insecurity Present (07/10/2021)   Hunger Vital Sign    Worried About Running Out of Food in the Last Year: Often true    Ran Out of Food in the Last Year: Often true  Transportation Needs: No Transportation Needs (07/10/2021)   PRAPARE - Hydrologist (Medical): No    Lack of Transportation (Non-Medical): No  Physical Activity: Insufficiently Active (07/10/2021)   Exercise Vital Sign    Days of Exercise per Week: 3 days    Minutes of Exercise per Session: 20 min  Stress: No Stress Concern Present (07/10/2021)   Independence    Feeling of Stress : Not at all  Social Connections: Moderately Isolated (07/10/2021)   Social Connection and Isolation Panel [NHANES]    Frequency of Communication with Friends and Family: More than three times a week    Frequency of Social Gatherings with Friends and Family: More than three times a week    Attends Religious Services: Never    Marine scientist or Organizations: No    Attends Archivist Meetings: Never    Marital Status: Married  Human resources officer Violence: Not At Risk (07/10/2021)   Humiliation, Afraid, Rape, and Kick questionnaire    Fear of Current or Ex-Partner: No    Emotionally Abused: No    Physically Abused: No    Sexually Abused: No   Family History  Problem Relation Age of Onset    Anxiety disorder Mother    Hypertension Mother    Heart failure Mother    Stroke Mother    Hyperlipidemia Father    Diabetes Father    Stroke Father    Hypertension Father    Colon polyps Father        older than 4   Diabetes Sister    Hypertension Sister    Ovarian cancer  Sister    Asthma Daughter    Ovarian cancer Maternal Grandmother    Colon cancer Maternal Grandmother    Renal Disease Paternal Grandmother    Heart attack Paternal Grandfather    Bipolar disorder Other     Objective: Office vital signs reviewed. LMP 12/13/2017 (Approximate)   Physical Examination:  General: Awake, alert, nontoxic female, No acute distress HEENT: Normal.  Hoarse quality to voice    Neck: No masses palpated. No lymphadenopathy    Throat: moist mucus membranes, no erythema, no tonsillar exudate.  Airway is patent    Assessment/ Plan: 56 y.o. female   Laryngitis - Plan: RSV Ag, Immunochr, Waived, Veritor Flu A/B Waived, Ambulatory referral to ENT, CANCELED: Novel Coronavirus, NAA ([facility])  Change in voice - Plan: Ambulatory referral to ENT  Tobacco use - Plan: Ambulatory referral to ENT  Vaginal lesion - Plan: WET PREP FOR [place], Cytology (oral, anal, urethral) ancillary only, CANCELED: GC probe amplification, urine, CANCELED: Urine cytology ancillary only, CANCELED: Urine cytology ancillary only  Very concerning that she has lost her voice and she is an active smoker.  Stat referral to ENT placed.  Physical examination was unremarkable today.  Viral testing negative  Wet prep without any evidence of active infection.  GC chlamydia cytology sent.  I again reiterated that the RPR will always be positive given her history of infection.  She is not sexually active so I do not believe that she will be reinfected.  We also discussed that syphilis lesions are nonpainful.  No orders of the defined types were placed in this encounter.  No orders of the defined types were  placed in this encounter.    [name], DO [place] [phone]

## 2022-02-21 NOTE — Patient Instructions (Signed)
Laryngitis  Laryngitis is irritation and swelling (inflammation) of your vocal cords. It causes your voice to sound hoarse and may cause you to lose your voice. Depending on the cause, this condition may go away after a short time or may last for more than 3 weeks. Treatment often involves resting your voice and using medicines to soothe your throat. What are the causes? Laryngitis that lasts for a short time may be caused by: An infection caused by a virus. Lots of talking, yelling, or singing. This is also called vocal strain. An infection caused by bacteria. Laryngitis that lasts for more than 3 weeks can be caused by: Lots of talking, yelling, or singing. An injury to the vocal cords. Acid reflux. Allergies. Sinus infection. Mucus draining from the nose down the throat (postnasal drip). Smoking. Drinking too much alcohol. Breathing in chemicals or dust. Having growths on the vocal cords. What increases the risk? Smoking. Drinking too much alcohol. Having allergies. Breathing in fumes at work. What are the signs or symptoms? A change in your voice. It may sound low and hoarse. Loss of voice. Dry cough. Sore throat. Dry throat. Stuffy nose. How is this treated? Treatment depends on what is causing the laryngitis. Usually, treatment includes: Resting your voice. Using medicines to soothe your throat. If your laryngitis is caused by an infection from bacteria, you may need to take antibiotics. If your laryngitis is caused by a growth on your vocal cords, you may need to have a surgery to remove it. Follow these instructions at home: Medicines Take over-the-counter and prescription medicines only as told by your doctor. If you were prescribed an antibiotic medicine, take it as told by your doctor. Do not stop taking it even if you start to feel better. Use throat lozenges or sprays to soothe your throat as told by your doctor. General instructions  Talk as little as  possible. To do this: Avoid whispering. Write instead of talking. Do this until your voice is back to normal. Rinse your mouth (gargle) with a salt water mixture 3-4 times a day or as needed. To make salt water, dissolve -1 tsp (3-6 g) of salt in 1 cup (237 mL) of warm water. Do not swallow this mixture. Drink enough fluid to keep your pee (urine) pale yellow. Breathe in moist air. Use a humidifier if you live in a dry climate. Do not smoke or use any products that contain nicotine or tobacco. If you need help quitting, ask your doctor. Contact a doctor if: You have a fever. Your pain is worse. Your symptoms do not get better in 2 weeks. Get help right away if: You cough up blood. You have trouble swallowing. You have trouble breathing. Summary Laryngitis is inflammation of your vocal cords. This condition causes your voice to sound low and hoarse. Rest your voice by talking as little as possible. Also avoid whispering. Get help right away if you have trouble swallowing or breathing or if you cough up blood. This information is not intended to replace advice given to you by your health care provider. Make sure you discuss any questions you have with your health care provider. Document Revised: 07/11/2020 Document Reviewed: 07/11/2020 Elsevier Patient Education  2023 [employer]  

## 2022-02-23 ENCOUNTER — Other Ambulatory Visit: Payer: Self-pay | Admitting: Family Medicine

## 2022-02-23 LAB — URINE CYTOLOGY ANCILLARY ONLY
Chlamydia: NEGATIVE
Comment: NEGATIVE
Comment: NORMAL
Neisseria Gonorrhea: NEGATIVE

## 2022-02-28 ENCOUNTER — Other Ambulatory Visit: Payer: Self-pay | Admitting: Family Medicine

## 2022-02-28 DIAGNOSIS — J4530 Mild persistent asthma, uncomplicated: Secondary | ICD-10-CM

## 2022-03-06 NOTE — Progress Notes (Unsigned)
Virtual Visit via Video Note  I connected with Rebecca Moreno on 03/07/22 at  2:30 PM EDT by a video enabled telemedicine application and verified that I am speaking with the correct person using two identifiers.  Location: Patient: home Provider: office Persons participated in the visit- patient, provider    I discussed the limitations of evaluation and management by telemedicine and the availability of in person appointments. The patient expressed understanding and agreed to proceed.   I discussed the assessment and treatment plan with the patient. The patient was provided an opportunity to ask questions and all were answered. The patient agreed with the plan and demonstrated an understanding of the instructions.   The patient was advised to call back or seek an in-person evaluation if the symptoms worsen or if the condition fails to improve as anticipated.  I provided 20 minutes of non-face-to-face time during this encounter.   Norman Clay, MD    [facility] MD/PA/NP OP Progress Note  03/07/2022 3:05 PM Rebecca Moreno  MRN:  [id number]  Chief Complaint:  Chief Complaint  Patient presents with   Follow-up   Depression   HPI:  This is a follow-up appointment for depression and anxiety.  She states that her mother-in-law fell, and hit her better.  She has been doing better since then.  Although she has not been able to have her time, stating that she is with her mother-in-law 24/7, she agrees to try going outside when the nurse visits the house.  Her daughter is in [place] school.  Her daughter has been very busy, and she has not been able to see her.  When she was asked about the relationship with her husband, she states that "we get along, that's it."  She has not noticed any difference since lowering the dose of quetiapine.  She does not think she can lower the dose of clonazepam as her anxiety is high without triggers, although she agreed that this medication will be tapered down in the  future.  She sleeps well.  She denies feeling depressed.  She denies SI, HI, hallucinations.  She denies decreased need for sleep or euphonia.  She denies alcohol use or drug use.   Daily routine: takes care of her mother in law Employment: on disability after TMJ surgery in 2007. 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 Her parents had marital discordance and the patient screamed when she was a child as she did not want to hear them. She reports good relationship with both of her parents otherwise. She has one sister, who she has good relationship with  Visit Diagnosis:    ICD-10-CM   1. MDD (major depressive disorder), recurrent, in partial remission ([place])  F33.41     2. Anxiety state  F41.1       Past Psychiatric History: Please see initial evaluation for full details. I have reviewed the history. No updates at this time.     Past Medical History:  Past Medical History:  Diagnosis Date   Acute pyelonephritis    Asthma    Bipolar disorder ([place])    Colon polyp    Complication of anesthesia    "sometimes hard to put to sleep"   Depression    Bipolar   Gastric ulcer    around 2013.  stress related   GERD (gastroesophageal reflux disease)  History of kidney stones    Hyperlipidemia    Hypothyroid    Polyp, uterus corpus    Sepsis due to Escherichia coli (E. coli) ([place]) 09/04/2018   Sleep apnea    no cpap used   Stroke [facility])     Past Surgical History:  Procedure Laterality Date   ABLATION ON ENDOMETRIOSIS     COLONOSCOPY     Per patient, done around 2013 in [place], had polyp and overdue for follow up.   COLONOSCOPY WITH PROPOFOL N/A 05/12/2018   Procedure: COLONOSCOPY WITH PROPOFOL;  Surgeon: Daneil Dolin, MD;  Location: [facility] SU[facility];  Service: Endoscopy;  Laterality: N/A;  12:00pm   CYSTOSCOPY W/ URETERAL STENT PLACEMENT Left 09/01/2018    Procedure: CYSTOSCOPY WITH RETROGRADE PYELOGRAM/URETERAL STENT PLACEMENT;  Surgeon: Ceasar Mons, MD;  Location: [facility] ORS;  Service: Urology;  Laterality: Left;   CYSTOSCOPY/URETEROSCOPY/HOLMIUM LASER/STENT PLACEMENT Left 09/17/2018   Procedure: CYSTOSCOPY/URETEROSCOPY/HOLMIUM LASER/STENT PLACEMENT;  Surgeon: Ceasar Mons, MD;  Location: [facility] ORS;  Service: Urology;  Laterality: Left;  ONLY NEEDS 45 MIN   OVARIAN CYST SURGERY Left    POLYPECTOMY  05/12/2018   Procedure: POLYPECTOMY;  Surgeon: Daneil Dolin, MD;  Location: [facility] SU[facility];  Service: Endoscopy;;  polyp at splenic flexure   TEMPOROMANDIBULAR JOINT SURGERY     9 surgeries   TUBAL LIGATION     vaginal childbirth     1    Family Psychiatric History: Please see initial evaluation for full details. I have reviewed the history. No updates at this time.    Family History:  Family History  Problem Relation Age of Onset   Anxiety disorder Mother    Hypertension Mother    Heart failure Mother    Stroke Mother    Hyperlipidemia Father    Diabetes Father    Stroke Father    Hypertension Father    Colon polyps Father        older than 34   Diabetes Sister    Hypertension Sister    Ovarian cancer Sister    Asthma Daughter    Ovarian cancer Maternal Grandmother    Colon cancer Maternal Grandmother    Renal Disease Paternal Grandmother    Heart attack Paternal Grandfather    Bipolar disorder Other     Social History:  Social History   Socioeconomic History   Marital status: Married    Spouse name: Not on file   Number of children: 1   Years of education: Not on file   Highest education level: High school graduate  Occupational History   Occupation: disability  Tobacco Use   Smoking status: Every Day    Packs/day: 0.50    Years: 30.00    Total pack years: 15.00    Types: Cigarettes   Smokeless tobacco: Never  Vaping Use   Vaping Use: Never used  Substance and Sexual Activity   Alcohol use:  Never   Drug use: Not Currently   Sexual activity: Yes    Partners: Male    Birth control/protection: Surgical    Comment: tubal  Other Topics Concern   Not on file  Social History Narrative   Not on file   Social Determinants of Health   Financial Resource Strain: High Risk (07/10/2021)   Overall Financial Resource Strain (CARDIA)    Difficulty of Paying Living Expenses: Hard  Food Insecurity: Food Insecurity Present (07/10/2021)   Hunger Vital Sign    Worried About Estate manager/land agent of Food  in the Last Year: Often true    [place] in the Last Year: Often true  Transportation Needs: No Transportation Needs (07/10/2021)   PRAPARE - Hydrologist (Medical): No    Lack of Transportation (Non-Medical): No  Physical Activity: Insufficiently Active (07/10/2021)   Exercise Vital Sign    Days of Exercise per Week: 3 days    Minutes of Exercise per Session: 20 min  Stress: No Stress Concern Present (07/10/2021)   [place]    Feeling of Stress : Not at all  Social Connections: Moderately Isolated (07/10/2021)   Social Connection and Isolation Panel [NHANES]    Frequency of Communication with Friends and Family: More than three times a week    Frequency of Social Gatherings with Friends and Family: More than three times a week    Attends Religious Services: Never    Marine scientist or Organizations: No    Attends Archivist Meetings: Never    Marital Status: Married    Allergies:  Allergies  Allergen Reactions   Lithium Other (See Comments)    Psoriasis     Metabolic Disorder Labs: Lab Results  Component Value Date   HGBA1C 5.2 02/20/2021   No results found for: "PROLACTIN" Lab Results  Component Value Date   CHOL 212 (H) 12/18/2018   TRIG 171 (H) 12/18/2018   HDL 44 12/18/2018   CHOLHDL 4.8 (H) 12/18/2018   LDLCALC 134 (H) 12/18/2018   LDLCALC 168 (H) 01/15/2018    Lab Results  Component Value Date   TSH 1.100 09/04/2021   TSH 0.772 02/20/2021    Therapeutic Level Labs: No results found for: "LITHIUM" No results found for: "VALPROATE" No results found for: "CBMZ"  Current Medications: Current Outpatient Medications  Medication Sig Dispense Refill   QUEtiapine Fumarate 150 MG TABS Take 150 mg by mouth at bedtime. 90 tablet 0   albuterol (VENTOLIN HFA) 108 (90 Base) MCG/ACT inhaler Inhale 1 puff into the lungs every 6 (six) hours as needed for wheezing or shortness of breath. 6.7 g 1   atorvastatin (LIPITOR) 80 MG tablet TAKE ONE TABLET AT BEDTIME 30 tablet 0   CALCIUM CITRATE-VITAMIN D3 PO Take 1 tablet by mouth daily.     Cariprazine HCl (VRAYLAR) 6 MG CAPS Take 1 capsule (6 mg total) by mouth daily. 90 capsule 0   Cholecalciferol (VITAMIN D3) 5000 units TABS Take 5,000 Units by mouth daily.      [START ON 03/29/2022] clonazePAM (KLONOPIN) 1 MG tablet Take 1 tablet (1 mg total) by mouth 2 (two) times daily. 60 tablet 1   DEXILANT 30 MG capsule DR TAKE (1) CAPSULE DAILY 90 capsule 3   DHS SAL 3 % SHAM Apply topically every morning.     famotidine (PEPCID) 20 MG tablet TAKE ONE TABLET BY MOUTH EVERY DAY 90 tablet 0   FLOVENT HFA 220 MCG/ACT inhaler INHALE 2 PUFFS TWICE DAILY 12 g 0   fluticasone (FLONASE) 50 MCG/ACT nasal spray SPRAY 2 SPRAYS INTO EACH NOSTRIL EVERY DAY 48 mL 0   gabapentin (NEURONTIN) 300 MG capsule Take 1 capsule at supper and 1 capsule at bedtime.  Further fills per Harrie Foreman, Neurology 180 capsule 0   lamoTRIgine (LAMICTAL) 100 MG tablet Take 3 tablets (300 mg total) by mouth at bedtime. 270 tablet 1   levothyroxine (SYNTHROID) 50 MCG tablet TAKE 1 TABLET DAILY 90 tablet 1  loratadine (CLARITIN) 10 MG tablet TAKE 1 TABLET DAILY 30 tablet 11   magnesium gluconate (MAGONATE) 500 MG tablet Take 500 mg by mouth daily.     MODERNA COVID-19 BIVAL BOOSTER 50 MCG/0.5ML injection      Multiple Vitamins-Minerals (MULTIVITAMIN  WOMEN PO) Take 1 tablet by mouth daily.     tiZANidine (ZANAFLEX) 4 MG tablet Take 0.5-1 tablets (2-4 mg total) by mouth every 6 (six) hours as needed for muscle spasms. 30 tablet 0   venlafaxine XR (EFFEXOR-XR) 150 MG 24 hr capsule Take 2 capsules (300 mg total) by mouth daily. 180 capsule 1   No current facility-administered medications for this visit.     Musculoskeletal: Strength & Muscle Tone:  N/A Gait & Station:  N/A Patient leans: N/A  Psychiatric Specialty Exam: Review of Systems  Last menstrual period 12/13/2017.There is no height or weight on file to calculate BMI.  General Appearance: Fairly Groomed  Eye Contact:  Good  Speech:  Clear and Coherent  Volume:  Normal  Mood:   good  Affect:  Appropriate, Congruent, and calm  Thought Process:  Coherent  Orientation:  Full (Time, Place, and Person)  Thought Content: Logical   Suicidal Thoughts:  No  Homicidal Thoughts:  No  Memory:  Immediate;   Good  Judgement:  Good  Insight:  Good  Psychomotor Activity:  Normal  Concentration:  Concentration: Good and Attention Span: Good  Recall:  Good  Fund of Knowledge: Good  Language: Good  Akathisia:  No  Handed:  Right  AIMS (if indicated): not done  Assets:  Communication Skills Desire for Improvement  ADL's:  Intact  Cognition: WNL  Sleep:  Good   Screenings: GAD-7    Flowsheet Row Office Visit from 02/21/2022 in [place] Visit from 01/17/2022 in [place] Visit from 08/28/2021 in [place] Visit from 02/20/2021 in [place] Visit from 06/29/2020 in [place]  Total GAD-7 Score 0 0 0 6 [place] Office Visit from 08/28/2021 in [place] from 07/22/2018 in [place]  Total Score (max 30 points ) 29 30      PHQ2-9     Early Visit from 02/21/2022 in [place] Visit from 01/17/2022 in [place] Visit from 10/19/2021 in [place] Office Visit from 08/28/2021 in [place] Video Visit from 07/19/2021 in [place]  PHQ-2 Total Score 0 0 1 0 1  PHQ-9 Total Score 0 0 -- -- --      [place] Office Visit from 10/19/2021 in [place] Video Visit from 11/15/2020 in [place] Video Visit from 08/18/2020 in [place] No Risk No Risk No Risk        Assessment and Plan:  Rebecca Moreno is a 56 y.o. year old female with a history of  depression, hypothyroidism, obstructive sleep apnea (CPAP machine), stroke, Hyperlipidemia, Hypertension, stroke, who presents for follow up appointment for below.   1. MDD (major depressive disorder), recurrent, in partial remission ([place]) 2. Anxiety state # Bipolar I disorder by history Exam is notable for calmer affect, and she denies any change in her mood symptoms despite tapering down the dose of quetiapine. Psychosocial stressors includes  being a caregiver of her mother-in-law, and marital conflict.  Will continue to lower the dose of quetiapine to avoid polypharmacy.  Will continue venlafaxine, lamotrigine to target depression.  Will continue Vraylar to target depression and mood dysregulation.  Will continue clonazepam as needed for anxiety.  Discussed with the patient that this medication will be tapered off in the future to avoid risk of dependence, over sedation.  Although she verbalized agreement, she declined to do this at this time.  There has been no escalation of the dose /no concern of misuse of this medication . Noted that she has been on both quetiapine and Vraylar when she was first seen by this  provider under the diagnosis of bipolar I disorder. Both the patient and her husband reports history of paranoia and irritability, visual hallucination in the context of depression in the past, and it is unclear whether she had manic symptoms otherwise. Unable to obtain record from her previous psychiatrist despite the attempt.      Plan   Continue venlafaxine 300 mg daily Continue Lamotrigine 300 mg daily Continue Vraylar 6 mg daily Decrease quetiapine 150 mg at night She agrees to obtain EKG at the next visit to monitor QTc prolongation Continue clonazepam 1 mg twice a day as needed for anxiety  Next appointment: 1/4 at 3:30, in person. She will continue to see this Probation officer, although transfer to [facility] was attempted in the past.  Obtain lab - lipid      Past trials of medication: sertraline, fluoxetine, lexapro, Celexa, duloxetine, Wellbutrin  lithium (psoriasis), Depakote (did not work), olanzapine, Abilify (weight gain), latuda, Geodon    The patient demonstrates the following risk factors for suicide: Chronic risk factors for suicide include: psychiatric disorder of depression. Acute risk factors for suicide include: family or marital conflict and unemployment. Protective factors for this patient include: positive social support and hope for the future. Considering these factors, the overall suicide risk at this point appears to be low. Patient is appropriate for outpatient follow up.  This clinician has discussed the side effect associated with medication prescribed during this encounter. Please refer to notes in the previous encounters for more details.      Collaboration of Care: Collaboration of Care: Other reviewed notes in Epic  Patient/Guardian was advised Release of Information must be obtained prior to any record release in order to collaborate their care with an outside provider. Patient/Guardian was advised if they have not already done so to contact the registration  department to sign all necessary forms in order for [place] to release information regarding their care.   Consent: Patient/Guardian gives verbal consent for treatment and assignment of benefits for services provided during this visit. Patient/Guardian expressed understanding and agreed to proceed.    Norman Clay, MD 03/07/2022, 3:05 PM

## 2022-03-07 ENCOUNTER — Telehealth (INDEPENDENT_AMBULATORY_CARE_PROVIDER_SITE_OTHER): Payer: Medicare Other | Admitting: Psychiatry

## 2022-03-07 ENCOUNTER — Encounter: Payer: Self-pay | Admitting: Psychiatry

## 2022-03-07 DIAGNOSIS — F411 Generalized anxiety disorder: Secondary | ICD-10-CM | POA: Diagnosis not present

## 2022-03-07 DIAGNOSIS — F3341 Major depressive disorder, recurrent, in partial remission: Secondary | ICD-10-CM | POA: Diagnosis not present

## 2022-03-07 MED ORDER — CLONAZEPAM 1 MG PO TABS: 1.0000 mg | ORAL_TABLET | Freq: Two times a day (BID) | ORAL | 1 refills | Status: DC

## 2022-03-07 MED ORDER — QUETIAPINE FUMARATE 150 MG PO TABS: 150.0000 mg | ORAL_TABLET | Freq: Every day | ORAL | 0 refills | Status: DC

## 2022-03-09 ENCOUNTER — Other Ambulatory Visit: Payer: Self-pay | Admitting: Psychiatry

## 2022-03-09 ENCOUNTER — Telehealth: Payer: Self-pay

## 2022-03-09 MED ORDER — QUETIAPINE FUMARATE 100 MG PO TABS: 150.0000 mg | ORAL_TABLET | Freq: Every day | ORAL | 0 refills | Status: DC

## 2022-03-09 NOTE — Telephone Encounter (Signed)
Ordered 100 mg tab to take 150 mg at night. Please contact the pharmacy and cancel 150 mg tab order.

## 2022-03-09 NOTE — Telephone Encounter (Signed)
  Diane with [facility] called to stating that the pharmacy does not have this medication in '150mg'$   tablets they do have '100mg'$  and '50mg'$     Disp Refills Start End   QUEtiapine Fumarate 150 MG TABS 90 tablet 0 03/07/2022 06/05/2022   Sig - Route: Take 150 mg by mouth at bedtime. - Oral   Sent to pharmacy as: QUEtiapine Fumarate 150 MG Tab

## 2022-03-12 DIAGNOSIS — G4733 Obstructive sleep apnea (adult) (pediatric): Secondary | ICD-10-CM | POA: Diagnosis not present

## 2022-03-12 DIAGNOSIS — R0902 Hypoxemia: Secondary | ICD-10-CM | POA: Diagnosis not present

## 2022-03-12 DIAGNOSIS — J449 Chronic obstructive pulmonary disease, unspecified: Secondary | ICD-10-CM | POA: Diagnosis not present

## 2022-03-13 NOTE — Telephone Encounter (Signed)
Pharmacy notified.

## 2022-03-20 ENCOUNTER — Ambulatory Visit (INDEPENDENT_AMBULATORY_CARE_PROVIDER_SITE_OTHER): Payer: Medicare Other | Admitting: Family Medicine

## 2022-03-20 ENCOUNTER — Encounter: Payer: Self-pay | Admitting: Family Medicine

## 2022-03-20 VITALS — BP 128/79 | HR 79 | Temp 97.2°F | Ht 65.0 in | Wt 195.2 lb

## 2022-03-20 DIAGNOSIS — M5431 Sciatica, right side: Secondary | ICD-10-CM | POA: Diagnosis not present

## 2022-03-20 DIAGNOSIS — M47816 Spondylosis without myelopathy or radiculopathy, lumbar region: Secondary | ICD-10-CM

## 2022-03-20 DIAGNOSIS — Z720 Tobacco use: Secondary | ICD-10-CM | POA: Diagnosis not present

## 2022-03-20 DIAGNOSIS — M48061 Spinal stenosis, lumbar region without neurogenic claudication: Secondary | ICD-10-CM

## 2022-03-20 MED ORDER — VARENICLINE TARTRATE 1 MG PO TABS: 1.0000 mg | ORAL_TABLET | Freq: Two times a day (BID) | ORAL | 2 refills | Status: DC

## 2022-03-20 MED ORDER — VARENICLINE TARTRATE (STARTER) 0.5 MG X 11 & 1 MG X 42 PO TBPK: ORAL_TABLET | ORAL | 0 refills | Status: DC

## 2022-03-20 MED ORDER — TIZANIDINE HCL 4 MG PO TABS: 2.0000 mg | ORAL_TABLET | Freq: Three times a day (TID) | ORAL | 0 refills | Status: DC | PRN

## 2022-03-20 NOTE — Progress Notes (Signed)
Subjective: CC: Low back pain PCP: [name], DO HAL:PFXTK H Ritter is a 56 y.o. female presenting to clinic today for:  1.  Low back pain Patient reports that she has ongoing low back pain.  She feels like this is been pretty persistent since she fell and had sacral pain back in September.  She does and the pain does radiate down the right lower extremity and does cause some numbness and tingling.  She feels like the posterior low back/hip area feels like it wants to pop out of joint but does not.  Denies any saddle anesthesia, fecal incontinence or urinary retention.  She is not currently utilizing any muscle relaxer but she did find that to be helpful when she used back in September.   ROS: Per HPI  Allergies  Allergen Reactions   Lithium Other (See Comments)    Psoriasis    Past Medical History:  Diagnosis Date   Acute pyelonephritis    Asthma    Bipolar disorder ([place])    Colon polyp    Complication of anesthesia    "sometimes hard to put to sleep"   Depression    Bipolar   Gastric ulcer    around 2013.  stress related   GERD (gastroesophageal reflux disease)    History of kidney stones    Hyperlipidemia    Hypothyroid    Polyp, uterus corpus    Sepsis due to Escherichia coli (E. coli) ([place]) 09/04/2018   Sleep apnea    no cpap used   Stroke [facility])     Current Outpatient Medications:    albuterol (VENTOLIN HFA) 108 (90 Base) MCG/ACT inhaler, Inhale 1 puff into the lungs every 6 (six) hours as needed for wheezing or shortness of breath., Disp: 6.7 g, Rfl: 1   atorvastatin (LIPITOR) 80 MG tablet, TAKE ONE TABLET AT BEDTIME, Disp: 30 tablet, Rfl: 0   CALCIUM CITRATE-VITAMIN D3 PO, Take 1 tablet by mouth daily., Disp: , Rfl:    Cariprazine HCl (VRAYLAR) 6 MG CAPS, Take 1 capsule (6 mg total) by mouth daily., Disp: 90 capsule, Rfl: 0   Cholecalciferol (VITAMIN D3) 5000 units TABS, Take 5,000 Units by mouth daily. , Disp: , Rfl:    [START ON 03/29/2022]  clonazePAM (KLONOPIN) 1 MG tablet, Take 1 tablet (1 mg total) by mouth 2 (two) times daily., Disp: 60 tablet, Rfl: 1   DEXILANT 30 MG capsule DR, TAKE (1) CAPSULE DAILY, Disp: 90 capsule, Rfl: 3   DHS SAL 3 % SHAM, Apply topically every morning., Disp: , Rfl:    famotidine (PEPCID) 20 MG tablet, TAKE ONE TABLET BY MOUTH EVERY DAY, Disp: 90 tablet, Rfl: 0   FLOVENT HFA 220 MCG/ACT inhaler, INHALE 2 PUFFS TWICE DAILY, Disp: 12 g, Rfl: 0   fluticasone (FLONASE) 50 MCG/ACT nasal spray, SPRAY 2 SPRAYS INTO EACH NOSTRIL EVERY DAY, Disp: 48 mL, Rfl: 0   gabapentin (NEURONTIN) 300 MG capsule, Take 1 capsule at supper and 1 capsule at bedtime.  Further fills per Harrie Foreman, Neurology, Disp: 180 capsule, Rfl: 0   lamoTRIgine (LAMICTAL) 100 MG tablet, Take 3 tablets (300 mg total) by mouth at bedtime., Disp: 270 tablet, Rfl: 1   levothyroxine (SYNTHROID) 50 MCG tablet, TAKE 1 TABLET DAILY, Disp: 90 tablet, Rfl: 1   loratadine (CLARITIN) 10 MG tablet, TAKE 1 TABLET DAILY, Disp: 30 tablet, Rfl: 11   magnesium gluconate (MAGONATE) 500 MG tablet, Take 500 mg by mouth daily., Disp: , Rfl:  MODERNA COVID-19 BIVAL BOOSTER 50 MCG/0.5ML injection, , Disp: , Rfl:    Multiple Vitamins-Minerals (MULTIVITAMIN WOMEN PO), Take 1 tablet by mouth daily., Disp: , Rfl:    QUEtiapine (SEROQUEL) 100 MG tablet, Take 1.5 tablets (150 mg total) by mouth at bedtime., Disp: 135 tablet, Rfl: 0   QUEtiapine Fumarate 150 MG TABS, Take 150 mg by mouth at bedtime., Disp: 90 tablet, Rfl: 0   tiZANidine (ZANAFLEX) 4 MG tablet, Take 0.5-1 tablets (2-4 mg total) by mouth every 6 (six) hours as needed for muscle spasms., Disp: 30 tablet, Rfl: 0   venlafaxine XR (EFFEXOR-XR) 150 MG 24 hr capsule, Take 2 capsules (300 mg total) by mouth daily., Disp: 180 capsule, Rfl: 1 Social History   Socioeconomic History   Marital status: Married    Spouse name: Not on file   Number of children: 1   Years of education: Not on file   Highest  education level: High school graduate  Occupational History   Occupation: disability  Tobacco Use   Smoking status: Every Day    Packs/day: 0.50    Years: 30.00    Total pack years: 15.00    Types: Cigarettes   Smokeless tobacco: Never  Vaping Use   Vaping Use: Never used  Substance and Sexual Activity   Alcohol use: Never   Drug use: Not Currently   Sexual activity: Yes    Partners: Male    Birth control/protection: Surgical    Comment: tubal  Other Topics Concern   Not on file  Social History Narrative   Not on file   Social Determinants of Health   Financial Resource Strain: High Risk (07/10/2021)   Overall Financial Resource Strain (CARDIA)    Difficulty of Paying Living Expenses: Hard  Food Insecurity: Food Insecurity Present (07/10/2021)   Hunger Vital Sign    Worried About Running Out of Food in the Last Year: Often true    Ran Out of Food in the Last Year: Often true  Transportation Needs: No Transportation Needs (07/10/2021)   PRAPARE - Hydrologist (Medical): No    Lack of Transportation (Non-Medical): No  Physical Activity: Insufficiently Active (07/10/2021)   Exercise Vital Sign    Days of Exercise per Week: 3 days    Minutes of Exercise per Session: 20 min  Stress: No Stress Concern Present (07/10/2021)   [place]    Feeling of Stress : Not at all  Social Connections: Moderately Isolated (07/10/2021)   Social Connection and Isolation Panel [NHANES]    Frequency of Communication with Friends and Family: More than three times a week    Frequency of Social Gatherings with Friends and Family: More than three times a week    Attends Religious Services: Never    Marine scientist or Organizations: No    Attends Archivist Meetings: Never    Marital Status: Married  Human resources officer Violence: Not At Risk (07/10/2021)   Humiliation, Afraid, Rape, and Kick  questionnaire    Fear of Current or Ex-Partner: No    Emotionally Abused: No    Physically Abused: No    Sexually Abused: No   Family History  Problem Relation Age of Onset   Anxiety disorder Mother    Hypertension Mother    Heart failure Mother    Stroke Mother    Hyperlipidemia Father    Diabetes Father    Stroke Father  Hypertension Father    Colon polyps Father        older than 42   Diabetes Sister    Hypertension Sister    Ovarian cancer Sister    Asthma Daughter    Ovarian cancer Maternal Grandmother    Colon cancer Maternal Grandmother    Renal Disease Paternal Grandmother    Heart attack Paternal Grandfather    Bipolar disorder Other     Objective: Office vital signs reviewed. BP 128/79   Pulse 79   Temp (!) 97.2 F (36.2 C)   Ht '5\' 5"'$  (1.651 m)   Wt 195 lb 3.2 oz (88.5 kg)   LMP 12/13/2017 (Approximate)   SpO2 94%   BMI 32.48 kg/m   Physical Examination:  General: Awake, alert, nontoxic female, No acute distress MSK: Ambulating independently and able to rise from a seated position without use of upper extremities.  Gait is slightly antalgic.  She has some lumbosacral flattening.  No midline tenderness palpation  Assessment/ Plan: 56 y.o. female   Spondylosis of lumbar spine - Plan: tiZANidine (ZANAFLEX) 4 MG tablet, Ambulatory referral to [facility]  Foraminal stenosis of lumbar region - Plan: tiZANidine (ZANAFLEX) 4 MG tablet, Ambulatory referral to Home Health  Sciatica of right side - Plan: tiZANidine (ZANAFLEX) 4 MG tablet, Ambulatory referral to [facility] use - Plan: Varenicline Tartrate, Starter, (CHANTIX STARTING MONTH PAK) 0.5 MG X 11 & 1 MG X 42 TBPK, varenicline (CHANTIX CONTINUING MONTH PAK) 1 MG tablet  Has known degenerative changes in the lumbar spine that were evident on the November 2022 MRI.  Muscle relaxer added for prn use.  I did recommend that she consider evaluation with spinal specialist but she is reluctant to  see 1 just yet.  Agreeable to home health physical therapy and this order has been placed.  We will reassess again in about 8 weeks and if persistent symptoms and/or worsening symptoms, plan for repeat MRI and referral to spinal specialist  Chantix ordered.  She reports stability of mental health.  She has tolerated the Chantix without difficulty in the past.  No orders of the defined types were placed in this encounter.  No orders of the defined types were placed in this encounter.    [name], DO [place] [phone]

## 2022-03-23 ENCOUNTER — Other Ambulatory Visit: Payer: Self-pay | Admitting: Family Medicine

## 2022-03-31 ENCOUNTER — Other Ambulatory Visit: Payer: Self-pay | Admitting: Family Medicine

## 2022-03-31 DIAGNOSIS — J4530 Mild persistent asthma, uncomplicated: Secondary | ICD-10-CM

## 2022-04-11 DIAGNOSIS — J449 Chronic obstructive pulmonary disease, unspecified: Secondary | ICD-10-CM | POA: Diagnosis not present

## 2022-04-11 DIAGNOSIS — G4733 Obstructive sleep apnea (adult) (pediatric): Secondary | ICD-10-CM | POA: Diagnosis not present

## 2022-04-11 DIAGNOSIS — R0902 Hypoxemia: Secondary | ICD-10-CM | POA: Diagnosis not present

## 2022-04-12 ENCOUNTER — Other Ambulatory Visit: Payer: Self-pay

## 2022-04-12 ENCOUNTER — Encounter: Payer: Self-pay | Admitting: Allergy & Immunology

## 2022-04-12 ENCOUNTER — Ambulatory Visit (INDEPENDENT_AMBULATORY_CARE_PROVIDER_SITE_OTHER): Payer: Medicare Other | Admitting: Allergy & Immunology

## 2022-04-12 VITALS — BP 100/70 | HR 77 | Temp 98.6°F | Resp 16 | Ht 66.0 in | Wt 194.3 lb

## 2022-04-12 DIAGNOSIS — R0981 Nasal congestion: Secondary | ICD-10-CM

## 2022-04-12 DIAGNOSIS — K219 Gastro-esophageal reflux disease without esophagitis: Secondary | ICD-10-CM | POA: Diagnosis not present

## 2022-04-12 DIAGNOSIS — J4520 Mild intermittent asthma, uncomplicated: Secondary | ICD-10-CM | POA: Diagnosis not present

## 2022-04-12 DIAGNOSIS — R490 Dysphonia: Secondary | ICD-10-CM | POA: Diagnosis not present

## 2022-04-12 MED ORDER — OMEPRAZOLE 20 MG PO CPDR: 20.0000 mg | DELAYED_RELEASE_CAPSULE | Freq: Two times a day (BID) | ORAL | 5 refills | Status: DC

## 2022-04-12 MED ORDER — FLUTICASONE PROPIONATE 50 MCG/ACT NA SUSP: NASAL | 5 refills | Status: DC

## 2022-04-12 NOTE — Progress Notes (Signed)
NEW PATIENT  Date of Service/Encounter:  04/12/22  Consult requested by: [name], DO   Assessment:   Hoarseness   Gastroesophageal reflux disease  Nasal congestion  Mild intermittent asthma, uncomplicated  Major depressive disorder  Plan/Recommendations:   1. Hoarseness - Lung testing was in the 60% range and it did improve NOT improve with the albuterol treatment. - This points away from a diagnosis of asthma. - This makes sense since asthma is a SMALL AIRWAY disease (and what you are experiencing is in your throat ie a LARGE AIRWAY).  - I do not think that this is asthma. - We are not going to worry about starting any kind of inhaler.   2. Gastroesophageal reflux disease - We are going to start omeprazole '20mg'$  twice daily. - We are also starting famotidine '20mg'$  twice daily. - These two together will work to control any silent reflux which can lead to hoarseness. - Do this consistently for a month to see if there is improvement.  - We need to get you in to see an ENT so they can look down your throat to see what might be going on. - Referral placed.   3. Nasal congestion - Start Flonase one spray per nostril daily at night to help with any morning congestion. - We are going to get some labs to see if you have allergies, although I doubt that this is the case.  4. Return in about 4 weeks (around 05/10/2022).     This note in its entirety was forwarded to the Provider who requested this consultation.  Subjective:   Rebecca Moreno is a 56 y.o. female presenting today for evaluation of  Chief Complaint  Patient presents with   Laryngitis    Rebecca Moreno has a history of the following: Patient Active Problem List   Diagnosis Date Noted   Tobacco use 06/01/2020   Amnesia 12/14/2019   Fatigue 12/14/2019   Hypoxemia 12/10/2019   Intracranial atherosclerosis 11/11/2019   Late effect of cerebrovascular accident (CVA) 09/25/2019   Cerebrovascular  accident (CVA) due to occlusion of left carotid artery ([place]) 09/18/2019   Status post stroke 09/16/2019   Stage 3a chronic kidney disease ([place]) 08/19/2019   Spondylosis of lumbar spine 08/19/2019   Obesity (BMI 30-39.9) 08/19/2019   Erythrocytosis 01/05/2019   Hypoxia 09/03/2018   Atelectasis 09/03/2018   Asthma with status asthmaticus    Elevated blood pressure reading 05/19/2018   Mood disorder in conditions classified elsewhere 04/14/2018   History of colonic polyps 04/07/2018   Elevated transaminase level 04/07/2018   GERD (gastroesophageal reflux disease) 04/07/2018   Active bleeding 04/07/2018   Rectal bleeding 04/07/2018   Psoriasis 01/21/2018   Osteoporosis 01/21/2018   Polyp of colon 01/15/2018   Post-menopausal bleeding 01/15/2018   Acquired hypothyroidism 01/15/2018   History of gastric ulcer 01/15/2018   Gastroesophageal reflux disease with esophagitis 01/15/2018    History obtained from: chart review and patient.  [name] was referred by [name], DO.     Rebecca Moreno is a 56 y.o. female presenting for an evaluation of hoarseness .   Asthma/Respiratory Symptom History: She reports that she has been unable to talk. This has been going on for four months.  She went to see her PCP who referred here. She denies sore throat. She never had a sore throat. It has gotten worse occasionally. It gets worse when she talks a lot. She did see ENT years ago. This was  for an ear infection at the time. It looks like she has been referred to ENT, but she denies that they ever called her.   She is currently disabled. She looks after her mother in law. She is exposed to smoke through her husband. Her mother in law does not smoker. Her mother in law has dementia.   There are no pets and no known mold exposure. They have not moved at all.  There are no changes to the environment at all that she is aware of. She denies any water leaks or mold exposure.  Allergic Rhinitis  Symptom History: She does have some postnasal drip in the morning. She does not take anything for it all. She does not get antibiotics frequently at all, likely around once per year and this is for UTIs only.  She does not get sinus infections or pneumonias or other infections on a routine basis.   GERD Symptom History: She has not been given any reflux medication to see if this helps.  She is open to trying it.   Otherwise, there is no history of other atopic diseases, including drug allergies, stinging insect allergies, or contact dermatitis. There is no significant infectious history. Vaccinations are up to date.    Past Medical History: Patient Active Problem List   Diagnosis Date Noted   Tobacco use 06/01/2020   Amnesia 12/14/2019   Fatigue 12/14/2019   Hypoxemia 12/10/2019   Intracranial atherosclerosis 11/11/2019   Late effect of cerebrovascular accident (CVA) 09/25/2019   Cerebrovascular accident (CVA) due to occlusion of left carotid artery ([place]) 09/18/2019   Status post stroke 09/16/2019   Stage 3a chronic kidney disease ([place]) 08/19/2019   Spondylosis of lumbar spine 08/19/2019   Obesity (BMI 30-39.9) 08/19/2019   Erythrocytosis 01/05/2019   Hypoxia 09/03/2018   Atelectasis 09/03/2018   Asthma with status asthmaticus    Elevated blood pressure reading 05/19/2018   Mood disorder in conditions classified elsewhere 04/14/2018   History of colonic polyps 04/07/2018   Elevated transaminase level 04/07/2018   GERD (gastroesophageal reflux disease) 04/07/2018   Active bleeding 04/07/2018   Rectal bleeding 04/07/2018   Psoriasis 01/21/2018   Osteoporosis 01/21/2018   Polyp of colon 01/15/2018   Post-menopausal bleeding 01/15/2018   Acquired hypothyroidism 01/15/2018   History of gastric ulcer 01/15/2018   Gastroesophageal reflux disease with esophagitis 01/15/2018    Medication List:  Allergies as of 04/12/2022       Reactions   Lithium Other (See Comments)   Psoriasis          Medication List        Accurate as of April 12, 2022 11:59 PM. If you have any questions, ask your nurse or doctor.          albuterol 108 (90 Base) MCG/ACT inhaler Commonly known as: VENTOLIN HFA Inhale 1 puff into the lungs every 6 (six) hours as needed for wheezing or shortness of breath.   atorvastatin 80 MG tablet Commonly known as: LIPITOR TAKE ONE TABLET AT BEDTIME   CALCIUM CITRATE-VITAMIN D3 PO Take 1 tablet by mouth daily.   clonazePAM 1 MG tablet Commonly known as: KLONOPIN Take 1 tablet (1 mg total) by mouth 2 (two) times daily.   Dexilant 30 MG capsule DR Generic drug: Dexlansoprazole TAKE (1) CAPSULE DAILY   DHS Sal 3 % Sham Generic drug: Salicylic Acid Apply topically every morning.   famotidine 20 MG tablet Commonly known as: PEPCID TAKE ONE TABLET BY MOUTH EVERY DAY  Flovent HFA 220 MCG/ACT inhaler Generic drug: fluticasone INHALE 2 PUFFS TWICE DAILY   fluticasone 50 MCG/ACT nasal spray Commonly known as: FLONASE SPRAY 2 SPRAYS INTO EACH NOSTRIL EVERY DAY What changed: Another medication with the same name was added. Make sure you understand how and when to take each. Changed by: Valentina Shaggy, MD   fluticasone 50 MCG/ACT nasal spray Commonly known as: FLONASE One spray per nostril daily at night to help with any morning congestion What changed: You were already taking a medication with the same name, and this prescription was added. Make sure you understand how and when to take each. Changed by: Valentina Shaggy, MD   gabapentin 300 MG capsule Commonly known as: NEURONTIN Take 1 capsule at supper and 1 capsule at bedtime.  Further fills per Harrie Foreman, Neurology   lamoTRIgine 100 MG tablet Commonly known as: LAMICTAL Take 3 tablets (300 mg total) by mouth at bedtime.   levothyroxine 50 MCG tablet Commonly known as: SYNTHROID TAKE 1 TABLET DAILY   loratadine 10 MG tablet Commonly known as: CLARITIN TAKE 1  TABLET DAILY   magnesium gluconate 500 MG tablet Commonly known as: MAGONATE Take 500 mg by mouth daily.   Moderna COVID-19 Bival Booster 50 MCG/0.5ML injection Generic drug: COVID-19 mRNA bivalent vaccine (Moderna)   MULTIVITAMIN WOMEN PO Take 1 tablet by mouth daily.   omeprazole 20 MG capsule Commonly known as: PRILOSEC Take 1 capsule (20 mg total) by mouth 2 (two) times daily. Started by: Valentina Shaggy, MD   QUEtiapine Fumarate 150 MG Tabs Take 150 mg by mouth at bedtime.   QUEtiapine 100 MG tablet Commonly known as: SEROQUEL Take 1.5 tablets (150 mg total) by mouth at bedtime.   tiZANidine 4 MG tablet Commonly known as: Zanaflex Take 0.5-1 tablets (2-4 mg total) by mouth every 8 (eight) hours as needed for muscle spasms.   Varenicline Tartrate (Starter) 0.5 MG X 11 & 1 MG X 42 Tbpk Commonly known as: Chantix Starting Month Pak Take 0.5 mg tablet by mouth once daily x3 days, then 0.5 mg tablet twice daily x4 days, then increase to one 1 mg tablet twice daily.   varenicline 1 MG tablet Commonly known as: Chantix Continuing Month Pak Take 1 tablet (1 mg total) by mouth 2 (two) times daily.   venlafaxine XR 150 MG 24 hr capsule Commonly known as: EFFEXOR-XR Take 2 capsules (300 mg total) by mouth daily.   Vitamin D3 125 MCG (5000 UT) Tabs Take 5,000 Units by mouth daily.   Vraylar 6 MG Caps Generic drug: Cariprazine HCl Take 1 capsule (6 mg total) by mouth daily.        Birth History: non-contributory  Developmental History: non-contributory  Past Surgical History: Past Surgical History:  Procedure Laterality Date   ABLATION ON ENDOMETRIOSIS     COLONOSCOPY     Per patient, done around 2013 in [place], had polyp and overdue for follow up.   COLONOSCOPY WITH PROPOFOL N/A 05/12/2018   Procedure: COLONOSCOPY WITH PROPOFOL;  Surgeon: Daneil Dolin, MD;  Location: [facility];  Service: Endoscopy;  Laterality: N/A;  12:00pm   CYSTOSCOPY W/  URETERAL STENT PLACEMENT Left 09/01/2018   Procedure: CYSTOSCOPY WITH RETROGRADE PYELOGRAM/URETERAL STENT PLACEMENT;  Surgeon: Ceasar Mons, MD;  Location: [facility];  Service: Urology;  Laterality: Left;   CYSTOSCOPY/URETEROSCOPY/HOLMIUM LASER/STENT PLACEMENT Left 09/17/2018   Procedure: CYSTOSCOPY/URETEROSCOPY/HOLMIUM LASER/STENT PLACEMENT;  Surgeon: Ceasar Mons, MD;  Location: [facility];  Service: Urology;  Laterality: Left;  ONLY NEEDS 45 MIN   OVARIAN CYST SURGERY Left    POLYPECTOMY  05/12/2018   Procedure: POLYPECTOMY;  Surgeon: Daneil Dolin, MD;  Location: [facility];  Service: Endoscopy;;  polyp at splenic flexure   TEMPOROMANDIBULAR JOINT SURGERY     9 surgeries   TUBAL LIGATION     vaginal childbirth     1     Family History: Family History  Problem Relation Age of Onset   Anxiety disorder Mother    Hypertension Mother    Heart failure Mother    Stroke Mother    Hyperlipidemia Father    Diabetes Father    Stroke Father    Hypertension Father    Colon polyps Father        older than 23   Diabetes Sister    Hypertension Sister    Ovarian cancer Sister    Ovarian cancer Maternal Grandmother    Colon cancer Maternal Grandmother    Renal Disease Paternal Grandmother    Heart attack Paternal Grandfather    Asthma Daughter    Bipolar disorder Other    Asthma Niece      Social History: Rebecca Moreno lives at home with her family. She lives in a house that is 58 years old. There is carpeting throughout the home. There is gas heating and central cooling. There are no animals inside or outside of the home. There are no dust mite coverings on the bedding. There is tobacco exposure in the home. There is no fume, chemical, or dust exposure in the home. There is no HEPA filter in the home.     Review of Systems  Constitutional: Negative.  Negative for chills, fever, malaise/fatigue and weight loss.  HENT: Negative.  Negative for congestion, ear discharge and  ear pain.   Eyes:  Negative for pain, discharge and redness.  Respiratory:  Positive for cough. Negative for sputum production, shortness of breath and wheezing.        Positive for hoarseness.   Cardiovascular: Negative.  Negative for chest pain and palpitations.  Gastrointestinal:  Negative for abdominal pain, diarrhea, heartburn, nausea and vomiting.  Skin: Negative.  Negative for itching and rash.  Neurological:  Negative for dizziness and headaches.  Endo/Heme/Allergies:  Negative for environmental allergies. Does not bruise/bleed easily.       Objective:   Blood pressure 100/70, pulse 77, temperature 98.6 F (37 C), resp. rate 16, height '5\' 6"'$  (1.676 m), weight 194 lb 5 oz (88.1 kg), last menstrual period 12/13/2017, SpO2 97 %. Body mass index is 31.36 kg/m.     Physical Exam Vitals reviewed.  Constitutional:      Appearance: She is well-developed.     Comments: Soft spoken. Hoarse.   HENT:     Head: Normocephalic and atraumatic.     Right Ear: Tympanic membrane, ear canal and external ear normal. No drainage, swelling or tenderness. Tympanic membrane is not injected, scarred, erythematous, retracted or bulging.     Left Ear: Tympanic membrane, ear canal and external ear normal. No drainage, swelling or tenderness. Tympanic membrane is not injected, scarred, erythematous, retracted or bulging.     Nose: No nasal deformity, septal deviation, mucosal edema or rhinorrhea.     Right Turbinates: Not enlarged or swollen.     Left Turbinates: Not enlarged or swollen.     Right Sinus: No maxillary sinus tenderness or frontal sinus tenderness.     Left Sinus: No maxillary sinus tenderness or  frontal sinus tenderness.     Mouth/Throat:     Mouth: Mucous membranes are not pale and not dry.     Pharynx: Uvula midline.  Eyes:     General:        Right eye: No discharge.        Left eye: No discharge.     Conjunctiva/sclera: Conjunctivae normal.     Right eye: Right conjunctiva  is not injected. No chemosis.    Left eye: Left conjunctiva is not injected. No chemosis.    Pupils: Pupils are equal, round, and reactive to light.  Cardiovascular:     Rate and Rhythm: Normal rate and regular rhythm.     Heart sounds: Normal heart sounds.  Pulmonary:     Effort: Pulmonary effort is normal. No tachypnea, accessory muscle usage or respiratory distress.     Breath sounds: Normal breath sounds. No wheezing, rhonchi or rales.  Chest:     Chest wall: No tenderness.  Abdominal:     Tenderness: There is no abdominal tenderness. There is no guarding or rebound.  Lymphadenopathy:     Head:     Right side of head: No submandibular, tonsillar or occipital adenopathy.     Left side of head: No submandibular, tonsillar or occipital adenopathy.     Cervical: No cervical adenopathy.  Skin:    Coloration: Skin is not pale.     Findings: No abrasion, erythema, petechiae or rash. Rash is not papular, urticarial or vesicular.  Neurological:     Mental Status: She is alert.  Psychiatric:        Behavior: Behavior is cooperative.      Diagnostic studies:    Spirometry: results abnormal (FEV1: 1.72/63%, FVC: 2.31/66%, FEV1/FVC: 74%).    Spirometry consistent with possible restrictive disease. Albuterol four puffs via MDI treatment given in clinic with no improvement.  Allergy Studies: none          Salvatore Marvel, MD [facility]

## 2022-04-12 NOTE — Patient Instructions (Addendum)
1. Hoarseness - Lung testing was in the 60% range and it did improve NOT improve with the albuterol treatment. - This points away from a diagnosis of asthma. - This makes sense since asthma is a SMALL AIRWAY disease (and what you are experiencing is in your throat ie a LARGE AIRWAY).  - I do not think that this is asthma. - We are not going to worry about starting any kind of inhaler.   2. Gastroesophageal reflux disease - We are going to start omeprazole '20mg'$  twice daily. - We are also starting famotidine '20mg'$  twice daily. - These two together will work to control any silent reflux which can lead to hoarseness. - Do this consistently for a month to see if there is improvement.  - We need to get you in to see an ENT so they can look down your throat to see what might be going on. - Referral placed.   3. Nasal congestion - Start Flonase one spray per nostril daily at night to help with any morning congestion. - We are going to get some labs to see if you have allergies, although I doubt that this is the case.  4. Return in about 4 weeks (around 05/10/2022).    Please inform us of any Emergency Department visits, hospitalizations, or changes in symptoms. Call us before going to the ED for breathing or allergy symptoms since we might be able to fit you in for a sick visit. Feel free to contact us anytime with any questions, problems, or concerns.  It was a pleasure to meet you today!  Websites that have reliable patient information: 1. American Academy of Asthma, Allergy, and Immunology: www.aaaai.org 2. Food Allergy Research and Education (FARE): foodallergy.org 3. Mothers of Asthmatics: http://www.asthmacommunitynetwork.org 4. American College of Allergy, Asthma, and Immunology: www.acaai.org   COVID-19 Vaccine Information can be found at: [email or web address].[email or web address].uk For questions related to vaccine distribution or appointments, please  email [email or web address] or call [phone].   We realize that you might be concerned about having an allergic reaction to the COVID19 vaccines. To help with that concern, WE ARE OFFERING THE COVID19 VACCINES IN OUR OFFICE! Ask the front desk for dates!     "Like" [place] on Facebook and Instagram for our latest updates!      A healthy democracy works best when [employer] participate! Make sure you are registered to vote! If you have moved or changed any of your contact information, you will need to get this updated before voting!  In some cases, you MAY be able to register to vote online: [email or web address]

## 2022-04-15 ENCOUNTER — Encounter: Payer: Self-pay | Admitting: Allergy & Immunology

## 2022-04-16 LAB — ALLERGEN PROFILE, MOLD
Aureobasidi Pullulans IgE: <0.1 kU/L
Candida Albicans IgE: <0.1 kU/L
M009-IgE Fusarium proliferatum: <0.1 kU/L
M014-IgE Epicoccum purpur: <0.1 kU/L
Mucor Racemosus IgE: <0.1 kU/L
Phoma Betae IgE: <0.1 kU/L
Setomelanomma Rostrat: <0.1 kU/L
Stemphylium Herbarum IgE: <0.1 kU/L

## 2022-04-16 LAB — ALLERGENS W/COMP RFLX AREA 2
Alternaria Alternata IgE: <0.1 kU/L
Aspergillus Fumigatus IgE: <0.1 kU/L
Bermuda Grass IgE: <0.1 kU/L
Cedar, Mountain IgE: <0.1 kU/L
Cladosporium Herbarum IgE: <0.1 kU/L
Cockroach, German IgE: <0.1 kU/L
Common Silver Birch IgE: <0.1 kU/L
Cottonwood IgE: <0.1 kU/L
D Farinae IgE: <0.1 kU/L
D Pteronyssinus IgE: <0.1 kU/L
E001-IgE Cat Dander: <0.1 kU/L
E005-IgE Dog Dander: <0.1 kU/L
Elm, American IgE: <0.1 kU/L
IgE (Immunoglobulin E), Serum: 7 IU/mL (ref 6–495)
Johnson Grass IgE: <0.1 kU/L
Maple/Box Elder IgE: <0.1 kU/L
Mouse Urine IgE: <0.1 kU/L
Oak, White IgE: <0.1 kU/L
Pecan, Hickory IgE: <0.1 kU/L
Penicillium Chrysogen IgE: <0.1 kU/L
Pigweed, Rough IgE: <0.1 kU/L
Ragweed, Short IgE: <0.1 kU/L
Sheep Sorrel IgE Qn: <0.1 kU/L
Timothy Grass IgE: <0.1 kU/L
White Mulberry IgE: <0.1 kU/L

## 2022-04-16 LAB — RHEUMATOID FACTOR: Rheumatoid fact SerPl-aCnc: 22.3 IU/mL — ABNORMAL HIGH (ref <14.0)

## 2022-04-16 LAB — ANTINUCLEAR ANTIBODIES, IFA: ANA Titer 1: NEGATIVE

## 2022-04-16 LAB — C-REACTIVE PROTEIN: CRP: 12 mg/L — ABNORMAL HIGH (ref 0–10)

## 2022-04-16 LAB — SEDIMENTATION RATE: Sed Rate: 18 mm/hr (ref 0–40)

## 2022-04-27 ENCOUNTER — Other Ambulatory Visit: Payer: Self-pay | Admitting: Psychiatry

## 2022-05-04 ENCOUNTER — Other Ambulatory Visit: Payer: Self-pay | Admitting: Family Medicine

## 2022-05-04 DIAGNOSIS — J4530 Mild persistent asthma, uncomplicated: Secondary | ICD-10-CM

## 2022-05-08 NOTE — Progress Notes (Deleted)
BH MD/PA/NP OP Progress Note  05/08/2022 12:43 PM Rebecca Moreno  MRN:  [id number]  Chief Complaint: No chief complaint on file.  HPI: ***   Chantix was started by her PCP   Visit Diagnosis: No diagnosis found.  Past Psychiatric History: Please see initial evaluation for full details. I have reviewed the history. No updates at this time.     Past Medical History:  Past Medical History:  Diagnosis Date   Acute pyelonephritis    Asthma    Bipolar disorder ([place])    Colon polyp    Complication of anesthesia    "sometimes hard to put to sleep"   Depression    Bipolar   Gastric ulcer    around 2013.  stress related   GERD (gastroesophageal reflux disease)    History of kidney stones    Hyperlipidemia    Hypothyroid    Polyp, uterus corpus    Sepsis due to Escherichia coli (E. coli) ([place]) 09/04/2018   Sleep apnea    no cpap used   Stroke [facility])     Past Surgical History:  Procedure Laterality Date   ABLATION ON ENDOMETRIOSIS     COLONOSCOPY     Per patient, done around 2013 in [place], had polyp and overdue for follow up.   COLONOSCOPY WITH PROPOFOL N/A 05/12/2018   Procedure: COLONOSCOPY WITH PROPOFOL;  Surgeon: Daneil Dolin, MD;  Location: [facility] SU[facility];  Service: Endoscopy;  Laterality: N/A;  12:00pm   CYSTOSCOPY W/ URETERAL STENT PLACEMENT Left 09/01/2018   Procedure: CYSTOSCOPY WITH RETROGRADE PYELOGRAM/URETERAL STENT PLACEMENT;  Surgeon: Ceasar Mons, MD;  Location: [facility] ORS;  Service: Urology;  Laterality: Left;   CYSTOSCOPY/URETEROSCOPY/HOLMIUM LASER/STENT PLACEMENT Left 09/17/2018   Procedure: CYSTOSCOPY/URETEROSCOPY/HOLMIUM LASER/STENT PLACEMENT;  Surgeon: Ceasar Mons, MD;  Location: [facility];  Service: Urology;  Laterality: Left;  ONLY NEEDS 45 MIN   OVARIAN CYST SURGERY Left    POLYPECTOMY  05/12/2018   Procedure: POLYPECTOMY;  Surgeon: Daneil Dolin, MD;  Location: [facility];  Service: Endoscopy;;  polyp at splenic flexure    TEMPOROMANDIBULAR JOINT SURGERY     9 surgeries   TUBAL LIGATION     vaginal childbirth     1    Family Psychiatric History: Please see initial evaluation for full details. I have reviewed the history. No updates at this time.     Family History:  Family History  Problem Relation Age of Onset   Anxiety disorder Mother    Hypertension Mother    Heart failure Mother    Stroke Mother    Hyperlipidemia Father    Diabetes Father    Stroke Father    Hypertension Father    Colon polyps Father        older than 46   Diabetes Sister    Hypertension Sister    Ovarian cancer Sister    Ovarian cancer Maternal Grandmother    Colon cancer Maternal Grandmother    Renal Disease Paternal Grandmother    Heart attack Paternal Grandfather    Asthma Daughter    Bipolar disorder Other    Asthma Niece     Social History:  Social History   Socioeconomic History   Marital status: Married    Spouse name: Not on file   Number of children: 1   Years of education: Not on file   Highest education level: High school graduate  Occupational History   Occupation: disability  Tobacco Use   Smoking status:  Every Day    Packs/day: 0.50    Years: 30.00    Total pack years: 15.00    Types: Cigarettes   Smokeless tobacco: Never   Tobacco comments:    Is smoking about 0.5 of a pack and has a patch placed on   Vaping Use   Vaping Use: Never used  Substance and Sexual Activity   Alcohol use: Never   Drug use: Not Currently   Sexual activity: Yes    Partners: Male    Birth control/protection: Surgical    Comment: tubal  Other Topics Concern   Not on file  Social History Narrative   Not on file   Social Determinants of Health   Financial Resource Strain: High Risk (07/10/2021)   Overall Financial Resource Strain (CARDIA)    Difficulty of Paying Living Expenses: Hard  Food Insecurity: Food Insecurity Present (07/10/2021)   Hunger Vital Sign    Worried About Running Out of Food in the Last  Year: Often true    Ran Out of Food in the Last Year: Often true  Transportation Needs: No Transportation Needs (07/10/2021)   PRAPARE - Hydrologist (Medical): No    Lack of Transportation (Non-Medical): No  Physical Activity: Insufficiently Active (07/10/2021)   Exercise Vital Sign    Days of Exercise per Week: 3 days    Minutes of Exercise per Session: 20 min  Stress: No Stress Concern Present (07/10/2021)   [place]    Feeling of Stress : Not at all  Social Connections: Moderately Isolated (07/10/2021)   Social Connection and Isolation Panel [NHANES]    Frequency of Communication with Friends and Family: More than three times a week    Frequency of Social Gatherings with Friends and Family: More than three times a week    Attends Religious Services: Never    Marine scientist or Organizations: No    Attends Archivist Meetings: Never    Marital Status: Married    Allergies:  Allergies  Allergen Reactions   Lithium Other (See Comments)    Psoriasis     Metabolic Disorder Labs: Lab Results  Component Value Date   HGBA1C 5.2 02/20/2021   No results found for: "PROLACTIN" Lab Results  Component Value Date   CHOL 212 (H) 12/18/2018   TRIG 171 (H) 12/18/2018   HDL 44 12/18/2018   CHOLHDL 4.8 (H) 12/18/2018   LDLCALC 134 (H) 12/18/2018   LDLCALC 168 (H) 01/15/2018   Lab Results  Component Value Date   TSH 1.100 09/04/2021   TSH 0.772 02/20/2021    Therapeutic Level Labs: No results found for: "LITHIUM" No results found for: "VALPROATE" No results found for: "CBMZ"  Current Medications: Current Outpatient Medications  Medication Sig Dispense Refill   albuterol (VENTOLIN HFA) 108 (90 Base) MCG/ACT inhaler Inhale 1 puff into the lungs every 6 (six) hours as needed for wheezing or shortness of breath. 6.7 g 1   atorvastatin (LIPITOR) 80 MG tablet TAKE ONE TABLET  AT BEDTIME 30 tablet 2   CALCIUM CITRATE-VITAMIN D3 PO Take 1 tablet by mouth daily.     Cholecalciferol (VITAMIN D3) 5000 units TABS Take 5,000 Units by mouth daily.      clonazePAM (KLONOPIN) 1 MG tablet Take 1 tablet (1 mg total) by mouth 2 (two) times daily. 60 tablet 1   DEXILANT 30 MG capsule DR TAKE (1) CAPSULE DAILY 90 capsule 3  famotidine (PEPCID) 20 MG tablet TAKE ONE TABLET BY MOUTH EVERY DAY 90 tablet 0   FLOVENT HFA 220 MCG/ACT inhaler INHALE 2 PUFFS TWICE DAILY 12 g 0   fluticasone (FLONASE) 50 MCG/ACT nasal spray SPRAY 2 SPRAYS INTO EACH NOSTRIL EVERY DAY 48 mL 0   gabapentin (NEURONTIN) 300 MG capsule Take 1 capsule at supper and 1 capsule at bedtime.  Further fills per Harrie Foreman, Neurology 180 capsule 0   lamoTRIgine (LAMICTAL) 100 MG tablet Take 3 tablets (300 mg total) by mouth at bedtime. 270 tablet 1   levothyroxine (SYNTHROID) 50 MCG tablet TAKE 1 TABLET DAILY 90 tablet 1   loratadine (CLARITIN) 10 MG tablet TAKE 1 TABLET DAILY 30 tablet 11   magnesium gluconate (MAGONATE) 500 MG tablet Take 500 mg by mouth daily.     MODERNA COVID-19 BIVAL BOOSTER 50 MCG/0.5ML injection      Multiple Vitamins-Minerals (MULTIVITAMIN WOMEN PO) Take 1 tablet by mouth daily.     omeprazole (PRILOSEC) 20 MG capsule Take 1 capsule (20 mg total) by mouth 2 (two) times daily. 60 capsule 5   QUEtiapine (SEROQUEL) 100 MG tablet Take 1.5 tablets (150 mg total) by mouth at bedtime. 135 tablet 0   QUEtiapine Fumarate 150 MG TABS Take 150 mg by mouth at bedtime. 90 tablet 0   tiZANidine (ZANAFLEX) 4 MG tablet Take 0.5-1 tablets (2-4 mg total) by mouth every 8 (eight) hours as needed for muscle spasms. 60 tablet 0   venlafaxine XR (EFFEXOR-XR) 150 MG 24 hr capsule Take 2 capsules (300 mg total) by mouth daily. 180 capsule 1   No current facility-administered medications for this visit.     Musculoskeletal: Strength & Muscle Tone: within normal limits Gait & Station: normal Patient leans:  N/A  Psychiatric Specialty Exam: Review of Systems  Last menstrual period 12/13/2017.There is no height or weight on file to calculate BMI.  General Appearance: {Appearance:22683}  Eye Contact:  {BHH EYE CONTACT:22684}  Speech:  Clear and Coherent  Volume:  Normal  Mood:  {BHH MOOD:22306}  Affect:  {Affect (PAA):22687}  Thought Process:  Coherent  Orientation:  Full (Time, Place, and Person)  Thought Content: Logical   Suicidal Thoughts:  {ST/HT (PAA):22692}  Homicidal Thoughts:  {ST/HT (PAA):22692}  Memory:  Immediate;   Good  Judgement:  {Judgement (PAA):22694}  Insight:  {Insight (PAA):22695}  Psychomotor Activity:  Normal  Concentration:  Concentration: Good and Attention Span: Good  Recall:  Good  Fund of Knowledge: Good  Language: Good  Akathisia:  No  Handed:  Right  AIMS (if indicated): not done  Assets:  Communication Skills Desire for Improvement  ADL's:  Intact  Cognition: WNL  Sleep:  Good   Screenings: GAD-7    Flowsheet Row Office Visit from 03/20/2022 in [place] Office Visit from 02/21/2022 in [place] Office Visit from 01/17/2022 in [place] Office Visit from 08/28/2021 in [place] Office Visit from 02/20/2021 in [place]  Total GAD-7 Score 13 0 0 0 6      Mini-Mental    Flowsheet Row Office Visit from 08/28/2021 in [place] from 07/22/2018 in [place]  Total Score (max 30 points ) 29 30      PHQ2-9    [place] Visit from 03/20/2022 in [place] Office Visit from 02/21/2022 in [place] Office Visit from 01/17/2022 in [place]  Family Medicine Office Visit from 10/19/2021 in [place] Office Visit from 08/28/2021 in [place]  PHQ-2  Total Score 1 0 0 1 0  PHQ-9 Total Score 6 0 0 -- --      Flowsheet Row Office Visit from 10/19/2021 in [place] Video Visit from 11/15/2020 in [place] Video Visit from 08/18/2020 in [place] No Risk No Risk No Risk        Assessment and Plan:  UNNAMED HINO is a 57 y.o. year old female with a history of depression, hypothyroidism, obstructive sleep apnea (CPAP machine), stroke, Hyperlipidemia, Hypertension, stroke, who presents for follow up appointment for below.     1. MDD (major depressive disorder), recurrent, in partial remission ([place]) 2. Anxiety state # Bipolar I disorder by history Exam is notable for calmer affect, and she denies any change in her mood symptoms despite tapering down the dose of quetiapine. Psychosocial stressors includes being a caregiver of her mother-in-law, and marital conflict.  Will continue to lower the dose of quetiapine to avoid polypharmacy.  Will continue venlafaxine, lamotrigine to target depression.  Will continue Vraylar to target depression and mood dysregulation.  Will continue clonazepam as needed for anxiety.  Discussed with the patient that this medication will be tapered off in the future to avoid risk of dependence, over sedation.  Although she verbalized agreement, she declined to do this at this time.  There has been no escalation of the dose /no concern of misuse of this medication . Noted that she has been on both quetiapine and Vraylar when she was first seen by this provider under the diagnosis of bipolar I disorder. Both the patient and her husband reports history of paranoia and irritability, visual hallucination in the context of depression in the past, and it is unclear whether she had manic symptoms otherwise. Unable to obtain record from her previous psychiatrist despite the attempt.      Plan   Continue venlafaxine 300  mg daily Continue Lamotrigine 300 mg daily Continue Vraylar 6 mg daily Decrease quetiapine 150 mg at night She agrees to obtain EKG at the next visit to monitor QTc prolongation Continue clonazepam 1 mg twice a day as needed for anxiety  Next appointment: 1/4 at 3:30, in person. She will continue to see this Probation officer, although transfer to [facility] was attempted in the past.  Obtain lab - lipid      Past trials of medication: sertraline, fluoxetine, lexapro, Celexa, duloxetine, Wellbutrin  lithium (psoriasis), Depakote (did not work), olanzapine, Abilify (weight gain), latuda, Geodon    The patient demonstrates the following risk factors for suicide: Chronic risk factors for suicide include: psychiatric disorder of depression. Acute risk factors for suicide include: family or marital conflict and unemployment. Protective factors for this patient include: positive social support and hope for the future. Considering these factors, the overall suicide risk at this point appears to be low. Patient is appropriate for outpatient follow up.   This clinician has discussed the side effect associated with medication prescribed during this encounter. Please refer to notes in the previous encounters for more details.        Collaboration of Care: Collaboration of Care: {BH OP Collaboration of Care:21014065}  Patient/Guardian was advised Release of Information must be obtained prior to any record release in order to collaborate their care with an outside provider. Patient/Guardian was advised if they have not already  done so to contact the registration department to sign all necessary forms in order for [place] to release information regarding their care.   Consent: Patient/Guardian gives verbal consent for treatment and assignment of benefits for services provided during this visit. Patient/Guardian expressed understanding and agreed to proceed.    Norman Clay, MD 05/08/2022, 12:43 PM

## 2022-05-09 ENCOUNTER — Telehealth: Payer: Self-pay

## 2022-05-09 NOTE — Telephone Encounter (Signed)
received fax that the vraylar was approved until 05-07-23 ref # [id number]

## 2022-05-10 ENCOUNTER — Ambulatory Visit (INDEPENDENT_AMBULATORY_CARE_PROVIDER_SITE_OTHER): Payer: Medicare Other | Admitting: Allergy & Immunology

## 2022-05-10 ENCOUNTER — Encounter: Payer: Self-pay | Admitting: Allergy & Immunology

## 2022-05-10 ENCOUNTER — Other Ambulatory Visit: Payer: Self-pay

## 2022-05-10 ENCOUNTER — Ambulatory Visit: Payer: Medicare Other | Admitting: Psychiatry

## 2022-05-10 VITALS — BP 128/80 | HR 79 | Temp 97.8°F | Resp 12 | Wt 196.6 lb

## 2022-05-10 DIAGNOSIS — R490 Dysphonia: Secondary | ICD-10-CM | POA: Diagnosis not present

## 2022-05-10 DIAGNOSIS — J4520 Mild intermittent asthma, uncomplicated: Secondary | ICD-10-CM

## 2022-05-10 DIAGNOSIS — R899 Unspecified abnormal finding in specimens from other organs, systems and tissues: Secondary | ICD-10-CM

## 2022-05-10 NOTE — Progress Notes (Signed)
FOLLOW UP  Date of Service/Encounter:  05/10/22   Assessment:   Hoarseness - with no improvement on Flonase as well as PPI plus H2 blockers for 1 month    Mild intermittent asthma, uncomplicated - with no improvement with albuterol in the past  Elevated CRP and rheumatoid factor - rechecking levels today   Major depressive disorder  Plan/Recommendations:   1. Hoarseness - We are referring you to ENT so they can check out your throat. - Our referral coordinator - Karena Addison - is looking into this. - Call the office for [facility]NT tomorrow to make an appointment: [phone] - Stop the the heart burn medications (omeprazole and famotidine) since they did not work. - Stop the Flonase since this did not do anything either.  - Environmental allergy testing was completely negative.   2. Elevated CRP and Rheumatoid Factor. - We are getting repeat labs to see where these are trending.  - We could consider doing a Rheumatology referral if these are still abnormal.  - We will call you instead with the results since you coould not see the MyChart message.    3. Follow up as needed.   Subjective:   Rebecca Moreno is a 57 y.o. female presenting today for follow up of  Chief Complaint  Patient presents with   Follow-up    Voice is hoarse.    Rebecca Moreno has a history of the following: Patient Active Problem List   Diagnosis Date Noted   Tobacco use 06/01/2020   Amnesia 12/14/2019   Fatigue 12/14/2019   Hypoxemia 12/10/2019   Intracranial atherosclerosis 11/11/2019   Late effect of cerebrovascular accident (CVA) 09/25/2019   Cerebrovascular accident (CVA) due to occlusion of left carotid artery ([place]) 09/18/2019   Status post stroke 09/16/2019   Stage 3a chronic kidney disease ([place]) 08/19/2019   Spondylosis of lumbar spine 08/19/2019   Obesity (BMI 30-39.9) 08/19/2019   Erythrocytosis 01/05/2019   Hypoxia 09/03/2018   Atelectasis 09/03/2018   Asthma with status  asthmaticus    Elevated blood pressure reading 05/19/2018   Mood disorder in conditions classified elsewhere 04/14/2018   History of colonic polyps 04/07/2018   Elevated transaminase level 04/07/2018   GERD (gastroesophageal reflux disease) 04/07/2018   Active bleeding 04/07/2018   Rectal bleeding 04/07/2018   Psoriasis 01/21/2018   Osteoporosis 01/21/2018   Polyp of colon 01/15/2018   Post-menopausal bleeding 01/15/2018   Acquired hypothyroidism 01/15/2018   History of gastric ulcer 01/15/2018   Gastroesophageal reflux disease with esophagitis 01/15/2018    History obtained from: chart review and patient.  Rebecca Moreno is a 57 y.o. female presenting for a follow up visit.  She was last seen in December 2023 as a new patient.  At that time, her lung testing was in the 60% range and did not improve with the albuterol.  We did not feel this was related to asthma at all.  For her reflux, we started omeprazole and famotidine thinking this might be related to her hoarseness.  We did talk about going to see ENT and we referred her to ENT.  For her congestion, we started Flonase and we obtained labs to check for allergies. Labs were notable for an elevated rheumatoid factor and CRP.  Her mother had RA. She has joint pain on her right side. She has been having symptoms for months but it is getting better.  This was the reason we sent the ANA and inflammatory markers first.  I think it was  also worried about possible Sjogren's syndrome.  Since last visit, she has had no change in her symptoms.  She did take the omeprazole and the famotidine as prescribed for 1 month or more without any improvement.  She also has been using the Flonase without any improvement.  Hoarseness has not changed at all and has been present for now 6 months.   She has seen ENT in the distant past, but this was for an unrelated issue.  She never did get a call from ENT when we referred her in December.  She is very interested in  getting a referral to ENT.  She has not had a fever at all during the last 4 to 6 weeks.  The hoarseness has not changed at all with the seasons.  She has now had it for 6 months.  It is just continued to get worse overall.  She has used lozenges without any improvement in her symptoms.  She has never seen speech therapy.  Otherwise, there have been no changes to her past medical history, surgical history, family history, or social history.    Review of Systems  Constitutional: Negative.  Negative for chills, fever, malaise/fatigue and weight loss.  HENT: Negative.  Negative for congestion, ear discharge and ear pain.   Eyes:  Negative for pain, discharge and redness.  Respiratory:  Positive for cough. Negative for sputum production, shortness of breath and wheezing.        Positive for hoarseness.   Cardiovascular: Negative.  Negative for chest pain and palpitations.  Gastrointestinal:  Negative for abdominal pain, diarrhea, heartburn, nausea and vomiting.  Skin: Negative.  Negative for itching and rash.  Neurological:  Negative for dizziness and headaches.  Endo/Heme/Allergies:  Negative for environmental allergies. Does not bruise/bleed easily.       Objective:   Blood pressure 128/80, pulse 79, temperature 97.8 F (36.6 C), temperature source Temporal, resp. rate 12, weight 196 lb 9.6 oz (89.2 kg), last menstrual period 12/13/2017, SpO2 96 %. Body mass index is 31.73 kg/m.    Physical Exam Vitals reviewed.  Constitutional:      Appearance: She is well-developed.     Comments: Soft spoken. Hoarse.   HENT:     Head: Normocephalic and atraumatic.     Right Ear: Tympanic membrane, ear canal and external ear normal. No drainage, swelling or tenderness. Tympanic membrane is not injected, scarred, erythematous, retracted or bulging.     Left Ear: Tympanic membrane, ear canal and external ear normal. No drainage, swelling or tenderness. Tympanic membrane is not injected, scarred,  erythematous, retracted or bulging.     Nose: No nasal deformity, septal deviation, mucosal edema or rhinorrhea.     Right Turbinates: Enlarged. Not swollen or pale.     Left Turbinates: Enlarged. Not swollen or pale.     Right Sinus: No maxillary sinus tenderness or frontal sinus tenderness.     Left Sinus: No maxillary sinus tenderness or frontal sinus tenderness.     Mouth/Throat:     Mouth: Mucous membranes are not pale and not dry.     Pharynx: Uvula midline.  Eyes:     General:        Right eye: No discharge.        Left eye: No discharge.     Conjunctiva/sclera: Conjunctivae normal.     Right eye: Right conjunctiva is not injected. No chemosis.    Left eye: Left conjunctiva is not injected. No chemosis.    Pupils:  Pupils are equal, round, and reactive to light.  Cardiovascular:     Rate and Rhythm: Normal rate and regular rhythm.     Heart sounds: Normal heart sounds.  Pulmonary:     Effort: Pulmonary effort is normal. No tachypnea, accessory muscle usage or respiratory distress.     Breath sounds: Normal breath sounds. No wheezing, rhonchi or rales.  Chest:     Chest wall: No tenderness.  Abdominal:     Tenderness: There is no abdominal tenderness. There is no guarding or rebound.  Lymphadenopathy:     Head:     Right side of head: No submandibular, tonsillar or occipital adenopathy.     Left side of head: No submandibular, tonsillar or occipital adenopathy.     Cervical: No cervical adenopathy.  Skin:    Coloration: Skin is not pale.     Findings: No abrasion, erythema, petechiae or rash. Rash is not papular, urticarial or vesicular.  Neurological:     Mental Status: She is alert.  Psychiatric:        Behavior: Behavior is cooperative.      Diagnostic studies: none    Salvatore Marvel, MD  Allergy and [place] [facility] [place]

## 2022-05-10 NOTE — Patient Instructions (Addendum)
1. Hoarseness - We are referring you to ENT so they can check out your throat. - Our referral coordinator - Karena Addison - is looking into this. - Call the office for [facility] ENT tomorrow to make an appointment: [phone] - Stop the the heart burn medications (omeprazole and famotidine) since they did not work. - Stop the Flonase since this did not do anything either.  - Environmental allergy testing was completely negative.   2. Elevated CRP and Rheumatoid Factor. - We are getting repeat labs to see where these are trending.  - We could consider doing a Rheumatology referral if these are still abnormal.  - We will call you instead with the results since you coould not see the MyChart message.    3. Follow up as needed.    Please inform us of any Emergency Department visits, hospitalizations, or changes in symptoms. Call us before going to the ED for breathing or allergy symptoms since we might be able to fit you in for a sick visit. Feel free to contact us anytime with any questions, problems, or concerns.  It was a pleasure to see you again today! I am so sorry that we could not help you!   Websites that have reliable patient information: 1. American Academy of Asthma, Allergy, and Immunology: www.aaaai.org 2. Food Allergy Research and Education (FARE): foodallergy.org 3. Mothers of Asthmatics: http://www.asthmacommunitynetwork.org 4. American College of Allergy, Asthma, and Immunology: www.acaai.org   COVID-19 Vaccine Information can be found at: ShippingScam.co.uk For questions related to vaccine distribution or appointments, please email [email or web address] or call [phone].   We realize that you might be concerned about having an allergic reaction to the COVID19 vaccines. To help with that concern, WE ARE OFFERING THE COVID19 VACCINES IN OUR OFFICE! Ask the front desk for dates!     "Like" [place] on Facebook and  Instagram for our latest updates!      A healthy democracy works best when [employer] participate! Make sure you are registered to vote! If you have moved or changed any of your contact information, you will need to get this updated before voting!  In some cases, you MAY be able to register to vote online: [email or web address]

## 2022-05-11 LAB — RHEUMATOID FACTOR: Rheumatoid fact SerPl-aCnc: 20.4 IU/mL — ABNORMAL HIGH (ref <14.0)

## 2022-05-11 LAB — C-REACTIVE PROTEIN: CRP: 6 mg/L (ref 0–10)

## 2022-05-16 ENCOUNTER — Other Ambulatory Visit: Payer: Self-pay | Admitting: Family Medicine

## 2022-05-22 ENCOUNTER — Other Ambulatory Visit: Payer: Self-pay | Admitting: Psychiatry

## 2022-05-24 ENCOUNTER — Inpatient Hospital Stay: Payer: 59 | Attending: Hematology

## 2022-05-24 DIAGNOSIS — Z87891 Personal history of nicotine dependence: Secondary | ICD-10-CM | POA: Diagnosis not present

## 2022-05-24 DIAGNOSIS — D751 Secondary polycythemia: Secondary | ICD-10-CM

## 2022-05-24 DIAGNOSIS — T5891XD Toxic effect of carbon monoxide from unspecified source, accidental (unintentional), subsequent encounter: Secondary | ICD-10-CM

## 2022-05-24 LAB — CBC WITH DIFFERENTIAL/PLATELET
Abs Immature Granulocytes: 0.06 10*3/uL (ref 0.00–0.07)
Basophils Absolute: 0.1 10*3/uL (ref 0.0–0.1)
Basophils Relative: 1 %
Eosinophils Absolute: 0 10*3/uL (ref 0.0–0.5)
Eosinophils Relative: 0 %
HCT: 42.5 % (ref 36.0–46.0)
Hemoglobin: 14.3 g/dL (ref 12.0–15.0)
Immature Granulocytes: 1 %
Lymphocytes Relative: 18 %
Lymphs Abs: 2 10*3/uL (ref 0.7–4.0)
MCH: 34.1 pg — ABNORMAL HIGH (ref 26.0–34.0)
MCHC: 33.6 g/dL (ref 30.0–36.0)
MCV: 101.4 fL — ABNORMAL HIGH (ref 80.0–100.0)
Monocytes Absolute: 0.9 10*3/uL (ref 0.1–1.0)
Monocytes Relative: 8 %
Neutro Abs: 7.9 10*3/uL — ABNORMAL HIGH (ref 1.7–7.7)
Neutrophils Relative %: 72 %
Platelets: 220 10*3/uL (ref 150–400)
RBC: 4.19 MIL/uL (ref 3.87–5.11)
RDW: 13.8 % (ref 11.5–15.5)
WBC: 10.9 10*3/uL — ABNORMAL HIGH (ref 4.0–10.5)
nRBC: 0 % (ref 0.0–0.2)

## 2022-05-24 LAB — COMPREHENSIVE METABOLIC PANEL
ALT: 36 U/L (ref 0–44)
AST: 33 U/L (ref 15–41)
Albumin: 4 g/dL (ref 3.5–5.0)
Alkaline Phosphatase: 96 U/L (ref 38–126)
Anion gap: 10 (ref 5–15)
BUN: 13 mg/dL (ref 6–20)
CO2: 28 mmol/L (ref 22–32)
Calcium: 8.6 mg/dL — ABNORMAL LOW (ref 8.9–10.3)
Chloride: 96 mmol/L — ABNORMAL LOW (ref 98–111)
Creatinine, Ser: 1 mg/dL (ref 0.44–1.00)
GFR, Estimated: >60 mL/min (ref >60)
Glucose, Bld: 109 mg/dL — ABNORMAL HIGH (ref 70–99)
Potassium: 3.7 mmol/L (ref 3.5–5.1)
Sodium: 134 mmol/L — ABNORMAL LOW (ref 135–145)
Total Bilirubin: 0.4 mg/dL (ref 0.3–1.2)
Total Protein: 6.7 g/dL (ref 6.5–8.1)

## 2022-05-24 LAB — CARBOXYHEMOGLOBIN - COOX: Carboxyhemoglobin: 2.1 % — ABNORMAL HIGH (ref 0.5–1.5)

## 2022-05-24 LAB — LACTATE DEHYDROGENASE: LDH: 197 U/L — ABNORMAL HIGH (ref 98–192)

## 2022-05-28 LAB — CARBON MONOXIDE, BLOOD (PERFORMED AT REF LAB): Carbon Monoxide, Blood: 4.1 % — ABNORMAL HIGH (ref 0.0–3.6)

## 2022-05-30 ENCOUNTER — Other Ambulatory Visit: Payer: Self-pay | Admitting: Psychiatry

## 2022-05-30 ENCOUNTER — Other Ambulatory Visit: Payer: Self-pay | Admitting: *Deleted

## 2022-05-30 MED ORDER — ATORVASTATIN CALCIUM 80 MG PO TABS: 80.0000 mg | ORAL_TABLET | Freq: Every day | ORAL | 0 refills | Status: DC

## 2022-05-31 ENCOUNTER — Ambulatory Visit: Payer: Medicare Other | Admitting: Physician Assistant

## 2022-05-31 NOTE — Progress Notes (Signed)
VIRTUAL VISIT via [place]   I connected with Rebecca Moreno  on 06/01/2022 at 3:32 PM by telephone and verified that I am speaking with the correct person using two identifiers.  Location: Patient: Home Provider: [facility]   I discussed the limitations, risks, security and privacy concerns of performing an evaluation and management service by telephone and the availability of in person appointments. I also discussed with the patient that there may be a patient responsible charge related to this service. The patient expressed understanding and agreed to proceed.  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  She was last seen by Tarri Abernethy PA-C on 01/30/2022. At today's visit, she reports feeling fairly well.  She does report that she has had some voice changes (hoarseness) progressive over the past several months, which is being worked up by ENT at [facility] (Dr. Sabino Gasser).  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  Since her last visit, patient reports that she quit smoking on April 30, 2022.  She is very happy with her decision.   She denies any new CVA, MI, or blood clots since her last visit.  No aquagenic pruritus, Raynaud's, erythromelalgia, or other vasomotor symptoms.  No B symptoms.    She has 50% energy and 100% appetite. She endorses that she is maintaining a stable weight.   REVIEW OF SYSTEMS:   Review of Systems  Constitutional:  Positive for malaise/fatigue (mild baseline fatigue). Negative for chills, diaphoresis, fever and weight loss.   Respiratory:  Negative for cough and shortness of breath.   Cardiovascular:  Negative for chest pain and palpitations.  Gastrointestinal:  Negative for abdominal pain, blood in stool, melena, nausea and vomiting.  Neurological:  Negative for dizziness and headaches.     PHYSICAL EXAM: (per limitations of virtual telephone visit)  The patient is alert and oriented x 3, exhibiting adequate mentation, good mood, and ability to speak in full sentences and execute sound judgement.  ASSESSMENT & PLAN:  1.  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 - JAK2 V617F and reflex mutation testing was negative. - Erythropoietin was 15.9. White count and platelet count was normal. - History of chronic headaches. - Quit smoking on 04/30/2022 - She also has sleep apnea and reports good use of CPAP - CT renal study on 09/01/2018 showed mild left hydroureteronephrosis due to 4 mm mid left ureteral calculus. Tiny left renal calculus. Severe hepatic steatosis. - She has received phlebotomy every 2 months, most recently on 01/30/2022. - She denies any aquagenic pruritus, erythromelalgia, or other vasomotor symptoms. - Labs (05/25/2022): Hgb 14.3, hematocrit 42.5 %.  Mildly elevated LDH 197.  CMP unremarkable. - Discussed the primary treatment of secondary polycythemia and treatment of the underlying condition.  Discussed that phlebotomy may have some use for symptom relief or incidence of hematocrit >54.0 to prevent VTE, MI, CVA - PLAN: Blood counts have normalized after patient quit smoking. - We will see her for labs in 6 months and office visit 1 week after.  Will consider possible discharge to  PCP at that time if she has no further abnormalities.   2.  Carboxyhemoglobinemia: - Labs (01/06/2019): Carboxyhemoglobin was elevated at 18.4. ABG showed PO2 of 51 and PCO2 of 52.  Elevated carboxyhemoglobin was out of proportion to her smoking. - Patient denies any obvious exposure to carbon monoxide. - Most recent labs (05/24/2022): Carboxyhemoglobin mildly elevated at 2.1 with carbon monoxide mildly elevated 4.1% - significantly improved after patient quit smoking   3.  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 Right hemiparesis improved as did her speech. She continues to have some word finding difficulties. - MR angiogram of the head and neck region was done on 10/12/2019. She is on aspirin 325 mg daily. - PLAN: Continue aspirin 325 mg daily   PLAN SUMMARY: >> CANCEL appointment for phlebotomy on Monday, 06/04/2022 >> Labs in 6 months (CBC/D, carbon monoxide, carboxyhemoglobin) >> OFFICE VISIT in 6 months (1 week after labs)     I discussed the assessment and treatment plan with the patient. The patient was provided an opportunity to ask questions and all were answered. The patient agreed with the plan and demonstrated an understanding of the instructions.   The patient was advised to call back or seek an in-person evaluation if the symptoms worsen or if the condition fails to improve as anticipated.  I provided 22 minutes of non-face-to-face time during this encounter.   Harriett Rush, PA-C 06/01/2022 4:07 PM

## 2022-06-01 ENCOUNTER — Other Ambulatory Visit: Payer: Self-pay

## 2022-06-01 ENCOUNTER — Inpatient Hospital Stay (HOSPITAL_BASED_OUTPATIENT_CLINIC_OR_DEPARTMENT_OTHER): Payer: 59 | Admitting: Physician Assistant

## 2022-06-01 ENCOUNTER — Encounter: Payer: Self-pay | Admitting: Physician Assistant

## 2022-06-01 DIAGNOSIS — T5891XD Toxic effect of carbon monoxide from unspecified source, accidental (unintentional), subsequent encounter: Secondary | ICD-10-CM

## 2022-06-01 DIAGNOSIS — D751 Secondary polycythemia: Secondary | ICD-10-CM

## 2022-06-04 ENCOUNTER — Other Ambulatory Visit: Payer: Self-pay

## 2022-06-04 ENCOUNTER — Inpatient Hospital Stay: Payer: 59

## 2022-06-04 DIAGNOSIS — T5891XD Toxic effect of carbon monoxide from unspecified source, accidental (unintentional), subsequent encounter: Secondary | ICD-10-CM

## 2022-06-04 DIAGNOSIS — D751 Secondary polycythemia: Secondary | ICD-10-CM

## 2022-06-05 ENCOUNTER — Ambulatory Visit (INDEPENDENT_AMBULATORY_CARE_PROVIDER_SITE_OTHER): Payer: 59 | Admitting: Family Medicine

## 2022-06-05 ENCOUNTER — Encounter: Payer: Self-pay | Admitting: Family Medicine

## 2022-06-05 VITALS — BP 143/93 | HR 69 | Temp 98.7°F | Ht 66.0 in | Wt 197.0 lb

## 2022-06-05 DIAGNOSIS — M48061 Spinal stenosis, lumbar region without neurogenic claudication: Secondary | ICD-10-CM

## 2022-06-05 DIAGNOSIS — K219 Gastro-esophageal reflux disease without esophagitis: Secondary | ICD-10-CM

## 2022-06-05 DIAGNOSIS — M5431 Sciatica, right side: Secondary | ICD-10-CM | POA: Diagnosis not present

## 2022-06-05 DIAGNOSIS — M47816 Spondylosis without myelopathy or radiculopathy, lumbar region: Secondary | ICD-10-CM | POA: Diagnosis not present

## 2022-06-05 DIAGNOSIS — Z87891 Personal history of nicotine dependence: Secondary | ICD-10-CM

## 2022-06-05 NOTE — Progress Notes (Signed)
I have separately seen and examined the patient. I have discussed the findings and exam with student Dr Jerline Pain and agree with the below note.  My changes/additions are outlined in BLUE.    S: Low back pain Patient reports that her low back pain has really gotten a lot better since her last visit.  She is not able to secure home health physical therapy but notes that after she started getting a little bit more physical on her own but the back pain did get better.  Since her last visit she has seen the ear nose and throat doctor and they suspect that she has laryngopharyngeal reflux and transitioned her from her old PPI to Protonix.  Since her last visit she has discontinued smoking and she is very thrilled about this.  She has been 1 month in remission and is not taking Chantix anymore.  O: Vitals:   06/05/22 1315  BP: (!) 143/93  Pulse: 69  Temp: 98.7 F (37.1 C)  SpO2: 93%    Gen: well appearing female, NAD Cardio: RRR, S1S2 heard Pulm: CTAB, normal WOB on room air Neuro: hoarse quality to voice  A/P:  Spondylosis of lumbar spine  Foraminal stenosis of lumbar region  Sciatica of right side  Former tobacco use  LPRD (laryngopharyngeal reflux disease)  Glad that she has recovered from her low back pain.  No further intervention planned at this time  I congratulated her on tobacco cessation.  Eagerly awaiting to see how the new PPI affects her current hoarseness.  I agree that if this does not improve we may need to consider referral to gastroenterology for EGD.  She will let me know if I need to place a new referral to Dr. Rosine Beat. Rebecca Moreno, [facility]   -------------------------------------------------------------------------------------------------------------------------------------------------------------------------------------     Subjective: CC: Back pain  PCP: Rebecca Moreno, Rebecca Moreno OZD:Rebecca Moreno is a 57 y.o.  female with a history of asthma, CVA, and hypothyroidism presenting to clinic today for back pain.   Back pain  Patient reports that her right-sided back pain is improved from previous. She now notes that the pain feels more like discomfort from back can become slightly displaced. However, this sensation does not bother Rebecca Moreno and she does not want to pursue imaging or additional therapy modalities at this time. She notes that increased activity levels with taking care of her mother-in-law have improved her symptoms.   Laryngopharyngeal Reflux Disease Patient had appointment and scope with ENT on 05/31/22. Findings were significant for evidence of vocal cord irritation secondary to acid reflux. ENT switched acid blocking agent to Protonix and recommended lifestyle modifications to reduce acid production. Patient continues to experience voice hoarseness, no improvement from previous. No evidence of malignancy was found.   ROS: Per HPI  Allergies  Allergen Reactions   Lithium Other (See Comments)    Psoriasis    Past Medical History:  Diagnosis Date   Acute pyelonephritis    Asthma    Bipolar disorder ([place])    Colon polyp    Complication of anesthesia    "sometimes hard to put to sleep"   Depression    Bipolar   Gastric ulcer    around 2013.  stress related   GERD (gastroesophageal reflux disease)    History of kidney stones    Hyperlipidemia    Hypothyroid    Polyp, uterus corpus    Sepsis due to Escherichia coli (E. coli) ([place]) 09/04/2018  Sleep apnea    no cpap used   Stroke [facility])     Current Outpatient Medications:    albuterol (VENTOLIN HFA) 108 (90 Base) MCG/ACT inhaler, Inhale 1 puff into the lungs every 6 (six) hours as needed for wheezing or shortness of breath., Disp: 6.7 g, Rfl: 1   atorvastatin (LIPITOR) 80 MG tablet, Take 1 tablet (80 mg total) by mouth at bedtime., Disp: 100 tablet, Rfl: 0   CALCIUM CITRATE-VITAMIN D3 PO, Take 1 tablet by mouth daily., Disp: ,  Rfl:    Cholecalciferol (VITAMIN D3) 5000 units TABS, Take 5,000 Units by mouth daily. , Disp: , Rfl:    DEXILANT 30 MG capsule DR, TAKE (1) CAPSULE DAILY, Disp: 90 capsule, Rfl: 3   famotidine (PEPCID) 20 MG tablet, TAKE ONE TABLET BY MOUTH EVERY DAY, Disp: 90 tablet, Rfl: 0   FLOVENT HFA 220 MCG/ACT inhaler, INHALE 2 PUFFS TWICE DAILY, Disp: 12 g, Rfl: 0   fluticasone (FLONASE) 50 MCG/ACT nasal spray, SPRAY 2 SPRAYS INTO EACH NOSTRIL EVERY DAY, Disp: 48 mL, Rfl: 0   gabapentin (NEURONTIN) 300 MG capsule, Take 1 capsule at supper and 1 capsule at bedtime.  Further fills per Harrie Foreman, Neurology, Disp: 180 capsule, Rfl: 0   lamoTRIgine (LAMICTAL) 100 MG tablet, Take 3 tablets (300 mg total) by mouth at bedtime., Disp: 270 tablet, Rfl: 1   levothyroxine (SYNTHROID) 50 MCG tablet, TAKE 1 TABLET DAILY, Disp: 90 tablet, Rfl: 1   loratadine (CLARITIN) 10 MG tablet, TAKE 1 TABLET DAILY, Disp: 30 tablet, Rfl: 11   magnesium gluconate (MAGONATE) 500 MG tablet, Take 500 mg by mouth daily., Disp: , Rfl:    MODERNA COVID-19 BIVAL BOOSTER 50 MCG/0.5ML injection, , Disp: , Rfl:    Multiple Vitamins-Minerals (MULTIVITAMIN WOMEN PO), Take 1 tablet by mouth daily., Disp: , Rfl:    omeprazole (PRILOSEC) 20 MG capsule, Take 1 capsule (20 mg total) by mouth 2 (two) times daily., Disp: 60 capsule, Rfl: 5   pantoprazole (PROTONIX) 20 MG tablet, Take 20 mg by mouth daily., Disp: , Rfl:    [START ON 06/07/2022] QUEtiapine (SEROQUEL) 100 MG tablet, Take 1.5 tablets (150 mg total) by mouth at bedtime., Disp: 135 tablet, Rfl: 0   QUEtiapine Fumarate 150 MG TABS, Take 150 mg by mouth at bedtime., Disp: 90 tablet, Rfl: 0   tiZANidine (ZANAFLEX) 4 MG tablet, Take 0.5-1 tablets (2-4 mg total) by mouth every 8 (eight) hours as needed for muscle spasms., Disp: 60 tablet, Rfl: 0   varenicline (CHANTIX) 1 MG tablet, Take 1 mg by mouth., Disp: , Rfl:    venlafaxine XR (EFFEXOR-XR) 150 MG 24 hr capsule, Take 2 capsules (300 mg  total) by mouth daily., Disp: 180 capsule, Rfl: 1   VRAYLAR 6 MG CAPS, Take 1 capsule by mouth daily., Disp: , Rfl:    clonazePAM (KLONOPIN) 1 MG tablet, Take 1 tablet (1 mg total) by mouth 2 (two) times daily., Disp: 60 tablet, Rfl: 1 Social History   Socioeconomic History   Marital status: Married    Spouse name: Not on file   Number of children: 1   Years of education: Not on file   Highest education level: High school graduate  Occupational History   Occupation: disability  Tobacco Use   Smoking status: Every Day    Packs/day: 0.50    Years: 30.00    Total pack years: 15.00    Types: Cigarettes   Smokeless tobacco: Never   Tobacco comments:  Is smoking about 0.5 of a pack and has a patch placed on   Vaping Use   Vaping Use: Never used  Substance and Sexual Activity   Alcohol use: Never   Drug use: Not Currently   Sexual activity: Yes    Partners: Male    Birth control/protection: Surgical    Comment: tubal  Other Topics Concern   Not on file  Social History Narrative   Not on file   Social Determinants of Health   Financial Resource Strain: High Risk (07/10/2021)   Overall Financial Resource Strain (CARDIA)    Difficulty of Paying Living Expenses: Hard  Food Insecurity: Food Insecurity Present (07/10/2021)   Hunger Vital Sign    Worried About Running Out of Food in the Last Year: Often true    Ran Out of Food in the Last Year: Often true  Transportation Needs: No Transportation Needs (07/10/2021)   PRAPARE - Hydrologist (Medical): No    Lack of Transportation (Non-Medical): No  Physical Activity: Insufficiently Active (07/10/2021)   Exercise Vital Sign    Days of Exercise per Week: 3 days    Minutes of Exercise per Session: 20 min  Stress: No Stress Concern Present (07/10/2021)   [place]    Feeling of Stress : Not at all  Social Connections: Moderately Isolated  (07/10/2021)   Social Connection and Isolation Panel [NHANES]    Frequency of Communication with Friends and Family: More than three times a week    Frequency of Social Gatherings with Friends and Family: More than three times a week    Attends Religious Services: Never    Marine scientist or Organizations: No    Attends Archivist Meetings: Never    Marital Status: Married  Human resources officer Violence: Not At Risk (07/10/2021)   Humiliation, Afraid, Rape, and Kick questionnaire    Fear of Current or Ex-Partner: No    Emotionally Abused: No    Physically Abused: No    Sexually Abused: No   Family History  Problem Relation Age of Onset   Anxiety disorder Mother    Hypertension Mother    Heart failure Mother    Stroke Mother    Hyperlipidemia Father    Diabetes Father    Stroke Father    Hypertension Father    Colon polyps Father        older than 24   Diabetes Sister    Hypertension Sister    Ovarian cancer Sister    Ovarian cancer Maternal Grandmother    Colon cancer Maternal Grandmother    Renal Disease Paternal Grandmother    Heart attack Paternal Grandfather    Asthma Daughter    Bipolar disorder Other    Asthma Niece     Objective: Office vital signs reviewed. BP (!) 143/93   Pulse 69   Temp 98.7 F (37.1 C)   Ht '5\' 6"'$  (1.676 m)   Wt 197 lb (89.4 kg)   LMP 12/13/2017 (Approximate)   SpO2 93%   BMI 31.80 kg/m   Physical Exam General: Awake, alert, well nourished, No acute distress Cardio: regular rate and rhythm, S1S2 heard, no murmurs appreciated Pulm: clear to auscultation bilaterally, no wheezes, rhonchi or rales; normal work of breathing on room air Extremities: warm, well perfused, No edema, cyanosis or clubbing; +1 pulses bilaterally Skin: dry; intact; no rashes or lesions  Assessment/ Plan: 57 y.o. female  I am pleased that patient's back problems have improved with increased physical activity. Patient continues to experience  hoarseness with no improvement from previous. She has never had an esophageal scope with biopsy. If no improvement to symptoms with Protonix, I would like patient to follow-up with GI for scope and biopsy to rule out other causes of irritation such as eosinophilic esophagitis.   Stephani Police, MS3

## 2022-06-12 ENCOUNTER — Other Ambulatory Visit: Payer: Self-pay | Admitting: Psychiatry

## 2022-06-17 NOTE — Progress Notes (Deleted)
BH MD/PA/NP OP Progress Note  06/17/2022 3:32 PM Rebecca Moreno  MRN:  [id number]  Chief Complaint: No chief complaint on file.  HPI: *** Visit Diagnosis: No diagnosis found.  Past Psychiatric History: Please see initial evaluation for full details. I have reviewed the history. No updates at this time.     Past Medical History:  Past Medical History:  Diagnosis Date   Acute pyelonephritis    Asthma    Bipolar disorder ([place])    Colon polyp    Complication of anesthesia    "sometimes hard to put to sleep"   Depression    Bipolar   Gastric ulcer    around 2013.  stress related   GERD (gastroesophageal reflux disease)    History of kidney stones    Hyperlipidemia    Hypothyroid    Polyp, uterus corpus    Sepsis due to Escherichia coli (E. coli) ([place]) 09/04/2018   Sleep apnea    no cpap used   Stroke [facility])     Past Surgical History:  Procedure Laterality Date   ABLATION ON ENDOMETRIOSIS     COLONOSCOPY     Per patient, done around 2013 in [place], had polyp and overdue for follow up.   COLONOSCOPY WITH PROPOFOL N/A 05/12/2018   Procedure: COLONOSCOPY WITH PROPOFOL;  Surgeon: Daneil Dolin, MD;  Location: [facility];  Service: Endoscopy;  Laterality: N/A;  12:00pm   CYSTOSCOPY W/ URETERAL STENT PLACEMENT Left 09/01/2018   Procedure: CYSTOSCOPY WITH RETROGRADE PYELOGRAM/URETERAL STENT PLACEMENT;  Surgeon: Ceasar Mons, MD;  Location: [facility];  Service: Urology;  Laterality: Left;   CYSTOSCOPY/URETEROSCOPY/HOLMIUM LASER/STENT PLACEMENT Left 09/17/2018   Procedure: CYSTOSCOPY/URETEROSCOPY/HOLMIUM LASER/STENT PLACEMENT;  Surgeon: Ceasar Mons, MD;  Location: [facility];  Service: Urology;  Laterality: Left;  ONLY NEEDS 45 MIN   OVARIAN CYST SURGERY Left    POLYPECTOMY  05/12/2018   Procedure: POLYPECTOMY;  Surgeon: Daneil Dolin, MD;  Location: [facility];  Service: Endoscopy;;  polyp at splenic flexure   TEMPOROMANDIBULAR JOINT SURGERY     9  surgeries   TUBAL LIGATION     vaginal childbirth     1    Family Psychiatric History: Please see initial evaluation for full details. I have reviewed the history. No updates at this time.     Family History:  Family History  Problem Relation Age of Onset   Anxiety disorder Mother    Hypertension Mother    Heart failure Mother    Stroke Mother    Hyperlipidemia Father    Diabetes Father    Stroke Father    Hypertension Father    Colon polyps Father        older than 63   Diabetes Sister    Hypertension Sister    Ovarian cancer Sister    Ovarian cancer Maternal Grandmother    Colon cancer Maternal Grandmother    Renal Disease Paternal Grandmother    Heart attack Paternal Grandfather    Asthma Daughter    Bipolar disorder Other    Asthma Niece     Social History:  Social History   Socioeconomic History   Marital status: Married    Spouse name: Not on file   Number of children: 1   Years of education: Not on file   Highest education level: High school graduate  Occupational History   Occupation: disability  Tobacco Use   Smoking status: Every Day    Packs/day: 0.50  Years: 30.00    Total pack years: 15.00    Types: Cigarettes   Smokeless tobacco: Never   Tobacco comments:    Is smoking about 0.5 of a pack and has a patch placed on   Vaping Use   Vaping Use: Never used  Substance and Sexual Activity   Alcohol use: Never   Drug use: Not Currently   Sexual activity: Yes    Partners: Male    Birth control/protection: Surgical    Comment: tubal  Other Topics Concern   Not on file  Social History Narrative   Not on file   Social Determinants of Health   Financial Resource Strain: High Risk (07/10/2021)   Overall Financial Resource Strain (CARDIA)    Difficulty of Paying Living Expenses: Hard  Food Insecurity: Food Insecurity Present (07/10/2021)   Hunger Vital Sign    Worried About Running Out of Food in the Last Year: Often true    Ran Out of Food in  the Last Year: Often true  Transportation Needs: No Transportation Needs (07/10/2021)   PRAPARE - Hydrologist (Medical): No    Lack of Transportation (Non-Medical): No  Physical Activity: Insufficiently Active (07/10/2021)   Exercise Vital Sign    Days of Exercise per Week: 3 days    Minutes of Exercise per Session: 20 min  Stress: No Stress Concern Present (07/10/2021)   [place]    Feeling of Stress : Not at all  Social Connections: Moderately Isolated (07/10/2021)   Social Connection and Isolation Panel [NHANES]    Frequency of Communication with Friends and Family: More than three times a week    Frequency of Social Gatherings with Friends and Family: More than three times a week    Attends Religious Services: Never    Marine scientist or Organizations: No    Attends Archivist Meetings: Never    Marital Status: Married    Allergies:  Allergies  Allergen Reactions   Lithium Other (See Comments)    Psoriasis     Metabolic Disorder Labs: Lab Results  Component Value Date   HGBA1C 5.2 02/20/2021   No results found for: "PROLACTIN" Lab Results  Component Value Date   CHOL 212 (H) 12/18/2018   TRIG 171 (H) 12/18/2018   HDL 44 12/18/2018   CHOLHDL 4.8 (H) 12/18/2018   LDLCALC 134 (H) 12/18/2018   LDLCALC 168 (H) 01/15/2018   Lab Results  Component Value Date   TSH 1.100 09/04/2021   TSH 0.772 02/20/2021    Therapeutic Level Labs: No results found for: "LITHIUM" No results found for: "VALPROATE" No results found for: "CBMZ"  Current Medications: Current Outpatient Medications  Medication Sig Dispense Refill   albuterol (VENTOLIN HFA) 108 (90 Base) MCG/ACT inhaler Inhale 1 puff into the lungs every 6 (six) hours as needed for wheezing or shortness of breath. 6.7 g 1   atorvastatin (LIPITOR) 80 MG tablet Take 1 tablet (80 mg total) by mouth at bedtime. 100  tablet 0   CALCIUM CITRATE-VITAMIN D3 PO Take 1 tablet by mouth daily.     Cariprazine HCl (VRAYLAR) 6 MG CAPS Take 1 capsule (6 mg total) by mouth daily. 90 capsule 0   Cholecalciferol (VITAMIN D3) 5000 units TABS Take 5,000 Units by mouth daily.      clonazePAM (KLONOPIN) 1 MG tablet Take 1 tablet (1 mg total) by mouth 2 (two) times daily. 60 tablet  1   DEXILANT 30 MG capsule DR TAKE (1) CAPSULE DAILY 90 capsule 3   famotidine (PEPCID) 20 MG tablet TAKE ONE TABLET BY MOUTH EVERY DAY 90 tablet 0   FLOVENT HFA 220 MCG/ACT inhaler INHALE 2 PUFFS TWICE DAILY 12 g 0   fluticasone (FLONASE) 50 MCG/ACT nasal spray SPRAY 2 SPRAYS INTO EACH NOSTRIL EVERY DAY 48 mL 0   gabapentin (NEURONTIN) 300 MG capsule Take 1 capsule at supper and 1 capsule at bedtime.  Further fills per Harrie Foreman, Neurology 180 capsule 0   lamoTRIgine (LAMICTAL) 100 MG tablet Take 3 tablets (300 mg total) by mouth at bedtime. 270 tablet 1   levothyroxine (SYNTHROID) 50 MCG tablet TAKE 1 TABLET DAILY 90 tablet 1   loratadine (CLARITIN) 10 MG tablet TAKE 1 TABLET DAILY 30 tablet 11   magnesium gluconate (MAGONATE) 500 MG tablet Take 500 mg by mouth daily.     MODERNA COVID-19 BIVAL BOOSTER 50 MCG/0.5ML injection      Multiple Vitamins-Minerals (MULTIVITAMIN WOMEN PO) Take 1 tablet by mouth daily.     omeprazole (PRILOSEC) 20 MG capsule Take 1 capsule (20 mg total) by mouth 2 (two) times daily. 60 capsule 5   pantoprazole (PROTONIX) 20 MG tablet Take 20 mg by mouth daily.     QUEtiapine (SEROQUEL) 100 MG tablet Take 1.5 tablets (150 mg total) by mouth at bedtime. 135 tablet 0   QUEtiapine Fumarate 150 MG TABS Take 150 mg by mouth at bedtime. 90 tablet 0   tiZANidine (ZANAFLEX) 4 MG tablet Take 0.5-1 tablets (2-4 mg total) by mouth every 8 (eight) hours as needed for muscle spasms. 60 tablet 0   varenicline (CHANTIX) 1 MG tablet Take 1 mg by mouth.     venlafaxine XR (EFFEXOR-XR) 150 MG 24 hr capsule Take 2 capsules (300 mg total)  by mouth daily. 180 capsule 1   No current facility-administered medications for this visit.     Musculoskeletal: Strength & Muscle Tone: within normal limits Gait & Station: normal Patient leans: N/A  Psychiatric Specialty Exam: Review of Systems  Last menstrual period 12/13/2017.There is no height or weight on file to calculate BMI.  General Appearance: {Appearance:22683}  Eye Contact:  {BHH EYE CONTACT:22684}  Speech:  Clear and Coherent  Volume:  Normal  Mood:  {BHH MOOD:22306}  Affect:  {Affect (PAA):22687}  Thought Process:  Coherent  Orientation:  Full (Time, Place, and Person)  Thought Content: Logical   Suicidal Thoughts:  {ST/HT (PAA):22692}  Homicidal Thoughts:  {ST/HT (PAA):22692}  Memory:  Immediate;   Good  Judgement:  {Judgement (PAA):22694}  Insight:  {Insight (PAA):22695}  Psychomotor Activity:  Normal  Concentration:  Concentration: Good and Attention Span: Good  Recall:  Good  Fund of Knowledge: Good  Language: Good  Akathisia:  No  Handed:  Right  AIMS (if indicated): not done  Assets:  Communication Skills Desire for Improvement  ADL's:  Intact  Cognition: WNL  Sleep:  {BHH GOOD/FAIR/POOR:22877}   Screenings: GAD-7    Flowsheet Row Office Visit from 06/05/2022 in [place] Office Visit from 03/20/2022 in [place] Office Visit from 02/21/2022 in [place] Office Visit from 01/17/2022 in [place] Office Visit from 08/28/2021 in [place]  Total GAD-7 Score 10 13 0 0 [place] Office Visit from 08/28/2021 in [place]  [facility] Clinical Support from 07/22/2018 in [facility]  Total Score (max 30 points ) 29 30      PHQ2-9    [place] Office Visit from 06/05/2022 in [place] Office Visit from 03/20/2022 in [facility] Office Visit from 02/21/2022 in [facility] Office Visit from 01/17/2022 in [place] Office Visit from 10/19/2021 in [place]  PHQ-2 Total Score 1 1 0 0 1  PHQ-9 Total Score 3 6 0 0 --      [place] Office Visit from 10/19/2021 in [place] Video Visit from 11/15/2020 in [place] Video Visit from 08/18/2020 in [place] No Risk No Risk No Risk        Assessment and Plan:  Rebecca Moreno is a 57 y.o. year old female with a history of  depression, hypothyroidism, obstructive sleep apnea (CPAP machine), stroke, Hyperlipidemia, Hypertension, stroke, who presents for follow up appointment for below.   Acute stressors include:  Other stressors include: being a caregiver of her mother in law, marital conflict    History: diagnosed with bipolar I disorder when she was in [place]. Episodes of delusion, [place] in the context of depression in the past (originally on venlafaxine 300 mg, lamotrigine 300 mg daily, vraylar 6 mg, quetiapine 300 mg qhs, clonazepam 1 mg BID)   1. MDD (major depressive disorder), recurrent, in partial remission ([place]) 2. Anxiety state # Bipolar I disorder by history Exam is notable for calmer affect, and she denies any change in her mood symptoms despite tapering down the dose of quetiapine. Psychosocial stressors includes being a caregiver of her mother-in-law, and marital conflict.  Will continue to lower the dose of quetiapine to avoid polypharmacy.  Will continue venlafaxine, lamotrigine to target depression.  Will continue Vraylar to target depression and mood dysregulation.  Will continue clonazepam  as needed for anxiety.  Discussed with the patient that this medication will be tapered off in the future to avoid risk of dependence, over sedation.  Although she verbalized agreement, she declined to do this at this time.  There has been no escalation of the dose /no concern of misuse of this medication . Noted that she has been on both quetiapine and Vraylar when she was first seen by this provider under the diagnosis of bipolar I disorder. Both the patient and her husband reports history of paranoia and irritability, visual hallucination in the context of depression in the past, and it is unclear whether she had manic symptoms otherwise. Unable to obtain record from her previous psychiatrist despite the attempt.      Plan   Continue venlafaxine 300 mg daily Continue Lamotrigine 300 mg daily Continue Vraylar 6 mg daily Decrease quetiapine 150 mg at night She agrees to obtain EKG at the next visit to monitor QTc prolongation Continue clonazepam 1 mg twice a day as needed for anxiety  Next appointment: 1/4 at 3:30, in person. She will continue to see this Probation officer, although transfer to [facility] was attempted in the past.  Obtain lab - lipid      Past trials of medication: sertraline, fluoxetine, lexapro, Celexa, duloxetine, Wellbutrin  lithium (psoriasis), Depakote (did not work), olanzapine, Abilify (weight gain), latuda, Geodon    The patient demonstrates  the following risk factors for suicide: Chronic risk factors for suicide include: psychiatric disorder of depression. Acute risk factors for suicide include: family or marital conflict and unemployment. Protective factors for this patient include: positive social support and hope for the future. Considering these factors, the overall suicide risk at this point appears to be low. Patient is appropriate for outpatient follow up.  Collaboration of Care: Collaboration of Care: {BH OP Collaboration of Care:21014065}  Patient/Guardian was advised  Release of Information must be obtained prior to any record release in order to collaborate their care with an outside provider. Patient/Guardian was advised if they have not already done so to contact the registration department to sign all necessary forms in order for [place] to release information regarding their care.   Consent: Patient/Guardian gives verbal consent for treatment and assignment of benefits for services provided during this visit. Patient/Guardian expressed understanding and agreed to proceed.    Norman Clay, MD 06/17/2022, 3:32 PM

## 2022-06-19 ENCOUNTER — Telehealth: Payer: Self-pay

## 2022-06-19 ENCOUNTER — Ambulatory Visit: Payer: No Typology Code available for payment source | Admitting: Psychiatry

## 2022-06-19 MED ORDER — DEXLANSOPRAZOLE 30 MG PO CPDR: DELAYED_RELEASE_CAPSULE | ORAL | 3 refills | Status: DC

## 2022-06-19 NOTE — Telephone Encounter (Signed)
Stu from [facility] called about patient's Dexilant.  The prescription is written as brand name and when he tried to refill it, the insurance company is requiring a prior authorization.  Unless you know of a reason the patient cannot take the generic, he would like for you to send in the generic to see if they will pay for that.  If they will not or the patient cannot take, he will send over the information for [place] to complete a prior authorization.

## 2022-06-19 NOTE — Telephone Encounter (Signed)
done 

## 2022-06-27 ENCOUNTER — Other Ambulatory Visit: Payer: Self-pay | Admitting: Family Medicine

## 2022-06-27 DIAGNOSIS — J301 Allergic rhinitis due to pollen: Secondary | ICD-10-CM

## 2022-06-30 ENCOUNTER — Other Ambulatory Visit: Payer: Self-pay | Admitting: Psychiatry

## 2022-07-16 ENCOUNTER — Ambulatory Visit (INDEPENDENT_AMBULATORY_CARE_PROVIDER_SITE_OTHER): Payer: 59

## 2022-07-16 VITALS — Ht 66.0 in | Wt 197.0 lb

## 2022-07-16 DIAGNOSIS — Z0000 Encounter for general adult medical examination without abnormal findings: Secondary | ICD-10-CM | POA: Diagnosis not present

## 2022-07-16 NOTE — Patient Instructions (Addendum)
Rebecca Moreno , Thank you for taking time to come for your Medicare Wellness Visit. I appreciate your ongoing commitment to your health goals. Please review the following plan we discussed and let me know if I can assist you in the future.   These are the goals we discussed:  Goals       Exercise 150 min/wk Moderate Activity      Walking is a great option. 07/10/21-Encouraged pt to move more.      Weight (lb) < 180 lb (81.6 kg) (pt-stated)        This is a list of the screening recommended for you and due dates:  Health Maintenance  Topic Date Due   COVID-19 Vaccine (7 - 2023-24 season) 07/09/2022   Mammogram  02/22/2023*   Pap Smear  09/30/2022   Colon Cancer Screening  05/13/2023   Medicare Annual Wellness Visit  07/16/2023   DTaP/Tdap/Td vaccine (2 - Td or Tdap) 01/05/2026   Flu Shot  Completed   Hepatitis C Screening: USPSTF Recommendation to screen - Ages 18-79 yo.  Completed   HIV Screening  Completed   Zoster (Shingles) Vaccine  Completed   HPV Vaccine  Aged Out  *Topic was postponed. The date shown is not the original due date.    Advanced directives: Forms are available if you choose in the future to pursue completion.  This is recommended in order to make sure that your health wishes are honored in the event that you are unable to verbalize them to the provider.    Conditions/risks identified: Aim for 30 minutes of exercise or brisk walking, 6-8 glasses of water, and 5 servings of fruits and vegetables each day.   Next appointment: Follow up in one year for your annual wellness visit.   Preventive Care 40-64 Years, Female Preventive care refers to lifestyle choices and visits with your health care provider that can promote health and wellness. What does preventive care include? A yearly physical exam. This is also called an annual well check. Dental exams once or twice a year. Routine eye exams. Ask your health care provider how often you should have your eyes  checked. Personal lifestyle choices, including: Daily care of your teeth and gums. Regular physical activity. Eating a healthy diet. Avoiding tobacco and drug use. Limiting alcohol use. Practicing safe sex. Taking low-dose aspirin daily starting at age 97. Taking vitamin and mineral supplements as recommended by your health care provider. What happens during an annual well check? The services and screenings done by your health care provider during your annual well check will depend on your age, overall health, lifestyle risk factors, and family history of disease. Counseling  Your health care provider may ask you questions about your: Alcohol use. Tobacco use. Drug use. Emotional well-being. Home and relationship well-being. Sexual activity. Eating habits. Work and work Statistician. Method of birth control. Menstrual cycle. Pregnancy history. Screening  You may have the following tests or measurements: Height, weight, and BMI. Blood pressure. Lipid and cholesterol levels. These may be checked every 5 years, or more frequently if you are over 9 years old. Skin check. Lung cancer screening. You may have this screening every year starting at age 26 if you have a 30-pack-year history of smoking and currently smoke or have quit within the past 15 years. Fecal occult blood test (FOBT) of the stool. You may have this test every year starting at age 22. Flexible sigmoidoscopy or colonoscopy. You may have a sigmoidoscopy every 5 years  or a colonoscopy every 10 years starting at age 69. Hepatitis C blood test. Hepatitis B blood test. Sexually transmitted disease (STD) testing. Diabetes screening. This is done by checking your blood sugar (glucose) after you have not eaten for a while (fasting). You may have this done every 1-3 years. Mammogram. This may be done every 1-2 years. Talk to your health care provider about when you should start having regular mammograms. This may depend on  whether you have a family history of breast cancer. BRCA-related cancer screening. This may be done if you have a family history of breast, ovarian, tubal, or peritoneal cancers. Pelvic exam and Pap test. This may be done every 3 years starting at age 44. Starting at age 84, this may be done every 5 years if you have a Pap test in combination with an HPV test. Bone density scan. This is done to screen for osteoporosis. You may have this scan if you are at high risk for osteoporosis. Discuss your test results, treatment options, and if necessary, the need for more tests with your health care provider. Vaccines  Your health care provider may recommend certain vaccines, such as: Influenza vaccine. This is recommended every year. Tetanus, diphtheria, and acellular pertussis (Tdap, Td) vaccine. You may need a Td booster every 10 years. Zoster vaccine. You may need this after age 76. Pneumococcal 13-valent conjugate (PCV13) vaccine. You may need this if you have certain conditions and were not previously vaccinated. Pneumococcal polysaccharide (PPSV23) vaccine. You may need one or two doses if you smoke cigarettes or if you have certain conditions. Talk to your health care provider about which screenings and vaccines you need and how often you need them. This information is not intended to replace advice given to you by your health care provider. Make sure you discuss any questions you have with your health care provider. Document Released: 05/20/2015 Document Revised: 01/11/2016 Document Reviewed: 02/22/2015 Elsevier Interactive Patient Education  2017 [place] Prevention in the Home Falls can cause injuries. They can happen to people of all ages. There are many things you can do to make your home safe and to help prevent falls. What can I do on the outside of my home? Regularly fix the edges of walkways and driveways and fix any cracks. Remove anything that might make you trip as you  walk through a door, such as a raised step or threshold. Trim any bushes or trees on the path to your home. Use bright outdoor lighting. Clear any walking paths of anything that might make someone trip, such as rocks or tools. Regularly check to see if handrails are loose or broken. Make sure that both sides of any steps have handrails. Any raised decks and porches should have guardrails on the edges. Have any leaves, snow, or ice cleared regularly. Use sand or salt on walking paths during winter. Clean up any spills in your garage right away. This includes oil or grease spills. What can I do in the bathroom? Use night lights. Install grab bars by the toilet and in the tub and shower. Do not use towel bars as grab bars. Use non-skid mats or decals in the tub or shower. If you need to sit down in the shower, use a plastic, non-slip stool. Keep the floor dry. Clean up any water that spills on the floor as soon as it happens. Remove soap buildup in the tub or shower regularly. Attach bath mats securely with double-sided non-slip  rug tape. Do not have throw rugs and other things on the floor that can make you trip. What can I do in the bedroom? Use night lights. Make sure that you have a light by your bed that is easy to reach. Do not use any sheets or blankets that are too big for your bed. They should not hang down onto the floor. Have a firm chair that has side arms. You can use this for support while you get dressed. Do not have throw rugs and other things on the floor that can make you trip. What can I do in the kitchen? Clean up any spills right away. Avoid walking on wet floors. Keep items that you use a lot in easy-to-reach places. If you need to reach something above you, use a strong step stool that has a grab bar. Keep electrical cords out of the way. Do not use floor polish or wax that makes floors slippery. If you must use wax, use non-skid floor wax. Do not have throw rugs  and other things on the floor that can make you trip. What can I do with my stairs? Do not leave any items on the stairs. Make sure that there are handrails on both sides of the stairs and use them. Fix handrails that are broken or loose. Make sure that handrails are as long as the stairways. Check any carpeting to make sure that it is firmly attached to the stairs. Fix any carpet that is loose or worn. Avoid having throw rugs at the top or bottom of the stairs. If you do have throw rugs, attach them to the floor with carpet tape. Make sure that you have a light switch at the top of the stairs and the bottom of the stairs. If you do not have them, ask someone to add them for you. What else can I do to help prevent falls? Wear shoes that: Do not have high heels. Have rubber bottoms. Are comfortable and fit you well. Are closed at the toe. Do not wear sandals. If you use a stepladder: Make sure that it is fully opened. Do not climb a closed stepladder. Make sure that both sides of the stepladder are locked into place. Ask someone to hold it for you, if possible. Clearly mark and make sure that you can see: Any grab bars or handrails. First and last steps. Where the edge of each step is. Use tools that help you move around (mobility aids) if they are needed. These include: Canes. Walkers. Scooters. Crutches. Turn on the lights when you go into a dark area. Replace any light bulbs as soon as they burn out. Set up your furniture so you have a clear path. Avoid moving your furniture around. If any of your floors are uneven, fix them. If there are any pets around you, be aware of where they are. Review your medicines with your doctor. Some medicines can make you feel dizzy. This can increase your chance of falling. Ask your doctor what other things that you can do to help prevent falls. This information is not intended to replace advice given to you by your health care provider. Make sure  you discuss any questions you have with your health care provider. Document Released: 02/17/2009 Document Revised: 09/29/2015 Document Reviewed: 05/28/2014 Elsevier Interactive Patient Education  2017 [employer].

## 2022-07-16 NOTE — Progress Notes (Signed)
Subjective:   Rebecca Moreno is a 57 y.o. female who presents for Medicare Annual (Subsequent) preventive examination.  I connected with  [name] on 07/16/22 by a audio enabled telemedicine application and verified that I am speaking with the correct person using two identifiers.  Patient Location: Home  Provider Location: Home Office  I discussed the limitations of evaluation and management by telemedicine. The patient expressed understanding and agreed to proceed.  Review of Systems   Cardiac Risk Factors include: smoking/ tobacco exposure;sedentary lifestyle     Objective:    Today's Vitals   07/16/22 1611  Weight: 197 lb (89.4 kg)  Height: '5\' 6"'$  (1.676 m)   Body mass index is 31.8 kg/m.     07/16/2022    5:00 PM 06/01/2022   12:20 PM 01/30/2022    1:27 PM 01/30/2022    1:10 PM 11/24/2021    1:26 PM 09/22/2021   10:45 AM 07/24/2021    2:45 PM  Advanced Directives  Does Patient Have a Medical Advance Directive? No No No No Yes Yes Yes  Type of Advance Directive     Living will;Healthcare Power of Attorney Living will Living will  Does patient want to make changes to medical advance directive?     No - Patient declined No - Patient declined   Copy of [place] in Chart?      No - copy requested No - copy requested  Would patient like information on creating a medical advance directive? No - Patient declined No - Patient declined No - Patient declined No - Patient declined No - Patient declined No - Patient declined No - Patient declined    Current Medications (verified) Outpatient Encounter Medications as of 07/16/2022  Medication Sig   albuterol (VENTOLIN HFA) 108 (90 Base) MCG/ACT inhaler Inhale 1 puff into the lungs every 6 (six) hours as needed for wheezing or shortness of breath.   atorvastatin (LIPITOR) 80 MG tablet Take 1 tablet (80 mg total) by mouth at bedtime.   CALCIUM CITRATE-VITAMIN D3 PO Take 1 tablet by mouth daily.    Cariprazine HCl (VRAYLAR) 6 MG CAPS Take 1 capsule (6 mg total) by mouth daily.   Cholecalciferol (VITAMIN D3) 5000 units TABS Take 5,000 Units by mouth daily.    clonazePAM (KLONOPIN) 1 MG tablet Take 1 tablet (1 mg total) by mouth 2 (two) times daily.   Dexlansoprazole (DEXILANT) 30 MG capsule DR TAKE (1) CAPSULE DAILY   famotidine (PEPCID) 20 MG tablet TAKE ONE TABLET BY MOUTH EVERY DAY   FLOVENT HFA 220 MCG/ACT inhaler INHALE 2 PUFFS TWICE DAILY   fluticasone (FLONASE) 50 MCG/ACT nasal spray SPRAY 2 SPRAYS INTO EACH NOSTRIL EVERY DAY   gabapentin (NEURONTIN) 300 MG capsule Take 1 capsule at supper and 1 capsule at bedtime.  Further fills per Harrie Foreman, Neurology   lamoTRIgine (LAMICTAL) 100 MG tablet Take 3 tablets (300 mg total) by mouth at bedtime.   levothyroxine (SYNTHROID) 50 MCG tablet TAKE 1 TABLET DAILY   loratadine (CLARITIN) 10 MG tablet TAKE 1 TABLET DAILY   magnesium gluconate (MAGONATE) 500 MG tablet Take 500 mg by mouth daily.   MODERNA COVID-19 BIVAL BOOSTER 50 MCG/0.5ML injection    Multiple Vitamins-Minerals (MULTIVITAMIN WOMEN PO) Take 1 tablet by mouth daily.   QUEtiapine (SEROQUEL) 100 MG tablet Take 1.5 tablets (150 mg total) by mouth at bedtime.   tiZANidine (ZANAFLEX) 4 MG tablet Take 0.5-1 tablets (2-4 mg total) by mouth  every 8 (eight) hours as needed for muscle spasms.   varenicline (CHANTIX) 1 MG tablet Take 1 mg by mouth.   venlafaxine XR (EFFEXOR-XR) 150 MG 24 hr capsule Take 2 capsules (300 mg total) by mouth daily.   QUEtiapine Fumarate 150 MG TABS Take 150 mg by mouth at bedtime.   No facility-administered encounter medications on file as of 07/16/2022.    Allergies (verified) Lithium   History: Past Medical History:  Diagnosis Date   Acute pyelonephritis    Asthma    Bipolar disorder ([place])    Colon polyp    Complication of anesthesia    "sometimes hard to put to sleep"   Depression    Bipolar   Gastric ulcer    around 2013.  stress related    GERD (gastroesophageal reflux disease)    History of kidney stones    Hyperlipidemia    Hypothyroid    Polyp, uterus corpus    Sepsis due to Escherichia coli (E. coli) ([place]) 09/04/2018   Sleep apnea    no cpap used   Stroke [facility])    Past Surgical History:  Procedure Laterality Date   ABLATION ON ENDOMETRIOSIS     COLONOSCOPY     Per patient, done around 2013 in [place], had polyp and overdue for follow up.   COLONOSCOPY WITH PROPOFOL N/A 05/12/2018   Procedure: COLONOSCOPY WITH PROPOFOL;  Surgeon: Daneil Dolin, MD;  Location: [facility];  Service: Endoscopy;  Laterality: N/A;  12:00pm   CYSTOSCOPY W/ URETERAL STENT PLACEMENT Left 09/01/2018   Procedure: CYSTOSCOPY WITH RETROGRADE PYELOGRAM/URETERAL STENT PLACEMENT;  Surgeon: Ceasar Mons, MD;  Location: [facility];  Service: Urology;  Laterality: Left;   CYSTOSCOPY/URETEROSCOPY/HOLMIUM LASER/STENT PLACEMENT Left 09/17/2018   Procedure: CYSTOSCOPY/URETEROSCOPY/HOLMIUM LASER/STENT PLACEMENT;  Surgeon: Ceasar Mons, MD;  Location: [facility];  Service: Urology;  Laterality: Left;  ONLY NEEDS 45 MIN   OVARIAN CYST SURGERY Left    POLYPECTOMY  05/12/2018   Procedure: POLYPECTOMY;  Surgeon: Daneil Dolin, MD;  Location: [facility];  Service: Endoscopy;;  polyp at splenic flexure   TEMPOROMANDIBULAR JOINT SURGERY     9 surgeries   TUBAL LIGATION     vaginal childbirth     1   Family History  Problem Relation Age of Onset   Anxiety disorder Mother    Hypertension Mother    Heart failure Mother    Stroke Mother    Hyperlipidemia Father    Diabetes Father    Stroke Father    Hypertension Father    Colon polyps Father        older than 24   Diabetes Sister    Hypertension Sister    Ovarian cancer Sister    Ovarian cancer Maternal Grandmother    Colon cancer Maternal Grandmother    Renal Disease Paternal Grandmother    Heart attack Paternal Grandfather    Asthma Daughter    Bipolar disorder Other     Asthma Niece    Social History   Socioeconomic History   Marital status: Married    Spouse name: Not on file   Number of children: 1   Years of education: Not on file   Highest education level: High school graduate  Occupational History   Occupation: disability  Tobacco Use   Smoking status: Every Day    Packs/day: 0.50    Years: 30.00    Total pack years: 15.00    Types: Cigarettes   Smokeless tobacco: Never  Tobacco comments:    Is smoking about 0.5 of a pack and has a patch placed on   Vaping Use   Vaping Use: Never used  Substance and Sexual Activity   Alcohol use: Never   Drug use: Not Currently   Sexual activity: Yes    Partners: Male    Birth control/protection: Surgical    Comment: tubal  Other Topics Concern   Not on file  Social History Narrative   Not on file   Social Determinants of Health   Financial Resource Strain: Low Risk  (07/16/2022)   Overall Financial Resource Strain (CARDIA)    Difficulty of Paying Living Expenses: Not very hard  Food Insecurity: Food Insecurity Present (07/16/2022)   Hunger Vital Sign    Worried About Running Out of Food in the Last Year: Sometimes true    Ran Out of Food in the Last Year: Sometimes true  Transportation Needs: No Transportation Needs (07/16/2022)   PRAPARE - Hydrologist (Medical): No    Lack of Transportation (Non-Medical): No  Physical Activity: Inactive (07/16/2022)   Exercise Vital Sign    Days of Exercise per Week: 0 days    Minutes of Exercise per Session: 0 min  Stress: No Stress Concern Present (07/16/2022)   [place]    Feeling of Stress : Not at all  Social Connections: Moderately Isolated (07/16/2022)   Social Connection and Isolation Panel [NHANES]    Frequency of Communication with Friends and Family: More than three times a week    Frequency of Social Gatherings with Friends and Family: More than  three times a week    Attends Religious Services: Never    Marine scientist or Organizations: No    Attends Music therapist: Never    Marital Status: Married    Tobacco Counseling Ready to quit: Not Answered Counseling given: Not Answered Tobacco comments: Is smoking about 0.5 of a pack and has a patch placed on    Clinical Intake:  Pre-visit preparation completed: Yes  Pain : No/denies pain  Diabetes: No  How often do you need to have someone help you when you read instructions, pamphlets, or other written materials from your doctor or pharmacy?: 1 - Never  Diabetic?No  Interpreter Needed?: No  Information entered by :: Denman George LPN   Activities of Daily Living    07/16/2022    5:01 PM  In your present state of health, do you have any difficulty performing the following activities:  Hearing? 0  Vision? 0  Difficulty concentrating or making decisions? 0  Walking or climbing stairs? 0  Dressing or bathing? 0  Doing errands, shopping? 0  Preparing Food and eating ? N  Using the Toilet? N  In the past six months, have you accidently leaked urine? N  Do you have problems with loss of bowel control? N  Managing your Medications? N  Managing your Finances? N  Housekeeping or managing your Housekeeping? N    Patient Care Team: [name], DO as PCP - General (Family Medicine) Norman Clay, MD as Consulting Physician (Psychiatry) Gala Romney Cristopher Estimable, MD as Consulting Physician (Gastroenterology) Ceasar Mons, MD as Consulting Physician (Urology) Derek Jack, MD as Consulting Physician (Hematology)  Indicate any recent Medical Services you may have received from other than [facility] providers in the past year (date may be approximate).     Assessment:   This  is a routine wellness examination for [place].  Hearing/Vision screen Hearing Screening - Comments:: Denies hearing difficulties   Vision Screening -  Comments:: Wears rx glasses - up to date with routine eye exams with [facility] Dr. Debe Coder    Dietary issues and exercise activities discussed: Current Exercise Habits: The patient does not participate in regular exercise at present   Goals Addressed   None    Depression Screen    07/16/2022    4:16 PM 06/05/2022    1:15 PM 03/20/2022    2:30 PM 02/21/2022    3:27 PM 01/17/2022    3:43 PM 10/19/2021    1:56 PM 08/28/2021    2:03 PM  PHQ 2/9 Scores  PHQ - 2 Score 0 1 1 0 0  0  PHQ- 9 Score  3 6 0 0       Information is confidential and restricted. Go to Review Flowsheets to unlock data.    Fall Risk    07/16/2022    4:15 PM 06/05/2022    1:24 PM 06/05/2022    1:15 PM 03/20/2022    2:30 PM 02/21/2022    3:27 PM  Fall Risk   Falls in the past year? 1 1 0 0 0  Number falls in past yr: 1 1 0    Injury with Fall? 1 1 0    Risk for fall due to : History of fall(s) Impaired mobility;Impaired balance/gait No Fall Risks    Follow up Falls evaluation completed;Education provided;Falls prevention discussed Falls evaluation completed Education provided      FALL RISK PREVENTION PERTAINING TO THE HOME:  Any stairs in or around the home? No  If so, are there any without handrails? No  Home free of loose throw rugs in walkways, pet beds, electrical cords, etc? Yes  Adequate lighting in your home to reduce risk of falls? Yes   ASSISTIVE DEVICES UTILIZED TO PREVENT FALLS:  Life alert? No  Use of a cane, walker or w/c? No  Grab bars in the bathroom? Yes  Shower chair or bench in shower? No  Elevated toilet seat or a handicapped toilet? Yes   TIMED UP AND GO:  Was the test performed? No . Telephonic visit   Cognitive Function:    08/28/2021    2:03 PM 07/22/2018    4:16 PM  MMSE - Mini Mental State Exam  Not completed: Refused   Orientation to time 4 5  Orientation to Place 5 5  Registration 3 3  Attention/ Calculation 5 5  Recall 3 3  Language- name 2 objects 2 2  Language-  repeat 1 1  Language- follow 3 step command 3 3  Language- read & follow direction 1 1  Write a sentence 1 1  Copy design 1 1  Total score 29 30        07/16/2022    5:01 PM 07/10/2021    9:55 AM 07/23/2019    3:07 PM 07/23/2019    2:47 PM  6CIT Screen  What Year? 0 points 0 points 0 points 0 points  What month? 0 points 0 points 0 points 0 points  What time? 0 points 0 points 0 points   Count back from 20 0 points 0 points 0 points   Months in reverse 2 points 2 points 0 points   Repeat phrase 2 points 4 points 0 points   Total Score 4 points 6 points 0 points     Immunizations Immunization History  Administered Date(s) Administered   Covid-19, Mrna,Vaccine(Spikevax)59yr and older 05/14/2022   Influenza Inj Mdck Quad Pf 02/03/2018   Influenza,inj,Quad PF,6+ Mos 05/27/2019, 02/29/2020, 01/17/2022   Influenza-Unspecified 03/03/2021   Moderna Covid-19 Vaccine Bivalent Booster 146yr& up 02/21/2021   Moderna Sars-Covid-2 Vaccination 07/27/2019, 08/24/2019, 04/22/2020, 09/27/2020   Pneumococcal Conjugate-13 01/06/2016   Pneumococcal Polysaccharide-23 02/13/2018   Tdap 01/06/2016   Zoster Recombinat (Shingrix) 07/18/2021, 09/19/2021    TDAP status: Up to date  Flu Vaccine status: Up to date  Pneumococcal vaccine status: Up to date  Covid-19 vaccine status: Information provided on how to obtain vaccines.   Qualifies for Shingles Vaccine? Yes   Zostavax completed No   Shingrix Completed?: Yes  Screening Tests Health Maintenance  Topic Date Due   COVID-19 Vaccine (7 - 2023-24 season) 07/09/2022   MAMMOGRAM  02/22/2023 (Originally 09/29/2021)   PAP SMEAR-Modifier  09/30/2022   COLONOSCOPY (Pts [id number] coverage will need to be confirmed)  05/13/2023   Medicare Annual Wellness (AWV)  07/16/2023   DTaP/Tdap/Td (2 - Td or Tdap) 01/05/2026   INFLUENZA VACCINE  Completed   Hepatitis C Screening  Completed   HIV Screening  Completed   Zoster Vaccines- Shingrix   Completed   HPV VACCINES  Aged Out    Health Maintenance  Health Maintenance Due  Topic Date Due   COVID-19 Vaccine (7 - 2023-24 season) 07/09/2022    Colorectal cancer screening: Type of screening: Colonoscopy. Completed 1/6/205. Repeat every 5 years  Mammogram status:  Patient declines at this time   Lung Cancer Screening: (Low Dose CT Chest recommended if Age 4-102-80ars, 30 pack-year currently smoking OR have quit w/in 15years.) does not qualify.   Lung Cancer Screening Referral: n/a  Additional Screening:  Hepatitis C Screening: does qualify; Completed 04/16/18  Vision Screening: Recommended annual ophthalmology exams for early detection of glaucoma and other disorders of the eye. Is the patient up to date with their annual eye exam?  Yes  Who is the provider or what is the name of the office in which the patient attends annual eye exams? [facility] MadDebe Coderf pt is not established with a provider, would they like to be referred to a provider to establish care? No .   Dental Screening: Recommended annual dental exams for proper oral hygiene  Community Resource Referral / Chronic Care Management: CRR required this visit?  No   CCM required this visit?  No      Plan:     I have personally reviewed and noted the following in the patient's chart:   Medical and social history Use of alcohol, tobacco or illicit drugs  Current medications and supplements including opioid prescriptions. Patient is currently taking opioid prescriptions. Information provided to patient regarding non-opioid alternatives. Patient advised to discuss non-opioid treatment plan with their provider. Functional ability and status Nutritional status Physical activity Advanced directives List of other physicians Hospitalizations, surgeries, and ER visits in previous 12 months Vitals Screenings to include cognitive, depression, and falls Referrals and appointments  In addition, I have reviewed  and discussed with patient certain preventive protocols, quality metrics, and best practice recommendations. A written personalized care plan for preventive services as well as general preventive health recommendations were provided to patient.     SlaVanetta [id number] to this being a virtual visit, the after visit summary with patients personalized plan was offered to patient via mail or my-chart.  Patient would like to  access on my-chart  Nurse Notes: No concerns

## 2022-07-24 ENCOUNTER — Other Ambulatory Visit: Payer: Self-pay | Admitting: Family Medicine

## 2022-07-27 ENCOUNTER — Telehealth: Payer: Self-pay | Admitting: *Deleted

## 2022-07-27 NOTE — Telephone Encounter (Signed)
Fax from [place]  DME standard written order - Home Oxygen therapy continued need to be signed This was marked for COPD for PCP to signed Fax sent back w/ [facility] & note that we have no previous DME order for O2 or any previous signed O2 orders in our scanned media. Did this come from pulmonology?

## 2022-08-10 ENCOUNTER — Other Ambulatory Visit: Payer: Self-pay | Admitting: Family Medicine

## 2022-08-15 ENCOUNTER — Other Ambulatory Visit: Payer: Self-pay | Admitting: Family Medicine

## 2022-08-15 NOTE — Telephone Encounter (Signed)
[facility] calling patient is taking protonix from another doctor and dexilant and pepcid from Dr. Reece Agar is she suppose to be taking all 3 of the pharmacy asking. We did not have protonix on list. Please advise.

## 2022-08-15 NOTE — Telephone Encounter (Signed)
NO she should only be on dexilant.  Please make sure with the pt she is NOT taking the protonix

## 2022-08-20 ENCOUNTER — Ambulatory Visit: Payer: No Typology Code available for payment source | Admitting: Psychiatry

## 2022-08-28 ENCOUNTER — Other Ambulatory Visit: Payer: Self-pay | Admitting: Psychiatry

## 2022-09-06 ENCOUNTER — Other Ambulatory Visit: Payer: Self-pay | Admitting: Psychiatry

## 2022-09-12 ENCOUNTER — Telehealth: Payer: Self-pay | Admitting: Family Medicine

## 2022-09-12 NOTE — Telephone Encounter (Signed)
[facility] called requesting updated Oxygen Rx for patient.

## 2022-09-12 NOTE — Telephone Encounter (Signed)
Any way you might be willing to reach out to [facility] to see where the original rx came from and what they listed as indication?

## 2022-09-12 NOTE — Telephone Encounter (Signed)
The only DME order under Orders is for CPAP back in 2021 which was sent to [facility], there are no orders for Oxygen from [place].

## 2022-09-12 NOTE — Telephone Encounter (Signed)
Hey you noted: " Fax from [facility]  DME standard written order - Home Oxygen therapy continued need to be signed This was marked for COPD for PCP to signed Fax sent back w/ [facility] & note that we have no previous DME order for O2 or any previous signed O2 orders in our scanned media. Did this come from pulmonology?    Back in march. Not sure where this order has come from.  We apparently are getting another request.  Were you able to figure that out?

## 2022-09-13 ENCOUNTER — Other Ambulatory Visit: Payer: Self-pay | Admitting: Psychiatry

## 2022-09-13 NOTE — Telephone Encounter (Signed)
TC to [facility] to let them know that we did not originally order her O2 we have only ordered her CPAP. They will reach out to patient to see who she got her oxygen from.

## 2022-09-17 NOTE — Telephone Encounter (Signed)
received fax requesting a refill on the vraylar 6mg 

## 2022-09-28 ENCOUNTER — Other Ambulatory Visit: Payer: Self-pay | Admitting: Family Medicine

## 2022-09-29 NOTE — Progress Notes (Unsigned)
Virtual Visit via Video Note  I connected with Rebecca Moreno on 10/02/22 at  9:30 AM EDT by a video enabled telemedicine application and verified that I am speaking with the correct person using two identifiers.  Location: Patient: home Provider: office Persons participated in the visit- patient, provider    I discussed the limitations of evaluation and management by telemedicine and the availability of in person appointments. The patient expressed understanding and agreed to proceed.   I discussed the assessment and treatment plan with the patient. The patient was provided an opportunity to ask questions and all were answered. The patient agreed with the plan and demonstrated an understanding of the instructions.   The patient was advised to call back or seek an in-person evaluation if the symptoms worsen or if the condition fails to improve as anticipated.  I provided 13 minutes of non-face-to-face time during this encounter.   Neysa Hotter, MD    [facility] MD/PA/NP OP Progress Note  10/02/2022 10:11 AM Rebecca Moreno  MRN:  [id number]  Chief Complaint:  Chief Complaint  Patient presents with   Follow-up   HPI:  - she is not seen since Nov 2023 This is a follow-up appointment for depression, anxiety.  She states that she has been the same.  She states that she did not have a follow-up as she was doing well.  She expressed understanding to have a regular visit to safely maintain on her medication.  She states that her mother-in-law had a fracture in her back, and she has been bedridden.  She has been taking care of her while doing other errands.  She reports fair relationship with her husband, who she describes as a friend.  She denies feeling depressed.  She tends to feel anxious in the evening.  She sleeps 8 hours.  She denies SI.  She denies hallucinations.  She denies decreased need for sleep or euphoria.  She denies using alcohol or drug.  She has not noticed any change since  lowering the dose of quetiapine, and is willing to do further tapering down.  She does not think she can reduce the dose of Klonopin at this time.    Wt Readings from Last 3 Encounters:  07/16/22 197 lb (89.4 kg)  06/05/22 197 lb (89.4 kg)  05/10/22 196 lb 9.6 oz (89.2 kg)    Visit Diagnosis:    ICD-10-CM   1. MDD (major depressive disorder), recurrent, in partial remission (HCC)  F33.41     2. Anxiety state  F41.1       Past Psychiatric History: Please see initial evaluation for full details. I have reviewed the history. No updates at this time.     Past Medical History:  Past Medical History:  Diagnosis Date   Acute pyelonephritis    Asthma    Bipolar disorder (HCC)    Colon polyp    Complication of anesthesia    "sometimes hard to put to sleep"   Depression    Bipolar   Gastric ulcer    around 2013.  stress related   GERD (gastroesophageal reflux disease)    History of kidney stones    Hyperlipidemia    Hypothyroid    Polyp, uterus corpus    Sepsis due to Escherichia coli (E. coli) (HCC) 09/04/2018   Sleep apnea    no cpap used   Stroke [facility])     Past Surgical History:  Procedure Laterality Date   ABLATION ON ENDOMETRIOSIS  COLONOSCOPY     Per patient, done around 2013 in [place], had polyp and overdue for follow up.   COLONOSCOPY WITH PROPOFOL N/A 05/12/2018   Procedure: COLONOSCOPY WITH PROPOFOL;  Surgeon: Corbin Ade, MD;  Location: [facility];  Service: Endoscopy;  Laterality: N/A;  12:00pm   CYSTOSCOPY W/ URETERAL STENT PLACEMENT Left 09/01/2018   Procedure: CYSTOSCOPY WITH RETROGRADE PYELOGRAM/URETERAL STENT PLACEMENT;  Surgeon: Rene Paci, MD;  Location: [facility] OR[facility];  Service: Urology;  Laterality: Left;   CYSTOSCOPY/URETEROSCOPY/HOLMIUM LASER/STENT PLACEMENT Left 09/17/2018   Procedure: CYSTOSCOPY/URETEROSCOPY/HOLMIUM LASER/STENT PLACEMENT;  Surgeon: Rene Paci, MD;  Location: [facility];  Service: Urology;  Laterality: Left;   ONLY NEEDS 45 MIN   OVARIAN CYST SURGERY Left    POLYPECTOMY  05/12/2018   Procedure: POLYPECTOMY;  Surgeon: Corbin Ade, MD;  Location: [facility];  Service: Endoscopy;;  polyp at splenic flexure   TEMPOROMANDIBULAR JOINT SURGERY     9 surgeries   TUBAL LIGATION     vaginal childbirth     1    Family Psychiatric History: Please see initial evaluation for full details. I have reviewed the history. No updates at this time.     Family History:  Family History  Problem Relation Age of Onset   Anxiety disorder Mother    Hypertension Mother    Heart failure Mother    Stroke Mother    Hyperlipidemia Father    Diabetes Father    Stroke Father    Hypertension Father    Colon polyps Father        older than 61   Diabetes Sister    Hypertension Sister    Ovarian cancer Sister    Ovarian cancer Maternal Grandmother    Colon cancer Maternal Grandmother    Renal Disease Paternal Grandmother    Heart attack Paternal Grandfather    Asthma Daughter    Bipolar disorder Other    Asthma Niece     Social History:  Social History   Socioeconomic History   Marital status: Married    Spouse name: Not on file   Number of children: 1   Years of education: Not on file   Highest education level: High school graduate  Occupational History   Occupation: disability  Tobacco Use   Smoking status: Every Day    Packs/day: 0.50    Years: 30.00    Additional pack years: 0.00    Total pack years: 15.00    Types: Cigarettes   Smokeless tobacco: Never   Tobacco comments:    Is smoking about 0.5 of a pack and has a patch placed on   Vaping Use   Vaping Use: Never used  Substance and Sexual Activity   Alcohol use: Never   Drug use: Not Currently   Sexual activity: Yes    Partners: Male    Birth control/protection: Surgical    Comment: tubal  Other Topics Concern   Not on file  Social History Narrative   Not on file   Social Determinants of Health   Financial Resource Strain:  Low Risk  (07/16/2022)   Overall Financial Resource Strain (CARDIA)    Difficulty of Paying Living Expenses: Not very hard  Food Insecurity: Food Insecurity Present (07/16/2022)   Hunger Vital Sign    Worried About Running Out of Food in the Last Year: Sometimes true    Ran Out of Food in the Last Year: Sometimes true  Transportation Needs: No Transportation Needs (07/16/2022)  PRAPARE - Administrator, Civil Service (Medical): No    Lack of Transportation (Non-Medical): No  Physical Activity: Inactive (07/16/2022)   Exercise Vital Sign    Days of Exercise per Week: 0 days    Minutes of Exercise per Session: 0 min  Stress: No Stress Concern Present (07/16/2022)   [employer] of Occupational Health - Occupational Stress Questionnaire    Feeling of Stress : Not at all  Social Connections: Moderately Isolated (07/16/2022)   Social Connection and Isolation Panel [NHANES]    Frequency of Communication with Friends and Family: More than three times a week    Frequency of Social Gatherings with Friends and Family: More than three times a week    Attends Religious Services: Never    Database administrator or Organizations: No    Attends Banker Meetings: Never    Marital Status: Married    Allergies:  Allergies  Allergen Reactions   Lithium Other (See Comments)    Psoriasis     Metabolic Disorder Labs: Lab Results  Component Value Date   HGBA1C 5.2 02/20/2021   No results found for: "PROLACTIN" Lab Results  Component Value Date   CHOL 212 (H) 12/18/2018   TRIG 171 (H) 12/18/2018   HDL 44 12/18/2018   CHOLHDL 4.8 (H) 12/18/2018   LDLCALC 134 (H) 12/18/2018   LDLCALC 168 (H) 01/15/2018   Lab Results  Component Value Date   TSH 1.100 09/04/2021   TSH 0.772 02/20/2021    Therapeutic Level Labs: No results found for: "LITHIUM" No results found for: "VALPROATE" No results found for: "CBMZ"  Current Medications: Current Outpatient Medications   Medication Sig Dispense Refill   albuterol (VENTOLIN HFA) 108 (90 Base) MCG/ACT inhaler Inhale 1 puff into the lungs every 6 (six) hours as needed for wheezing or shortness of breath. 6.7 g 1   atorvastatin (LIPITOR) 80 MG tablet TAKE ONE TABLET AT BEDTIME 100 tablet 0   CALCIUM CITRATE-VITAMIN D3 PO Take 1 tablet by mouth daily.     Cariprazine HCl (VRAYLAR) 6 MG CAPS Take 1 capsule (6 mg total) by mouth daily. 30 capsule 0   Cholecalciferol (VITAMIN D3) 5000 units TABS Take 5,000 Units by mouth daily.      clonazePAM (KLONOPIN) 1 MG tablet Take 1 tablet (1 mg total) by mouth 2 (two) times daily. 60 tablet 2   Dexlansoprazole (DEXILANT) 30 MG capsule DR TAKE (1) CAPSULE DAILY 90 capsule 3   famotidine (PEPCID) 20 MG tablet TAKE ONE TABLET BY MOUTH EVERY DAY 90 tablet 0   FLOVENT HFA 220 MCG/ACT inhaler INHALE 2 PUFFS TWICE DAILY 12 g 0   fluticasone (FLONASE) 50 MCG/ACT nasal spray SPRAY 2 SPRAYS INTO EACH NOSTRIL EVERY DAY 48 mL 0   gabapentin (NEURONTIN) 300 MG capsule Take 1 capsule at supper and 1 capsule at bedtime.  Further fills per Chriss Czar, Neurology 180 capsule 0   lamoTRIgine (LAMICTAL) 100 MG tablet Take 3 tablets (300 mg total) by mouth at bedtime. 270 tablet 1   levothyroxine (SYNTHROID) 50 MCG tablet TAKE ONE TABLET DAILY 90 tablet 0   loratadine (CLARITIN) 10 MG tablet TAKE 1 TABLET DAILY 30 tablet 5   magnesium gluconate (MAGONATE) 500 MG tablet Take 500 mg by mouth daily.     MODERNA COVID-19 BIVAL BOOSTER 50 MCG/0.5ML injection      Multiple Vitamins-Minerals (MULTIVITAMIN WOMEN PO) Take 1 tablet by mouth daily.     QUEtiapine (SEROQUEL) 100  MG tablet Take 1.5 tablets (150 mg total) by mouth at bedtime. 135 tablet 0   tiZANidine (ZANAFLEX) 4 MG tablet Take 0.5-1 tablets (2-4 mg total) by mouth every 8 (eight) hours as needed for muscle spasms. 60 tablet 0   varenicline (CHANTIX) 1 MG tablet Take 1 mg by mouth.     venlafaxine XR (EFFEXOR-XR) 150 MG 24 hr capsule Take 2  capsules (300 mg total) by mouth daily. 180 capsule 1   No current facility-administered medications for this visit.     Musculoskeletal: Strength & Muscle Tone:  N/A Gait & Station:  N/A Patient leans: N/A  Psychiatric Specialty Exam: Review of Systems  Psychiatric/Behavioral:  Negative for agitation, behavioral problems, confusion, decreased concentration, dysphoric mood, hallucinations, self-injury, sleep disturbance and suicidal ideas. The patient is nervous/anxious. The patient is not hyperactive.   All other systems reviewed and are negative.   Last menstrual period 12/13/2017.There is no height or weight on file to calculate BMI.  General Appearance: Fairly Groomed  Eye Contact:  Good  Speech:  Clear and Coherent  Volume:  Normal  Mood:   good  Affect:  Appropriate, Congruent, and calm  Thought Process:  Coherent  Orientation:  Full (Time, Place, and Person)  Thought Content: Logical   Suicidal Thoughts:  No  Homicidal Thoughts:  No  Memory:  Immediate;   Good  Judgement:  Good  Insight:  Good  Psychomotor Activity:  Normal  Concentration:  Concentration: Good and Attention Span: Good  Recall:  Good  Fund of Knowledge: Good  Language: Good  Akathisia:  No  Handed:  Right  AIMS (if indicated): not done  Assets:  Communication Skills Desire for Improvement  ADL's:  Intact  Cognition: WNL  Sleep:  Good   Screenings: GAD-7    Flowsheet Row Office Visit from 06/05/2022 in [facility] Office Visit from 03/20/2022 in [facility] Office Visit from 02/21/2022 in [facility] Office Visit from 01/17/2022 in [facility] Office Visit from 08/28/2021 in [facility]  Total GAD-7 Score 10 13 0 0 0      Mini-Mental    Flowsheet Row Office Visit from 08/28/2021 in [facility] Clinical Support from 07/22/2018 in [facility]  Total Score (max 30 points ) 29 30      PHQ2-9    Flowsheet Row Clinical Support from 07/16/2022 in [facility] Office Visit from 06/05/2022 in [facility] Office Visit from 03/20/2022 in [facility] Office Visit from 02/21/2022 in [facility] Office Visit from 01/17/2022 in [facility]  PHQ-2 Total Score 0 1 1 0 0  PHQ-9 Total Score -- 3 6 0 0      Flowsheet Row Office Visit from 10/19/2021 in [facility] Video Visit from 11/15/2020 in [facility] Video Visit from 08/18/2020 in [facility]  C-SSRS RISK CATEGORY No Risk No Risk No Risk        Assessment and Plan:  Rebecca Moreno is a 57 y.o. year old female with a history of  depression, hypothyroidism, obstructive sleep apnea (CPAP machine), stroke, Hyperlipidemia, Hypertension, stroke, who presents for follow up appointment for below.   1.  MDD (major depressive disorder), recurrent, in partial remission (HCC) 2. Anxiety state # Bipolar I disorder by history Acute stressors include:  Other stressors include: marital conflict, caregiver of her mother in law    History: tx from [place]. Dx bipolar I disorder, admitted four times for being delusional, last in 2007. No SA. Never experienced mania. history of paranoia and irritability, VH in the context of depression. Originally on venlafaxine 300 mg daily, lamotrigine 300 mg daily, Vraylar 6 mg, quetiapine 300 mg at night, clonazepam 1 mg BID    She denies any significant mood symptoms since lowering the dose of quetiapine.  Will do further tapering down to avoid polypharmacy.  Will continue venlafaxine, lamotrigine,  Vraylar to target depression.  Will continue clonazepam as needed for anxiety.  Noted that she has been on both quetiapine and Vraylar when she was first seen by this provider under the diagnosis of bipolar I disorder. Both the patient and her husband reports history of paranoia and irritability, visual hallucination in the context of depression in the past, and it is unclear whether she had manic symptoms otherwise. Unable to obtain record from her previous psychiatrist despite the attempt.    # benzodiazepine dependence She has been on the current dose of clonazepam since the initial visit with this writer without obvious concern of overusing this medication.  She declined to taper down this medication.  Will prioritize tapering off quetiapine first, and will consider slow tapering down of this medication moving forward to avoid long-term risk of tolerance, dependency.   Plan   Continue venlafaxine 300 mg daily Continue Lamotrigine 300 mg daily Continue Vraylar 6 mg daily Decrease quetiapine 100 mg at night (from 150 mg at night) Obtain EKG at PCP- messages was sent Continue clonazepam 1 mg twice a day as needed for anxiety  Next appointment: 8/1 at 3 pm, in person. She will continue to see this Clinical research associate, although transfer to [facility] was attempted in the past.  Obtain lab - lipid    Past trials of medication: sertraline, fluoxetine, lexapro, Celexa, duloxetine, Wellbutrin  lithium (psoriasis), Depakote (did not work), olanzapine, Abilify (weight gain), latuda, Geodon    The patient demonstrates the following risk factors for suicide: Chronic risk factors for suicide include: psychiatric disorder of depression. Acute risk factors for suicide include: family or marital conflict and unemployment. Protective factors for this patient include: positive social support and hope for the future. Considering these factors, the overall suicide risk at this point appears to be low. Patient is appropriate for  outpatient follow up.  Collaboration of Care: Collaboration of Care: Other reviewed notes in Epic  Patient/Guardian was advised Release of Information must be obtained prior to any record release in order to collaborate their care with an outside provider. Patient/Guardian was advised if they have not already done so to contact the registration department to sign all necessary forms in order for [place] to release information regarding their care.   Consent: Patient/Guardian gives verbal consent for treatment and assignment of benefits for services provided during this visit. Patient/Guardian expressed understanding and agreed to proceed.    Neysa Hotter, MD 10/02/2022, 10:11 AM

## 2022-10-02 ENCOUNTER — Telehealth (INDEPENDENT_AMBULATORY_CARE_PROVIDER_SITE_OTHER): Payer: 59 | Admitting: Psychiatry

## 2022-10-02 ENCOUNTER — Encounter: Payer: Self-pay | Admitting: Psychiatry

## 2022-10-02 DIAGNOSIS — F411 Generalized anxiety disorder: Secondary | ICD-10-CM

## 2022-10-02 DIAGNOSIS — F3341 Major depressive disorder, recurrent, in partial remission: Secondary | ICD-10-CM | POA: Diagnosis not present

## 2022-10-04 ENCOUNTER — Telehealth: Payer: No Typology Code available for payment source | Admitting: Psychiatry

## 2022-10-05 ENCOUNTER — Ambulatory Visit: Payer: Medicaid Other | Admitting: Family Medicine

## 2022-10-08 ENCOUNTER — Encounter: Payer: Self-pay | Admitting: Family Medicine

## 2022-10-11 ENCOUNTER — Ambulatory Visit: Payer: No Typology Code available for payment source | Admitting: Psychiatry

## 2022-10-11 DIAGNOSIS — J449 Chronic obstructive pulmonary disease, unspecified: Secondary | ICD-10-CM | POA: Diagnosis not present

## 2022-10-11 DIAGNOSIS — G4733 Obstructive sleep apnea (adult) (pediatric): Secondary | ICD-10-CM | POA: Diagnosis not present

## 2022-10-11 DIAGNOSIS — R0902 Hypoxemia: Secondary | ICD-10-CM | POA: Diagnosis not present

## 2022-10-15 ENCOUNTER — Ambulatory Visit: Payer: Medicaid Other | Admitting: Family Medicine

## 2022-10-16 ENCOUNTER — Other Ambulatory Visit: Payer: Self-pay | Admitting: Psychiatry

## 2022-10-23 DIAGNOSIS — K219 Gastro-esophageal reflux disease without esophagitis: Secondary | ICD-10-CM | POA: Diagnosis not present

## 2022-10-23 DIAGNOSIS — J370 Chronic laryngitis: Secondary | ICD-10-CM | POA: Diagnosis not present

## 2022-10-23 DIAGNOSIS — R490 Dysphonia: Secondary | ICD-10-CM | POA: Diagnosis not present

## 2022-10-25 ENCOUNTER — Telehealth: Payer: Self-pay

## 2022-10-25 ENCOUNTER — Other Ambulatory Visit: Payer: Self-pay | Admitting: Psychiatry

## 2022-10-25 MED ORDER — VRAYLAR 6 MG PO CAPS: 6.0000 mg | ORAL_CAPSULE | Freq: Every day | ORAL | 0 refills | Status: AC

## 2022-10-25 NOTE — Telephone Encounter (Signed)
Ordered

## 2022-10-25 NOTE — Telephone Encounter (Signed)
pt called states she needs refills on the vraylar 6mg . pt last seen on 5-28 next appt 8-1. (i don't see this medication on her medication list but you have it on your last note to continue )

## 2022-10-25 NOTE — Telephone Encounter (Signed)
Pt.notified

## 2022-10-27 ENCOUNTER — Other Ambulatory Visit: Payer: Self-pay | Admitting: Family Medicine

## 2022-10-27 ENCOUNTER — Other Ambulatory Visit: Payer: Self-pay | Admitting: Allergy & Immunology

## 2022-10-27 DIAGNOSIS — J301 Allergic rhinitis due to pollen: Secondary | ICD-10-CM

## 2022-10-29 ENCOUNTER — Encounter: Payer: Self-pay | Admitting: Family Medicine

## 2022-10-29 ENCOUNTER — Ambulatory Visit (INDEPENDENT_AMBULATORY_CARE_PROVIDER_SITE_OTHER): Payer: 59 | Admitting: Family Medicine

## 2022-10-29 VITALS — BP 132/81 | HR 87 | Temp 97.4°F | Resp 20 | Ht 66.0 in | Wt 217.1 lb

## 2022-10-29 DIAGNOSIS — Z5181 Encounter for therapeutic drug level monitoring: Secondary | ICD-10-CM

## 2022-10-29 DIAGNOSIS — E039 Hypothyroidism, unspecified: Secondary | ICD-10-CM | POA: Diagnosis not present

## 2022-10-29 DIAGNOSIS — N1831 Chronic kidney disease, stage 3a: Secondary | ICD-10-CM | POA: Diagnosis not present

## 2022-10-29 DIAGNOSIS — J040 Acute laryngitis: Secondary | ICD-10-CM

## 2022-10-29 DIAGNOSIS — R7309 Other abnormal glucose: Secondary | ICD-10-CM | POA: Diagnosis not present

## 2022-10-29 DIAGNOSIS — L409 Psoriasis, unspecified: Secondary | ICD-10-CM

## 2022-10-29 LAB — BAYER DCA HB A1C WAIVED: HB A1C (BAYER DCA - WAIVED): 6.2 % — ABNORMAL HIGH (ref 4.8–5.6)

## 2022-10-29 MED ORDER — BETAMETHASONE VALERATE 0.1 % EX OINT
1.0000 | TOPICAL_OINTMENT | Freq: Two times a day (BID) | CUTANEOUS | 1 refills | Status: DC
Start: 2022-10-29 — End: 2023-01-04

## 2022-10-29 NOTE — Progress Notes (Signed)
Subjective: CC: Psoriasis PCP: Raliegh Ip, DO ZOX:WRUEA H Rebecca Moreno is a 57 y.o. female presenting to clinic today for:  1.  Psoriasis Patient reports that she has had a 1 week flareup of bilateral hand and feet psoriasis.  She utilizes CeraVe regularly to maintain skin.  She had some leftover triamcinolone but did not use this.  Reports itching and burning  2.  Mood disorder Patient was scheduled for July but mistakenly came in today and we are accommodating her visit.  She needs to have labs and EKG done for her psychiatrist for medication monitoring purposes.   ROS: Per HPI  Allergies  Allergen Reactions   Lithium Other (See Comments)    Psoriasis    Past Medical History:  Diagnosis Date   Acute pyelonephritis    Asthma    Bipolar disorder (HCC)    Colon polyp    Complication of anesthesia    "sometimes hard to put to sleep"   Depression    Bipolar   Gastric ulcer    around 2013.  stress related   GERD (gastroesophageal reflux disease)    History of kidney stones    Hyperlipidemia    Hypothyroid    Polyp, uterus corpus    Sepsis due to Escherichia coli (E. coli) (HCC) 09/04/2018   Sleep apnea    no cpap used   Stroke [facility])     Current Outpatient Medications:    albuterol (VENTOLIN HFA) 108 (90 Base) MCG/ACT inhaler, Inhale 1 puff into the lungs every 6 (six) hours as needed for wheezing or shortness of breath., Disp: 6.7 g, Rfl: 1   atorvastatin (LIPITOR) 80 MG tablet, TAKE ONE TABLET AT BEDTIME, Disp: 100 tablet, Rfl: 0   CALCIUM CITRATE-VITAMIN D3 PO, Take 1 tablet by mouth daily., Disp: , Rfl:    Cariprazine HCl (VRAYLAR) 6 MG CAPS, Take 1 capsule (6 mg total) by mouth daily., Disp: 90 capsule, Rfl: 0   Cholecalciferol (VITAMIN D3) 5000 units TABS, Take 5,000 Units by mouth daily. , Disp: , Rfl:    clonazePAM (KLONOPIN) 1 MG tablet, Take 1 tablet (1 mg total) by mouth 2 (two) times daily., Disp: 60 tablet, Rfl: 2   Dexlansoprazole (DEXILANT) 30 MG  capsule DR, TAKE (1) CAPSULE DAILY, Disp: 90 capsule, Rfl: 3   famotidine (PEPCID) 20 MG tablet, TAKE ONE TABLET BY MOUTH EVERY DAY, Disp: 90 tablet, Rfl: 0   FLOVENT HFA 220 MCG/ACT inhaler, INHALE 2 PUFFS TWICE DAILY, Disp: 12 g, Rfl: 0   fluticasone (FLONASE) 50 MCG/ACT nasal spray, SPRAY ONE SPRAY IN EACH NOSTRIL DAILY AT night TO help with morning congestion, Disp: 16 g, Rfl: 5   gabapentin (NEURONTIN) 300 MG capsule, Take 1 capsule at supper and 1 capsule at bedtime.  Further fills per Chriss Czar, Neurology, Disp: 180 capsule, Rfl: 0   lamoTRIgine (LAMICTAL) 100 MG tablet, Take 3 tablets (300 mg total) by mouth at bedtime., Disp: 270 tablet, Rfl: 1   levothyroxine (SYNTHROID) 50 MCG tablet, TAKE ONE TABLET DAILY, Disp: 90 tablet, Rfl: 0   loratadine (CLARITIN) 10 MG tablet, TAKE 1 TABLET DAILY, Disp: 30 tablet, Rfl: 5   magnesium gluconate (MAGONATE) 500 MG tablet, Take 500 mg by mouth daily., Disp: , Rfl:    Multiple Vitamins-Minerals (MULTIVITAMIN WOMEN PO), Take 1 tablet by mouth daily., Disp: , Rfl:    QUEtiapine (SEROQUEL) 100 MG tablet, Take 1.5 tablets (150 mg total) by mouth at bedtime., Disp: 135 tablet, Rfl: 0  tiZANidine (ZANAFLEX) 4 MG tablet, Take 0.5-1 tablets (2-4 mg total) by mouth every 8 (eight) hours as needed for muscle spasms., Disp: 60 tablet, Rfl: 0   venlafaxine XR (EFFEXOR-XR) 150 MG 24 hr capsule, Take 2 capsules (300 mg total) by mouth daily., Disp: 180 capsule, Rfl: 1   MODERNA COVID-19 BIVAL BOOSTER 50 MCG/0.5ML injection, , Disp: , Rfl:  Social History   Socioeconomic History   Marital status: Married    Spouse name: Not on file   Number of children: 1   Years of education: Not on file   Highest education level: High school graduate  Occupational History   Occupation: disability  Tobacco Use   Smoking status: Every Day    Packs/day: 0.50    Years: 30.00    Additional pack years: 0.00    Total pack years: 15.00    Types: Cigarettes   Smokeless  tobacco: Never   Tobacco comments:    Is smoking about 0.5 of a pack and has a patch placed on   Vaping Use   Vaping Use: Never used  Substance and Sexual Activity   Alcohol use: Never   Drug use: Not Currently   Sexual activity: Yes    Partners: Male    Birth control/protection: Surgical    Comment: tubal  Other Topics Concern   Not on file  Social History Narrative   Not on file   Social Determinants of Health   Financial Resource Strain: Low Risk  (07/16/2022)   Overall Financial Resource Strain (CARDIA)    Difficulty of Paying Living Expenses: Not very hard  Food Insecurity: Food Insecurity Present (07/16/2022)   Hunger Vital Sign    Worried About Running Out of Food in the Last Year: Sometimes true    Ran Out of Food in the Last Year: Sometimes true  Transportation Needs: No Transportation Needs (07/16/2022)   PRAPARE - Administrator, Civil Service (Medical): No    Lack of Transportation (Non-Medical): No  Physical Activity: Inactive (07/16/2022)   Exercise Vital Sign    Days of Exercise per Week: 0 days    Minutes of Exercise per Session: 0 min  Stress: No Stress Concern Present (07/16/2022)   [employer] of Occupational Health - Occupational Stress Questionnaire    Feeling of Stress : Not at all  Social Connections: Moderately Isolated (07/16/2022)   Social Connection and Isolation Panel [NHANES]    Frequency of Communication with Friends and Family: More than three times a week    Frequency of Social Gatherings with Friends and Family: More than three times a week    Attends Religious Services: Never    Database administrator or Organizations: No    Attends Banker Meetings: Never    Marital Status: Married  Catering manager Violence: Not At Risk (07/16/2022)   Humiliation, Afraid, Rape, and Kick questionnaire    Fear of Current or Ex-Partner: No    Emotionally Abused: No    Physically Abused: No    Sexually Abused: No   Family  History  Problem Relation Age of Onset   Anxiety disorder Mother    Hypertension Mother    Heart failure Mother    Stroke Mother    Hyperlipidemia Father    Diabetes Father    Stroke Father    Hypertension Father    Colon polyps Father        older than 15   Diabetes Sister    Hypertension Sister  Ovarian cancer Sister    Ovarian cancer Maternal Grandmother    Colon cancer Maternal Grandmother    Renal Disease Paternal Grandmother    Heart attack Paternal Grandfather    Asthma Daughter    Bipolar disorder Other    Asthma Niece     Objective: Office vital signs reviewed. BP 132/81   Pulse 87   Temp (!) 97.4 F (36.3 C) (Oral)   Resp 20   Ht 5\' 6"  (1.676 m)   Wt 217 lb 1 oz (98.5 kg)   LMP 12/13/2017 (Approximate)   SpO2 95%   BMI 35.03 kg/m   Physical Examination:  General: Awake, alert, morbidly obese, No acute distress HEENT: Sclera white.  No exophthalmos Cardio: regular rate and rhythm, S1S2 heard, no murmurs appreciated Pulm: clear to auscultation bilaterally, no wheezes, rhonchi or rales; normal work of breathing on room air Skin: Psoriatic plaques on bilateral hands, feet and in the interdigital spaces.  No active bleeding or evidence of secondary bacterial infection  Assessment/ Plan: 57 y.o. female   Psoriasis - Plan: CMP14+EGFR, CBC, betamethasone valerate ointment (VALISONE) 0.1 %  Medication monitoring encounter - Plan: EKG 12-Lead, CMP14+EGFR, CBC, Bayer DCA Hb A1c Waived  Acquired hypothyroidism - Plan: LDL Cholesterol, Direct, TSH, T4, Free  Stage 3a chronic kidney disease (HCC) - Plan: CMP14+EGFR, CBC  Laryngitis - Plan: CBC  Betamethasone applied to bilateral hands and feet twice daily as needed psoriasis or rash.  We discussed that we can refer to dermatology for consideration of immunologic's at some point if needed.  Check CMP, CBC and EKG (no QTc prolongation) for her psychiatrist.  Direct LDL, thyroid levels and A1c also collected.   Will CC results to her once available.  Check renal function given CKD  Has an appointment being made for her ongoing laryngitis to rule out malignancy.  Awaiting for that visit at [facility]  No orders of the defined types were placed in this encounter.  No orders of the defined types were placed in this encounter.    Raliegh Ip, DO [facility] [phone]

## 2022-10-30 LAB — CMP14+EGFR
ALT: 53 IU/L — ABNORMAL HIGH (ref 0–32)
AST: 37 IU/L (ref 0–40)
Albumin: 4.3 g/dL (ref 3.8–4.9)
Alkaline Phosphatase: 123 IU/L — ABNORMAL HIGH (ref 44–121)
BUN/Creatinine Ratio: 11 (ref 9–23)
BUN: 11 mg/dL (ref 6–24)
Bilirubin Total: 0.2 mg/dL (ref 0.0–1.2)
CO2: 30 mmol/L — ABNORMAL HIGH (ref 20–29)
Calcium: 9.4 mg/dL (ref 8.7–10.2)
Chloride: 101 mmol/L (ref 96–106)
Creatinine, Ser: 1.03 mg/dL — ABNORMAL HIGH (ref 0.57–1.00)
Globulin, Total: 2 g/dL (ref 1.5–4.5)
Glucose: 91 mg/dL (ref 70–99)
Potassium: 4.5 mmol/L (ref 3.5–5.2)
Sodium: 142 mmol/L (ref 134–144)
Total Protein: 6.3 g/dL (ref 6.0–8.5)
eGFR: 63 mL/min/{1.73_m2} (ref >59)

## 2022-10-30 LAB — CBC
Hematocrit: 41.8 % (ref 34.0–46.6)
Hemoglobin: 14.3 g/dL (ref 11.1–15.9)
MCH: 34 pg — ABNORMAL HIGH (ref 26.6–33.0)
MCHC: 34.2 g/dL (ref 31.5–35.7)
MCV: 99 fL — ABNORMAL HIGH (ref 79–97)
Platelets: 205 10*3/uL (ref 150–450)
RBC: 4.21 x10E6/uL (ref 3.77–5.28)
RDW: 12.4 % (ref 11.7–15.4)
WBC: 7 10*3/uL (ref 3.4–10.8)

## 2022-10-30 LAB — T4, FREE: Free T4: 1.06 ng/dL (ref 0.82–1.77)

## 2022-10-30 LAB — TSH: TSH: 0.939 u[IU]/mL (ref 0.450–4.500)

## 2022-10-30 LAB — LDL CHOLESTEROL, DIRECT: LDL Direct: 72 mg/dL (ref 0–99)

## 2022-11-06 ENCOUNTER — Other Ambulatory Visit: Payer: Self-pay | Admitting: Psychiatry

## 2022-11-10 DIAGNOSIS — J449 Chronic obstructive pulmonary disease, unspecified: Secondary | ICD-10-CM | POA: Diagnosis not present

## 2022-11-10 DIAGNOSIS — R0902 Hypoxemia: Secondary | ICD-10-CM | POA: Diagnosis not present

## 2022-11-10 DIAGNOSIS — G4733 Obstructive sleep apnea (adult) (pediatric): Secondary | ICD-10-CM | POA: Diagnosis not present

## 2022-11-12 ENCOUNTER — Ambulatory Visit: Payer: No Typology Code available for payment source | Admitting: Psychiatry

## 2022-11-14 ENCOUNTER — Other Ambulatory Visit: Payer: Self-pay | Admitting: Psychiatry

## 2022-11-14 ENCOUNTER — Telehealth: Payer: Self-pay

## 2022-11-14 MED ORDER — LAMOTRIGINE 100 MG PO TABS: 300.0000 mg | ORAL_TABLET | Freq: Every day | ORAL | 1 refills | Status: DC

## 2022-11-14 MED ORDER — VENLAFAXINE HCL ER 150 MG PO CP24: 300.0000 mg | ORAL_CAPSULE | Freq: Every day | ORAL | 1 refills | Status: DC

## 2022-11-14 NOTE — Telephone Encounter (Signed)
received fax requesting a refill on the quetiapin. 10-02-22 next appt 12-06-22

## 2022-11-14 NOTE — Telephone Encounter (Signed)
Could you ask the patient if she needs this refill? We are trying to taper down this medication, and she may have enough meds until her next visit.

## 2022-11-14 NOTE — Telephone Encounter (Signed)
left message that rxs' have been sent to the pharmacy

## 2022-11-14 NOTE — Telephone Encounter (Signed)
received fax requesting a refill on the venlafaxine and the lamotrigine. pt was last seen on 5-28 next appt 8-1

## 2022-11-15 NOTE — Telephone Encounter (Signed)
pt states she has enough medication and to disregard the request

## 2022-11-28 ENCOUNTER — Ambulatory Visit: Payer: Medicaid Other | Admitting: Family Medicine

## 2022-11-30 NOTE — Progress Notes (Unsigned)
[facility] [address], [place] [address]   CLINIC:  Medical Oncology/Hematology  PCP:  Raliegh Ip, DO [address] 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 female returns for routine follow-up of secondary polycythemia in the setting of (prior) tobacco use and sleep apnea.  She was last evaluated via telephone visit by Rojelio Brenner PA-C on 06/01/2022.  At today's visit, she reports feeling fairly well apart from ongoing fatigue..  No recent hospitalizations, surgeries, or changes in baseline health status.  She quit smoking on April 30, 2022, and "has not touched a cigarette since then."  She is very happy with her decision.   She remains compliant with CPAP.  She denies any new CVA, MI, or blood clots since her last visit.  No aquagenic pruritus, Raynaud's, erythromelalgia, or other vasomotor symptoms.  No B symptoms.   She has little to no energy and 100% appetite. She endorses that she is maintaining a stable weight.  ASSESSMENT & PLAN:  1.  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 - JAK2 V617F and reflex mutation testing was negative. - Erythropoietin was 15.9. White count and platelet count was normal. - History of chronic headaches. - Quit smoking on 04/30/2022 - She also has sleep apnea and reports good use of CPAP - CT renal study on 09/01/2018 showed mild left hydroureteronephrosis due to 4 mm mid left ureteral calculus. Tiny left renal calculus. Severe hepatic steatosis. - Carboxyhemoglobin significantly elevated at 18.4 (01/06/2019), but significantly improved after smoking cessation.  She denies any  other source of obvious carbon monoxide exposure. - Last phlebotomy was on 01/30/2022. - She denies any aquagenic pruritus, erythromelalgia, or other vasomotor symptoms. - Labs today (12/03/2022): Hgb 14.0/hematocrit 42.6, carboxyhemoglobin remains minimally elevated at 3.3%, but significantly improved from previously. - Discussed the primary treatment of secondary polycythemia and treatment of the underlying condition.  Discussed that phlebotomy may have some use for symptom relief or incidence of hematocrit >54.0 to prevent VTE, MI, CVA - PLAN:  Blood counts have normalized after patient quit smoking.  Will discharge to PCP, but patient can certainly referred back to [place] in the future if there are any new or recurrent abnormalities.   2.   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 Right hemiparesis improved as did her speech. She continues to have some word finding difficulties. - MR angiogram of the head and neck region was done on 10/12/2019. She is on aspirin 325 mg daily. - PLAN: Continue aspirin 325 mg daily   PLAN SUMMARY: >> Tentative discharge from clinic with PCP follow-up     REVIEW OF SYSTEMS:   Review of Systems  Constitutional:  Positive for fatigue. Negative for appetite change, chills, diaphoresis, fever and unexpected weight change.  HENT:   Negative for lump/mass and nosebleeds.   Eyes:  Negative for eye problems.  Respiratory:  Negative for cough, hemoptysis and shortness of breath.   Cardiovascular:  Negative for chest pain, leg swelling and palpitations.  Gastrointestinal:  Negative for abdominal pain, blood in stool, constipation, diarrhea, nausea  and vomiting.  Genitourinary:  Negative for hematuria.   Musculoskeletal:  Positive for back pain.  Skin: Negative.   Neurological:  Negative for dizziness, headaches and  light-headedness.  Hematological:  Does not bruise/bleed easily.     PHYSICAL EXAM:  ECOG PERFORMANCE STATUS: 1 - Symptomatic but completely ambulatory  Vitals:   12/03/22 1429  BP: (!) 108/52  Pulse: 67  Resp: 18  Temp: 97.7 F (36.5 C)  SpO2: 91%   Filed Weights   12/03/22 1423  Weight: 213 lb (96.6 kg)   Physical Exam Constitutional:      Appearance: Normal appearance. She is obese.  Cardiovascular:     Heart sounds: Normal heart sounds.  Pulmonary:     Breath sounds: Normal breath sounds.  Neurological:     General: No focal deficit present.     Mental Status: Mental status is at baseline.  Psychiatric:        Behavior: Behavior normal. Behavior is cooperative.     PAST MEDICAL/SURGICAL HISTORY:  Past Medical History:  Diagnosis Date   Acute pyelonephritis    Asthma    Bipolar disorder (HCC)    Colon polyp    Complication of anesthesia    "sometimes hard to put to sleep"   Depression    Bipolar   Gastric ulcer    around 2013.  stress related   GERD (gastroesophageal reflux disease)    History of kidney stones    Hyperlipidemia    Hypothyroid    Polyp, uterus corpus    Sepsis due to Escherichia coli (E. coli) (HCC) 09/04/2018   Sleep apnea    no cpap used   Stroke [facility])    Past Surgical History:  Procedure Laterality Date   ABLATION ON ENDOMETRIOSIS     COLONOSCOPY     Per patient, done around 2013 in [place], had polyp and overdue for follow up.   COLONOSCOPY WITH PROPOFOL N/A 05/12/2018   Procedure: COLONOSCOPY WITH PROPOFOL;  Surgeon: Corbin Ade, MD;  Location: [facility];  Service: Endoscopy;  Laterality: N/A;  12:00pm   CYSTOSCOPY W/ URETERAL STENT PLACEMENT Left 09/01/2018   Procedure: CYSTOSCOPY WITH RETROGRADE PYELOGRAM/URETERAL STENT PLACEMENT;  Surgeon: Rene Paci, MD;  Location: [facility] ORS;  Service: Urology;  Laterality: Left;   CYSTOSCOPY/URETEROSCOPY/HOLMIUM LASER/STENT PLACEMENT Left 09/17/2018   Procedure:  CYSTOSCOPY/URETEROSCOPY/HOLMIUM LASER/STENT PLACEMENT;  Surgeon: Rene Paci, MD;  Location: [facility] OR[facility];  Service: Urology;  Laterality: Left;  ONLY NEEDS 45 MIN   OVARIAN CYST SURGERY Left    POLYPECTOMY  05/12/2018   Procedure: POLYPECTOMY;  Surgeon: Corbin Ade, MD;  Location: [facility];  Service: Endoscopy;;  polyp at splenic flexure   TEMPOROMANDIBULAR JOINT SURGERY     9 surgeries   TUBAL LIGATION     vaginal childbirth     1    SOCIAL HISTORY:  Social History   Socioeconomic History   Marital status: Married    Spouse name: Not on file   Number of children: 1   Years of education: Not on file   Highest education level: High school graduate  Occupational History   Occupation: disability  Tobacco Use   Smoking status: Every Day    Current packs/day: 0.50    Average packs/day: 0.5 packs/day for 30.0 years (15.0 ttl pk-yrs)    Types: Cigarettes   Smokeless tobacco: Never   Tobacco comments:    Is smoking about 0.5 of a pack and has a patch placed on  Vaping Use   Vaping status: Never Used  Substance and Sexual Activity   Alcohol use: Never   Drug use: Not Currently   Sexual activity: Yes    Partners: Male    Birth control/protection: Surgical    Comment: tubal  Other Topics Concern   Not on file  Social History Narrative   Not on file   Social Determinants of Health   Financial Resource Strain: Low Risk  (07/16/2022)   Overall Financial Resource Strain (CARDIA)    Difficulty of Paying Living Expenses: Not very hard  Food Insecurity: Food Insecurity Present (07/16/2022)   Hunger Vital Sign    Worried About Running Out of Food in the Last Year: Sometimes true    Ran Out of Food in the Last Year: Sometimes true  Transportation Needs: No Transportation Needs (07/16/2022)   PRAPARE - Administrator, Civil Service (Medical): No    Lack of Transportation (Non-Medical): No  Physical Activity: Inactive (07/16/2022)   Exercise Vital Sign     Days of Exercise per Week: 0 days    Minutes of Exercise per Session: 0 min  Stress: No Stress Concern Present (07/16/2022)   [employer] of Occupational Health - Occupational Stress Questionnaire    Feeling of Stress : Not at all  Social Connections: Moderately Isolated (07/16/2022)   Social Connection and Isolation Panel [NHANES]    Frequency of Communication with Friends and Family: More than three times a week    Frequency of Social Gatherings with Friends and Family: More than three times a week    Attends Religious Services: Never    Database administrator or Organizations: No    Attends Banker Meetings: Never    Marital Status: Married  Catering manager Violence: Not At Risk (07/16/2022)   Humiliation, Afraid, Rape, and Kick questionnaire    Fear of Current or Ex-Partner: No    Emotionally Abused: No    Physically Abused: No    Sexually Abused: No    FAMILY HISTORY:  Family History  Problem Relation Age of Onset   Anxiety disorder Mother    Hypertension Mother    Heart failure Mother    Stroke Mother    Hyperlipidemia Father    Diabetes Father    Stroke Father    Hypertension Father    Colon polyps Father        older than 59   Diabetes Sister    Hypertension Sister    Ovarian cancer Sister    Ovarian cancer Maternal Grandmother    Colon cancer Maternal Grandmother    Renal Disease Paternal Grandmother    Heart attack Paternal Grandfather    Asthma Daughter    Bipolar disorder Other    Asthma Niece     CURRENT MEDICATIONS:  Outpatient Encounter Medications as of 12/03/2022  Medication Sig   albuterol (VENTOLIN HFA) 108 (90 Base) MCG/ACT inhaler Inhale 1 puff into the lungs every 6 (six) hours as needed for wheezing or shortness of breath.   atorvastatin (LIPITOR) 80 MG tablet TAKE ONE TABLET AT BEDTIME   betamethasone valerate ointment (VALISONE) 0.1 % Apply 1 Application topically 2 (two) times daily. To psoriatic plaques x10-14 days as  needed for flareups   CALCIUM CITRATE-VITAMIN D3 PO Take 1 tablet by mouth daily.   Cariprazine HCl (VRAYLAR) 6 MG CAPS Take 1 capsule (6 mg total) by mouth daily.   Cholecalciferol (VITAMIN D3) 5000 units TABS Take 5,000 Units by mouth daily.  clonazePAM (KLONOPIN) 1 MG tablet Take 1 tablet (1 mg total) by mouth 2 (two) times daily.   Dexlansoprazole (DEXILANT) 30 MG capsule DR TAKE (1) CAPSULE DAILY   famotidine (PEPCID) 20 MG tablet TAKE ONE TABLET BY MOUTH EVERY DAY   FLOVENT HFA 220 MCG/ACT inhaler INHALE 2 PUFFS TWICE DAILY   fluticasone (FLONASE) 50 MCG/ACT nasal spray SPRAY ONE SPRAY IN EACH NOSTRIL DAILY AT night TO help with morning congestion   gabapentin (NEURONTIN) 300 MG capsule Take 1 capsule at supper and 1 capsule at bedtime.  Further fills per Chriss Czar, Neurology   lamoTRIgine (LAMICTAL) 100 MG tablet Take 3 tablets (300 mg total) by mouth at bedtime.   levothyroxine (SYNTHROID) 50 MCG tablet TAKE ONE TABLET DAILY   loratadine (CLARITIN) 10 MG tablet TAKE 1 TABLET DAILY   magnesium gluconate (MAGONATE) 500 MG tablet Take 500 mg by mouth daily.   Multiple Vitamins-Minerals (MULTIVITAMIN WOMEN PO) Take 1 tablet by mouth daily.   QUEtiapine (SEROQUEL) 100 MG tablet Take 1.5 tablets (150 mg total) by mouth at bedtime.   tiZANidine (ZANAFLEX) 4 MG tablet Take 0.5-1 tablets (2-4 mg total) by mouth every 8 (eight) hours as needed for muscle spasms.   venlafaxine XR (EFFEXOR-XR) 150 MG 24 hr capsule Take 2 capsules (300 mg total) by mouth daily.   [DISCONTINUED] MODERNA COVID-19 BIVAL BOOSTER 50 MCG/0.5ML injection  (Patient not taking: Reported on 12/03/2022)   No facility-administered encounter medications on file as of 12/03/2022.    ALLERGIES:  Allergies  Allergen Reactions   Lithium Other (See Comments)    Psoriasis     LABORATORY DATA:  I have reviewed the labs as listed.  CBC    Component Value Date/Time   WBC 7.5 12/03/2022 1237   RBC 4.10 12/03/2022 1237    HGB 14.0 12/03/2022 1237   HGB 14.3 10/29/2022 1519   HCT 42.6 12/03/2022 1237   HCT 41.8 10/29/2022 1519   PLT 185 12/03/2022 1237   PLT 205 10/29/2022 1519   MCV 103.9 (H) 12/03/2022 1237   MCV 99 (H) 10/29/2022 1519   MCH 34.1 (H) 12/03/2022 1237   MCHC 32.9 12/03/2022 1237   RDW 14.0 12/03/2022 1237   RDW 12.4 10/29/2022 1519   LYMPHSABS 1.6 12/03/2022 1237   LYMPHSABS 1.7 07/06/2020 1447   MONOABS 0.7 12/03/2022 1237   EOSABS 0.0 12/03/2022 1237   EOSABS 0.0 07/06/2020 1447   BASOSABS 0.1 12/03/2022 1237   BASOSABS 0.0 07/06/2020 1447      Latest Ref Rng & Units 10/29/2022    3:19 PM 05/24/2022    1:52 PM 01/22/2022    2:43 PM  CMP  Glucose 70 - 99 mg/dL 91  696  85   BUN 6 - 24 mg/dL 11  13  12    Creatinine 0.57 - 1.00 mg/dL 2.95  2.84  1.32   Sodium 134 - 144 mmol/L 142  134  142   Potassium 3.5 - 5.2 mmol/L 4.5  3.7  4.2   Chloride 96 - 106 mmol/L 101  96  106   CO2 20 - 29 mmol/L 30  28  29    Calcium 8.7 - 10.2 mg/dL 9.4  8.6  8.9   Total Protein 6.0 - 8.5 g/dL 6.3  6.7  6.7   Total Bilirubin 0.0 - 1.2 mg/dL 0.2  0.4  0.4   Alkaline Phos 44 - 121 IU/L 123  96  95   AST 0 - 40 IU/L 37  33  25   ALT 0 - 32 IU/L 53  36  32     DIAGNOSTIC IMAGING:  I have independently reviewed the relevant imaging and discussed with the patient.   WRAP UP:  All questions were answered. The patient knows to call the clinic with any problems, questions or concerns.  Medical decision making: Low  Time spent on visit: I spent 15 minutes counseling the patient face to face. The total time spent in the appointment was 22 minutes and more than 50% was on counseling.  Carnella Guadalajara, PA-C  12/03/22 3:32 PM

## 2022-12-01 NOTE — Progress Notes (Unsigned)
BH MD/PA/NP OP Progress Note  12/06/2022 3:35 PM Rebecca Moreno  MRN:  [id number]  Chief Complaint:  Chief Complaint  Patient presents with   Follow-up   HPI:  According to the chart review, the following events have occurred since the last visit: The patient was seen by otolaryngologist in 10/2022. The patient was ordered laryngoscopy, ENT referral.   She states that she has hoarseness in her voice.  She feels tired of it.  She continues to take care of her mother-in-law.  Her mother-in-law had UTI.  She occasionally feels frustrated secondary to the interaction with her mother-in-law.  She was accused that she hang up on the phone on her father, which led him to leave. Her mother in law is bed ridden, and she sees a provider through phone.  She states that her mood is fine.  She has her nails done when she has a free time.  Although she does not feel ill, she gets mad and ancy inside.  She denies any yelling or aggression.  She states that her husband does not want to be with her as she is "fat." She tries to go outside, and talk out loud when she feels stressed.  She usually feels better afterwards. The patient has mood symptoms as in PHQ-9/GAD-7. She denies SI.  She does not think she can taper down clonazepam as she needs it through the day.  She has not noticed any change since lowering the dose of quetiapine, and is willing to do further tapering down.   Daily routine: takes care of her mother in law Employment: on disability after TMJ surgery in 2007. 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 Her parents had marital discordance and the patient screamed when she was a child as she did not want to hear them. She reports good relationship with both of her parents otherwise. She has one sister, who she has good relationship with   Substance use  Tobacco Alcohol Other  substances/  Current Quit since Dec 2023 denies denies  Past  denies denies  Past Treatment          Wt Readings from Last 3 Encounters:  12/06/22 217 lb 12.8 oz (98.8 kg)  12/03/22 213 lb (96.6 kg)  10/29/22 217 lb 1 oz (98.5 kg)    03/10/18 220 lb (99.8 kg)  03/04/18 221 lb (100.2 kg)  02/13/18 221 lb (100.2 kg)    Visit Diagnosis:    ICD-10-CM   1. MDD (major depressive disorder), recurrent episode, moderate (HCC)  F33.1     2. Anxiety disorder, unspecified type  F41.9     3. High risk medication use  Z79.899 Urine Drug Panel 7      Past Psychiatric History: Please see initial evaluation for full details. I have reviewed the history. No updates at this time.     Past Medical History:  Past Medical History:  Diagnosis Date   Acute pyelonephritis    Asthma    Bipolar disorder (HCC)    Colon polyp    Complication of anesthesia    "sometimes hard to put to sleep"   Depression    Bipolar   Gastric ulcer    around 2013.  stress related   GERD (gastroesophageal reflux disease)    History of kidney stones    Hyperlipidemia    Hypothyroid  Polyp, uterus corpus    Sepsis due to Escherichia coli (E. coli) (HCC) 09/04/2018   Sleep apnea    no cpap used   Stroke [facility])     Past Surgical History:  Procedure Laterality Date   ABLATION ON ENDOMETRIOSIS     COLONOSCOPY     Per patient, done around 2013 in [place], had polyp and overdue for follow up.   COLONOSCOPY WITH PROPOFOL N/A 05/12/2018   Procedure: COLONOSCOPY WITH PROPOFOL;  Surgeon: Corbin Ade, MD;  [facility];  Service: Endoscopy;  Laterality: N/A;  12:00pm   CYSTOSCOPY W/ URETERAL STENT PLACEMENT Left 09/01/2018   Procedure: CYSTOSCOPY WITH RETROGRADE PYELOGRAM/URETERAL STENT PLACEMENT;  Surgeon: Rene Paci, MD;  Location: [facility];  Service: Urology;  Laterality: Left;   CYSTOSCOPY/URETEROSCOPY/HOLMIUM LASER/STENT PLACEMENT Left 09/17/2018   Procedure:  CYSTOSCOPY/URETEROSCOPY/HOLMIUM LASER/STENT PLACEMENT;  Surgeon: Rene Paci, MD;  Location: [facility];  Service: Urology;  Laterality: Left;  ONLY NEEDS 45 MIN   OVARIAN CYST SURGERY Left    POLYPECTOMY  05/12/2018   Procedure: POLYPECTOMY;  Surgeon: Corbin Ade, MD;  [facility];  Service: Endoscopy;;  polyp at splenic flexure   TEMPOROMANDIBULAR JOINT SURGERY     9 surgeries   TUBAL LIGATION     vaginal childbirth     1    Family Psychiatric History: Please see initial evaluation for full details. I have reviewed the history. No updates at this time.     Family History:  Family History  Problem Relation Age of Onset   Anxiety disorder Mother    Hypertension Mother    Heart failure Mother    Stroke Mother    Hyperlipidemia Father    Diabetes Father    Stroke Father    Hypertension Father    Colon polyps Father        older than 74   Diabetes Sister    Hypertension Sister    Ovarian cancer Sister    Ovarian cancer Maternal Grandmother    Colon cancer Maternal Grandmother    Renal Disease Paternal Grandmother    Heart attack Paternal Grandfather    Asthma Daughter    Bipolar disorder Other    Asthma Niece     Social History:  Social History   Socioeconomic History   Marital status: Married    Spouse name: Not on file   Number of children: 1   Years of education: Not on file   Highest education level: High school graduate  Occupational History   Occupation: disability  Tobacco Use   Smoking status: Every Day    Current packs/day: 0.50    Average packs/day: 0.5 packs/day for 30.0 years (15.0 ttl pk-yrs)    Types: Cigarettes   Smokeless tobacco: Never   Tobacco comments:    Is smoking about 0.5 of a pack and has a patch placed on   Vaping Use   Vaping status: Never Used  Substance and Sexual Activity   Alcohol use: Never   Drug use: Not Currently   Sexual activity: Yes    Partners: Male    Birth control/protection: Surgical     Comment: tubal  Other Topics Concern   Not on file  Social History Narrative   Not on file   Social Determinants of Health   Financial Resource Strain: Low Risk  (07/16/2022)   Overall Financial Resource Strain (CARDIA)    Difficulty of Paying Living Expenses: Not very hard  Food Insecurity: Food  Insecurity Present (07/16/2022)   Hunger Vital Sign    Worried About Running Out of Food in the Last Year: Sometimes true    Ran Out of Food in the Last Year: Sometimes true  Transportation Needs: No Transportation Needs (07/16/2022)   PRAPARE - Administrator, Civil Service (Medical): No    Lack of Transportation (Non-Medical): No  Physical Activity: Inactive (07/16/2022)   Exercise Vital Sign    Days of Exercise per Week: 0 days    Minutes of Exercise per Session: 0 min  Stress: No Stress Concern Present (07/16/2022)   [employer] of Occupational Health - Occupational Stress Questionnaire    Feeling of Stress : Not at all  Social Connections: Moderately Isolated (07/16/2022)   Social Connection and Isolation Panel [NHANES]    Frequency of Communication with Friends and Family: More than three times a week    Frequency of Social Gatherings with Friends and Family: More than three times a week    Attends Religious Services: Never    Database administrator or Organizations: No    Attends Banker Meetings: Never    Marital Status: Married    Allergies:  Allergies  Allergen Reactions   Lithium Other (See Comments)    Psoriasis     Metabolic Disorder Labs: Lab Results  Component Value Date   HGBA1C 6.2 (H) 10/29/2022   No results found for: "PROLACTIN" Lab Results  Component Value Date   CHOL 212 (H) 12/18/2018   TRIG 171 (H) 12/18/2018   HDL 44 12/18/2018   CHOLHDL 4.8 (H) 12/18/2018   LDLCALC 134 (H) 12/18/2018   LDLCALC 168 (H) 01/15/2018   Lab Results  Component Value Date   TSH 0.939 10/29/2022   TSH 1.100 09/04/2021    Therapeutic  Level Labs: No results found for: "LITHIUM" No results found for: "VALPROATE" No results found for: "CBMZ"  Current Medications: Current Outpatient Medications  Medication Sig Dispense Refill   albuterol (VENTOLIN HFA) 108 (90 Base) MCG/ACT inhaler Inhale 1 puff into the lungs every 6 (six) hours as needed for wheezing or shortness of breath. 6.7 g 1   atorvastatin (LIPITOR) 80 MG tablet TAKE ONE TABLET AT BEDTIME 100 tablet 0   betamethasone valerate ointment (VALISONE) 0.1 % Apply 1 Application topically 2 (two) times daily. To psoriatic plaques x10-14 days as needed for flareups 45 g 1   CALCIUM CITRATE-VITAMIN D3 PO Take 1 tablet by mouth daily.     Cariprazine HCl (VRAYLAR) 6 MG CAPS Take 1 capsule (6 mg total) by mouth daily. 90 capsule 0   Cholecalciferol (VITAMIN D3) 5000 units TABS Take 5,000 Units by mouth daily.      Dexlansoprazole (DEXILANT) 30 MG capsule DR TAKE (1) CAPSULE DAILY 90 capsule 3   famotidine (PEPCID) 20 MG tablet TAKE ONE TABLET BY MOUTH EVERY DAY 90 tablet 0   FLOVENT HFA 220 MCG/ACT inhaler INHALE 2 PUFFS TWICE DAILY 12 g 0   fluticasone (FLONASE) 50 MCG/ACT nasal spray SPRAY ONE SPRAY IN EACH NOSTRIL DAILY AT night TO help with morning congestion 16 g 5   gabapentin (NEURONTIN) 300 MG capsule Take 1 capsule at supper and 1 capsule at bedtime.  Further fills per Chriss Czar, Neurology 180 capsule 0   lamoTRIgine (LAMICTAL) 100 MG tablet Take 3 tablets (300 mg total) by mouth at bedtime. 270 tablet 1   levothyroxine (SYNTHROID) 50 MCG tablet TAKE ONE TABLET DAILY 90 tablet 0   loratadine (CLARITIN)  10 MG tablet TAKE 1 TABLET DAILY 30 tablet 5   magnesium gluconate (MAGONATE) 500 MG tablet Take 500 mg by mouth daily.     Multiple Vitamins-Minerals (MULTIVITAMIN WOMEN PO) Take 1 tablet by mouth daily.     tiZANidine (ZANAFLEX) 4 MG tablet Take 0.5-1 tablets (2-4 mg total) by mouth every 8 (eight) hours as needed for muscle spasms. 60 tablet 0   venlafaxine XR  (EFFEXOR-XR) 150 MG 24 hr capsule Take 2 capsules (300 mg total) by mouth daily. 180 capsule 1   [START ON 12/10/2022] clonazePAM (KLONOPIN) 1 MG tablet Take 1 tablet (1 mg total) by mouth 2 (two) times daily. 60 tablet 1   QUEtiapine (SEROQUEL) 50 MG tablet Take 1 tablet (50 mg total) by mouth at bedtime. 90 tablet 0   No current facility-administered medications for this visit.     Musculoskeletal: Strength & Muscle Tone: within normal limits Gait & Station: normal Patient leans: N/A  Psychiatric Specialty Exam: Review of Systems  Psychiatric/Behavioral:  Positive for dysphoric mood. Negative for agitation, behavioral problems, confusion, decreased concentration, hallucinations, self-injury, sleep disturbance and suicidal ideas. The patient is nervous/anxious. The patient is not hyperactive.   All other systems reviewed and are negative.   Blood pressure 123/80, pulse 82, temperature (!) 97.5 F (36.4 C), temperature source Skin, height 5\' 6"  (1.676 m), weight 217 lb 12.8 oz (98.8 kg), last menstrual period 12/13/2017.Body mass index is 35.15 kg/m.  General Appearance: Fairly Groomed  Eye Contact:  Good  Speech:  Clear and Coherent  Volume:  Normal  Mood:   good  Affect:  Appropriate, Congruent, and calm  Thought Process:  Coherent  Orientation:  Full (Time, Place, and Person)  Thought Content: Logical   Suicidal Thoughts:  No  Homicidal Thoughts:  No  Memory:  Immediate;   Good  Judgement:  Good  Insight:  Good  Psychomotor Activity:  Normal  Concentration:  Concentration: Good and Attention Span: Good  Recall:  Good  Fund of Knowledge: Good  Language: Good  Akathisia:  No  Handed:  Right  AIMS (if indicated): not done  Assets:  Communication Skills Desire for Improvement  ADL's:  Intact  Cognition: WNL  Sleep:  Good   Screenings: GAD-7    Flowsheet Row Office Visit from 10/29/2022 in [facility] Office Visit from 06/05/2022 in  [facility] Office Visit from 03/20/2022 in [facility] Office Visit from 02/21/2022 in [facility] Office Visit from 01/17/2022 in [facility]  Total GAD-7 Score 4 10 13  0 0      Mini-Mental    Flowsheet Row Office Visit from 08/28/2021 in [facility] Clinical Support from 07/22/2018 in [facility]  Total Score (max 30 points ) 29 30      PHQ2-9    Flowsheet Row Office Visit from 10/29/2022 in [facility] Clinical Support from 07/16/2022 in [facility] Office Visit from 06/05/2022 in [facility] Office Visit from 03/20/2022 in [facility] Office Visit from 02/21/2022 in [facility]  PHQ-2 Total Score 2 0 1 1 0  PHQ-9 Total Score 10 -- 3 6 0      Flowsheet Row Office Visit from 10/19/2021 in [facility]  Video Visit from 11/15/2020 in [facility] Video Visit from 08/18/2020 in [facility]  C-SSRS RISK CATEGORY No Risk No Risk No Risk        Assessment and Plan:  Rebecca Moreno is a 57 y.o. year old female with a history of  depression, hypothyroidism, obstructive sleep apnea (CPAP machine), stroke, Hyperlipidemia, Hypertension, stroke, who presents for follow up appointment for below.   1. MDD (major depressive disorder), recurrent episode, moderate (HCC) 2. Anxiety disorder, unspecified type # Bipolar I disorder by history Acute stressors include:  Other stressors include: marital conflict, caregiver of her mother in law    History: tx from [place]. Dx bipolar I disorder, admitted four times for  being delusional, last in 2007. No SA. Never experienced mania. history of paranoia and irritability, VH in the context of depression. Originally on venlafaxine 300 mg daily, lamotrigine 300 mg daily, Vraylar 6 mg, quetiapine 300 mg at night, clonazepam 1 mg BID   Although she reports anxiety, occasional down mood in relate to the stressors as above, it has been overall manageable despite lowering the dose of quetiapine.  We do further tapering down of quetiapine to avoid polypharmacy.  Will continue venlafaxine, lamotrigine, Vraylar to target depression. It is noted that she was initially prescribed both quetiapine and Vraylar when first seen by this provider, diagnosed with bipolar I disorder by her previous provider. Both the patient and her husband report a history of paranoia, irritability, and visual hallucinations during episodes of depression in the past. It remains unclear whether she experienced manic symptoms otherwise. Despite attempts, records from her previous psychiatrist were not obtained.  3. High risk medication use Will obtain UDS given she is on clonazepam.   # benzodiazepine dependence She has been on the current dose of clonazepam since the initial visit with this writer without obvious concern of overusing this medication.  She declined to taper down this medication.  Will prioritize tapering off quetiapine first, and will consider slow tapering down of this medication moving forward to avoid long-term risk of tolerance, dependency.    Plan Continue venlafaxine 300 mg daily Continue Lamotrigine 300 mg daily Continue Vraylar 6 mg daily (Qtc 410 msec, hr 83, 10/2022) Decrease quetiapine 50 mg at night  Continue clonazepam 1 mg twice a day as needed for anxiety  Obtain urine screening at [facility]  Next appointment: 10/2 at 10:30 video. She will continue to see this Clinical research associate, although transfer to [facility] was attempted in the past.     Past trials of medication: sertraline,  fluoxetine, lexapro, Celexa, duloxetine, Wellbutrin  lithium (psoriasis), Depakote (did not work), olanzapine, Abilify (weight gain), latuda, Geodon    The patient demonstrates the following risk factors for suicide: Chronic risk factors for suicide include: psychiatric disorder of depression. Acute risk factors for suicide include: family or marital conflict and unemployment. Protective factors for this patient include: positive social support and hope for the future. Considering these factors, the overall suicide risk at this point appears to be low. Patient is appropriate for outpatient follow up.  Collaboration of Care: Collaboration of Care: Other reviewed notes in Epic  Patient/Guardian was advised Release of Information must be obtained prior to any record release in order to collaborate their care with an outside provider. Patient/Guardian was advised if they have not already done so to contact the registration department to sign all necessary forms in order for [place] to release information regarding their care.  Consent: Patient/Guardian gives verbal consent for treatment and assignment of benefits for services provided during this visit. Patient/Guardian expressed understanding and agreed to proceed.    Neysa Hotter, MD 12/06/2022, 3:35 PM

## 2022-12-03 ENCOUNTER — Inpatient Hospital Stay (HOSPITAL_BASED_OUTPATIENT_CLINIC_OR_DEPARTMENT_OTHER): Payer: 59 | Admitting: Physician Assistant

## 2022-12-03 ENCOUNTER — Inpatient Hospital Stay: Payer: 59 | Attending: Hematology

## 2022-12-03 ENCOUNTER — Telehealth: Payer: Self-pay | Admitting: *Deleted

## 2022-12-03 VITALS — BP 108/52 | HR 67 | Temp 97.7°F | Resp 18 | Wt 213.0 lb

## 2022-12-03 DIAGNOSIS — Z7982 Long term (current) use of aspirin: Secondary | ICD-10-CM | POA: Diagnosis not present

## 2022-12-03 DIAGNOSIS — F17210 Nicotine dependence, cigarettes, uncomplicated: Secondary | ICD-10-CM | POA: Diagnosis not present

## 2022-12-03 DIAGNOSIS — G4733 Obstructive sleep apnea (adult) (pediatric): Secondary | ICD-10-CM | POA: Diagnosis not present

## 2022-12-03 DIAGNOSIS — D751 Secondary polycythemia: Secondary | ICD-10-CM | POA: Insufficient documentation

## 2022-12-03 DIAGNOSIS — T5891XD Toxic effect of carbon monoxide from unspecified source, accidental (unintentional), subsequent encounter: Secondary | ICD-10-CM

## 2022-12-03 DIAGNOSIS — Z79899 Other long term (current) drug therapy: Secondary | ICD-10-CM | POA: Diagnosis not present

## 2022-12-03 LAB — CBC WITH DIFFERENTIAL/PLATELET
Abs Immature Granulocytes: 0.05 10*3/uL (ref 0.00–0.07)
Basophils Absolute: 0.1 10*3/uL (ref 0.0–0.1)
Basophils Relative: 1 %
Eosinophils Absolute: 0 10*3/uL (ref 0.0–0.5)
Eosinophils Relative: 0 %
HCT: 42.6 % (ref 36.0–46.0)
Hemoglobin: 14 g/dL (ref 12.0–15.0)
Immature Granulocytes: 1 %
Lymphocytes Relative: 21 %
Lymphs Abs: 1.6 10*3/uL (ref 0.7–4.0)
MCH: 34.1 pg — ABNORMAL HIGH (ref 26.0–34.0)
MCHC: 32.9 g/dL (ref 30.0–36.0)
MCV: 103.9 fL — ABNORMAL HIGH (ref 80.0–100.0)
Monocytes Absolute: 0.7 10*3/uL (ref 0.1–1.0)
Monocytes Relative: 9 %
Neutro Abs: 5.1 10*3/uL (ref 1.7–7.7)
Neutrophils Relative %: 68 %
Platelets: 185 10*3/uL (ref 150–400)
RBC: 4.1 MIL/uL (ref 3.87–5.11)
RDW: 14 % (ref 11.5–15.5)
WBC: 7.5 10*3/uL (ref 4.0–10.5)
nRBC: 0.3 % — ABNORMAL HIGH (ref 0.0–0.2)

## 2022-12-03 LAB — CARBOXYHEMOGLOBIN - COOX: Carboxyhemoglobin: 3.3 % — ABNORMAL HIGH (ref 0.5–1.5)

## 2022-12-03 NOTE — Patient Instructions (Signed)
[facility] at [facility] **VISIT SUMMARY & IMPORTANT INSTRUCTIONS **   You were seen today by Rojelio Brenner PA-C for your elevated red blood cells.   In the past, you had elevated red blood cells, which put you at increased risk of blood clot, heart attack, and stroke. Ever since you quit smoking, your blood count has returned to normal! Keep up the great work!!! Your carbon monoxide levels remain mildly elevated.  I recommend that you check your house for any carbon monoxide leaks.  Otherwise, you can follow-up on this with your primary care provider. You do not need to schedule any follow-up visits with [place] at this time, but if any new or recurrent blood abnormalities, your primary care doctor can refer you back to [place] in the future.   - - - - - - - - - - - - - - - - - -   ** Thank you for trusting me with your healthcare!  I strive to provide all of my patients with quality care at each visit.  If you receive a survey for this visit, I would be so grateful to you for taking the time to provide feedback.  Thank you in advance!  ~ Inigo Lantigua                   Dr. Doreatha Massed   &   Rojelio Brenner, PA-C   - - - - - - - - - - - - - - - - - -    Thank you for choosing [facility] at [facility] to provide your oncology and hematology care.  To afford each patient quality time with our provider, please arrive at least 15 minutes before your scheduled appointment time.   If you have a lab appointment with the Cancer Center please come in thru the Main Entrance and check in at the main information desk.  You need to re-schedule your appointment should you arrive 10 or more minutes late.  We strive to give you quality time with our providers, and arriving late affects you and other patients whose appointments are after yours.  Also, if you no show three or more times for appointments you may be dismissed from the clinic at the providers  discretion.     Again, thank you for choosing [facility].  Our hope is that these requests will decrease the amount of time that you wait before being seen by our physicians.       _____________________________________________________________  Should you have questions after your visit to [facility], please contact our office at [phone] and follow the prompts.  Our office hours are 8:00 a.m. and 4:30 p.m. Monday - Friday.  Please note that voicemails left after 4:00 p.m. may not be returned until the following business day.  We are closed weekends and major holidays.  You do have access to a nurse 24-7, just call the main number to the clinic [phone] and do not press any options, hold on the line and a nurse will answer the phone.    For prescription refill requests, have your pharmacy contact our office and allow 72 hours.

## 2022-12-03 NOTE — Telephone Encounter (Signed)
Fax from [facility] for office notes to support pt's need for continued home oxygen therapy. Faxed back that this was not prescribed by PCP. Called on 09/13/22 regarding this and they were to contact pt to find out who they provider was who wrote the order for her oxygen.

## 2022-12-06 ENCOUNTER — Encounter: Payer: Self-pay | Admitting: Family Medicine

## 2022-12-06 ENCOUNTER — Ambulatory Visit (INDEPENDENT_AMBULATORY_CARE_PROVIDER_SITE_OTHER): Payer: 59 | Admitting: Psychiatry

## 2022-12-06 ENCOUNTER — Encounter: Payer: Self-pay | Admitting: Psychiatry

## 2022-12-06 VITALS — BP 123/80 | HR 82 | Temp 97.5°F | Ht 66.0 in | Wt 217.8 lb

## 2022-12-06 DIAGNOSIS — Z79899 Other long term (current) drug therapy: Secondary | ICD-10-CM

## 2022-12-06 DIAGNOSIS — F331 Major depressive disorder, recurrent, moderate: Secondary | ICD-10-CM

## 2022-12-06 DIAGNOSIS — F419 Anxiety disorder, unspecified: Secondary | ICD-10-CM | POA: Diagnosis not present

## 2022-12-06 MED ORDER — QUETIAPINE FUMARATE 50 MG PO TABS: 50.0000 mg | ORAL_TABLET | Freq: Every day | ORAL | 0 refills | Status: DC

## 2022-12-06 MED ORDER — CLONAZEPAM 1 MG PO TABS: 1.0000 mg | ORAL_TABLET | Freq: Two times a day (BID) | ORAL | 1 refills | Status: DC

## 2022-12-06 NOTE — Patient Instructions (Signed)
Continue venlafaxine 300 mg daily Continue Lamotrigine 300 mg daily Continue Vraylar 6 mg daily  Decrease quetiapine 50 mg at night  Continue clonazepam 1 mg twice a day as needed for anxiety  Obtain urine screening at [facility]  Next appointment: 10/2 at 10:30

## 2022-12-11 DIAGNOSIS — R0902 Hypoxemia: Secondary | ICD-10-CM | POA: Diagnosis not present

## 2022-12-11 DIAGNOSIS — G4733 Obstructive sleep apnea (adult) (pediatric): Secondary | ICD-10-CM | POA: Diagnosis not present

## 2022-12-11 DIAGNOSIS — J449 Chronic obstructive pulmonary disease, unspecified: Secondary | ICD-10-CM | POA: Diagnosis not present

## 2022-12-18 ENCOUNTER — Other Ambulatory Visit: Payer: Self-pay | Admitting: Family Medicine

## 2022-12-18 DIAGNOSIS — J301 Allergic rhinitis due to pollen: Secondary | ICD-10-CM

## 2022-12-31 ENCOUNTER — Other Ambulatory Visit: Payer: Self-pay | Admitting: Family Medicine

## 2023-01-04 ENCOUNTER — Other Ambulatory Visit: Payer: Self-pay | Admitting: Family Medicine

## 2023-01-04 DIAGNOSIS — L409 Psoriasis, unspecified: Secondary | ICD-10-CM

## 2023-01-04 NOTE — Telephone Encounter (Signed)
Refill for steroid cream  Pt last seen 10/29/22  Next appt 05/28/22 for CPE  Send to [facility]

## 2023-01-10 NOTE — Telephone Encounter (Signed)
[facility] says pt is no longer seen at that office that initially prescribed this for pt because the office closed down. Needs PCP to address.

## 2023-01-11 ENCOUNTER — Other Ambulatory Visit: Payer: Self-pay | Admitting: Psychiatry

## 2023-01-11 DIAGNOSIS — G4733 Obstructive sleep apnea (adult) (pediatric): Secondary | ICD-10-CM | POA: Diagnosis not present

## 2023-01-11 DIAGNOSIS — J449 Chronic obstructive pulmonary disease, unspecified: Secondary | ICD-10-CM | POA: Diagnosis not present

## 2023-01-11 DIAGNOSIS — R0902 Hypoxemia: Secondary | ICD-10-CM | POA: Diagnosis not present

## 2023-01-11 NOTE — Telephone Encounter (Signed)
I don't know the answer as to why she is on O2 therapy.  Is this nocturnal desaturation?  If so, probably need updated nocturnal O2 study

## 2023-01-14 ENCOUNTER — Other Ambulatory Visit: Payer: Self-pay | Admitting: Psychiatry

## 2023-01-14 ENCOUNTER — Telehealth: Payer: Self-pay

## 2023-01-14 MED ORDER — VRAYLAR 6 MG PO CAPS: 6.0000 mg | ORAL_CAPSULE | Freq: Every day | ORAL | 0 refills | Status: DC

## 2023-01-14 NOTE — Telephone Encounter (Signed)
TC back to [place], since we did not initiate O2, we would have to see her in order to continue O2 orders, in looking over records do not see that she is seeing a Pulmonologist either.

## 2023-01-14 NOTE — Telephone Encounter (Signed)
pharmacy called requesting a refill on the vraylar. she states patient needs refills. told them that pt should have enough until 9-20. she states that last rx they got was 6-20 and that they fix her boxes on 6-20, 7-13 and 8-6.  Pt was last seen on 8-1 next appt 10-2

## 2023-01-14 NOTE — Telephone Encounter (Signed)
Thanks for checking. It seems she was receiving refills earlier each time. Regardless, a one-month supply has been ordered as she will run out by then.

## 2023-01-23 ENCOUNTER — Other Ambulatory Visit: Payer: Self-pay | Admitting: Family Medicine

## 2023-01-25 ENCOUNTER — Encounter: Payer: Self-pay | Admitting: Family Medicine

## 2023-02-01 NOTE — Progress Notes (Unsigned)
Sent a video visit link through Epic, but the patient didn't sign in. Tried calling for today's appointment, but got no answer. Left a voicemail instructing the patient to contact the office at [phone].  

## 2023-02-05 MED ORDER — QUETIAPINE FUMARATE 100 MG PO TABS: 100.0000 mg | ORAL_TABLET | Freq: Every day | ORAL | 0 refills | Status: DC

## 2023-02-06 ENCOUNTER — Telehealth (INDEPENDENT_AMBULATORY_CARE_PROVIDER_SITE_OTHER): Payer: Self-pay | Admitting: Psychiatry

## 2023-02-06 DIAGNOSIS — Z91199 Patient's noncompliance with other medical treatment and regimen due to unspecified reason: Secondary | ICD-10-CM

## 2023-02-10 DIAGNOSIS — G4733 Obstructive sleep apnea (adult) (pediatric): Secondary | ICD-10-CM | POA: Diagnosis not present

## 2023-02-10 DIAGNOSIS — R0902 Hypoxemia: Secondary | ICD-10-CM | POA: Diagnosis not present

## 2023-02-10 DIAGNOSIS — J449 Chronic obstructive pulmonary disease, unspecified: Secondary | ICD-10-CM | POA: Diagnosis not present

## 2023-02-12 ENCOUNTER — Encounter: Payer: Self-pay | Admitting: Family Medicine

## 2023-02-12 ENCOUNTER — Other Ambulatory Visit: Payer: Self-pay | Admitting: Psychiatry

## 2023-02-12 ENCOUNTER — Telehealth: Payer: Self-pay | Admitting: Family Medicine

## 2023-02-12 DIAGNOSIS — G4734 Idiopathic sleep related nonobstructive alveolar hypoventilation: Secondary | ICD-10-CM

## 2023-02-12 NOTE — Telephone Encounter (Signed)
Aware order for overnight pulse oximetry done and sent to [facility] and they will get in touch with him.

## 2023-02-12 NOTE — Telephone Encounter (Signed)
The refill has been ordered as requested. Please contact the patient to schedule a follow-up visit.

## 2023-02-12 NOTE — Telephone Encounter (Signed)
I think we've been trying to track down how her O2 was being ordered for a while.  I will cc Lynden Ang, who I believe has been trying to reach her about this for a while.  Yes, suspect she will need nocturnal O2 study in order to keep O2.  This has to be renewed intervally in order to keep O2 order active and since her pulmonologist retired there was likely no one on that side to renew/ reorder study.

## 2023-02-13 NOTE — Telephone Encounter (Signed)
Patient scheduled for Nov. In person. On wait list

## 2023-02-18 DIAGNOSIS — R0902 Hypoxemia: Secondary | ICD-10-CM | POA: Diagnosis not present

## 2023-02-19 ENCOUNTER — Encounter: Payer: Self-pay | Admitting: Family Medicine

## 2023-02-19 ENCOUNTER — Telehealth: Payer: 59 | Admitting: Family Medicine

## 2023-02-19 DIAGNOSIS — G4733 Obstructive sleep apnea (adult) (pediatric): Secondary | ICD-10-CM | POA: Diagnosis not present

## 2023-02-19 DIAGNOSIS — G4734 Idiopathic sleep related nonobstructive alveolar hypoventilation: Secondary | ICD-10-CM | POA: Diagnosis not present

## 2023-02-19 NOTE — Progress Notes (Signed)
MyChart Video visit  Subjective: CC: Nocturnal hypoxia, OSA PCP: Raliegh Ip, DO ZOX:[id number] H Rebecca is a 57 y.o. female. Patient provides verbal consent for consult held via video.  Due to COVID-19 pandemic this visit was conducted virtually. This visit type was conducted due to national recommendations for restrictions regarding the COVID-19 Pandemic (e.g. social distancing, sheltering in place) in an effort to limit this patient's exposure and mitigate transmission in our community. All issues noted in this document were discussed and addressed.  A physical exam was not performed with this format.   Location of patient: Home Location of provider: [facility] Others present for call: Family member  1.  Nocturnal hypoxia associated with OSA Patient has required BiPAP in the past with oxygen of 2 L at nighttime.  Nocturnal hypoxia noted on recent study.  Was under the care of Dr. Gerilyn Pilgrim but he is retired.  Needs new sleep doctor as she continues to have some excessive daytime sleepiness during the day despite compliance with therapies.   ROS: Per HPI  Allergies  Allergen Reactions   Lithium Other (See Comments)    Psoriasis    Past Medical History:  Diagnosis Date   Acute pyelonephritis    Asthma    Bipolar disorder (HCC)    Colon polyp    Complication of anesthesia    "sometimes hard to put to sleep"   Depression    Bipolar   Gastric ulcer    around 2013.  stress related   GERD (gastroesophageal reflux disease)    History of kidney stones    Hyperlipidemia    Hypothyroid    Polyp, uterus corpus    Sepsis due to Escherichia coli (E. coli) (HCC) 09/04/2018   Sleep apnea    no cpap used   Stroke [facility])     Current Outpatient Medications:    albuterol (VENTOLIN HFA) 108 (90 Base) MCG/ACT inhaler, Inhale 1 puff into the lungs every 6 (six) hours as needed for wheezing or shortness of breath., Disp: 6.7 g, Rfl: 1   atorvastatin (LIPITOR) 80 MG tablet, TAKE ONE TABLET AT  BEDTIME, Disp: 100 tablet, Rfl: 0   betamethasone valerate ointment (VALISONE) 0.1 %, APPLY TOPICALLY 2 TIMES A DAY TO PSORIASIS AS NEEDED FOR FLARE UPS, Disp: 45 g, Rfl: 1   CALCIUM CITRATE-VITAMIN D3 PO, Take 1 tablet by mouth daily., Disp: , Rfl:    Cariprazine HCl (VRAYLAR) 6 MG CAPS, Take 1 capsule (6 mg total) by mouth daily., Disp: 30 capsule, Rfl: 0   Cholecalciferol (VITAMIN D3) 5000 units TABS, Take 5,000 Units by mouth daily. , Disp: , Rfl:    clonazePAM (KLONOPIN) 1 MG tablet, Take 1 tablet (1 mg total) by mouth 2 (two) times daily., Disp: 60 tablet, Rfl: 0   Dexlansoprazole (DEXILANT) 30 MG capsule DR, TAKE (1) CAPSULE DAILY, Disp: 90 capsule, Rfl: 3   famotidine (PEPCID) 20 MG tablet, TAKE ONE TABLET BY MOUTH EVERY DAY, Disp: 90 tablet, Rfl: 0   FLOVENT HFA 220 MCG/ACT inhaler, INHALE 2 PUFFS TWICE DAILY, Disp: 12 g, Rfl: 0   fluticasone (FLONASE) 50 MCG/ACT nasal spray, SPRAY ONE SPRAY IN EACH NOSTRIL DAILY AT night TO help with morning congestion, Disp: 16 g, Rfl: 5   gabapentin (NEURONTIN) 300 MG capsule, Take 1 capsule at supper and 1 capsule at bedtime.  Further fills per Chriss Czar, Neurology, Disp: 180 capsule, Rfl: 0   lamoTRIgine (LAMICTAL) 100 MG tablet, Take 3 tablets (300 mg total) by mouth at  bedtime., Disp: 270 tablet, Rfl: 1   levothyroxine (SYNTHROID) 50 MCG tablet, TAKE ONE TABLET DAILY, Disp: 90 tablet, Rfl: 2   loratadine (CLARITIN) 10 MG tablet, TAKE 1 TABLET DAILY, Disp: 30 tablet, Rfl: 5   magnesium gluconate (MAGONATE) 500 MG tablet, Take 500 mg by mouth daily., Disp: , Rfl:    Multiple Vitamins-Minerals (MULTIVITAMIN WOMEN PO), Take 1 tablet by mouth daily., Disp: , Rfl:    QUEtiapine (SEROQUEL) 100 MG tablet, Take 1 tablet (100 mg total) by mouth at bedtime., Disp: 30 tablet, Rfl: 0   QUEtiapine (SEROQUEL) 50 MG tablet, Take 1 tablet (50 mg total) by mouth at bedtime., Disp: 90 tablet, Rfl: 0   tiZANidine (ZANAFLEX) 4 MG tablet, Take 0.5-1 tablets (2-4 mg  total) by mouth every 8 (eight) hours as needed for muscle spasms., Disp: 60 tablet, Rfl: 0   venlafaxine XR (EFFEXOR-XR) 150 MG 24 hr capsule, Take 2 capsules (300 mg total) by mouth daily., Disp: 180 capsule, Rfl: 1  Gen: nontoxic female Pulm: normal WOB on room air but lost voice so hoarse sounding  Assessment/ Plan: 57 y.o. female   Nocturnal hypoxia  Had been nocturnal oxygen study which did show desaturations.  Currently on 2 L by nasal cannula.  Has had requirement for BiPAP in the past and her sleep specialist has retired.  Needs establish with new 1 because she still has some excessive daytime sleepiness despite compliance with therapies.  Start time: 2:44p End time: 2:49p  Total time spent on patient care (including video visit/ documentation): 7 minutes  Rebecca Cavan Hulen Skains, DO [facility] [phone]

## 2023-02-25 ENCOUNTER — Telehealth: Payer: 59 | Admitting: Physician Assistant

## 2023-02-25 DIAGNOSIS — L2084 Intrinsic (allergic) eczema: Secondary | ICD-10-CM | POA: Diagnosis not present

## 2023-02-25 MED ORDER — TRIAMCINOLONE ACETONIDE 0.1 % EX CREA: 1.0000 | TOPICAL_CREAM | Freq: Two times a day (BID) | CUTANEOUS | 0 refills | Status: AC

## 2023-02-25 NOTE — Progress Notes (Signed)
E-Visit for Eczema  We are sorry that you are not feeling well. Here is how we plan to help! Based on what you shared with me it looks like you have eczema (atopic dermatitis).  Although the cause of eczema is not completely understood, genetics appear to play a strong role, and people with a family history of eczema are at increased risk of developing the condition. In most people with eczema, there is a genetic abnormality in the outermost layer of the skin, called the epidermis   Most people with eczema develop their first symptoms as children, before the age of five. Intense itching of the skin, patches of redness, small bumps, and skin flaking are common. Scratching can further inflame the skin and worsen the itching. The itchiness may be more noticeable at nighttime.  Eczema commonly affects the back of the neck, the elbow creases, and the backs of the knees. Other affected areas may include the face, wrists, and forearms. The skin may become thickened and darkened, or even scarred, from repeated scratching. Eliminating factors that aggravate your eczema symptoms can help to control the symptoms. Possible triggers may include: ? Cold or dry environments ? Sweating ? Emotional stress or anxiety ? Rapid temperature changes ? Exposure to certain chemicals or cleaning solutions, including soaps and detergents, perfumes and cosmetics, wool or synthetic fibers, dust, sand, and cigarette smoke Keeping your skin hydrated Emollients -- Emollients are creams and ointments that moisturize the skin and prevent it from drying out. The best emollients for people with eczema are thick creams (such as Eucerin, Cetaphil, and Nutraderm) or ointments (such as petroleum jelly, Aquaphor, and Vaseline), which contain little to no water. Emollients are most effective when applied immediately after bathing. Emollients can be applied twice a day or more often if needed. Lotions contain more water than creams and  ointments and are less effective for moisturizing the skin. Bathing -- It is not clear if showers or baths are better for keeping the skin hydrated. Lukewarm baths or showers can hydrate and cool the skin, temporarily relieving itching from eczema. An unscented, mild soap or non-soap cleanser (such as Cetaphil) should be used sparingly. Apply an emollient immediately after bathing or showering to prevent your skin from drying out as a result of water evaporation. Emollient bath additives (products you add to the bath water) have not been found to help relieve symptoms. Hot or long baths (more than 10 to 15 minutes) and showers should be avoided since they can dry out the skin.  Based on what you shared with me you may have eczema.   I have prescribed: and Triamcinalone ointment (or cream). Apply to the effected areas twice per day.  I recommend dilute bleach baths for people with eczema. These baths help to decrease the number of bacteria on the skin that can cause infections or worsen symptoms. To prepare a bleach bath, one-fourth to one-half cup of bleach is placed in a full bathtub (about 40 gallons) of water. Bleach baths are usually taken for 5 to 10 minutes twice per week and should be followed by application of an emollient (listed above). I recommend you take Benadryl 25mg - 50mg every 4 hours to control the symptoms (including itching) but if they last over 24 hours it is best that you see an office based provider for follow up.  HOME CARE: Take lukewarm showers or baths Apply creams and ointments to prevent the skin from drying (Eucerin, Cetaphil, Nutraderm, petroleum jelly, Aquaphor or   Vaseline) - these products contain less water than other lotions and are more effective for moisturizing the skin Limit exposure to cold or dry environments, sweating, emotional stress and anxiety, rapid temperature changes and exposure to chemicals and cleaning products, soaps and detergents, perfumes,  cosmetics, wool and synthetic fibers, dust, sand and cigarette- factors which can aggravate eczema symptoms.  Use a hydrocortisone cream once or twice a day Take an antihistamine like Benadryl for widespread rashes that itch.  The adult dosage of Benadryl is 25-50 mg by mouth 4 times daily. Caution: This type of medication may cause sleepiness.  Do not drink alcohol, drive, or operate dangerous machinery while taking antihistamines.  Do not take these medications if you have prostate enlargement.  Read the package instructions thoroughly on all medications that you take.  GET HELP RIGHT AWAY IF: Symptoms that don't go away after treatment. Severe itching that persists. You develop a fever. Your skin begins to drain. You have a sore throat. You become short of breath.  MAKE SURE YOU   Understand these instructions. Will watch your condition. Will get help right away if you are not doing well or get worse.    Thank you for choosing an e-visit.  Your e-visit answers were reviewed by a board certified advanced clinical practitioner to complete your personal care plan. Depending upon the condition, your plan could have included both over the counter or prescription medications.  Please review your pharmacy choice. Make sure the pharmacy is open so you can pick up prescription now. If there is a problem, you may contact your provider through MyChart messaging and have the prescription routed to another pharmacy.  Your safety is important to us. If you have drug allergies check your prescription carefully.   For the next 24 hours you can use MyChart to ask questions about today's visit, request a non-urgent call back, or ask for a work or school excuse. You will get an email in the next two days asking about your experience. I hope that your e-visit has been valuable and will speed your recovery.  I have spent 5 minutes in review of e-visit questionnaire, review and updating patient chart,  medical decision making and response to patient.   Mikel Hardgrove M Jami Bogdanski, PA-C  

## 2023-03-04 ENCOUNTER — Encounter: Payer: Self-pay | Admitting: Family Medicine

## 2023-03-04 ENCOUNTER — Other Ambulatory Visit: Payer: Self-pay | Admitting: Psychiatry

## 2023-03-04 DIAGNOSIS — F321 Major depressive disorder, single episode, moderate: Secondary | ICD-10-CM

## 2023-03-04 DIAGNOSIS — F411 Generalized anxiety disorder: Secondary | ICD-10-CM

## 2023-03-04 NOTE — Telephone Encounter (Signed)
The refill has been ordered as requested. Please contact the patient to schedule a follow-up visit.

## 2023-03-04 NOTE — Telephone Encounter (Signed)
She could not come to a sooner appointment.

## 2023-03-05 DIAGNOSIS — J370 Chronic laryngitis: Secondary | ICD-10-CM | POA: Diagnosis not present

## 2023-03-05 DIAGNOSIS — Z720 Tobacco use: Secondary | ICD-10-CM | POA: Diagnosis not present

## 2023-03-05 DIAGNOSIS — R490 Dysphonia: Secondary | ICD-10-CM | POA: Diagnosis not present

## 2023-03-05 DIAGNOSIS — R6889 Other general symptoms and signs: Secondary | ICD-10-CM | POA: Diagnosis not present

## 2023-03-05 DIAGNOSIS — F17210 Nicotine dependence, cigarettes, uncomplicated: Secondary | ICD-10-CM | POA: Diagnosis not present

## 2023-03-05 DIAGNOSIS — K219 Gastro-esophageal reflux disease without esophagitis: Secondary | ICD-10-CM | POA: Diagnosis not present

## 2023-03-05 DIAGNOSIS — J384 Edema of larynx: Secondary | ICD-10-CM | POA: Diagnosis not present

## 2023-03-05 DIAGNOSIS — J381 Polyp of vocal cord and larynx: Secondary | ICD-10-CM | POA: Diagnosis not present

## 2023-03-05 DIAGNOSIS — F17290 Nicotine dependence, other tobacco product, uncomplicated: Secondary | ICD-10-CM | POA: Diagnosis not present

## 2023-03-12 ENCOUNTER — Other Ambulatory Visit: Payer: Self-pay | Admitting: Psychiatry

## 2023-03-13 DIAGNOSIS — G4733 Obstructive sleep apnea (adult) (pediatric): Secondary | ICD-10-CM | POA: Diagnosis not present

## 2023-03-13 DIAGNOSIS — J449 Chronic obstructive pulmonary disease, unspecified: Secondary | ICD-10-CM | POA: Diagnosis not present

## 2023-03-13 DIAGNOSIS — R0902 Hypoxemia: Secondary | ICD-10-CM | POA: Diagnosis not present

## 2023-03-14 NOTE — Progress Notes (Signed)
BH MD/PA/NP OP Progress Note  03/21/2023 5:22 PM Rebecca Moreno  MRN:  [id number]  Chief Complaint:  Chief Complaint  Patient presents with   Follow-up   HPI:  This is a follow-up appointment for depression, anxiety.  She states that she has been doing well.  When she was asked about the concern from her daughter, she states that her daughter does not believe in what she says, and she thought she was delusional.  She was told to crazy.  She was going to a concert of "[employer]" (she shares a website of [employer]).  It was at [facility].  she states that she is now a friend, and she believes her daughter did not believe in as it was not on the schedule.  She states that this pissed her off, although she reports great relationship with her daughter otherwise.  Her mother-in-law was in nursing home early this month.  She is helping her to get meals.  She thinks things has been going well.  She sleeps up to 10 hours. The patient has mood symptoms as in PHQ-9/GAD-7.  She enjoys seeing her niece and nephew, who is 46 months old.  She feels that her mind is racing at times, and cannot slow down.  She denies increased goal-directed activities.  She denies decreased need for sleep.  She denies SI, HI, hallucinations.  She feels comfortable to come with her daughter for collateral at her next visit when schedule allows.  Of note, she is considering transferring the care to a psychiatrist in [place] so that her daughter can come together.   Wt Readings from Last 3 Encounters:  03/21/23 199 lb (90.3 kg)  12/06/22 217 lb 12.8 oz (98.8 kg)  12/03/22 213 lb (96.6 kg)     Daily routine: takes care of her mother in law Employment: on disability after TMJ surgery in 2007. 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 Her parents had marital discordance and the patient screamed when  she was a child as she did not want to hear them. She reports good relationship with both of her parents otherwise. She has one sister, who she has good relationship with     Substance use   Tobacco Alcohol Other substances/  Current Quit since Dec 2023 denies denies  Past   denies denies  Past Treatment           Visit Diagnosis:    ICD-10-CM   1. MDD (major depressive disorder), recurrent episode, moderate (HCC)  F33.1     2. Anxiety disorder, unspecified type  F41.9     3. High risk medication use  Z79.899       Past Psychiatric History: Please see initial evaluation for full details. I have reviewed the history. No updates at this time.     Past Medical History:  Past Medical History:  Diagnosis Date   Acute pyelonephritis    Asthma    Bipolar disorder (HCC)    Colon polyp    Complication of anesthesia    "sometimes hard to put to sleep"   Depression    Bipolar   Gastric ulcer    around 2013.  stress related   GERD (gastroesophageal reflux disease)    History of kidney stones    Hyperlipidemia    Hypothyroid    Polyp, uterus corpus  Sepsis due to Escherichia coli (E. coli) (HCC) 09/04/2018   Sleep apnea    no cpap used   Stroke [facility])     Past Surgical History:  Procedure Laterality Date   ABLATION ON ENDOMETRIOSIS     COLONOSCOPY     Per patient, done around 2013 in [place], had polyp and overdue for follow up.   COLONOSCOPY WITH PROPOFOL N/A 05/12/2018   Procedure: COLONOSCOPY WITH PROPOFOL;  Surgeon: Corbin Ade, MD;  Location: [facility];  Service: Endoscopy;  Laterality: N/A;  12:00pm   CYSTOSCOPY W/ URETERAL STENT PLACEMENT Left 09/01/2018   Procedure: CYSTOSCOPY WITH RETROGRADE PYELOGRAM/URETERAL STENT PLACEMENT;  Surgeon: Rene Paci, MD;  Location: [facility];  Service: Urology;  Laterality: Left;   CYSTOSCOPY/URETEROSCOPY/HOLMIUM LASER/STENT PLACEMENT Left 09/17/2018   Procedure: CYSTOSCOPY/URETEROSCOPY/HOLMIUM LASER/STENT  PLACEMENT;  Surgeon: Rene Paci, MD;  Location: [facility];  Service: Urology;  Laterality: Left;  ONLY NEEDS 45 MIN   OVARIAN CYST SURGERY Left    POLYPECTOMY  05/12/2018   Procedure: POLYPECTOMY;  Surgeon: Corbin Ade, MD;  Location: [facility];  Service: Endoscopy;;  polyp at splenic flexure   TEMPOROMANDIBULAR JOINT SURGERY     9 surgeries   TUBAL LIGATION     vaginal childbirth     1    Family Psychiatric History: Please see initial evaluation for full details. I have reviewed the history. No updates at this time.     Family History:  Family History  Problem Relation Age of Onset   Anxiety disorder Mother    Hypertension Mother    Heart failure Mother    Stroke Mother    Hyperlipidemia Father    Diabetes Father    Stroke Father    Hypertension Father    Colon polyps Father        older than 33   Diabetes Sister    Hypertension Sister    Ovarian cancer Sister    Ovarian cancer Maternal Grandmother    Colon cancer Maternal Grandmother    Renal Disease Paternal Grandmother    Heart attack Paternal Grandfather    Asthma Daughter    Bipolar disorder Other    Asthma Niece     Social History:  Social History   Socioeconomic History   Marital status: Married    Spouse name: Not on file   Number of children: 1   Years of education: Not on file   Highest education level: High school graduate  Occupational History   Occupation: disability  Tobacco Use   Smoking status: Every Day    Current packs/day: 0.50    Average packs/day: 0.5 packs/day for 30.0 years (15.0 ttl pk-yrs)    Types: Cigarettes   Smokeless tobacco: Never   Tobacco comments:    Is smoking about 0.5 of a pack and has a patch placed on   Vaping Use   Vaping status: Never Used  Substance and Sexual Activity   Alcohol use: Never   Drug use: Not Currently   Sexual activity: Yes    Partners: Male    Birth control/protection: Surgical    Comment: tubal  Other Topics Concern   Not  on file  Social History Narrative   Not on file   Social Determinants of Health   Financial Resource Strain: Low Risk  (07/16/2022)   Overall Financial Resource Strain (CARDIA)    Difficulty of Paying Living Expenses: Not very hard  Food Insecurity: Food Insecurity Present (07/16/2022)   Hunger  Vital Sign    Worried About Programme researcher, broadcasting/film/video in the Last Year: Sometimes true    Ran Out of Food in the Last Year: Sometimes true  Transportation Needs: No Transportation Needs (07/16/2022)   PRAPARE - Administrator, Civil Service (Medical): No    Lack of Transportation (Non-Medical): No  Physical Activity: Inactive (07/16/2022)   Exercise Vital Sign    Days of Exercise per Week: 0 days    Minutes of Exercise per Session: 0 min  Stress: No Stress Concern Present (07/16/2022)   [employer] of Occupational Health - Occupational Stress Questionnaire    Feeling of Stress : Not at all  Social Connections: Moderately Isolated (07/16/2022)   Social Connection and Isolation Panel [NHANES]    Frequency of Communication with Friends and Family: More than three times a week    Frequency of Social Gatherings with Friends and Family: More than three times a week    Attends Religious Services: Never    Database administrator or Organizations: No    Attends Banker Meetings: Never    Marital Status: Married    Allergies:  Allergies  Allergen Reactions   Lithium Other (See Comments)    Psoriasis     Metabolic Disorder Labs: Lab Results  Component Value Date   HGBA1C 6.2 (H) 10/29/2022   No results found for: "PROLACTIN" Lab Results  Component Value Date   CHOL 212 (H) 12/18/2018   TRIG 171 (H) 12/18/2018   HDL 44 12/18/2018   CHOLHDL 4.8 (H) 12/18/2018   LDLCALC 134 (H) 12/18/2018   LDLCALC 168 (H) 01/15/2018   Lab Results  Component Value Date   TSH 0.939 10/29/2022   TSH 1.100 09/04/2021    Therapeutic Level Labs: No results found for: "LITHIUM" No  results found for: "VALPROATE" No results found for: "CBMZ"  Current Medications: Current Outpatient Medications  Medication Sig Dispense Refill   albuterol (VENTOLIN HFA) 108 (90 Base) MCG/ACT inhaler Inhale 1 puff into the lungs every 6 (six) hours as needed for wheezing or shortness of breath. 6.7 g 1   atorvastatin (LIPITOR) 80 MG tablet TAKE ONE TABLET AT BEDTIME 100 tablet 0   betamethasone valerate ointment (VALISONE) 0.1 % APPLY TOPICALLY 2 TIMES A DAY TO PSORIASIS AS NEEDED FOR FLARE UPS 45 g 1   CALCIUM CITRATE-VITAMIN D3 PO Take 1 tablet by mouth daily.     Cholecalciferol (VITAMIN D3) 5000 units TABS Take 5,000 Units by mouth daily.      Dexlansoprazole (DEXILANT) 30 MG capsule DR TAKE (1) CAPSULE DAILY 90 capsule 3   famotidine (PEPCID) 20 MG tablet TAKE ONE TABLET BY MOUTH EVERY DAY 90 tablet 0   FLOVENT HFA 220 MCG/ACT inhaler INHALE 2 PUFFS TWICE DAILY 12 g 0   fluticasone (FLONASE) 50 MCG/ACT nasal spray SPRAY ONE SPRAY IN EACH NOSTRIL DAILY AT night TO help with morning congestion 16 g 5   gabapentin (NEURONTIN) 300 MG capsule Take 1 capsule at supper and 1 capsule at bedtime.  Further fills per Chriss Czar, Neurology 180 capsule 0   lamoTRIgine (LAMICTAL) 100 MG tablet Take 3 tablets (300 mg total) by mouth at bedtime. 270 tablet 1   levothyroxine (SYNTHROID) 50 MCG tablet TAKE ONE TABLET DAILY 90 tablet 2   loratadine (CLARITIN) 10 MG tablet TAKE 1 TABLET DAILY 30 tablet 5   magnesium gluconate (MAGONATE) 500 MG tablet Take 500 mg by mouth daily.     Multiple Vitamins-Minerals (  MULTIVITAMIN WOMEN PO) Take 1 tablet by mouth daily.     tiZANidine (ZANAFLEX) 4 MG tablet Take 0.5-1 tablets (2-4 mg total) by mouth every 8 (eight) hours as needed for muscle spasms. 60 tablet 0   triamcinolone cream (KENALOG) 0.1 % Apply 1 Application topically 2 (two) times daily. 80 g 0   venlafaxine XR (EFFEXOR-XR) 150 MG 24 hr capsule Take 2 capsules (300 mg total) by mouth daily. 180 capsule  1   [START ON 04/11/2023] Cariprazine HCl (VRAYLAR) 6 MG CAPS Take 1 capsule (6 mg total) by mouth daily. 90 capsule 1   [START ON 04/16/2023] clonazePAM (KLONOPIN) 1 MG tablet Take 1 tablet (1 mg total) by mouth 2 (two) times daily. 60 tablet 1   [START ON 04/03/2023] QUEtiapine (SEROQUEL) 100 MG tablet Take 1 tablet (100 mg total) by mouth at bedtime. 90 tablet 0   No current facility-administered medications for this visit.     Musculoskeletal: Strength & Muscle Tone:  normal Gait & Station: normal Patient leans: N/A  Psychiatric Specialty Exam: Review of Systems  Psychiatric/Behavioral:  Positive for dysphoric mood. Negative for agitation, behavioral problems, confusion, decreased concentration, hallucinations, self-injury, sleep disturbance and suicidal ideas. The patient is nervous/anxious. The patient is not hyperactive.   All other systems reviewed and are negative.   Blood pressure 139/85, pulse 80, temperature (!) 95.6 F (35.3 C), temperature source Skin, height 5\' 6"  (1.676 m), weight 199 lb (90.3 kg), last menstrual period 12/13/2017.Body mass index is 32.12 kg/m.  General Appearance: Well Groomed  Eye Contact:  Good  Speech:   hoarse voice, not pressured, although slightly verbose  Volume:  Normal  Mood:   good  Affect:  Appropriate, Congruent, and Full Range  Thought Process:  Coherent  Orientation:  Full (Time, Place, and Person)  Thought Content: Logical   Suicidal Thoughts:  No  Homicidal Thoughts:  No  Memory:  Immediate;   Good  Judgement:  Good  Insight:  Fair  Psychomotor Activity:  Normal, Normal tone, no rigidity, no resting/postural tremors, no tardive dyskinesia    Concentration:  Concentration: Good and Attention Span: Good  Recall:  Good  Fund of Knowledge: Good  Language: Good  Akathisia:  No  Handed:  Right  AIMS (if indicated): not done  Assets:  Communication Skills Desire for Improvement  ADL's:  Intact  Cognition: WNL  Sleep:  Good    Screenings: GAD-7    Flowsheet Row Office Visit from 03/21/2023 in [facility] Office Visit from 10/29/2022 in [facility] Office Visit from 06/05/2022 in [facility] Office Visit from 03/20/2022 in [facility] Office Visit from 02/21/2022 in [facility]  Total GAD-7 Score 7 4 10 13  0      Mini-Mental    Flowsheet Row Office Visit from 08/28/2021 in [facility] Clinical Support from 07/22/2018 in [facility]  Total Score (max 30 points ) 29 30      PHQ2-9    Flowsheet Row [facility] Visit [facility]21/20[facility] in [facility] Office Visit from 10/29/2022 in [facility] Clinical Support from 07/16/2022 in [facility] Office Visit from 06/05/2022 in [facility] Office Visit from 03/20/2022 in [facility]  PHQ-2 Total Score 3 2 0  1 1  PHQ-9 Total Score 6 10 -- 3 6      Flowsheet Row Office Visit from 10/19/2021 in [facility] Video Visit from 11/15/2020 in [facility] Video Visit from 08/18/2020 in [facility]  C-SSRS RISK CATEGORY No Risk No Risk No Risk        Assessment and Plan:  ADORE THURGOOD is a 57 y.o. year old female with a history of  depression, hypothyroidism, obstructive sleep apnea (CPAP machine), stroke, Hyperlipidemia, Hypertension, stroke, who presents for follow up appointment for below.   1. MDD (major depressive disorder), recurrent episode, moderate (HCC) 2. Anxiety disorder, unspecified type # r/o schizoaffective disorder # Bipolar  I disorder by history Acute stressors include:  Other stressors include: marital conflict, caregiver of her mother in law    History: tx from [place]. Dx bipolar I disorder, admitted four times for being delusional, last in 2007. No SA. Never experienced mania. history of paranoia and irritability, VH in the context of depression. Originally on venlafaxine 300 mg daily, lamotrigine 300 mg daily, Vraylar 6 mg, quetiapine 300 mg at night, clonazepam 1 mg BID   Exam is notable for calm demeanor, and she reports self limited depressed mood and anxiety, although she denies psychotic symptoms.  Her daughter is concerned about a worsening of delusion in relation to tapering down quetiapine. Quetiapine was up titrated to original dose due to address this concern. Differential includes schizoaffective disorder. It is noted that she was initially prescribed both quetiapine and Vraylar when first seen by this provider, diagnosed with bipolar I disorder by her previous provider. Both the patient and her husband report a history of paranoia, irritability, and visual hallucinations during episodes of depression in the past. It remains unclear whether she experienced manic symptoms. Despite attempts, records from her previous psychiatrist were not obtained.  Will continue venlafaxine, lamotrigine to target depression.  Will continue Vraylar and quetiapine at this time given recent concerning episode. Will continue clonazepam prn for anxiety. She agrees to come with her daughter at her next visit if possible.   # benzodiazepine dependence She has been on the current dose of clonazepam since the initial visit with this writer without obvious concern of overusing this medication.  She declined to taper down this medication.  Although the hope was to taper off quetiapine first, she had concerning episode of delusion.  It is likely not the best timing to taper down clonazepam at this time, although the hope is to taper off this  medication in the future to avoid long term side effect.     3. High risk medication use She was advised to obtain UDS given she is on clonazepam.   Plan Continue venlafaxine 300 mg daily Continue Lamotrigine 300 mg daily Continue Vraylar 6 mg daily (Qtc 410 msec, hr 83, 10/2022) Continue quetiapine 100 mg at night (episode of delusion when tapering down to 50 mg) Continue clonazepam 1 mg twice a day as needed for anxiety  Obtain urine screening - she would like Dr. Nadine Counts to order this Next appointment: 1/14 at 4 PM., IP  She will continue to see this Clinical research associate, although transfer to [place] was attempted in the past.     Past trials of medication: sertraline, fluoxetine, lexapro, Celexa, duloxetine, Wellbutrin  lithium (psoriasis), Depakote (did not work), olanzapine, Abilify (weight gain), latuda, Geodon    The patient demonstrates the following risk factors for suicide: Chronic  risk factors for suicide include: psychiatric disorder of depression. Acute risk factors for suicide include: family or marital conflict and unemployment. Protective factors for this patient include: positive social support and hope for the future. Considering these factors, the overall suicide risk at this point appears to be low. Patient is appropriate for outpatient follow up.    Collaboration of Care: Collaboration of Care: Other reviewed notes in Epic  Patient/Guardian was advised Release of Information must be obtained prior to any record release in order to collaborate their care with an outside provider. Patient/Guardian was advised if they have not already done so to contact the registration department to sign all necessary forms in order for [place] to release information regarding their care.   Consent: Patient/Guardian gives verbal consent for treatment and assignment of benefits for services provided during this visit. Patient/Guardian expressed understanding and agreed to proceed.    Neysa Hotter,  MD 03/21/2023, 5:22 PM

## 2023-03-16 ENCOUNTER — Other Ambulatory Visit: Payer: Self-pay | Admitting: Psychiatry

## 2023-03-21 ENCOUNTER — Ambulatory Visit (INDEPENDENT_AMBULATORY_CARE_PROVIDER_SITE_OTHER): Payer: 59 | Admitting: Psychiatry

## 2023-03-21 ENCOUNTER — Encounter: Payer: Self-pay | Admitting: Psychiatry

## 2023-03-21 VITALS — BP 139/85 | HR 80 | Temp 95.6°F | Ht 66.0 in | Wt 199.0 lb

## 2023-03-21 DIAGNOSIS — F419 Anxiety disorder, unspecified: Secondary | ICD-10-CM

## 2023-03-21 DIAGNOSIS — Z79899 Other long term (current) drug therapy: Secondary | ICD-10-CM

## 2023-03-21 DIAGNOSIS — F331 Major depressive disorder, recurrent, moderate: Secondary | ICD-10-CM | POA: Diagnosis not present

## 2023-03-21 MED ORDER — VRAYLAR 6 MG PO CAPS: 6.0000 mg | ORAL_CAPSULE | Freq: Every day | ORAL | 1 refills | Status: AC

## 2023-03-21 MED ORDER — QUETIAPINE FUMARATE 100 MG PO TABS: 100.0000 mg | ORAL_TABLET | Freq: Every day | ORAL | 0 refills | Status: DC

## 2023-03-21 MED ORDER — CLONAZEPAM 1 MG PO TABS: 1.0000 mg | ORAL_TABLET | Freq: Two times a day (BID) | ORAL | 1 refills | Status: DC

## 2023-03-21 NOTE — Patient Instructions (Signed)
Continue venlafaxine 300 mg daily Continue Lamotrigine 300 mg daily Continue Vraylar 6 mg daily  Continue quetiapine 100 mg at night  Continue clonazepam 1 mg twice a day as needed for anxiety  Obtain urine screening  Next appointment: 1/14 at 4 PM

## 2023-03-26 ENCOUNTER — Telehealth: Payer: Self-pay

## 2023-03-26 ENCOUNTER — Other Ambulatory Visit: Payer: Self-pay | Admitting: Psychiatry

## 2023-03-26 ENCOUNTER — Other Ambulatory Visit: Payer: 59

## 2023-03-26 ENCOUNTER — Ambulatory Visit (INDEPENDENT_AMBULATORY_CARE_PROVIDER_SITE_OTHER): Payer: 59

## 2023-03-26 DIAGNOSIS — Z23 Encounter for immunization: Secondary | ICD-10-CM | POA: Diagnosis not present

## 2023-03-26 DIAGNOSIS — Z79899 Other long term (current) drug therapy: Secondary | ICD-10-CM | POA: Diagnosis not present

## 2023-03-26 MED ORDER — QUETIAPINE FUMARATE 150 MG PO TABS: 150.0000 mg | ORAL_TABLET | Freq: Every day | ORAL | 0 refills | Status: DC

## 2023-03-26 NOTE — Telephone Encounter (Signed)
Ordered. Please contact [facility], and cancel quetiapine 100 mg tabs.

## 2023-03-26 NOTE — Telephone Encounter (Signed)
pt called states that she needs a increase in the quetiapine. she states that she has not seen any difference . pt was last seen on 11-14 next appt 1-14

## 2023-03-26 NOTE — Telephone Encounter (Signed)
called pt she states she can not take a pill and a half that her medication is in pill pack so a new rx will need to be sent in so that they can change the pill pack.

## 2023-03-27 NOTE — Telephone Encounter (Signed)
left message that rx was sent to the pharmacy  

## 2023-03-28 ENCOUNTER — Telehealth: Payer: Self-pay | Admitting: Family Medicine

## 2023-03-28 NOTE — Telephone Encounter (Signed)
R/C to Patient - Made her aware that Dr. Adrian Blackwater was also at [facility] and he does see Adult Patient's - Patient voiced understanding.

## 2023-03-28 NOTE — Telephone Encounter (Unsigned)
Copied from CRM [id number]. Topic: Referral - Question >> Mar 28, 2023  1:45 PM Rebecca Moreno wrote: Reason for CRM: PT stated that she needs Psych Referral due to the previous one being no good. Dr. Tenny Craw is no longer seeing adult patients. Please refer elsewhere. [phone] is a good cb #.

## 2023-03-29 ENCOUNTER — Encounter: Payer: Self-pay | Admitting: Psychiatry

## 2023-03-29 LAB — URINE DRUG PANEL 7
Amphetamines, Urine: NEGATIVE ng/mL
Barbiturate Quant, Ur: NEGATIVE ng/mL
Benzodiazepine Quant, Ur: NEGATIVE ng/mL
Cannabinoid Quant, Ur: POSITIVE — AB
Cocaine (Metab.): NEGATIVE ng/mL
Opiate Quant, Ur: NEGATIVE ng/mL
PCP Quant, Ur: NEGATIVE ng/mL

## 2023-04-01 ENCOUNTER — Telehealth: Payer: Self-pay

## 2023-04-01 NOTE — Telephone Encounter (Signed)
pt called and wanted results of labwork. pt was told that a letter was mailed out to her with the results also but pt was given the result and instructions that was given in the letter.

## 2023-04-08 ENCOUNTER — Telehealth: Payer: Self-pay

## 2023-04-08 NOTE — Patient Outreach (Signed)
[facility] Assistant attempted to call patient on today regarding preventative mammogram screening. No answer from patient after multiple rings. Assistant left confidential voicemail for patient to return call.  Will call back patient back for final attempt.   Baruch Gouty [place]/VBCI  [facility] Assistant-Population Health [phone]

## 2023-04-12 DIAGNOSIS — J449 Chronic obstructive pulmonary disease, unspecified: Secondary | ICD-10-CM | POA: Diagnosis not present

## 2023-04-12 DIAGNOSIS — G4733 Obstructive sleep apnea (adult) (pediatric): Secondary | ICD-10-CM | POA: Diagnosis not present

## 2023-04-12 DIAGNOSIS — R0902 Hypoxemia: Secondary | ICD-10-CM | POA: Diagnosis not present

## 2023-04-15 ENCOUNTER — Telehealth: Payer: Self-pay | Admitting: Neurology

## 2023-04-15 NOTE — Telephone Encounter (Signed)
Pt called to cancel Sleep Consult transferred to Sleep Lab coordinator.

## 2023-04-16 ENCOUNTER — Institutional Professional Consult (permissible substitution): Payer: 59 | Admitting: Neurology

## 2023-04-18 DIAGNOSIS — Z720 Tobacco use: Secondary | ICD-10-CM | POA: Diagnosis not present

## 2023-04-18 DIAGNOSIS — R490 Dysphonia: Secondary | ICD-10-CM | POA: Diagnosis not present

## 2023-04-18 DIAGNOSIS — K219 Gastro-esophageal reflux disease without esophagitis: Secondary | ICD-10-CM | POA: Diagnosis not present

## 2023-04-18 DIAGNOSIS — J384 Edema of larynx: Secondary | ICD-10-CM | POA: Diagnosis not present

## 2023-04-18 DIAGNOSIS — J370 Chronic laryngitis: Secondary | ICD-10-CM | POA: Diagnosis not present

## 2023-04-18 DIAGNOSIS — R6889 Other general symptoms and signs: Secondary | ICD-10-CM | POA: Diagnosis not present

## 2023-04-18 DIAGNOSIS — R0989 Other specified symptoms and signs involving the circulatory and respiratory systems: Secondary | ICD-10-CM | POA: Diagnosis not present

## 2023-04-18 DIAGNOSIS — J381 Polyp of vocal cord and larynx: Secondary | ICD-10-CM | POA: Diagnosis not present

## 2023-04-18 DIAGNOSIS — F17290 Nicotine dependence, other tobacco product, uncomplicated: Secondary | ICD-10-CM | POA: Diagnosis not present

## 2023-04-18 DIAGNOSIS — F17210 Nicotine dependence, cigarettes, uncomplicated: Secondary | ICD-10-CM | POA: Diagnosis not present

## 2023-04-23 ENCOUNTER — Encounter (INDEPENDENT_AMBULATORY_CARE_PROVIDER_SITE_OTHER): Payer: Self-pay | Admitting: *Deleted

## 2023-04-25 ENCOUNTER — Other Ambulatory Visit: Payer: Self-pay | Admitting: Family Medicine

## 2023-05-09 DIAGNOSIS — J381 Polyp of vocal cord and larynx: Secondary | ICD-10-CM | POA: Diagnosis not present

## 2023-05-09 DIAGNOSIS — K219 Gastro-esophageal reflux disease without esophagitis: Secondary | ICD-10-CM | POA: Diagnosis not present

## 2023-05-09 DIAGNOSIS — R490 Dysphonia: Secondary | ICD-10-CM | POA: Diagnosis not present

## 2023-05-09 DIAGNOSIS — R6889 Other general symptoms and signs: Secondary | ICD-10-CM | POA: Diagnosis not present

## 2023-05-09 DIAGNOSIS — F17290 Nicotine dependence, other tobacco product, uncomplicated: Secondary | ICD-10-CM | POA: Diagnosis not present

## 2023-05-09 DIAGNOSIS — Z720 Tobacco use: Secondary | ICD-10-CM | POA: Diagnosis not present

## 2023-05-09 DIAGNOSIS — J370 Chronic laryngitis: Secondary | ICD-10-CM | POA: Diagnosis not present

## 2023-05-09 DIAGNOSIS — R0989 Other specified symptoms and signs involving the circulatory and respiratory systems: Secondary | ICD-10-CM | POA: Diagnosis not present

## 2023-05-09 DIAGNOSIS — J384 Edema of larynx: Secondary | ICD-10-CM | POA: Diagnosis not present

## 2023-05-09 DIAGNOSIS — F17210 Nicotine dependence, cigarettes, uncomplicated: Secondary | ICD-10-CM | POA: Diagnosis not present

## 2023-05-13 DIAGNOSIS — G4733 Obstructive sleep apnea (adult) (pediatric): Secondary | ICD-10-CM | POA: Diagnosis not present

## 2023-05-13 DIAGNOSIS — R0902 Hypoxemia: Secondary | ICD-10-CM | POA: Diagnosis not present

## 2023-05-13 DIAGNOSIS — J449 Chronic obstructive pulmonary disease, unspecified: Secondary | ICD-10-CM | POA: Diagnosis not present

## 2023-05-14 ENCOUNTER — Other Ambulatory Visit: Payer: Self-pay | Admitting: Psychiatry

## 2023-05-18 ENCOUNTER — Other Ambulatory Visit: Payer: Self-pay | Admitting: Allergy & Immunology

## 2023-05-18 DIAGNOSIS — J301 Allergic rhinitis due to pollen: Secondary | ICD-10-CM

## 2023-05-18 NOTE — Progress Notes (Deleted)
 BH MD/PA/NP OP Progress Note  05/18/2023 3:43 PM Rebecca Moreno  MRN:  [id number]  Chief Complaint: No chief complaint on file.  HPI: ***    Daily routine: takes care of her mother in law Employment: on disability after TMJ surgery in 2007. 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 Her parents had marital discordance and the patient screamed when she was a child as she did not want to hear them. She reports good relationship with both of her parents otherwise. She has one sister, who she has good relationship with  Visit Diagnosis: No diagnosis found.  Past Psychiatric History: Please see initial evaluation for full details. I have reviewed the history. No updates at this time.     Past Medical History:  Past Medical History:  Diagnosis Date   Acute pyelonephritis    Asthma    Bipolar disorder (HCC)    Colon polyp    Complication of anesthesia    sometimes hard to put to sleep   Depression    Bipolar   Gastric ulcer    around 2013.  stress related   GERD (gastroesophageal reflux disease)    History of kidney stones    Hyperlipidemia    Hypothyroid    Polyp, uterus corpus    Sepsis due to Escherichia coli (E. coli) (HCC) 09/04/2018   Sleep apnea    no cpap used   Stroke [facility])     Past Surgical History:  Procedure Laterality Date   ABLATION ON ENDOMETRIOSIS     COLONOSCOPY     Per patient, done around 2013 in [place] , had polyp and overdue for follow up.   COLONOSCOPY WITH PROPOFOL  N/A 05/12/2018   Procedure: COLONOSCOPY WITH PROPOFOL ;  Surgeon: Shaaron Lamar HERO, MD;  [facility];  Service: Endoscopy;  Laterality: N/A;  12:00pm   CYSTOSCOPY W/ URETERAL STENT PLACEMENT Left 09/01/2018   Procedure: CYSTOSCOPY WITH RETROGRADE PYELOGRAM/URETERAL STENT PLACEMENT;  Surgeon: Devere Lonni Righter, MD;  Location: [facility] OR[facility];  Service:  Urology;  Laterality: Left;   CYSTOSCOPY/URETEROSCOPY/HOLMIUM LASER/STENT PLACEMENT Left 09/17/2018   Procedure: CYSTOSCOPY/URETEROSCOPY/HOLMIUM LASER/STENT PLACEMENT;  Surgeon: Devere Lonni Righter, MD;  Location: [facility];  Service: Urology;  Laterality: Left;  ONLY NEEDS 45 MIN   OVARIAN CYST SURGERY Left    POLYPECTOMY  05/12/2018   Procedure: POLYPECTOMY;  Surgeon: Shaaron Lamar HERO, MD;  [facility];  Service: Endoscopy;;  polyp at splenic flexure   TEMPOROMANDIBULAR JOINT SURGERY     9 surgeries   TUBAL LIGATION     vaginal childbirth     1    Family Psychiatric History: Please see initial evaluation for full details. I have reviewed the history. No updates at this time.     Family History:  Family History  Problem Relation Age of Onset   Anxiety disorder Mother    Hypertension Mother    Heart failure Mother    Stroke Mother    Hyperlipidemia Father    Diabetes Father    Stroke Father    Hypertension Father    Colon polyps Father        older than 60   Diabetes Sister    Hypertension Sister    Ovarian cancer Sister    Ovarian cancer Maternal Grandmother    Colon cancer Maternal Grandmother    Renal Disease Paternal Grandmother  Heart attack Paternal Grandfather    Asthma Daughter    Bipolar disorder Other    Asthma Niece     Social History:  Social History   Socioeconomic History   Marital status: Married    Spouse name: Not on file   Number of children: 1   Years of education: Not on file   Highest education level: High school graduate  Occupational History   Occupation: disability  Tobacco Use   Smoking status: Every Day    Current packs/day: 0.50    Average packs/day: 0.5 packs/day for 30.0 years (15.0 ttl pk-yrs)    Types: Cigarettes   Smokeless tobacco: Never   Tobacco comments:    Is smoking about 0.5 of a pack and has a patch placed on   Vaping Use   Vaping status: Never Used  Substance and Sexual Activity   Alcohol use: Never    Drug use: Not Currently   Sexual activity: Yes    Partners: Male    Birth control/protection: Surgical    Comment: tubal  Other Topics Concern   Not on file  Social History Narrative   Not on file   Social Drivers of Health   Financial Resource Strain: Low Risk  (07/16/2022)   Overall Financial Resource Strain (CARDIA)    Difficulty of Paying Living Expenses: Not very hard  Food Insecurity: Food Insecurity Present (07/16/2022)   Hunger Vital Sign    Worried About Running Out of Food in the Last Year: Sometimes true    Ran Out of Food in the Last Year: Sometimes true  Transportation Needs: No Transportation Needs (07/16/2022)   PRAPARE - Administrator, Civil Service (Medical): No    Lack of Transportation (Non-Medical): No  Physical Activity: Inactive (07/16/2022)   Exercise Vital Sign    Days of Exercise per Week: 0 days    Minutes of Exercise per Session: 0 min  Stress: No Stress Concern Present (07/16/2022)   [employer] of Occupational Health - Occupational Stress Questionnaire    Feeling of Stress : Not at all  Social Connections: Moderately Isolated (07/16/2022)   Social Connection and Isolation Panel [NHANES]    Frequency of Communication with Friends and Family: More than three times a week    Frequency of Social Gatherings with Friends and Family: More than three times a week    Attends Religious Services: Never    Database Administrator or Organizations: No    Attends Banker Meetings: Never    Marital Status: Married    Allergies:  Allergies  Allergen Reactions   Lithium Other (See Comments)    Psoriasis     Metabolic Disorder Labs: Lab Results  Component Value Date   HGBA1C 6.2 (H) 10/29/2022   No results found for: PROLACTIN Lab Results  Component Value Date   CHOL 212 (H) 12/18/2018   TRIG 171 (H) 12/18/2018   HDL 44 12/18/2018   CHOLHDL 4.8 (H) 12/18/2018   LDLCALC 134 (H) 12/18/2018   LDLCALC 168 (H) 01/15/2018    Lab Results  Component Value Date   TSH 0.939 10/29/2022   TSH 1.100 09/04/2021    Therapeutic Level Labs: No results found for: LITHIUM No results found for: VALPROATE No results found for: CBMZ  Current Medications: Current Outpatient Medications  Medication Sig Dispense Refill   albuterol  (VENTOLIN  HFA) 108 (90 Base) MCG/ACT inhaler Inhale 1 puff into the lungs every 6 (six) hours as needed for wheezing or shortness  of breath. 6.7 g 1   atorvastatin  (LIPITOR) 80 MG tablet TAKE ONE TABLET AT BEDTIME 100 tablet 0   betamethasone  valerate ointment (VALISONE ) 0.1 % APPLY TOPICALLY 2 TIMES A DAY TO PSORIASIS AS NEEDED FOR FLARE UPS 45 g 1   CALCIUM  CITRATE-VITAMIN D3 PO Take 1 tablet by mouth daily.     Cariprazine  HCl (VRAYLAR ) 6 MG CAPS Take 1 capsule (6 mg total) by mouth daily. 90 capsule 1   Cholecalciferol (VITAMIN D3) 5000 units TABS Take 5,000 Units by mouth daily.      clonazePAM  (KLONOPIN ) 1 MG tablet Take 1 tablet (1 mg total) by mouth 2 (two) times daily. 60 tablet 1   Dexlansoprazole  (DEXILANT ) 30 MG capsule DR TAKE (1) CAPSULE DAILY 90 capsule 3   famotidine  (PEPCID ) 20 MG tablet TAKE ONE TABLET BY MOUTH EVERY DAY 90 tablet 0   FLOVENT  HFA 220 MCG/ACT inhaler INHALE 2 PUFFS TWICE DAILY 12 g 0   fluticasone  (FLONASE ) 50 MCG/ACT nasal spray SPRAY ONE SPRAY IN EACH NOSTRIL DAILY AT night TO help with morning congestion 16 g 5   gabapentin  (NEURONTIN ) 300 MG capsule Take 1 capsule at supper and 1 capsule at bedtime.  Further fills per Francis Pinal, Neurology 180 capsule 0   [START ON 05/21/2023] lamoTRIgine  (LAMICTAL ) 100 MG tablet Take 3 tablets (300 mg total) by mouth at bedtime. 270 tablet 1   levothyroxine  (SYNTHROID ) 50 MCG tablet TAKE ONE TABLET DAILY 90 tablet 2   loratadine  (CLARITIN ) 10 MG tablet TAKE 1 TABLET DAILY 30 tablet 5   magnesium gluconate (MAGONATE) 500 MG tablet Take 500 mg by mouth daily.     Multiple Vitamins-Minerals (MULTIVITAMIN WOMEN PO) Take  1 tablet by mouth daily.     QUEtiapine  Fumarate 150 MG TABS Take 150 mg by mouth at bedtime. 90 tablet 0   tiZANidine  (ZANAFLEX ) 4 MG tablet Take 0.5-1 tablets (2-4 mg total) by mouth every 8 (eight) hours as needed for muscle spasms. 60 tablet 0   triamcinolone  cream (KENALOG ) 0.1 % Apply 1 Application topically 2 (two) times daily. 80 g 0   [START ON 05/25/2023] venlafaxine  XR (EFFEXOR -XR) 150 MG 24 hr capsule Take 2 capsules (300 mg total) by mouth daily. 180 capsule 1   No current facility-administered medications for this visit.     Musculoskeletal: Strength & Muscle Tone: within normal limits Gait & Station: normal Patient leans: N/A  Psychiatric Specialty Exam: Review of Systems  Last menstrual period 12/13/2017.There is no height or weight on file to calculate BMI.  General Appearance: {Appearance:22683}  Eye Contact:  {BHH EYE CONTACT:22684}  Speech:  Clear and Coherent  Volume:  Normal  Mood:  {BHH MOOD:22306}  Affect:  {Affect (PAA):22687}  Thought Process:  Coherent  Orientation:  Full (Time, Place, and Person)  Thought Content: Logical   Suicidal Thoughts:  {ST/HT (PAA):22692}  Homicidal Thoughts:  {ST/HT (PAA):22692}  Memory:  Immediate;   Good  Judgement:  {Judgement (PAA):22694}  Insight:  {Insight (PAA):22695}  Psychomotor Activity:  Normal  Concentration:  Concentration: Good and Attention Span: Good  Recall:  Good  Fund of Knowledge: Good  Language: Good  Akathisia:  No  Handed:  Right  AIMS (if indicated): not done  Assets:  Communication Skills Desire for Improvement  ADL's:  Intact  Cognition: WNL  Sleep:  {BHH GOOD/FAIR/POOR:22877}   Screenings: GAD-7    Flowsheet Row Office Visit from 03/21/2023 in [facility] Office Visit from 10/29/2022 in [facility] Office Visit from 06/05/2022 in [facility] Office Visit from 03/20/2022 in [facility] Office Visit from 02/21/2022 in [facility]  Total GAD-7 Score 7 4 10 13  0      Mini-Mental    Flowsheet Row Office Visit from 08/28/2021 in [facility] Clinical Support from 07/22/2018 in [facility]  Total Score (max 30 points ) 29 30      PHQ2-9    Flowsheet Row Office Visit from 03/21/2023 in [facility] Office Visit from 10/29/2022 in [facility] Clinical Support from 07/16/2022 in [facility] Office Visit from 06/05/2022 in [facility] Office Visit from 03/20/2022 in [facility]  PHQ-2 Total Score 3 2 0 1 1  PHQ-9 Total Score 6 10 -- 3 6      Flowsheet Row Office Visit from 10/19/2021 in [facility] Video Visit from 11/15/2020 in [facility] Video Visit from 08/18/2020 in [facility]  C-SSRS RISK CATEGORY No Risk No Risk No Risk        Assessment and Plan:  Rebecca Moreno is a 58 y.o. year old female with a history of  depression, hypothyroidism, obstructive sleep apnea (CPAP machine), stroke, Hyperlipidemia, Hypertension, stroke, who presents for follow up appointment for below.    1. MDD (major depressive disorder), recurrent episode, moderate (HCC) 2. Anxiety disorder, unspecified type # r/o schizoaffective disorder # Bipolar I disorder by history Acute stressors include:  Other stressors include: marital conflict, caregiver of her mother in law    History: tx from [place]. Dx bipolar I disorder, admitted four times for being delusional, last in 2007. No SA. Never experienced mania. history of paranoia and irritability, VH in the context of  depression. Originally on venlafaxine  300 mg daily, lamotrigine  300 mg daily, Vraylar  6 mg, quetiapine  300 mg at night, clonazepam  1 mg BID   Exam is notable for calm demeanor, and she reports self limited depressed mood and anxiety, although she denies psychotic symptoms.  Her daughter is concerned about a worsening of delusion in relation to tapering down quetiapine . Quetiapine  was up titrated to original dose due to address this concern. Differential includes schizoaffective disorder. It is noted that she was initially prescribed both quetiapine  and Vraylar  when first seen by this provider, diagnosed with bipolar I disorder by her previous provider. Both the patient and her husband report a history of paranoia, irritability, and visual hallucinations during episodes of depression in the past. It remains unclear whether she experienced manic symptoms. Despite attempts, records from her previous psychiatrist were not obtained.   Will continue venlafaxine , lamotrigine  to target depression.  Will continue Vraylar  and quetiapine  at this time given recent concerning episode. Will continue clonazepam  prn for anxiety. She agrees to come with her daughter at her next visit if possible.    # benzodiazepine dependence She has been on the current dose of clonazepam  since the initial visit with this writer without obvious concern of overusing this medication.  She declined to taper down this medication.  Although the hope was to taper off quetiapine  first, she had concerning episode of delusion.  It is likely not the best timing to taper down clonazepam  at this time,  although the hope is to taper off this medication in the future to avoid long term side effect.      3. High risk medication use She was advised to obtain UDS given she is on clonazepam .    Plan Continue venlafaxine  300 mg daily Continue Lamotrigine  300 mg daily Continue Vraylar  6 mg daily (Qtc 410 msec, hr 83, 10/2022) Continue quetiapine  100 mg  at night (episode of delusion when tapering down to 50 mg) Continue clonazepam  1 mg twice a day as needed for anxiety  Obtain urine screening - she would like Dr. Jolinda to order this Next appointment: 1/14 at 4 PM., IP  She will continue to see this clinical research associate, although transfer to [place] was attempted in the past.       Past trials of medication: sertraline, fluoxetine, lexapro, Celexa, duloxetine, Wellbutrin  lithium (psoriasis), Depakote (did not work), olanzapine, Abilify (weight gain), latuda, Geodon    The patient demonstrates the following risk factors for suicide: Chronic risk factors for suicide include: psychiatric disorder of depression. Acute risk factors for suicide include: family or marital conflict and unemployment. Protective factors for this patient include: positive social support and hope for the future. Considering these factors, the overall suicide risk at this point appears to be low. Patient is appropriate for outpatient follow up.       Collaboration of Care: Collaboration of Care: {BH OP Collaboration of Care:21014065}  Patient/Guardian was advised Release of Information must be obtained prior to any record release in order to collaborate their care with an outside provider. Patient/Guardian was advised if they have not already done so to contact the registration department to sign all necessary forms in order for us  to release information regarding their care.   Consent: Patient/Guardian gives verbal consent for treatment and assignment of benefits for services provided during this visit. Patient/Guardian expressed understanding and agreed to proceed.    Katheren Sleet, MD 05/18/2023, 3:43 PM

## 2023-05-20 ENCOUNTER — Other Ambulatory Visit: Payer: Self-pay | Admitting: Family Medicine

## 2023-05-20 DIAGNOSIS — J301 Allergic rhinitis due to pollen: Secondary | ICD-10-CM

## 2023-05-21 ENCOUNTER — Ambulatory Visit: Payer: 59 | Admitting: Psychiatry

## 2023-05-21 ENCOUNTER — Other Ambulatory Visit: Payer: Self-pay | Admitting: Allergy & Immunology

## 2023-05-21 DIAGNOSIS — J301 Allergic rhinitis due to pollen: Secondary | ICD-10-CM

## 2023-05-22 ENCOUNTER — Other Ambulatory Visit: Payer: Self-pay | Admitting: Allergy & Immunology

## 2023-05-22 DIAGNOSIS — J301 Allergic rhinitis due to pollen: Secondary | ICD-10-CM

## 2023-05-28 NOTE — Patient Instructions (Signed)
Schedule mammogram Schedule colonoscopy (this is overdue, call Dr Luvenia Starch office)  Our records indicate that you are due for your annual mammogram/breast imaging. While there is no way to prevent breast cancer, early detection provides the best opportunity for curing it. For women over the age of 37, the American Cancer Society recommends a yearly clinical breast exam and a yearly mammogram. These practices have saved thousands of lives. We need your help to ensure that you are receiving optimal medical care. Please call the imaging location that has done you previous mammograms. Please remember to list [place] as your primary care. This helps make sure we receive a report and can update your chart.  Below is the contact information for several local breast imaging centers. You may call the location that works best for you, and they will be happy to assistance in making you an appointment. You do not need an order for a regular screening mammogram. However, if you are having any problems or concerns with you breast area, please let your primary care provider know, and appropriate orders will be placed. Please let our office know if you have any questions or concerns. Or if you need information for another imaging center not on this list or outside of the area. We are commented to working with you on your health care journey.   The mobile unit/bus ([facility]) - they come twice a month to our location.  These appointments can be made through our office or by call The Breast Center  [facility]  [address] [place], [place] [address] Phone [phone]  [facility] Radiology Department  [address] [place], [place] [address] [phone]  [facility] (part of [facility])  [phone] [address]. [address], [place] [address] [phone]  [facility]  [address] [place] [place] [address] [phone]  [facility]  [address], [address] [place] [address] [phone]  [facility] in [place]  [address] [place], [place] [address] [phone]  [facility] [address] [place], [place] [address] [phone]  [facility] [address]  [address] [place], [place] [address] [phone]  [facility] [place], [place] [address] [phone]

## 2023-05-29 ENCOUNTER — Encounter: Payer: Self-pay | Admitting: Family Medicine

## 2023-05-29 ENCOUNTER — Ambulatory Visit: Payer: 59 | Admitting: Family Medicine

## 2023-05-29 VITALS — BP 135/81 | HR 88 | Temp 97.9°F | Ht 66.0 in | Wt 206.0 lb

## 2023-05-29 DIAGNOSIS — E669 Obesity, unspecified: Secondary | ICD-10-CM

## 2023-05-29 DIAGNOSIS — Z780 Asymptomatic menopausal state: Secondary | ICD-10-CM

## 2023-05-29 DIAGNOSIS — F411 Generalized anxiety disorder: Secondary | ICD-10-CM | POA: Insufficient documentation

## 2023-05-29 DIAGNOSIS — L409 Psoriasis, unspecified: Secondary | ICD-10-CM

## 2023-05-29 DIAGNOSIS — E039 Hypothyroidism, unspecified: Secondary | ICD-10-CM

## 2023-05-29 DIAGNOSIS — Z860100 Personal history of colon polyps, unspecified: Secondary | ICD-10-CM

## 2023-05-29 DIAGNOSIS — G4733 Obstructive sleep apnea (adult) (pediatric): Secondary | ICD-10-CM

## 2023-05-29 DIAGNOSIS — Z0001 Encounter for general adult medical examination with abnormal findings: Secondary | ICD-10-CM | POA: Diagnosis not present

## 2023-05-29 DIAGNOSIS — N1831 Chronic kidney disease, stage 3a: Secondary | ICD-10-CM

## 2023-05-29 DIAGNOSIS — G4734 Idiopathic sleep related nonobstructive alveolar hypoventilation: Secondary | ICD-10-CM | POA: Diagnosis not present

## 2023-05-29 DIAGNOSIS — G2581 Restless legs syndrome: Secondary | ICD-10-CM

## 2023-05-29 DIAGNOSIS — F321 Major depressive disorder, single episode, moderate: Secondary | ICD-10-CM | POA: Insufficient documentation

## 2023-05-29 DIAGNOSIS — R7309 Other abnormal glucose: Secondary | ICD-10-CM | POA: Diagnosis not present

## 2023-05-29 DIAGNOSIS — Z0000 Encounter for general adult medical examination without abnormal findings: Secondary | ICD-10-CM

## 2023-05-29 LAB — BAYER DCA HB A1C WAIVED: HB A1C (BAYER DCA - WAIVED): 5.4 % (ref 4.8–5.6)

## 2023-05-29 LAB — LIPID PANEL

## 2023-05-29 MED ORDER — BETAMETHASONE VALERATE 0.1 % EX OINT: TOPICAL_OINTMENT | Freq: Two times a day (BID) | CUTANEOUS | 1 refills | Status: DC | PRN

## 2023-05-29 MED ORDER — GABAPENTIN 300 MG PO CAPS: ORAL_CAPSULE | ORAL | 1 refills | Status: DC

## 2023-05-29 MED ORDER — ATORVASTATIN CALCIUM 80 MG PO TABS: 80.0000 mg | ORAL_TABLET | Freq: Every day | ORAL | 3 refills | Status: AC

## 2023-05-29 NOTE — Progress Notes (Signed)
Rebecca Moreno is a 58 y.o. female presents to office today for annual physical exam examination.    Concerns today include: 1.  Restless leg syndrome She wants to restart the gabapentin.  This really helped with restless leg before.  She is no longer in the care of Dr. Chriss Czar but see somebody at [facility] neurologic now.  2.  Psoriasis Has an appointment with a dermatologist in March.  Has been having psoriatic flare but the hands of gotten a lot better.  Her active flares on her ears  Occupation: not working, Substance use: none Health Maintenance Due  Topic Date Due   MAMMOGRAM  09/29/2021   Pneumococcal Vaccine 40-58 Years old (3 of 3 - PPSV23 or PCV20) 02/14/2023   Colonoscopy  05/13/2023   COVID-19 Vaccine (8 - 2024-25 season) 05/14/2023   Medicare Annual Wellness (AWV)  07/16/2023   Refills needed today: none  Immunization History  Administered Date(s) Administered   Influenza Inj Mdck Quad Pf 02/03/2018   Influenza, Seasonal, Injecte, Preservative Fre 03/26/2023   Influenza,inj,Quad PF,6+ Mos 05/27/2019, 02/29/2020, 01/17/2022   Influenza-Unspecified 03/03/2021   Moderna Covid-19 Fall Seasonal Vaccine 71yrs & older 05/14/2022, 03/19/2023   Moderna Covid-19 Vaccine Bivalent Booster 68yrs & up 02/21/2021   Moderna Sars-Covid-2 Vaccination 07/27/2019, 08/24/2019, 04/22/2020, 09/27/2020   Pneumococcal Conjugate-13 01/06/2016   Pneumococcal Polysaccharide-23 02/13/2018   Tdap 01/06/2016   Zoster Recombinant(Shingrix) 07/18/2021, 09/19/2021   Past Medical History:  Diagnosis Date   Acute pyelonephritis    Asthma    Bipolar disorder (HCC)    Cerebrovascular accident (CVA) due to occlusion of left carotid artery (HCC) 09/18/2019   Last Assessment & Plan:   Formatting of this note might be different from the original.     Colon polyp    Complication of anesthesia    "sometimes hard to put to sleep"   Depression    Bipolar   Gastric ulcer    around 2013.  stress  related   GERD (gastroesophageal reflux disease)    History of kidney stones    Hyperlipidemia    Hypothyroid    Polyp, uterus corpus    Sepsis due to Escherichia coli (E. coli) (HCC) 09/04/2018   Sleep apnea    no cpap used   Stroke [facility])    Social History   Socioeconomic History   Marital status: Married    Spouse name: Not on file   Number of children: 1   Years of education: Not on file   Highest education level: High school graduate  Occupational History   Occupation: disability  Tobacco Use   Smoking status: Every Day    Current packs/day: 0.50    Average packs/day: 0.5 packs/day for 30.0 years (15.0 ttl pk-yrs)    Types: Cigarettes   Smokeless tobacco: Never   Tobacco comments:    Is smoking about 0.5 of a pack and has a patch placed on   Vaping Use   Vaping status: Never Used  Substance and Sexual Activity   Alcohol use: Never   Drug use: Not Currently   Sexual activity: Yes    Partners: Male    Birth control/protection: Surgical    Comment: tubal  Other Topics Concern   Not on file  Social History Narrative   Not on file   Social Drivers of Health   Financial Resource Strain: Low Risk  (07/16/2022)   Overall Financial Resource Strain (CARDIA)    Difficulty of Paying Living Expenses: Not very hard  Food Insecurity: Food Insecurity Present (07/16/2022)   Hunger Vital Sign    Worried About Running Out of Food in the Last Year: Sometimes true    Ran Out of Food in the Last Year: Sometimes true  Transportation Needs: No Transportation Needs (07/16/2022)   PRAPARE - Administrator, Civil Service (Medical): No    Lack of Transportation (Non-Medical): No  Physical Activity: Inactive (07/16/2022)   Exercise Vital Sign    Days of Exercise per Week: 0 days    Minutes of Exercise per Session: 0 min  Stress: No Stress Concern Present (07/16/2022)   [employer] of Occupational Health - Occupational Stress Questionnaire    Feeling of Stress : Not at  all  Social Connections: Moderately Isolated (07/16/2022)   Social Connection and Isolation Panel [NHANES]    Frequency of Communication with Friends and Family: More than three times a week    Frequency of Social Gatherings with Friends and Family: More than three times a week    Attends Religious Services: Never    Database administrator or Organizations: No    Attends Banker Meetings: Never    Marital Status: Married  Catering manager Violence: Not At Risk (07/16/2022)   Humiliation, Afraid, Rape, and Kick questionnaire    Fear of Current or Ex-Partner: No    Emotionally Abused: No    Physically Abused: No    Sexually Abused: No   Past Surgical History:  Procedure Laterality Date   ABLATION ON ENDOMETRIOSIS     COLONOSCOPY     Per patient, done around 2013 in [place], had polyp and overdue for follow up.   COLONOSCOPY WITH PROPOFOL N/A 05/12/2018   Procedure: COLONOSCOPY WITH PROPOFOL;  Surgeon: Corbin Ade, MD;  Location: [facility];  Service: Endoscopy;  Laterality: N/A;  12:00pm   CYSTOSCOPY W/ URETERAL STENT PLACEMENT Left 09/01/2018   Procedure: CYSTOSCOPY WITH RETROGRADE PYELOGRAM/URETERAL STENT PLACEMENT;  Surgeon: Rene Paci, MD;  Location: [facility];  Service: Urology;  Laterality: Left;   CYSTOSCOPY/URETEROSCOPY/HOLMIUM LASER/STENT PLACEMENT Left 09/17/2018   Procedure: CYSTOSCOPY/URETEROSCOPY/HOLMIUM LASER/STENT PLACEMENT;  Surgeon: Rene Paci, MD;  Location: [facility];  Service: Urology;  Laterality: Left;  ONLY NEEDS 45 MIN   OVARIAN CYST SURGERY Left    POLYPECTOMY  05/12/2018   Procedure: POLYPECTOMY;  Surgeon: Corbin Ade, MD;  Location: [facility];  Service: Endoscopy;;  polyp at splenic flexure   TEMPOROMANDIBULAR JOINT SURGERY     9 surgeries   TUBAL LIGATION     vaginal childbirth     1   Family History  Problem Relation Age of Onset   Anxiety disorder Mother    Hypertension Mother    Heart failure Mother     Stroke Mother    Hyperlipidemia Father    Diabetes Father    Stroke Father    Hypertension Father    Colon polyps Father        older than 8   Diabetes Sister    Hypertension Sister    Ovarian cancer Sister    Ovarian cancer Maternal Grandmother    Colon cancer Maternal Grandmother    Renal Disease Paternal Grandmother    Heart attack Paternal Grandfather    Asthma Daughter    Bipolar disorder Other    Asthma Niece     Current Outpatient Medications:    albuterol (VENTOLIN HFA) 108 (90 Base) MCG/ACT inhaler, Inhale 1 puff into the lungs every 6 (six)  hours as needed for wheezing or shortness of breath., Disp: 6.7 g, Rfl: 1   atorvastatin (LIPITOR) 80 MG tablet, TAKE ONE TABLET AT BEDTIME, Disp: 100 tablet, Rfl: 0   betamethasone valerate ointment (VALISONE) 0.1 %, APPLY TOPICALLY 2 TIMES A DAY TO PSORIASIS AS NEEDED FOR FLARE UPS, Disp: 45 g, Rfl: 1   CALCIUM CITRATE-VITAMIN D3 PO, Take 1 tablet by mouth daily., Disp: , Rfl:    Cariprazine HCl (VRAYLAR) 6 MG CAPS, Take 1 capsule (6 mg total) by mouth daily., Disp: 90 capsule, Rfl: 1   Cholecalciferol (VITAMIN D3) 5000 units TABS, Take 5,000 Units by mouth daily. , Disp: , Rfl:    clonazePAM (KLONOPIN) 1 MG tablet, Take 1 tablet (1 mg total) by mouth 2 (two) times daily., Disp: 60 tablet, Rfl: 1   Dexlansoprazole (DEXILANT) 30 MG capsule DR, TAKE (1) CAPSULE DAILY, Disp: 90 capsule, Rfl: 3   famotidine (PEPCID) 20 MG tablet, TAKE ONE TABLET BY MOUTH EVERY DAY, Disp: 90 tablet, Rfl: 0   FLOVENT HFA 220 MCG/ACT inhaler, INHALE 2 PUFFS TWICE DAILY, Disp: 12 g, Rfl: 0   fluticasone (FLONASE) 50 MCG/ACT nasal spray, SPRAY ONE SPRAY IN EACH NOSTRIL DAILY AT night TO help with morning congestion, Disp: 16 g, Rfl: 5   gabapentin (NEURONTIN) 300 MG capsule, Take 1 capsule at supper and 1 capsule at bedtime.  Further fills per Chriss Czar, Neurology, Disp: 180 capsule, Rfl: 0   lamoTRIgine (LAMICTAL) 100 MG tablet, Take 3 tablets (300 mg  total) by mouth at bedtime., Disp: 270 tablet, Rfl: 1   levothyroxine (SYNTHROID) 50 MCG tablet, TAKE ONE TABLET DAILY, Disp: 90 tablet, Rfl: 2   loratadine (CLARITIN) 10 MG tablet, TAKE 1 TABLET DAILY, Disp: 30 tablet, Rfl: 5   magnesium gluconate (MAGONATE) 500 MG tablet, Take 500 mg by mouth daily., Disp: , Rfl:    Multiple Vitamins-Minerals (MULTIVITAMIN WOMEN PO), Take 1 tablet by mouth daily., Disp: , Rfl:    QUEtiapine Fumarate 150 MG TABS, Take 150 mg by mouth at bedtime., Disp: 90 tablet, Rfl: 0   tiZANidine (ZANAFLEX) 4 MG tablet, Take 0.5-1 tablets (2-4 mg total) by mouth every 8 (eight) hours as needed for muscle spasms., Disp: 60 tablet, Rfl: 0   triamcinolone cream (KENALOG) 0.1 %, Apply 1 Application topically 2 (two) times daily., Disp: 80 g, Rfl: 0   venlafaxine XR (EFFEXOR-XR) 150 MG 24 hr capsule, Take 2 capsules (300 mg total) by mouth daily., Disp: 180 capsule, Rfl: 1  Allergies  Allergen Reactions   Lithium Other (See Comments)    Psoriasis      ROS: Review of Systems Pertinent items noted in HPI and remainder of comprehensive ROS otherwise negative.   Voice is getting better. Undergoing SLP and sees ENT in Feb.  Physical exam BP 135/81   Pulse 88   Temp 97.9 F (36.6 C)   Ht 5\' 6"  (1.676 m)   Wt 206 lb (93.4 kg)   LMP 12/13/2017 (Approximate)   SpO2 91%   BMI 33.25 kg/m  General appearance: alert, cooperative, appears stated age, and no distress Head: Normocephalic, without obvious abnormality, atraumatic Eyes: negative findings: lids and lashes normal, conjunctivae and sclerae normal, corneas clear, and pupils equal, round, reactive to light and accomodation Ears:  Diffuse psoriatic dermatitis noted along the external auditory canal and external ear.  Right side with hemostatic bleed Nose: Nares normal. Septum midline. Mucosa normal. No drainage or sinus tenderness. Throat: lips, mucosa, and tongue normal; teeth  and gums normal Neck: no adenopathy,  supple, symmetrical, trachea midline, and thyroid not enlarged, symmetric, no tenderness/mass/nodules Back: symmetric, no curvature. ROM normal. No CVA tenderness. Lungs: clear to auscultation bilaterally Heart: regular rate and rhythm, S1, S2 normal, no murmur, click, rub or gallop Abdomen: soft, non-tender; bowel sounds normal; no masses,  no organomegaly Extremities: extremities normal, atraumatic, no cyanosis or edema Pulses: 2+ and symmetric Skin:  Psoriasis flare as above Lymph nodes: Cervical, supraclavicular, and axillary nodes normal. Neurologic: Grossly normal      03/21/2023    5:21 PM 10/29/2022    2:33 PM 07/16/2022    4:16 PM  Depression screen PHQ 2/9  Decreased Interest  1 0  Down, Depressed, Hopeless  1 0  PHQ - 2 Score  2 0  Altered sleeping  1   Tired, decreased energy  2   Change in appetite  1   Feeling bad or failure about yourself   1   Trouble concentrating  2   Moving slowly or fidgety/restless  1   Suicidal thoughts  0   PHQ-9 Score  10   Difficult doing work/chores  Somewhat difficult      Information is confidential and restricted. Go to Review Flowsheets to unlock data.      03/21/2023    5:22 PM 10/29/2022    2:33 PM 06/05/2022    1:15 PM 03/20/2022    2:30 PM  GAD 7 : Generalized Anxiety Score  Nervous, Anxious, on Edge  1 2 2   Control/stop worrying  1 1 2   Worry too much - different things  1 1 2   Trouble relaxing  0 2 2  Restless  0 2 2  Easily annoyed or irritable  1 1 1   Afraid - awful might happen  0 1 2  Total GAD 7 Score  4 10 13   Anxiety Difficulty  Somewhat difficult Somewhat difficult Somewhat difficult     Information is confidential and restricted. Go to Review Flowsheets to unlock data.     Assessment/ Plan: Rebecca Moreno here for annual physical exam.   Annual physical exam  Stage 3a chronic kidney disease (HCC) - Plan: CMP14+EGFR, CBC, VITAMIN D 25 Hydroxy (Vit-D Deficiency, Fractures)  Acquired hypothyroidism -  Plan: TSH + free T4  OSA treated with BiPAP - Plan: CBC  Nocturnal hypoxia - Plan: CBC  History of colonic polyps  Asymptomatic postmenopausal estrogen deficiency - Plan: DG [facility] DEXA  Obesity (BMI 30-39.9) - Plan: CMP14+EGFR, Lipid Panel, Bayer DCA Hb A1c Waived  Psoriasis - Plan: betamethasone valerate ointment (VALISONE) 0.1 %  Restless leg syndrome - Plan: gabapentin (NEURONTIN) 300 MG capsule  She will schedule mammogram and colonoscopy and I gave her information him to call.  Check renal function, CBC, vitamin D given CKD 3A  Asymptomatic from a thyroid standpoint.  Check thyroid levels  Continue BiPAP as prescribed.  CBC ordered given history of nocturnal hypoxia  Bone density ordered.  She will get this scheduled  Nonfasting labs collected today.  A1c did not demonstrate any evidence of diabetes or prediabetes  I renewed her betamethasone until she can be seen by dermatology  I renewed her gabapentin as well.  Keep follow-up with neurology  Counseled on healthy lifestyle choices, including diet (rich in fruits, vegetables and lean meats and low in salt and simple carbohydrates) and exercise (at least 30 minutes of moderate physical activity daily).  Patient to follow up 78m  Arland Usery M. Nadine Counts, DO

## 2023-05-30 LAB — CMP14+EGFR
ALT: 40 IU/L — ABNORMAL HIGH (ref 0–32)
AST: 31 IU/L (ref 0–40)
Albumin: 4.1 g/dL (ref 3.8–4.9)
Alkaline Phosphatase: 120 IU/L (ref 44–121)
BUN/Creatinine Ratio: 13 (ref 9–23)
BUN: 12 mg/dL (ref 6–24)
CO2: 28 mmol/L (ref 20–29)
Calcium: 9.2 mg/dL (ref 8.7–10.2)
Chloride: 100 mmol/L (ref 96–106)
Creatinine, Ser: 0.93 mg/dL (ref 0.57–1.00)
Globulin, Total: 2 g/dL (ref 1.5–4.5)
Glucose: 126 mg/dL — ABNORMAL HIGH (ref 70–99)
Potassium: 4.6 mmol/L (ref 3.5–5.2)
Sodium: 141 mmol/L (ref 134–144)
Total Protein: 6.1 g/dL (ref 6.0–8.5)
eGFR: 71 mL/min/{1.73_m2} (ref >59)

## 2023-05-30 LAB — LIPID PANEL
Cholesterol, Total: 144 mg/dL (ref 100–199)
HDL: 47 mg/dL (ref >39)
LDL CALC COMMENT:: 3.1 ratio (ref 0.0–4.4)
LDL Chol Calc (NIH): 70 mg/dL (ref 0–99)
Triglycerides: 157 mg/dL — ABNORMAL HIGH (ref 0–149)
VLDL Cholesterol Cal: 27 mg/dL (ref 5–40)

## 2023-05-30 LAB — CBC
Hematocrit: 40.3 % (ref 34.0–46.6)
Hemoglobin: 13.9 g/dL (ref 11.1–15.9)
MCH: 33.6 pg — ABNORMAL HIGH (ref 26.6–33.0)
MCHC: 34.5 g/dL (ref 31.5–35.7)
MCV: 97 fL (ref 79–97)
Platelets: 231 10*3/uL (ref 150–450)
RBC: 4.14 x10E6/uL (ref 3.77–5.28)
RDW: 11.5 % — ABNORMAL LOW (ref 11.7–15.4)
WBC: 7.1 10*3/uL (ref 3.4–10.8)

## 2023-05-30 LAB — TSH+FREE T4
Free T4: 0.95 ng/dL (ref 0.82–1.77)
TSH: 2.76 u[IU]/mL (ref 0.450–4.500)

## 2023-05-30 LAB — VITAMIN D 25 HYDROXY (VIT D DEFICIENCY, FRACTURES): Vit D, 25-Hydroxy: 75.3 ng/mL (ref 30.0–100.0)

## 2023-05-31 ENCOUNTER — Other Ambulatory Visit: Payer: Self-pay | Admitting: Family Medicine

## 2023-05-31 ENCOUNTER — Encounter: Payer: Self-pay | Admitting: Family Medicine

## 2023-06-03 ENCOUNTER — Ambulatory Visit: Payer: Self-pay | Admitting: Family Medicine

## 2023-06-03 DIAGNOSIS — T7840XA Allergy, unspecified, initial encounter: Secondary | ICD-10-CM | POA: Diagnosis not present

## 2023-06-03 NOTE — Telephone Encounter (Signed)
  Chief Complaint: Facial swelling Symptoms: Redness, tenderness, facial swelling Frequency: Ongoing since yesterday Pertinent Negatives: Patient denies N/V, SOB Disposition: [] ED /[x] Urgent Care (no appt availability in office) / [] Appointment(In office/virtual)/ []  [facility]/ [] Home Care/ [] Refused Recommended Disposition /[] [place] [facility]/ []  Follow-up with PCP Additional Notes: Pt reports right side facial swelling beginning yesterday, she reports it is 9/10 painful to touch, warm to touch, redness with streaking. Pt reports she feels warm like she has a fever but has not taken a reading. Pt notes she has a history of cellulitis in her face. OV not available at her PCP office, pt declines OV at alternate office/virtual. Pt advises she will proceed to UC for eval/treat. This RN educated pt on home care, new-worsening symptoms, when to call back/seek emergent care. Pt verbalized understanding and agrees to plan.    Copied from CRM [id number]. Topic: Clinical - Red Word Triage >> Jun 03, 2023 10:39 AM Gaetano Hawthorne wrote: [facility] that prompted transfer to Nurse Triage: face hurts and neck swollen on the right side - head hurts when you touch it, patient reports dizziness yesterday Reason for Disposition  [1] Swelling is red AND [2] very painful to touch  Answer Assessment - Initial Assessment Questions 1. ONSET: "When did the swelling start?" (e.g., minutes, hours, days)     Yesterday 2. LOCATION: "What part of the face is swollen?"     Right side of the face 3. SEVERITY: "How swollen is it?"     Noticeable, eye swollen 4. ITCHING: "Is there any itching?" If Yes, ask: "How much?"   (Scale 1-10; mild, moderate or severe)     None 5. PAIN: "Is the swelling painful to touch?" If Yes, ask: "How painful is it?"   (Scale 1-10; mild, moderate or severe)   - NONE (0): no pain   - MILD (1-3): doesn't interfere with normal activities    - MODERATE (4-7): interferes with normal  activities or awakens from sleep    - SEVERE (8-10): excruciating pain, unable to do any normal activities      9/10, tender to touch 6. FEVER: "Do you have a fever?" If Yes, ask: "What is it, how was it measured, and when did it start?"      "Feels warm, sweating" did not take reading 7. CAUSE: "What do you think is causing the face swelling?"     Unknown 8. RECURRENT SYMPTOM: "Have you had face swelling before?" If Yes, ask: "When was the last time?" "What happened that time?"     Pt has history of cellulitis in face 9. OTHER SYMPTOMS: "Do you have any other symptoms?" (e.g., toothache, leg swelling)     Red streaking, warm to touch  Protocols used: Face Swelling-A-AH

## 2023-06-11 DIAGNOSIS — Z720 Tobacco use: Secondary | ICD-10-CM | POA: Diagnosis not present

## 2023-06-11 DIAGNOSIS — J384 Edema of larynx: Secondary | ICD-10-CM | POA: Diagnosis not present

## 2023-06-11 DIAGNOSIS — F17290 Nicotine dependence, other tobacco product, uncomplicated: Secondary | ICD-10-CM | POA: Diagnosis not present

## 2023-06-11 DIAGNOSIS — K219 Gastro-esophageal reflux disease without esophagitis: Secondary | ICD-10-CM | POA: Diagnosis not present

## 2023-06-11 DIAGNOSIS — J381 Polyp of vocal cord and larynx: Secondary | ICD-10-CM | POA: Diagnosis not present

## 2023-06-11 DIAGNOSIS — R6889 Other general symptoms and signs: Secondary | ICD-10-CM | POA: Diagnosis not present

## 2023-06-11 DIAGNOSIS — F17210 Nicotine dependence, cigarettes, uncomplicated: Secondary | ICD-10-CM | POA: Diagnosis not present

## 2023-06-11 DIAGNOSIS — R0989 Other specified symptoms and signs involving the circulatory and respiratory systems: Secondary | ICD-10-CM | POA: Diagnosis not present

## 2023-06-11 DIAGNOSIS — J370 Chronic laryngitis: Secondary | ICD-10-CM | POA: Diagnosis not present

## 2023-06-11 DIAGNOSIS — R490 Dysphonia: Secondary | ICD-10-CM | POA: Diagnosis not present

## 2023-06-13 DIAGNOSIS — G4733 Obstructive sleep apnea (adult) (pediatric): Secondary | ICD-10-CM | POA: Diagnosis not present

## 2023-06-13 DIAGNOSIS — J449 Chronic obstructive pulmonary disease, unspecified: Secondary | ICD-10-CM | POA: Diagnosis not present

## 2023-06-13 DIAGNOSIS — R0902 Hypoxemia: Secondary | ICD-10-CM | POA: Diagnosis not present

## 2023-06-21 ENCOUNTER — Other Ambulatory Visit: Payer: Self-pay | Admitting: Psychiatry

## 2023-06-21 ENCOUNTER — Other Ambulatory Visit: Payer: Self-pay | Admitting: Family Medicine

## 2023-06-21 DIAGNOSIS — J301 Allergic rhinitis due to pollen: Secondary | ICD-10-CM

## 2023-06-22 ENCOUNTER — Other Ambulatory Visit: Payer: Self-pay | Admitting: Psychiatry
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
## 2023-07-02 ENCOUNTER — Ambulatory Visit (INDEPENDENT_AMBULATORY_CARE_PROVIDER_SITE_OTHER): Payer: No Typology Code available for payment source | Admitting: Psychiatry

## 2023-07-02 ENCOUNTER — Encounter: Payer: Self-pay | Admitting: Psychiatry

## 2023-07-02 VITALS — BP 120/82 | HR 98 | Temp 98.1°F | Ht 66.0 in | Wt 209.6 lb

## 2023-07-02 DIAGNOSIS — F419 Anxiety disorder, unspecified: Secondary | ICD-10-CM | POA: Diagnosis not present

## 2023-07-02 DIAGNOSIS — F259 Schizoaffective disorder, unspecified: Secondary | ICD-10-CM

## 2023-07-02 MED ORDER — QUETIAPINE FUMARATE 200 MG PO TABS: 200.0000 mg | ORAL_TABLET | Freq: Every day | ORAL | 0 refills | Status: DC

## 2023-07-02 MED ORDER — CLONAZEPAM 1 MG PO TABS: 1.0000 mg | ORAL_TABLET | Freq: Two times a day (BID) | ORAL | 1 refills | Status: DC

## 2023-07-02 NOTE — Patient Instructions (Signed)
 Continue venlafaxine 300 mg daily Continue Lamotrigine 300 mg daily Continue Vraylar 6 mg daily Increase quetiapine 200 mg at night  Continue clonazepam 1 mg twice a day as needed for anxiety  Next appointment: 4/22 at 1 PM

## 2023-07-11 DIAGNOSIS — G4733 Obstructive sleep apnea (adult) (pediatric): Secondary | ICD-10-CM | POA: Diagnosis not present

## 2023-07-11 DIAGNOSIS — J449 Chronic obstructive pulmonary disease, unspecified: Secondary | ICD-10-CM | POA: Diagnosis not present

## 2023-07-11 DIAGNOSIS — R0902 Hypoxemia: Secondary | ICD-10-CM | POA: Diagnosis not present

## 2023-07-17 ENCOUNTER — Ambulatory Visit: Payer: 59

## 2023-07-17 VITALS — Ht 66.0 in | Wt 209.0 lb

## 2023-07-17 DIAGNOSIS — Z0000 Encounter for general adult medical examination without abnormal findings: Secondary | ICD-10-CM | POA: Diagnosis not present

## 2023-07-17 NOTE — Patient Instructions (Signed)
 Rebecca Moreno , Thank you for taking time to come for your Medicare Wellness Visit. I appreciate your ongoing commitment to your health goals. Please review the following plan we discussed and let me know if I can assist you in the future.   Referrals/Orders/Follow-Ups/Clinician Recommendations: Aim for 30 minutes of exercise or brisk walking, 6-8 glasses of water, and 5 servings of fruits and vegetables each day.   This is a list of the screening recommended for you and due dates:  Health Maintenance  Topic Date Due   Colon Cancer Screening  05/13/2023   Mammogram  05/28/2024*   COVID-19 Vaccine (8 - Moderna risk 2024-25 season) 09/16/2023   Medicare Annual Wellness Visit  07/16/2024   Pap with HPV screening  09/29/2024   DTaP/Tdap/Td vaccine (3 - Td or Tdap) 01/05/2026   Pneumococcal Vaccination (4 of 4 - PPSV23 or PCV20) 12/01/2029   Flu Shot  Completed   Hepatitis C Screening  Completed   HIV Screening  Completed   Zoster (Shingles) Vaccine  Completed   HPV Vaccine  Aged Out  *Topic was postponed. The date shown is not the original due date.    Advanced directives: (ACP Link)Information on Advanced Care Planning can be found at [facility] [place] Advance Health Care Directives Advance Health Care Directives. [email or web address]   Next Medicare Annual Wellness Visit scheduled for next year: Yes

## 2023-07-17 NOTE — Progress Notes (Signed)
 Subjective:   Rebecca Moreno is a 58 y.o. who presents for a Medicare Wellness preventive visit.  Visit Complete: Virtual I connected with  Rebecca Moreno on 07/17/23 by a audio enabled telemedicine application and verified that I am speaking with the correct person using two identifiers.  Patient Location: Home  Provider Location: Home Office  I discussed the limitations of evaluation and management by telemedicine. The patient expressed understanding and agreed to proceed.  Vital Signs: Because this visit was a virtual/telehealth visit, some criteria may be missing or patient reported. Any vitals not documented were not able to be obtained and vitals that have been documented are patient reported.  VideoDeclined- This patient declined Librarian, academic. Therefore the visit was completed with audio only.  AWV Questionnaire: No: Patient Medicare AWV questionnaire was not completed prior to this visit.  Cardiac Risk Factors include: smoking/ tobacco exposure;sedentary lifestyle     Objective:    Today's Vitals   07/17/23 1529  Weight: 209 lb (94.8 kg)  Height: 5\' 6"  (1.676 m)   Body mass index is 33.73 kg/m.     07/17/2023    3:38 PM 12/03/2022    2:24 PM 07/16/2022    5:00 PM 06/01/2022   12:20 PM 01/30/2022    1:27 PM 01/30/2022    1:10 PM 11/24/2021    1:26 PM  Advanced Directives  Does Patient Have a Medical Advance Directive? No No No No No No Yes  Type of Advance Directive       Living will;Healthcare Power of Attorney  Does patient want to make changes to medical advance directive?       No - Patient declined  Would patient like information on creating a medical advance directive? Yes (MAU/Ambulatory/Procedural Areas - Information given) No - Patient declined No - Patient declined No - Patient declined No - Patient declined No - Patient declined No - Patient declined    Current Medications (verified) Outpatient Encounter Medications as  of 07/17/2023  Medication Sig   albuterol (VENTOLIN HFA) 108 (90 Base) MCG/ACT inhaler Inhale 1 puff into the lungs every 6 (six) hours as needed for wheezing or shortness of breath.   atorvastatin (LIPITOR) 80 MG tablet Take 1 tablet (80 mg total) by mouth at bedtime.   betamethasone valerate ointment (VALISONE) 0.1 % Apply topically 2 (two) times daily as needed.   CALCIUM CITRATE-VITAMIN D3 PO Take 1 tablet by mouth daily.   Cariprazine HCl (VRAYLAR) 6 MG CAPS Take 1 capsule (6 mg total) by mouth daily.   Cholecalciferol (VITAMIN D3) 5000 units TABS Take 5,000 Units by mouth daily.    [START ON 07/22/2023] clonazePAM (KLONOPIN) 1 MG tablet Take 1 tablet (1 mg total) by mouth 2 (two) times daily.   Dexlansoprazole 30 MG capsule DR TAKE ONE CAPSULE DAILY   famotidine (PEPCID) 20 MG tablet TAKE ONE TABLET BY MOUTH EVERY DAY   FLOVENT HFA 220 MCG/ACT inhaler INHALE 2 PUFFS TWICE DAILY   fluticasone (FLONASE) 50 MCG/ACT nasal spray SPRAY ONE SPRAY IN EACH NOSTRIL DAILY AT night TO help with morning congestion   gabapentin (NEURONTIN) 300 MG capsule Take 1 capsule at supper and 1 capsule at bedtime.   lamoTRIgine (LAMICTAL) 100 MG tablet Take 3 tablets (300 mg total) by mouth at bedtime.   levothyroxine (SYNTHROID) 50 MCG tablet TAKE ONE TABLET DAILY   loratadine (CLARITIN) 10 MG tablet TAKE ONE TABLET DAILY   magnesium gluconate (MAGONATE) 500 MG tablet Take  500 mg by mouth daily.   Multiple Vitamins-Minerals (MULTIVITAMIN WOMEN PO) Take 1 tablet by mouth daily.   QUEtiapine (SEROQUEL) 200 MG tablet Take 1 tablet (200 mg total) by mouth at bedtime.   tiZANidine (ZANAFLEX) 4 MG tablet Take 0.5-1 tablets (2-4 mg total) by mouth every 8 (eight) hours as needed for muscle spasms.   triamcinolone cream (KENALOG) 0.1 % Apply 1 Application topically 2 (two) times daily.   venlafaxine XR (EFFEXOR-XR) 150 MG 24 hr capsule Take 2 capsules (300 mg total) by mouth daily.   No facility-administered encounter  medications on file as of 07/17/2023.    Allergies (verified) Lithium   History: Past Medical History:  Diagnosis Date   Acute pyelonephritis    Asthma    Bipolar disorder (HCC)    Cerebrovascular accident (CVA) due to occlusion of left carotid artery (HCC) 09/18/2019   Last Assessment & Plan:   Formatting of this note might be different from the original.     Colon polyp    Complication of anesthesia    "sometimes hard to put to sleep"   Depression    Bipolar   Gastric ulcer    around 2013.  stress related   GERD (gastroesophageal reflux disease)    History of kidney stones    Hyperlipidemia    Hypothyroid    Polyp, uterus corpus    Sepsis due to Escherichia coli (E. coli) (HCC) 09/04/2018   Sleep apnea    no cpap used   Stroke [facility])    Past Surgical History:  Procedure Laterality Date   ABLATION ON ENDOMETRIOSIS     COLONOSCOPY     Per patient, done around 2013 in [place], had polyp and overdue for follow up.   COLONOSCOPY WITH PROPOFOL N/A 05/12/2018   Procedure: COLONOSCOPY WITH PROPOFOL;  Surgeon: Corbin Ade, MD;  Location: [facility];  Service: Endoscopy;  Laterality: N/A;  12:00pm   CYSTOSCOPY W/ URETERAL STENT PLACEMENT Left 09/01/2018   Procedure: CYSTOSCOPY WITH RETROGRADE PYELOGRAM/URETERAL STENT PLACEMENT;  Surgeon: Rene Paci, MD;  Location: [facility];  Service: Urology;  Laterality: Left;   CYSTOSCOPY/URETEROSCOPY/HOLMIUM LASER/STENT PLACEMENT Left 09/17/2018   Procedure: CYSTOSCOPY/URETEROSCOPY/HOLMIUM LASER/STENT PLACEMENT;  Surgeon: Rene Paci, MD;  Location: [facility];  Service: Urology;  Laterality: Left;  ONLY NEEDS 45 MIN   OVARIAN CYST SURGERY Left    POLYPECTOMY  05/12/2018   Procedure: POLYPECTOMY;  Surgeon: Corbin Ade, MD;  Location: [facility];  Service: Endoscopy;;  polyp at splenic flexure   TEMPOROMANDIBULAR JOINT SURGERY     9 surgeries   TUBAL LIGATION     vaginal childbirth     1   Family History   Problem Relation Age of Onset   Anxiety disorder Mother    Hypertension Mother    Heart failure Mother    Stroke Mother    Hyperlipidemia Father    Diabetes Father    Stroke Father    Hypertension Father    Colon polyps Father        older than 27   Diabetes Sister    Hypertension Sister    Ovarian cancer Sister    Ovarian cancer Maternal Grandmother    Colon cancer Maternal Grandmother    Renal Disease Paternal Grandmother    Heart attack Paternal Grandfather    Asthma Daughter    Bipolar disorder Other    Asthma Niece    Social History   Socioeconomic History   Marital status: Married  Spouse name: Not on file   Number of children: 1   Years of education: Not on file   Highest education level: High school graduate  Occupational History   Occupation: disability  Tobacco Use   Smoking status: Every Day    Current packs/day: 0.50    Average packs/day: 0.5 packs/day for 30.0 years (15.0 ttl pk-yrs)    Types: Cigarettes   Smokeless tobacco: Never   Tobacco comments:    Is smoking about 0.5 of a pack and has a patch placed on   Vaping Use   Vaping status: Never Used  Substance and Sexual Activity   Alcohol use: Never   Drug use: Not Currently   Sexual activity: Yes    Partners: Male    Birth control/protection: Surgical    Comment: tubal  Other Topics Concern   Not on file  Social History Narrative   Not on file   Social Drivers of Health   Financial Resource Strain: Low Risk  (07/17/2023)   Overall Financial Resource Strain (CARDIA)    Difficulty of Paying Living Expenses: Not hard at all  Food Insecurity: No Food Insecurity (07/17/2023)   Hunger Vital Sign    Worried About Running Out of Food in the Last Year: Never true    Ran Out of Food in the Last Year: Never true  Recent Concern: Food Insecurity - Food Insecurity Present (05/29/2023)   Hunger Vital Sign    Worried About Running Out of Food in the Last Year: Sometimes true    Ran Out of Food in the  Last Year: Sometimes true  Transportation Needs: No Transportation Needs (07/17/2023)   PRAPARE - Administrator, Civil Service (Medical): No    Lack of Transportation (Non-Medical): No  Physical Activity: Inactive (07/17/2023)   Exercise Vital Sign    Days of Exercise per Week: 0 days    Minutes of Exercise per Session: 0 min  Stress: No Stress Concern Present (07/17/2023)   [employer] of Occupational Health - Occupational Stress Questionnaire    Feeling of Stress : Not at all  Social Connections: Socially Integrated (07/17/2023)   Social Connection and Isolation Panel [NHANES]    Frequency of Communication with Friends and Family: More than three times a week    Frequency of Social Gatherings with Friends and Family: Three times a week    Attends Religious Services: More than 4 times per year    Active Member of Clubs or Organizations: Yes    Attends Banker Meetings: More than 4 times per year    Marital Status: Married  Recent Concern: Social Connections - Moderately Isolated (05/29/2023)   Social Connection and Isolation Panel [NHANES]    Frequency of Communication with Friends and Family: More than three times a week    Frequency of Social Gatherings with Friends and Family: More than three times a week    Attends Religious Services: Never    Database administrator or Organizations: No    Attends Engineer, structural: Never    Marital Status: Married    Tobacco Counseling Ready to quit: Not Answered Counseling given: Not Answered Tobacco comments: Is smoking about 0.5 of a pack and has a patch placed on     Clinical Intake:  Pre-visit preparation completed: Yes  Pain : No/denies pain     Diabetes: No  How often do you need to have someone help you when you read instructions, pamphlets, or other written  materials from your doctor or pharmacy?: 1 - Never  Interpreter Needed?: No  Information entered by :: Kandis Fantasia  LPN   Activities of Daily Living     07/17/2023    3:37 PM  In your present state of health, do you have any difficulty performing the following activities:  Hearing? 0  Vision? 0  Difficulty concentrating or making decisions? 0  Walking or climbing stairs? 0  Dressing or bathing? 0  Doing errands, shopping? 0  Preparing Food and eating ? N  Using the Toilet? N  In the past six months, have you accidently leaked urine? N  Do you have problems with loss of bowel control? N  Managing your Medications? N  Managing your Finances? N  Housekeeping or managing your Housekeeping? N    Patient Care Team: Raliegh Ip, DO as PCP - General (Family Medicine) Neysa Hotter, MD as Consulting Physician (Psychiatry) Jena Gauss Gerrit Friends, MD as Consulting Physician (Gastroenterology) Rene Paci, MD as Consulting Physician (Urology) Doreatha Massed, MD as Consulting Physician (Hematology)  Indicate any recent Medical Services you may have received from other than [facility] providers in the past year (date may be approximate).     Assessment:   This is a routine wellness examination for Latressa.  Hearing/Vision screen Hearing Screening - Comments:: Denies hearing difficulties   Vision Screening - Comments:: Wears rx glasses - up to date with routine eye exams with [facility] [place]    Goals Addressed   None    Depression Screen     07/17/2023    3:32 PM 05/29/2023    2:05 PM 03/21/2023    5:21 PM 10/29/2022    2:33 PM 07/16/2022    4:16 PM 06/05/2022    1:15 PM 03/20/2022    2:30 PM  PHQ 2/9 Scores  PHQ - 2 Score 0 0  2 0 1 1  PHQ- 9 Score 1 1  10  3 6      Information is confidential and restricted. Go to Review Flowsheets to unlock data.    Fall Risk     07/17/2023    3:36 PM 05/29/2023    2:03 PM 07/16/2022    4:15 PM 06/05/2022    1:24 PM 06/05/2022    1:15 PM  Fall Risk   Falls in the past year? 0 0 1 1 0  Number falls in past yr: 0 0 1 1 0  Injury with  Fall? 0 0 1 1 0  Risk for fall due to : No Fall Risks No Fall Risks History of fall(s) Impaired mobility;Impaired balance/gait No Fall Risks  Follow up Falls prevention discussed;Education provided;Falls evaluation completed Education provided Falls evaluation completed;Education provided;Falls prevention discussed Falls evaluation completed Education provided    MEDICARE RISK AT HOME:  Medicare Risk at Home Any stairs in or around the home?: No If so, are there any without handrails?: No Home free of loose throw rugs in walkways, pet beds, electrical cords, etc?: Yes Adequate lighting in your home to reduce risk of falls?: Yes Life alert?: No Use of a cane, walker or w/c?: No Grab bars in the bathroom?: Yes Shower chair or bench in shower?: No Elevated toilet seat or a handicapped toilet?: Yes  TIMED UP AND GO:  Was the test performed?  No  Cognitive Function: 6CIT completed    08/28/2021    2:03 PM 07/22/2018    4:16 PM  MMSE - Mini Mental State Exam  Not completed: Refused  Orientation to time 4 5  Orientation to Place 5 5  Registration 3 3  Attention/ Calculation 5 5  Recall 3 3  Language- name 2 objects 2 2  Language- repeat 1 1  Language- follow 3 step command 3 3  Language- read & follow direction 1 1  Write a sentence 1 1  Copy design 1 1  Total score 29 30        07/17/2023    3:38 PM 07/16/2022    5:01 PM 07/10/2021    9:55 AM 07/23/2019    3:07 PM 07/23/2019    2:47 PM  6CIT Screen  What Year? 0 points 0 points 0 points 0 points 0 points  What month? 0 points 0 points 0 points 0 points 0 points  What time? 0 points 0 points 0 points 0 points   Count back from 20 0 points 0 points 0 points 0 points   Months in reverse 0 points 2 points 2 points 0 points   Repeat phrase 2 points 2 points 4 points 0 points   Total Score 2 points 4 points 6 points 0 points     Immunizations Immunization History  Administered Date(s) Administered   Influenza Inj Mdck Quad  Pf 02/03/2018   Influenza, Quadrivalent, Recombinant, Inj, Pf 03/07/2017   Influenza, Seasonal, Injecte, Preservative Fre 03/26/2023   Influenza,inj,Quad PF,6+ Mos 05/27/2019, 02/29/2020, 01/17/2022   Influenza,trivalent, recombinat, inj, PF 03/04/2014, 01/19/2015   Influenza-Unspecified 03/11/2017, 03/03/2021   Moderna Covid-19 Fall Seasonal Vaccine 65yrs & older 05/14/2022, 03/19/2023   Moderna Covid-19 Vaccine Bivalent Booster 30yrs & up 02/21/2021   Moderna Sars-Covid-2 Vaccination 07/27/2019, 08/24/2019, 04/22/2020, 09/27/2020   Pneumococcal Conjugate-13 01/06/2016   Pneumococcal Polysaccharide-23 02/09/2015, 02/13/2018   Tdap 09/20/2015, 01/06/2016   Zoster Recombinant(Shingrix) 07/18/2021, 09/19/2021    Screening Tests Health Maintenance  Topic Date Due   Colonoscopy  05/13/2023   MAMMOGRAM  05/28/2024 (Originally 09/29/2021)   COVID-19 Vaccine (8 - Moderna risk 2024-25 season) 09/16/2023   Medicare Annual Wellness (AWV)  07/16/2024   Cervical Cancer Screening (HPV/Pap Cotest)  09/29/2024   DTaP/Tdap/Td (3 - Td or Tdap) 01/05/2026   Pneumococcal Vaccine 97-61 Years old (4 of 4 - PPSV23 or PCV20) 12/01/2029   INFLUENZA VACCINE  Completed   Hepatitis C Screening  Completed   HIV Screening  Completed   Zoster Vaccines- Shingrix  Completed   HPV VACCINES  Aged Out    Health Maintenance  Health Maintenance Due  Topic Date Due   Colonoscopy  05/13/2023   Health Maintenance Items Addressed: Wants to postpone colon   Additional Screening:  Vision Screening: Recommended annual ophthalmology exams for early detection of glaucoma and other disorders of the eye.  Dental Screening: Recommended annual dental exams for proper oral hygiene  Community Resource Referral / Chronic Care Management: CRR required this visit?  No   CCM required this visit?  No     Plan:     I have personally reviewed and noted the following in the patient's chart:   Medical and social  history Use of alcohol, tobacco or illicit drugs  Current medications and supplements including opioid prescriptions. Patient is not currently taking opioid prescriptions. Functional ability and status Nutritional status Physical activity Advanced directives List of other physicians Hospitalizations, surgeries, and ER visits in previous 12 months Vitals Screenings to include cognitive, depression, and falls Referrals and appointments  In addition, I have reviewed and discussed with patient certain preventive protocols, quality metrics, and best practice  recommendations. A written personalized care plan for preventive services as well as general preventive health recommendations were provided to patient.     Kandis Fantasia [place], [place]   [id number]/82/9562   After Visit Summary: (MyChart) Due to this being a telephonic visit, the after visit summary with patients personalized plan was offered to patient via MyChart   Notes: Nothing significant to report at this time.

## 2023-07-22 ENCOUNTER — Ambulatory Visit: Payer: 59 | Admitting: Family Medicine

## 2023-07-23 DIAGNOSIS — Z720 Tobacco use: Secondary | ICD-10-CM | POA: Diagnosis not present

## 2023-07-23 DIAGNOSIS — R6889 Other general symptoms and signs: Secondary | ICD-10-CM | POA: Diagnosis not present

## 2023-07-23 DIAGNOSIS — J384 Edema of larynx: Secondary | ICD-10-CM | POA: Diagnosis not present

## 2023-07-23 DIAGNOSIS — J381 Polyp of vocal cord and larynx: Secondary | ICD-10-CM | POA: Diagnosis not present

## 2023-07-23 DIAGNOSIS — K219 Gastro-esophageal reflux disease without esophagitis: Secondary | ICD-10-CM | POA: Diagnosis not present

## 2023-07-23 DIAGNOSIS — F17210 Nicotine dependence, cigarettes, uncomplicated: Secondary | ICD-10-CM | POA: Diagnosis not present

## 2023-07-23 DIAGNOSIS — F17290 Nicotine dependence, other tobacco product, uncomplicated: Secondary | ICD-10-CM | POA: Diagnosis not present

## 2023-07-23 DIAGNOSIS — R490 Dysphonia: Secondary | ICD-10-CM | POA: Diagnosis not present

## 2023-07-23 DIAGNOSIS — R0989 Other specified symptoms and signs involving the circulatory and respiratory systems: Secondary | ICD-10-CM | POA: Diagnosis not present

## 2023-07-23 DIAGNOSIS — J370 Chronic laryngitis: Secondary | ICD-10-CM | POA: Diagnosis not present

## 2023-08-11 DIAGNOSIS — R0902 Hypoxemia: Secondary | ICD-10-CM | POA: Diagnosis not present

## 2023-08-11 DIAGNOSIS — G4733 Obstructive sleep apnea (adult) (pediatric): Secondary | ICD-10-CM | POA: Diagnosis not present

## 2023-08-11 DIAGNOSIS — J449 Chronic obstructive pulmonary disease, unspecified: Secondary | ICD-10-CM | POA: Diagnosis not present

## 2023-08-15 ENCOUNTER — Ambulatory Visit: Admitting: Family Medicine

## 2023-08-19 NOTE — Progress Notes (Signed)
 No show

## 2023-08-22 ENCOUNTER — Telehealth: Payer: Self-pay | Admitting: Neurology

## 2023-08-22 NOTE — Telephone Encounter (Signed)
 r/s  sleep consult appointment

## 2023-08-27 ENCOUNTER — Ambulatory Visit (INDEPENDENT_AMBULATORY_CARE_PROVIDER_SITE_OTHER): Payer: Self-pay | Admitting: Psychiatry

## 2023-08-27 DIAGNOSIS — Z91199 Patient's noncompliance with other medical treatment and regimen due to unspecified reason: Secondary | ICD-10-CM

## 2023-09-04 NOTE — Telephone Encounter (Signed)
 Called the patient and advised her to bring the machine and power cord to the visit tomorrow. She states that she needs to cancel due to having another apt that she has to attend and no one able to bring her and help her carry the machine in. Advised I would send this to the scheduler team to assess if she can be rescheduled. I informed her on no show fee.

## 2023-09-05 ENCOUNTER — Institutional Professional Consult (permissible substitution): Admitting: Neurology

## 2023-09-10 DIAGNOSIS — J449 Chronic obstructive pulmonary disease, unspecified: Secondary | ICD-10-CM | POA: Diagnosis not present

## 2023-09-10 DIAGNOSIS — G4733 Obstructive sleep apnea (adult) (pediatric): Secondary | ICD-10-CM | POA: Diagnosis not present

## 2023-09-10 DIAGNOSIS — R0902 Hypoxemia: Secondary | ICD-10-CM | POA: Diagnosis not present

## 2023-09-23 ENCOUNTER — Other Ambulatory Visit: Payer: Self-pay | Admitting: Psychiatry

## 2023-09-23 NOTE — Telephone Encounter (Signed)
 Please contact her to offer an in-person visit, if she's interested. She previously mentioned that she was planning to find another provider in the [place] area,fyi.

## 2023-09-24 ENCOUNTER — Ambulatory Visit: Payer: Self-pay

## 2023-09-24 ENCOUNTER — Ambulatory Visit (INDEPENDENT_AMBULATORY_CARE_PROVIDER_SITE_OTHER): Admitting: Nurse Practitioner

## 2023-09-24 ENCOUNTER — Encounter: Payer: Self-pay | Admitting: Nurse Practitioner

## 2023-09-24 VITALS — BP 108/71 | HR 85 | Temp 97.8°F | Ht 66.0 in | Wt 223.6 lb

## 2023-09-24 DIAGNOSIS — R600 Localized edema: Secondary | ICD-10-CM | POA: Diagnosis not present

## 2023-09-24 MED ORDER — FUROSEMIDE 20 MG PO TABS: ORAL_TABLET | ORAL | 0 refills | Status: AC

## 2023-09-24 NOTE — Telephone Encounter (Signed)
 Mailbox is full. Sent mychart message

## 2023-09-24 NOTE — Telephone Encounter (Signed)
  Chief Complaint: Leg Swelling Symptoms: Left leg and foot swelling greater than right leg and foot. Swelling from knee down to foot. Pain in feet from swelling Frequency: started last Thursday Pertinent Negatives: Patient denies CP, SOB, fever Disposition: [] ED /[] Urgent Care (no appt availability in office) / [x] Appointment(In office/virtual)/ []  [facility]/ [] Home Care/ [] Refused Recommended Disposition /[] [facility]/ []  Follow-up with PCP Additional Notes: patient calling with concerns for leg and foot swelling along with pain to feet. Patient states since last Thursday she has been having swelling to feet and lower legs. Endorses worsening swelling to left leg and foot. Patient reports pain level of 8 out of 10. Hx of a stroke. Patient is recommended to be seen within 24 hours. Patient requesting to be seen today in office. Appointment scheduled for today, 09/24/2023, at 2:00 PM with another provider in office. Patient verbalized understanding of the plan and all questions answered.    Copied from CRM [id number]. Topic: Clinical - Red Word Triage >> Sep 24, 2023  9:37 AM Loreda Rodriguez T wrote: Red Word that prompted transfer to Nurse Triage: swollen feet and a lot of pain to the point of not being able to walk. Reason for Disposition  [1] MODERATE leg swelling (e.g., swelling extends up to knees) AND [2] new-onset or worsening  Answer Assessment - Initial Assessment Questions 1. ONSET: "When did the swelling start?" (e.g., minutes, hours, days)     Swelling started last Thursday 2. LOCATION: "What part of the leg is swollen?"  "Are both legs swollen or just one leg?"     Both legs are swollen from the knee down to foot-patient endorses that the left leg and foot are worse than the right 3. SEVERITY: "How bad is the swelling?" (e.g., localized; mild, moderate, severe)   - Localized: Small area of swelling localized to one leg.   - MILD pedal edema: Swelling limited to foot  and ankle, pitting edema < 1/4 inch (6 mm) deep, rest and elevation eliminate most or all swelling.   - MODERATE edema: Swelling of lower leg to knee, pitting edema > 1/4 inch (6 mm) deep, rest and elevation only partially reduce swelling.   - SEVERE edema: Swelling extends above knee, facial or hand swelling present.      Moderate 4. REDNESS: "Does the swelling look red or infected?"     no 5. PAIN: "Is the swelling painful to touch?" If Yes, ask: "How painful is it?"   (Scale 1-10; mild, moderate or severe)     8 out of 10 6. FEVER: "Do you have a fever?" If Yes, ask: "What is it, how was it measured, and when did it start?"      no 7. CAUSE: "What do you think is causing the leg swelling?"     unsure 8. MEDICAL HISTORY: "Do you have a history of blood clots (e.g., DVT), cancer, heart failure, kidney disease, or liver failure?"     stroke 9. RECURRENT SYMPTOM: "Have you had leg swelling before?" If Yes, ask: "When was the last time?" "What happened that time?"     no 10. OTHER SYMPTOMS: "Do you have any other symptoms?" (e.g., chest pain, difficulty breathing)       no  Protocols used: Leg Swelling and Edema-A-AH

## 2023-09-24 NOTE — Telephone Encounter (Signed)
 Appt made

## 2023-09-24 NOTE — Progress Notes (Signed)
 Acute Office Visit  Subjective:     Patient ID: Rebecca Moreno, female    DOB: [birth year], 58 y.o.   MRN: [id number]  Chief Complaint  Patient presents with   Leg Swelling    Bilateral leg and feet swelling since Thursday, pain when walking     HPI Rebecca Moreno is a 58 year old female who presents with her daughter on Sep 24, 2023, for an acute visit due to concern for bilateral lower extremity edema. The patient reports that the swelling began last Thursday and has progressively worsened. She also reports intermittent shortness of breath with exertion. Her daughter expresses concern about the possibility of sleep apnea due to observed breathing irregularities during sleep. The patient denies chest pain. She has a scheduled appointment with her primary care provider next week and was advised to discuss the concern for sleep apnea at that visit.  Active Ambulatory Problems    Diagnosis Date Noted   Post-menopausal bleeding 01/15/2018   Acquired hypothyroidism 01/15/2018   History of gastric ulcer 01/15/2018   Gastroesophageal reflux disease with esophagitis 01/15/2018   Psoriasis 01/21/2018   Osteoporosis 01/21/2018   History of colonic polyps 04/07/2018   GERD (gastroesophageal reflux disease) 04/07/2018   Mood disorder in conditions classified elsewhere 04/14/2018   Elevated blood pressure reading 05/19/2018   Nocturnal hypoxia 09/03/2018   Erythrocytosis 01/05/2019   Stage 3a chronic kidney disease (HCC) 08/19/2019   Spondylosis of lumbar spine 08/19/2019   Obesity (BMI 30-39.9) 08/19/2019   Late effect of cerebrovascular accident (CVA) 09/25/2019   Tobacco use 06/01/2020   Amnesia 12/14/2019   Fatigue 12/14/2019   Intracranial atherosclerosis 11/11/2019   LPRD (laryngopharyngeal reflux disease) 06/05/2022   OSA treated with BiPAP 05/29/2023   GAD (generalized anxiety disorder) 05/29/2023   Current moderate episode of major depressive disorder without prior episode  (HCC) 05/29/2023   Edema of both lower extremities 09/24/2023   Resolved Ambulatory Problems    Diagnosis Date Noted   Polyp of colon 01/15/2018   Bipolar disorder, in partial remission, most recent episode mixed (HCC) 01/15/2018   Elevated transaminase level 04/07/2018   Active bleeding 04/07/2018   Rectal bleeding 04/07/2018   Obstructive pyelonephritis 09/01/2018   Asthma with status asthmaticus    Atelectasis 09/03/2018   Sepsis due to Escherichia coli (E. coli) (HCC) 09/04/2018   Acute pyelonephritis    Bipolar 1 disorder, mixed, full remission (HCC) 06/02/2019   Cerebrovascular accident (CVA) due to occlusion of left carotid artery (HCC) 09/18/2019   Hypoxemia 12/10/2019   Status post stroke 09/16/2019   Past Medical History:  Diagnosis Date   Asthma    Bipolar disorder (HCC)    Colon polyp    Complication of anesthesia    Depression    Gastric ulcer    History of kidney stones    Hyperlipidemia    Hypothyroid    Polyp, uterus corpus    Sleep apnea    Stroke (HCC)     Review of Systems  Constitutional:  Negative for chills and fever.  HENT:  Negative for congestion and sore throat.   Respiratory:  Negative for cough, shortness of breath and wheezing.   Cardiovascular:  Positive for leg swelling. Negative for chest pain.  Gastrointestinal:  Negative for nausea and vomiting.  Skin:  Negative for rash.  Neurological:  Negative for dizziness and headaches.   Negative unless indicated in HPI    Objective:    BP 108/71   Pulse 85  Temp 97.8 F (36.6 C) (Temporal)   Ht 5\' 6"  (1.676 m)   Wt 223 lb 9.6 oz (101.4 kg)   LMP 12/13/2017 (Approximate)   SpO2 (!) 89%   BMI 36.09 kg/m  BP Readings from Last 3 Encounters:  09/24/23 108/71  05/29/23 135/81  12/03/22 (!) 108/52   Wt Readings from Last 3 Encounters:  09/24/23 223 lb 9.6 oz (101.4 kg)  07/17/23 209 lb (94.8 kg)  05/29/23 206 lb (93.4 kg)      Physical Exam Vitals and nursing note reviewed.   Constitutional:      General: She is not in acute distress. HENT:     Head: Normocephalic and atraumatic.     Nose: Nose normal.     Mouth/Throat:     Mouth: Mucous membranes are moist.  Eyes:     General: No scleral icterus.    Extraocular Movements: Extraocular movements intact.     Conjunctiva/sclera: Conjunctivae normal.     Pupils: Pupils are equal, round, and reactive to light.  Cardiovascular:     Heart sounds: Normal heart sounds.  Pulmonary:     Effort: Pulmonary effort is normal.     Breath sounds: Normal breath sounds. No stridor. No wheezing or rhonchi.  Musculoskeletal:     Right lower leg: 2+ Pitting Edema present.     Left lower leg: 2+ Pitting Edema present.  Neurological:     Mental Status: She is alert and oriented to person, place, and time.  Psychiatric:        Mood and Affect: Mood normal.        Behavior: Behavior normal.        Thought Content: Thought content normal.        Judgment: Judgment normal.     No results found for any visits on 09/24/23.      Assessment & Plan:  Edema of both lower extremities -     Furosemide; Take 1 tab daily for 5 days, okay to take PRN for 7 days  Dispense: 30 tablet; Refill: 0    Rebecca Moreno is a 58 year old Caucasian female seen today for bilateral lower extremity edema, no acute distress Will start her on Lasix 20 mg for 5 days and as needed for up to 7 days TED hose/compression socks during the day and remove it at night Low-sodium diet     The above assessment and management plan was discussed with the patient. The patient verbalized understanding of and has agreed to the management plan. Patient is aware to call the clinic if they develop any new symptoms or if symptoms persist or worsen. Patient is aware when to return to the clinic for a follow-up visit. Patient educated on when it is appropriate to go to the emergency department.  Return for with PCP as already schduled .  Cire Deyarmin [place] Thompson,  DNP [facility] [address] [place], [place] [address] [phone]  Note: This document was prepared by [employer] voice dictation technology and any errors that results from this process are unintentional.

## 2023-09-28 ENCOUNTER — Other Ambulatory Visit: Payer: Self-pay | Admitting: Psychiatry

## 2023-09-28 NOTE — Telephone Encounter (Signed)
 Please contact her to make a follow up appointment, in person for 30 mins.

## 2023-10-01 ENCOUNTER — Other Ambulatory Visit: Payer: Self-pay | Admitting: Psychiatry

## 2023-10-01 ENCOUNTER — Telehealth: Payer: Self-pay | Admitting: Psychiatry

## 2023-10-01 ENCOUNTER — Other Ambulatory Visit: Payer: Self-pay | Admitting: Family Medicine

## 2023-10-01 DIAGNOSIS — Z1231 Encounter for screening mammogram for malignant neoplasm of breast: Secondary | ICD-10-CM

## 2023-10-01 NOTE — Telephone Encounter (Signed)
 Left a MyChart message to call and schedule a 30 minute IP follow up

## 2023-10-01 NOTE — Telephone Encounter (Signed)
 Received a request from provider to schedule a 30 minute In Person follow up. Her voicemail did not work and a Wellsite geologist was left

## 2023-10-02 ENCOUNTER — Encounter: Payer: Self-pay | Admitting: Family Medicine

## 2023-10-02 ENCOUNTER — Ambulatory Visit: Admitting: Family Medicine

## 2023-10-02 ENCOUNTER — Ambulatory Visit
Admission: RE | Admit: 2023-10-02 | Discharge: 2023-10-02 | Disposition: A | Discharge status: Home / Self Care | Source: Ambulatory Visit | Attending: Family Medicine | Admitting: Family Medicine

## 2023-10-02 VITALS — BP 138/88 | HR 86 | Temp 97.9°F | Ht 66.0 in | Wt 219.0 lb

## 2023-10-02 DIAGNOSIS — M5431 Sciatica, right side: Secondary | ICD-10-CM | POA: Diagnosis not present

## 2023-10-02 DIAGNOSIS — R600 Localized edema: Secondary | ICD-10-CM

## 2023-10-02 DIAGNOSIS — Z1231 Encounter for screening mammogram for malignant neoplasm of breast: Secondary | ICD-10-CM

## 2023-10-02 DIAGNOSIS — L409 Psoriasis, unspecified: Secondary | ICD-10-CM

## 2023-10-02 MED ORDER — BETAMETHASONE VALERATE 0.1 % EX OINT: TOPICAL_OINTMENT | Freq: Two times a day (BID) | CUTANEOUS | 5 refills | Status: AC | PRN

## 2023-10-02 MED ORDER — CALCIPOTRIENE 0.005 % EX CREA: TOPICAL_CREAM | Freq: Two times a day (BID) | CUTANEOUS | 0 refills | Status: AC

## 2023-10-02 MED ORDER — METHYLPREDNISOLONE ACETATE 40 MG/ML IJ SUSP
40.0000 mg | Freq: Once | INTRAMUSCULAR | Status: AC
  Administered 2023-10-02: 40 mg via INTRAMUSCULAR

## 2023-10-02 MED ORDER — CLOBETASOL PROPIONATE 0.05 % EX SOLN: 1.0000 | Freq: Two times a day (BID) | CUTANEOUS | 2 refills | Status: AC | PRN

## 2023-10-02 MED ORDER — TIZANIDINE HCL 4 MG PO TABS: 2.0000 mg | ORAL_TABLET | Freq: Three times a day (TID) | ORAL | 0 refills | Status: DC | PRN

## 2023-10-02 NOTE — Progress Notes (Addendum)
 Subjective: CC: Psoriasis PCP: Rebecca Guerin, DO ZOX:WRUEA H Rebecca Moreno is a 58 y.o. female presenting to clinic today for:  1.  Psoriasis/ edema She reports that she has been having a severe psoriatic flare in her hands and feet such that she could barely walk due to pain in the feet.  She ran out of her topical betamethasone  cream.  Her daughter, who is a PA, notes that she worries that her mental health is actually exacerbated her psoriasis because there was a family member that died recently and she really just has not been doing well.  She sees a psychiatrist.  She also apparently experienced some leg edema that she was seen for about a week ago.  She was placed on Lasix but no labs were collected.  She notes the edema is better and she "lost a few pounds with the medication".  She just completed the Lasix a couple of days ago.  Denies any orthopnea, shortness of breath, chest pain  2.  Right-sided sciatica This has been an issue for her in the past.  She is asking for something to help with pain because the gabapentin  is not helping.  It is radiating down the right leg.   ROS: Per HPI  Allergies  Allergen Reactions   Lithium Other (See Comments)    Psoriasis    Past Medical History:  Diagnosis Date   Acute pyelonephritis    Asthma    Bipolar disorder (HCC)    Cerebrovascular accident (CVA) due to occlusion of left carotid artery (HCC) 09/18/2019   Last Assessment & Plan:   Formatting of this note might be different from the original.     Colon polyp    Complication of anesthesia    "sometimes hard to put to sleep"   Depression    Bipolar   Gastric ulcer    around 2013.  stress related   GERD (gastroesophageal reflux disease)    History of kidney stones    Hyperlipidemia    Hypothyroid    Polyp, uterus corpus    Sepsis due to Escherichia coli (E. coli) (HCC) 09/04/2018   Sleep apnea    no cpap used   Stroke [facility])     Current Outpatient Medications:     albuterol  (VENTOLIN  HFA) 108 (90 Base) MCG/ACT inhaler, Inhale 1 puff into the lungs every 6 (six) hours as needed for wheezing or shortness of breath., Disp: 6.7 g, Rfl: 1   atorvastatin  (LIPITOR) 80 MG tablet, Take 1 tablet (80 mg total) by mouth at bedtime., Disp: 100 tablet, Rfl: 3   betamethasone  valerate ointment (VALISONE ) 0.1 %, Apply topically 2 (two) times daily as needed., Disp: 45 g, Rfl: 1   CALCIUM  CITRATE-VITAMIN D3 PO, Take 1 tablet by mouth daily., Disp: , Rfl:    Cariprazine  HCl (VRAYLAR ) 6 MG CAPS, Take 1 capsule (6 mg total) by mouth daily., Disp: 90 capsule, Rfl: 1   Cholecalciferol (VITAMIN D3) 5000 units TABS, Take 5,000 Units by mouth daily. , Disp: , Rfl:    clonazePAM  (KLONOPIN ) 1 MG tablet, Take 1 tablet (1 mg total) by mouth 2 (two) times daily., Disp: 60 tablet, Rfl: 0   Dexlansoprazole  30 MG capsule DR, TAKE ONE CAPSULE DAILY, Disp: 90 capsule, Rfl: 1   famotidine  (PEPCID ) 20 MG tablet, TAKE ONE TABLET BY MOUTH EVERY DAY, Disp: 90 tablet, Rfl: 0   FLOVENT  HFA 220 MCG/ACT inhaler, INHALE 2 PUFFS TWICE DAILY, Disp: 12 g, Rfl: 0   fluticasone  (  FLONASE ) 50 MCG/ACT nasal spray, SPRAY ONE SPRAY IN EACH NOSTRIL DAILY AT night TO help with morning congestion, Disp: 16 g, Rfl: 5   furosemide (LASIX) 20 MG tablet, Take 1 tab daily for 5 days, okay to take PRN for 7 days, Disp: 30 tablet, Rfl: 0   gabapentin  (NEURONTIN ) 300 MG capsule, Take 1 capsule at supper and 1 capsule at bedtime., Disp: 180 capsule, Rfl: 1   lamoTRIgine  (LAMICTAL ) 100 MG tablet, Take 3 tablets (300 mg total) by mouth at bedtime., Disp: 270 tablet, Rfl: 1   levothyroxine  (SYNTHROID ) 50 MCG tablet, TAKE ONE TABLET DAILY, Disp: 90 tablet, Rfl: 2   loratadine  (CLARITIN ) 10 MG tablet, TAKE ONE TABLET DAILY, Disp: 30 tablet, Rfl: 5   magnesium gluconate (MAGONATE) 500 MG tablet, Take 500 mg by mouth daily., Disp: , Rfl:    Multiple Vitamins-Minerals (MULTIVITAMIN WOMEN PO), Take 1 tablet by mouth daily., Disp: ,  Rfl:    QUEtiapine  (SEROQUEL ) 200 MG tablet, Take 1 tablet (200 mg total) by mouth at bedtime., Disp: 90 tablet, Rfl: 0   tiZANidine  (ZANAFLEX ) 4 MG tablet, Take 0.5-1 tablets (2-4 mg total) by mouth every 8 (eight) hours as needed for muscle spasms., Disp: 60 tablet, Rfl: 0   triamcinolone  cream (KENALOG ) 0.1 %, Apply 1 Application topically 2 (two) times daily., Disp: 80 g, Rfl: 0   venlafaxine  XR (EFFEXOR -XR) 150 MG 24 hr capsule, Take 2 capsules (300 mg total) by mouth daily., Disp: 180 capsule, Rfl: 1 Social History   Socioeconomic History   Marital status: Married    Spouse name: Not on file   Number of children: 1   Years of education: Not on file   Highest education level: High school graduate  Occupational History   Occupation: disability  Tobacco Use   Smoking status: Every Day    Current packs/day: 0.50    Average packs/day: 0.5 packs/day for 30.0 years (15.0 ttl pk-yrs)    Types: Cigarettes   Smokeless tobacco: Never   Tobacco comments:    Is smoking about 0.5 of a pack and has a patch placed on   Vaping Use   Vaping status: Never Used  Substance and Sexual Activity   Alcohol use: Never   Drug use: Not Currently   Sexual activity: Yes    Partners: Male    Birth control/protection: Surgical    Comment: tubal  Other Topics Concern   Not on file  Social History Narrative   Not on file   Social Drivers of Health   Financial Resource Strain: Low Risk  (07/17/2023)   Overall Financial Resource Strain (CARDIA)    Difficulty of Paying Living Expenses: Not hard at all  Food Insecurity: No Food Insecurity (07/17/2023)   Hunger Vital Sign    Worried About Running Out of Food in the Last Year: Never true    Ran Out of Food in the Last Year: Never true  Recent Concern: Food Insecurity - Food Insecurity Present (05/29/2023)   Hunger Vital Sign    Worried About Running Out of Food in the Last Year: Sometimes true    Ran Out of Food in the Last Year: Sometimes true   Transportation Needs: No Transportation Needs (07/17/2023)   PRAPARE - Administrator, Civil Service (Medical): No    Lack of Transportation (Non-Medical): No  Physical Activity: Inactive (07/17/2023)   Exercise Vital Sign    Days of Exercise per Week: 0 days    Minutes of Exercise  per Session: 0 min  Stress: No Stress Concern Present (07/17/2023)   [employer] of Occupational Health - Occupational Stress Questionnaire    Feeling of Stress : Not at all  Social Connections: Socially Integrated (07/17/2023)   Social Connection and Isolation Panel [NHANES]    Frequency of Communication with Friends and Family: More than three times a week    Frequency of Social Gatherings with Friends and Family: Three times a week    Attends Religious Services: More than 4 times per year    Active Member of Clubs or Organizations: Yes    Attends Banker Meetings: More than 4 times per year    Marital Status: Married  Recent Concern: Social Connections - Moderately Isolated (05/29/2023)   Social Connection and Isolation Panel [NHANES]    Frequency of Communication with Friends and Family: More than three times a week    Frequency of Social Gatherings with Friends and Family: More than three times a week    Attends Religious Services: Never    Database administrator or Organizations: No    Attends Banker Meetings: Never    Marital Status: Married  Catering manager Violence: Not At Risk (07/17/2023)   Humiliation, Afraid, Rape, and Kick questionnaire    Fear of Current or Ex-Partner: No    Emotionally Abused: No    Physically Abused: No    Sexually Abused: No   Family History  Problem Relation Age of Onset   Anxiety disorder Mother    Hypertension Mother    Heart failure Mother    Stroke Mother    Hyperlipidemia Father    Diabetes Father    Stroke Father    Hypertension Father    Colon polyps Father        older than 68   Diabetes Sister     Hypertension Sister    Ovarian cancer Sister    Ovarian cancer Maternal Grandmother    Colon cancer Maternal Grandmother    Renal Disease Paternal Grandmother    Heart attack Paternal Grandfather    Asthma Daughter    Bipolar disorder Other    Asthma Niece     Objective: Office vital signs reviewed. BP 138/88   Pulse 86   Temp 97.9 F (36.6 C)   Ht 5\' 6"  (1.676 m)   Wt 219 lb (99.3 kg)   LMP 12/13/2017 (Approximate)   SpO2 91%   BMI 35.35 kg/m   Physical Examination:  General: Awake, alert, nontoxic-appearing female, No acute distress HEENT: sclera white, MMM Cardio: regular rate and rhythm, S1S2 heard, no murmurs appreciated Pulm: clear to auscultation bilaterally, no wheezes, rhonchi or rales; normal work of breathing on room air Extremities: warm, well perfused, No edema, cyanosis or clubbing; +2 pulses bilaterally MSK: mildly antalgic gait.  Ambulating independently Skin: Psoriatic plaques appreciated along bilateral plantar aspects of the feet and palmar aspects hands.  She has plaques along her forehead and scalp  Assessment/ Plan: 58 y.o. female   Psoriasis - Plan: betamethasone  valerate ointment (VALISONE ) 0.1 %, clobetasol (TEMOVATE) 0.05 % external solution, calcipotriene  (DOVONEX ) 0.005 % cream, Ambulatory referral to Dermatology  Edema of both lower extremities - Plan: Basic Metabolic Panel, Brain natriuretic peptide, TSH + free T4, CBC  Sciatica of right side - Plan: methylPREDNISolone acetate (DEPO-MEDROL) injection 40 mg, tiZANidine  (ZANAFLEX ) 4 MG tablet  Psoriasis is very flared.  I am going to place her on Dovonex  and betamethasone .  I have also placed a referral  to dermatology to discuss possible systemic treatment given severity.  She also has clobetasol to use on the scalp which had active lesions during today's visit  Down 4lbs since 09/24/23.  Will collect metabolic labs to further evaluate the edema.  She really did not have any edema on exam today  and so she seems like she is responding to Lasix.  There was no evidence of fluid overload at all  Given Depo-Medrol injection intramuscularly to help with the sciatica and I also renewed her Zanaflex .  Today's office visit evaluation and information was transmitted back to her daughter via patient's personal MyChart   Rebecca Moreno Rebecca Bonine, DO [facility] [phone]

## 2023-10-02 NOTE — Addendum Note (Signed)
 Addended by: Eliodoro Guerin on: 10/02/2023 03:40 PM   Modules accepted: Orders

## 2023-10-03 ENCOUNTER — Other Ambulatory Visit: Payer: Self-pay | Admitting: Psychiatry

## 2023-10-03 ENCOUNTER — Telehealth: Payer: Self-pay

## 2023-10-03 LAB — BASIC METABOLIC PANEL WITH GFR
BUN/Creatinine Ratio: 16 (ref 9–23)
BUN: 18 mg/dL (ref 6–24)
CO2: 27 mmol/L (ref 20–29)
Calcium: 9.4 mg/dL (ref 8.7–10.2)
Chloride: 99 mmol/L (ref 96–106)
Creatinine, Ser: 1.16 mg/dL — ABNORMAL HIGH (ref 0.57–1.00)
Glucose: 115 mg/dL — ABNORMAL HIGH (ref 70–99)
Potassium: 5.4 mmol/L — ABNORMAL HIGH (ref 3.5–5.2)
Sodium: 142 mmol/L (ref 134–144)
eGFR: 55 mL/min/{1.73_m2} — ABNORMAL LOW (ref >59)

## 2023-10-03 LAB — CBC
Hematocrit: 47.9 % — ABNORMAL HIGH (ref 34.0–46.6)
Hemoglobin: 16.1 g/dL — ABNORMAL HIGH (ref 11.1–15.9)
MCH: 33.9 pg — ABNORMAL HIGH (ref 26.6–33.0)
MCHC: 33.6 g/dL (ref 31.5–35.7)
MCV: 101 fL — ABNORMAL HIGH (ref 79–97)
Platelets: 260 10*3/uL (ref 150–450)
RBC: 4.75 x10E6/uL (ref 3.77–5.28)
RDW: 12.5 % (ref 11.7–15.4)
WBC: 8.8 10*3/uL (ref 3.4–10.8)

## 2023-10-03 LAB — BRAIN NATRIURETIC PEPTIDE: BNP: 10.7 pg/mL (ref 0.0–100.0)

## 2023-10-03 LAB — TSH+FREE T4
Free T4: 1.07 ng/dL (ref 0.82–1.77)
TSH: 1.72 u[IU]/mL (ref 0.450–4.500)

## 2023-10-03 MED ORDER — QUETIAPINE FUMARATE 200 MG PO TABS: 200.0000 mg | ORAL_TABLET | Freq: Every day | ORAL | 0 refills | Status: DC

## 2023-10-03 NOTE — Telephone Encounter (Signed)
 pt called needs refill on the quetiapine . pt needed an appt so front desk called and pt has appt set up for 7-14 last seen on 2-25

## 2023-10-03 NOTE — Telephone Encounter (Signed)
 tried to call pt but no answer and mailbox was full could not leave a message.

## 2023-10-03 NOTE — Telephone Encounter (Signed)
 Pt.notified

## 2023-10-03 NOTE — Telephone Encounter (Signed)
 Ordered

## 2023-10-04 ENCOUNTER — Telehealth: Payer: Self-pay

## 2023-10-04 ENCOUNTER — Ambulatory Visit: Payer: Self-pay | Admitting: Family Medicine

## 2023-10-04 NOTE — Telephone Encounter (Signed)
 Copied from CRM [id number]. Topic: Clinical - Lab/Test Results >> Oct 04, 2023 12:00 PM Tiffany H wrote: Reason for CRM: Patient called to review lab results. Agent reviewed. Patient waiting on callback regarding continuation of Lasix.

## 2023-10-04 NOTE — Telephone Encounter (Signed)
 Pt aware she doesn't need to continue with Lasix.

## 2023-10-04 NOTE — Telephone Encounter (Signed)
 E2c2 agent gave pt results but pt is asking how to take medication. Please call back

## 2023-10-04 NOTE — Telephone Encounter (Signed)
 Does pt need to continue Lasix-your OV notes states no evidence of fluid overload so doesn't look like you wanted her to continue but wanted to verify?

## 2023-10-04 NOTE — Telephone Encounter (Signed)
 No she does not

## 2023-10-08 ENCOUNTER — Telehealth: Payer: Self-pay | Admitting: Family Medicine

## 2023-10-08 DIAGNOSIS — R928 Other abnormal and inconclusive findings on diagnostic imaging of breast: Secondary | ICD-10-CM

## 2023-10-08 NOTE — Telephone Encounter (Signed)
 I do not see an Order for this Patient at this time.

## 2023-10-08 NOTE — Telephone Encounter (Unsigned)
 Copied from CRM [id number]. Topic: General - Other >> Oct 08, 2023  2:27 PM Carlatta H wrote: Reason for CRM: Patient would like to schedule mammogram//

## 2023-10-08 NOTE — Telephone Encounter (Signed)
 Can you get this set up for her at wherever she wants to go?

## 2023-10-09 NOTE — Telephone Encounter (Signed)
 Patient needs diagnostic mammogram - never done follow up in 2021. Patient request to go to [facility], orders placed and Amalia Badder at [facility] will contact patient to schedule appt

## 2023-10-10 ENCOUNTER — Other Ambulatory Visit: Payer: Self-pay | Admitting: Psychiatry

## 2023-10-11 DIAGNOSIS — R0902 Hypoxemia: Secondary | ICD-10-CM | POA: Diagnosis not present

## 2023-10-11 DIAGNOSIS — G4733 Obstructive sleep apnea (adult) (pediatric): Secondary | ICD-10-CM | POA: Diagnosis not present

## 2023-10-11 DIAGNOSIS — J449 Chronic obstructive pulmonary disease, unspecified: Secondary | ICD-10-CM | POA: Diagnosis not present

## 2023-10-14 ENCOUNTER — Telehealth: Payer: Self-pay | Admitting: Psychiatry

## 2023-10-14 ENCOUNTER — Encounter

## 2023-10-14 DIAGNOSIS — F259 Schizoaffective disorder, unspecified: Secondary | ICD-10-CM

## 2023-10-14 MED ORDER — VRAYLAR 6 MG PO CAPS: 6.0000 mg | ORAL_CAPSULE | Freq: Every day | ORAL | 0 refills | Status: DC

## 2023-10-14 NOTE — Telephone Encounter (Signed)
 I have sent Vraylar  6 mg to pharmacy for 30 days supply to bridge until her next visit.

## 2023-10-16 ENCOUNTER — Inpatient Hospital Stay
Admission: RE | Admit: 2023-10-16 | Discharge: 2023-10-16 | Disposition: A | Discharge status: Home / Self Care | Payer: Self-pay | Source: Ambulatory Visit | Attending: Family Medicine | Admitting: Family Medicine

## 2023-10-16 ENCOUNTER — Other Ambulatory Visit: Payer: Self-pay | Admitting: Family Medicine

## 2023-10-16 DIAGNOSIS — R928 Other abnormal and inconclusive findings on diagnostic imaging of breast: Secondary | ICD-10-CM

## 2023-10-21 ENCOUNTER — Telehealth: Payer: Self-pay | Admitting: Family Medicine

## 2023-10-21 NOTE — Telephone Encounter (Signed)
 Copied from CRM [id number]. Topic: Referral - Status >> Oct 21, 2023 11:07 AM Rennis Case wrote: Reason for CRM: Pt inquiring about Dermatology referral. Pt states she is needing to see a dermatologist as soon as possible  Pt requesting a call back

## 2023-10-21 NOTE — Telephone Encounter (Signed)
 Referral has been sent to Dr. Myrtie Atkinson at this time as Patient requested.  MyChart Message sent to Patient with Specialty Office contact information.

## 2023-11-04 ENCOUNTER — Telehealth: Payer: Self-pay

## 2023-11-04 ENCOUNTER — Other Ambulatory Visit: Payer: Self-pay | Admitting: Psychiatry

## 2023-11-04 ENCOUNTER — Other Ambulatory Visit: Payer: Self-pay | Admitting: Family Medicine

## 2023-11-04 DIAGNOSIS — F259 Schizoaffective disorder, unspecified: Secondary | ICD-10-CM

## 2023-11-04 DIAGNOSIS — M5431 Sciatica, right side: Secondary | ICD-10-CM

## 2023-11-04 MED ORDER — VRAYLAR 6 MG PO CAPS: 6.0000 mg | ORAL_CAPSULE | Freq: Every day | ORAL | 0 refills | Status: DC

## 2023-11-04 NOTE — Telephone Encounter (Signed)
 Ordered

## 2023-11-04 NOTE — Telephone Encounter (Signed)
 received fax requesting a refill on the vraylar . pt was last seen on 02-25 next appt 7-14

## 2023-11-04 NOTE — Telephone Encounter (Unsigned)
 Copied from CRM [id number]. Topic: Clinical - Medication Refill >> Nov 04, 2023  2:59 PM Antwanette L wrote: Medication: tiZANidine  (ZANAFLEX ) 4 MG tablet   Has the patient contacted their pharmacy? No  This is the patient's preferred pharmacy:  [facility] [place], [place] - [address] [place] [address] Phone: [phone] Fax: [phone]  Is this the correct pharmacy for this prescription? Yes   Has the prescription been filled recently? Yes. Last refilled on 10/02/23  Is the patient out of the medication? Yes  Has the patient been seen for an appointment in the last year OR does the patient have an upcoming appointment? Yes. Last ov with Dr Jolinda was on 10/16/23 and next appt is on 11/26/23  Can we respond through MyChart? No. Contact the patient by phone at [phone]  Agent: Please be advised that Rx refills may take up to 3 business days. We ask that you follow-up with your pharmacy.

## 2023-11-04 NOTE — Telephone Encounter (Signed)
 Pt.notified

## 2023-11-05 MED ORDER — TIZANIDINE HCL 4 MG PO TABS: 2.0000 mg | ORAL_TABLET | Freq: Three times a day (TID) | ORAL | 0 refills | Status: DC | PRN

## 2023-11-07 ENCOUNTER — Other Ambulatory Visit: Payer: Self-pay | Admitting: Family Medicine

## 2023-11-07 ENCOUNTER — Ambulatory Visit (HOSPITAL_COMMUNITY)
Admission: RE | Admit: 2023-11-07 | Discharge: 2023-11-07 | Disposition: A | Discharge status: Home / Self Care | Source: Ambulatory Visit | Attending: Family Medicine | Admitting: Family Medicine

## 2023-11-07 ENCOUNTER — Encounter (HOSPITAL_COMMUNITY): Payer: Self-pay

## 2023-11-07 DIAGNOSIS — R928 Other abnormal and inconclusive findings on diagnostic imaging of breast: Secondary | ICD-10-CM | POA: Diagnosis not present

## 2023-11-07 DIAGNOSIS — R92323 Mammographic fibroglandular density, bilateral breasts: Secondary | ICD-10-CM | POA: Diagnosis not present

## 2023-11-07 DIAGNOSIS — N6323 Unspecified lump in the left breast, lower outer quadrant: Secondary | ICD-10-CM | POA: Diagnosis not present

## 2023-11-07 DIAGNOSIS — N6313 Unspecified lump in the right breast, lower outer quadrant: Secondary | ICD-10-CM | POA: Diagnosis not present

## 2023-11-07 DIAGNOSIS — N6009 Solitary cyst of unspecified breast: Secondary | ICD-10-CM | POA: Diagnosis not present

## 2023-11-10 ENCOUNTER — Ambulatory Visit: Payer: Self-pay | Admitting: Family Medicine

## 2023-11-10 DIAGNOSIS — G4733 Obstructive sleep apnea (adult) (pediatric): Secondary | ICD-10-CM | POA: Diagnosis not present

## 2023-11-10 DIAGNOSIS — R0902 Hypoxemia: Secondary | ICD-10-CM | POA: Diagnosis not present

## 2023-11-10 DIAGNOSIS — J449 Chronic obstructive pulmonary disease, unspecified: Secondary | ICD-10-CM | POA: Diagnosis not present

## 2023-11-11 ENCOUNTER — Other Ambulatory Visit: Payer: Self-pay | Admitting: Psychiatry

## 2023-11-12 ENCOUNTER — Other Ambulatory Visit: Payer: Self-pay | Admitting: Psychiatry

## 2023-11-14 ENCOUNTER — Ambulatory Visit (HOSPITAL_COMMUNITY)
Admission: RE | Admit: 2023-11-14 | Discharge: 2023-11-14 | Disposition: A | Discharge status: Home / Self Care | Source: Ambulatory Visit | Attending: Family Medicine | Admitting: Family Medicine

## 2023-11-14 ENCOUNTER — Encounter (HOSPITAL_COMMUNITY): Payer: Self-pay

## 2023-11-14 DIAGNOSIS — N6313 Unspecified lump in the right breast, lower outer quadrant: Secondary | ICD-10-CM | POA: Diagnosis not present

## 2023-11-14 DIAGNOSIS — R928 Other abnormal and inconclusive findings on diagnostic imaging of breast: Secondary | ICD-10-CM | POA: Insufficient documentation

## 2023-11-14 DIAGNOSIS — N6011 Diffuse cystic mastopathy of right breast: Secondary | ICD-10-CM | POA: Diagnosis not present

## 2023-11-14 HISTORY — PX: BREAST BIOPSY: SHX20

## 2023-11-14 MED ORDER — LIDOCAINE-EPINEPHRINE (PF) 1 %-1:200000 IJ SOLN
INTRAMUSCULAR | Status: AC
  Filled 2023-11-14: qty 30

## 2023-11-14 MED ORDER — LIDOCAINE-EPINEPHRINE (PF) 1 %-1:200000 IJ SOLN
30.0000 mL | Freq: Once | INTRAMUSCULAR | Status: AC
  Administered 2023-11-14: 10 mL via INTRADERMAL

## 2023-11-14 MED ORDER — LIDOCAINE HCL (PF) 2 % IJ SOLN
10.0000 mL | Freq: Once | INTRAMUSCULAR | Status: AC
  Administered 2023-11-14: 10 mL via INTRADERMAL

## 2023-11-14 MED ORDER — LIDOCAINE HCL (PF) 2 % IJ SOLN
INTRAMUSCULAR | Status: AC
  Filled 2023-11-14: qty 10

## 2023-11-14 NOTE — Progress Notes (Signed)
 Patient brought to US  RM3 in no acute distress. Brest biopsy explained, consent obtained, Prepped and draped with sterile technique. Local anesthetic admin without adverse event. Access obtained through Right breast under US  imaging. Samples obtained. Access removed without difficulty, bandage placed. No bleeding or hematoma evident. Assisted patient to radiology waiting following DC instructions.

## 2023-11-15 LAB — SURGICAL PATHOLOGY

## 2023-11-18 ENCOUNTER — Ambulatory Visit: Admitting: Psychiatry

## 2023-11-26 ENCOUNTER — Ambulatory Visit: Payer: Self-pay | Admitting: Family Medicine

## 2023-11-26 ENCOUNTER — Ambulatory Visit (INDEPENDENT_AMBULATORY_CARE_PROVIDER_SITE_OTHER): Payer: 59 | Admitting: Family Medicine

## 2023-11-26 ENCOUNTER — Encounter: Payer: Self-pay | Admitting: Family Medicine

## 2023-11-26 ENCOUNTER — Ambulatory Visit (INDEPENDENT_AMBULATORY_CARE_PROVIDER_SITE_OTHER)

## 2023-11-26 ENCOUNTER — Ambulatory Visit (INDEPENDENT_AMBULATORY_CARE_PROVIDER_SITE_OTHER): Payer: 59

## 2023-11-26 ENCOUNTER — Telehealth: Payer: Self-pay

## 2023-11-26 VITALS — BP 131/80 | HR 69 | Temp 98.8°F | Ht 66.0 in | Wt 234.0 lb

## 2023-11-26 DIAGNOSIS — Z87891 Personal history of nicotine dependence: Secondary | ICD-10-CM | POA: Insufficient documentation

## 2023-11-26 DIAGNOSIS — L409 Psoriasis, unspecified: Secondary | ICD-10-CM | POA: Diagnosis not present

## 2023-11-26 DIAGNOSIS — R0609 Other forms of dyspnea: Secondary | ICD-10-CM | POA: Diagnosis not present

## 2023-11-26 DIAGNOSIS — R0902 Hypoxemia: Secondary | ICD-10-CM

## 2023-11-26 DIAGNOSIS — Z780 Asymptomatic menopausal state: Secondary | ICD-10-CM

## 2023-11-26 DIAGNOSIS — J4530 Mild persistent asthma, uncomplicated: Secondary | ICD-10-CM | POA: Diagnosis not present

## 2023-11-26 DIAGNOSIS — M4807 Spinal stenosis, lumbosacral region: Secondary | ICD-10-CM | POA: Diagnosis not present

## 2023-11-26 DIAGNOSIS — M48061 Spinal stenosis, lumbar region without neurogenic claudication: Secondary | ICD-10-CM | POA: Diagnosis not present

## 2023-11-26 DIAGNOSIS — R918 Other nonspecific abnormal finding of lung field: Secondary | ICD-10-CM | POA: Diagnosis not present

## 2023-11-26 DIAGNOSIS — M5431 Sciatica, right side: Secondary | ICD-10-CM

## 2023-11-26 DIAGNOSIS — M47816 Spondylosis without myelopathy or radiculopathy, lumbar region: Secondary | ICD-10-CM | POA: Diagnosis not present

## 2023-11-26 DIAGNOSIS — M549 Dorsalgia, unspecified: Secondary | ICD-10-CM | POA: Diagnosis not present

## 2023-11-26 MED ORDER — ALBUTEROL SULFATE HFA 108 (90 BASE) MCG/ACT IN AERS
1.0000 | INHALATION_SPRAY | Freq: Four times a day (QID) | RESPIRATORY_TRACT | 1 refills | Status: AC | PRN
Start: 2023-11-26 — End: ?

## 2023-11-26 MED ORDER — BUDESONIDE-FORMOTEROL FUMARATE 80-4.5 MCG/ACT IN AERO: 2.0000 | INHALATION_SPRAY | Freq: Two times a day (BID) | RESPIRATORY_TRACT | 3 refills | Status: DC

## 2023-11-26 NOTE — Progress Notes (Signed)
 Subjective: CC: Low back pain with right-sided sciatica PCP: Jolinda Norene HERO, DO YEP:Rebecca Moreno is a 58 y.o. female presenting to clinic today for:  1.  Lumbar radiculopathy radiating to the right lower extremity She has had this in the past with MRI done November 2022.  At that time it showed mild bilateral foraminal stenosis with left slightly worse than right at the L5 on S1 vertebra.  She did not have any canal stenosis at any level.  She was seen May 28 for same and given a Depo-Medrol  injection in efforts to alleviate some pain.  She is chronically treated with gabapentin  but at that time it was not helping.  Zanaflex  renewed.  She presents today and notes that Zanaflex  did help slightly but stopped working.  She still gets really bad pain in the right lower extremity and lower lumbar area with bending.   2.  Shortness of breath Patient with dyspnea on exertion.  She is accompanied today's visit by her daughter who notes that she has been utilizing her Flovent  inhaler multiple times per day.  Does not have a rescue anymore.  She has been having some issues with lower extremity edema and took Lasix  but it really did not approve her edema much. She did have labs done including BNP which was normal.  Oxygenation in May visit was 91% but she has been dropping into the 80s at home.  She does have a history of nocturnal hypoxia and has O2 for that.  She is a former smoker but smoked 1/2 pack/day for over 40 years.  She stopped less than 1 year ago.  3.  Psoriasis She reports that she does have an appointment with the dermatologist at [facility] next month.  She is been utilizing the creams with the really not helping and she still has diffuse psoriatic changes to the feet bilaterally  ROS: Per HPI  Allergies  Allergen Reactions   Lithium Other (See Comments)    Psoriasis    Past Medical History:  Diagnosis Date   Acute pyelonephritis    Asthma    Bipolar disorder (HCC)     Cerebrovascular accident (CVA) due to occlusion of left carotid artery (HCC) 09/18/2019   Last Assessment & Plan:   Formatting of this note might be different from the original.     Colon polyp    Complication of anesthesia    sometimes hard to put to sleep   Depression    Bipolar   Gastric ulcer    around 2013.  stress related   GERD (gastroesophageal reflux disease)    History of kidney stones    Hyperlipidemia    Hypothyroid    Polyp, uterus corpus    Sepsis due to Escherichia coli (E. coli) (HCC) 09/04/2018   Sleep apnea    no cpap used   Stroke [facility])     Current Outpatient Medications:    budesonide -formoterol  (SYMBICORT ) 80-4.5 MCG/ACT inhaler, Inhale 2 puffs into the lungs 2 (two) times daily. Rinse mouth after each use. To replace flovent , Disp: 10.2 g, Rfl: 3   albuterol  (VENTOLIN  HFA) 108 (90 Base) MCG/ACT inhaler, Inhale 1 puff into the lungs every 6 (six) hours as needed for wheezing or shortness of breath., Disp: 6.7 g, Rfl: 1   atorvastatin  (LIPITOR) 80 MG tablet, Take 1 tablet (80 mg total) by mouth at bedtime., Disp: 100 tablet, Rfl: 3   betamethasone  valerate ointment (VALISONE ) 0.1 %, Apply topically 2 (two) times daily as needed.,  Disp: 45 g, Rfl: 5   calcipotriene  (DOVONEX ) 0.005 % cream, Apply topically 2 (two) times daily. For psoriasis, Disp: 60 g, Rfl: 0   CALCIUM  CITRATE-VITAMIN D3 PO, Take 1 tablet by mouth daily., Disp: , Rfl:    Cariprazine  HCl (VRAYLAR ) 6 MG CAPS, Take 1 capsule (6 mg total) by mouth daily., Disp: 30 capsule, Rfl: 0   Cholecalciferol (VITAMIN D3) 5000 units TABS, Take 5,000 Units by mouth daily. , Disp: , Rfl:    clobetasol  (TEMOVATE ) 0.05 % external solution, Apply 1 Application topically 2 (two) times daily as needed (psoriasis of scalp)., Disp: 50 mL, Rfl: 2   clonazePAM  (KLONOPIN ) 1 MG tablet, Take 1 tablet (1 mg total) by mouth 2 (two) times daily., Disp: 60 tablet, Rfl: 0   Dexlansoprazole  30 MG capsule DR, TAKE ONE CAPSULE DAILY,  Disp: 90 capsule, Rfl: 1   famotidine  (PEPCID ) 20 MG tablet, TAKE ONE TABLET BY MOUTH EVERY DAY, Disp: 90 tablet, Rfl: 0   fluticasone  (FLONASE ) 50 MCG/ACT nasal spray, SPRAY ONE SPRAY IN EACH NOSTRIL DAILY AT night TO help with morning congestion, Disp: 16 g, Rfl: 5   furosemide  (LASIX ) 20 MG tablet, Take 1 tab daily for 5 days, okay to take PRN for 7 days, Disp: 30 tablet, Rfl: 0   gabapentin  (NEURONTIN ) 300 MG capsule, Take 1 capsule at supper and 1 capsule at bedtime., Disp: 180 capsule, Rfl: 1   lamoTRIgine  (LAMICTAL ) 100 MG tablet, Take 3 tablets (300 mg total) by mouth at bedtime. 30 days only as a bridge, Disp: 90 tablet, Rfl: 0   levothyroxine  (SYNTHROID ) 50 MCG tablet, TAKE ONE TABLET DAILY, Disp: 90 tablet, Rfl: 3   loratadine  (CLARITIN ) 10 MG tablet, TAKE ONE TABLET DAILY, Disp: 30 tablet, Rfl: 5   magnesium gluconate (MAGONATE) 500 MG tablet, Take 500 mg by mouth daily., Disp: , Rfl:    Multiple Vitamins-Minerals (MULTIVITAMIN WOMEN PO), Take 1 tablet by mouth daily., Disp: , Rfl:    QUEtiapine  (SEROQUEL ) 200 MG tablet, Take 1 tablet (200 mg total) by mouth at bedtime., Disp: 90 tablet, Rfl: 0   tiZANidine  (ZANAFLEX ) 4 MG tablet, Take 0.5-1 tablets (2-4 mg total) by mouth every 8 (eight) hours as needed for muscle spasms., Disp: 60 tablet, Rfl: 0   triamcinolone  cream (KENALOG ) 0.1 %, Apply 1 Application topically 2 (two) times daily., Disp: 80 g, Rfl: 0   venlafaxine  XR (EFFEXOR -XR) 150 MG 24 hr capsule, Take 2 capsules (300 mg total) by mouth daily. 30 days only as a bridge, Disp: 60 capsule, Rfl: 0 Social History   Socioeconomic History   Marital status: Married    Spouse name: Not on file   Number of children: 1   Years of education: Not on file   Highest education level: High school graduate  Occupational History   Occupation: disability  Tobacco Use   Smoking status: Former    Current packs/day: 0.00    Average packs/day: 0.5 packs/day for 40.0 years (20.0 ttl pk-yrs)     Types: Cigarettes    Start date: 65    Quit date: 2024    Years since quitting: 1.5   Smokeless tobacco: Never   Tobacco comments:    Is smoking about 0.5 of a pack and has a patch placed on   Vaping Use   Vaping status: Never Used  Substance and Sexual Activity   Alcohol use: Never   Drug use: Not Currently   Sexual activity: Yes    Partners: Male  Birth control/protection: Surgical    Comment: tubal  Other Topics Concern   Not on file  Social History Narrative   Not on file   Social Drivers of Health   Financial Resource Strain: Low Risk  (07/17/2023)   Overall Financial Resource Strain (CARDIA)    Difficulty of Paying Living Expenses: Not hard at all  Food Insecurity: No Food Insecurity (07/17/2023)   Hunger Vital Sign    Worried About Running Out of Food in the Last Year: Never true    Ran Out of Food in the Last Year: Never true  Recent Concern: Food Insecurity - Food Insecurity Present (05/29/2023)   Hunger Vital Sign    Worried About Running Out of Food in the Last Year: Sometimes true    Ran Out of Food in the Last Year: Sometimes true  Transportation Needs: No Transportation Needs (07/17/2023)   PRAPARE - Administrator, Civil Service (Medical): No    Lack of Transportation (Non-Medical): No  Physical Activity: Inactive (07/17/2023)   Exercise Vital Sign    Days of Exercise per Week: 0 days    Minutes of Exercise per Session: 0 min  Stress: No Stress Concern Present (07/17/2023)   [employer] of Occupational Health - Occupational Stress Questionnaire    Feeling of Stress : Not at all  Social Connections: Socially Integrated (07/17/2023)   Social Connection and Isolation Panel    Frequency of Communication with Friends and Family: More than three times a week    Frequency of Social Gatherings with Friends and Family: Three times a week    Attends Religious Services: More than 4 times per year    Active Member of Clubs or Organizations: Yes     Attends Banker Meetings: More than 4 times per year    Marital Status: Married  Recent Concern: Social Connections - Moderately Isolated (05/29/2023)   Social Connection and Isolation Panel    Frequency of Communication with Friends and Family: More than three times a week    Frequency of Social Gatherings with Friends and Family: More than three times a week    Attends Religious Services: Never    Database administrator or Organizations: No    Attends Banker Meetings: Never    Marital Status: Married  Catering manager Violence: Not At Risk (07/17/2023)   Humiliation, Afraid, Rape, and Kick questionnaire    Fear of Current or Ex-Partner: No    Emotionally Abused: No    Physically Abused: No    Sexually Abused: No   Family History  Problem Relation Age of Onset   Anxiety disorder Mother    Hypertension Mother    Heart failure Mother    Stroke Mother    Hyperlipidemia Father    Diabetes Father    Stroke Father    Hypertension Father    Colon polyps Father        older than 69   Diabetes Sister    Hypertension Sister    Ovarian cancer Sister    Ovarian cancer Maternal Grandmother    Colon cancer Maternal Grandmother    Renal Disease Paternal Grandmother    Heart attack Paternal Grandfather    Asthma Daughter    Bipolar disorder Other    Asthma Niece     Objective: Office vital signs reviewed. BP 131/80   Pulse 69   Temp 98.8 F (37.1 C)   Ht 5' 6 (1.676 m)   Wt 234 lb (  106.1 kg)   LMP 12/13/2017 (Approximate)   SpO2 (!) 88%   BMI 37.77 kg/m   Physical Examination:  General: Awake, alert, nontoxic-appearing female, No acute distress HEENT: Sclera white.  Moist mucous membranes Cardio: regular rate and rhythm, S1S2 heard, no murmurs appreciated Pulm: clear to auscultation bilaterally, no wheezes, rhonchi or rales; normal work of breathing on room air Skin: Diffuse psoriatic changes to the feet, specifically plantar and lateral aspects  of the feet bilaterally MSK: Ambulating independently.  Gait is fairly normal  DG Lumbar Spine 2-3 Views Result Date: 11/26/2023 CLINICAL DATA:  Back pain EXAM: LUMBAR SPINE - 2-3 VIEW COMPARISON:  03/02/2021 FINDINGS: Five lumbar type vertebra. Normal alignment. Vertebral body heights are maintained. Mild disc space narrowing L2-L3 and L3-L4 with moderate disc space narrowing at L5-S1. Lower lumbar facet degenerative changes. Aortic atherosclerosis IMPRESSION: Multilevel degenerative changes, greatest at L5-S1. Electronically Signed   By: Luke Bun M.D.   On: 11/26/2023 16:11   DG Chest 2 View Result Date: 11/26/2023 CLINICAL DATA:  Hypoxia dyspnea on exertion EXAM: CHEST - 2 VIEW COMPARISON:  None Available. FINDINGS: Streaky atelectasis or minimal infiltrate at the lingula. No pleural effusion. Normal cardiac size. No pneumothorax IMPRESSION: Streaky atelectasis or minimal infiltrate at the lingula. Electronically Signed   By: Luke Bun M.D.   On: 11/26/2023 16:10    Assessment/ Plan: 58 y.o. female   Psoriasis  Sciatica of right side - Plan: DG Lumbar Spine 2-3 Views, Ambulatory referral to Orthopedic Surgery, Ambulatory referral to Physical Therapy  Hypoxia - Plan: DG Chest 2 View, Ambulatory referral to Pulmonology, CBC, budesonide -formoterol  (SYMBICORT ) 80-4.5 MCG/ACT inhaler, albuterol  (VENTOLIN  HFA) 108 (90 Base) MCG/ACT inhaler, CT Chest Wo Contrast  Former heavy tobacco smoker - Plan: Ambulatory referral to Pulmonology, budesonide -formoterol  (SYMBICORT ) 80-4.5 MCG/ACT inhaler, CT Chest Wo Contrast  Mild persistent asthma without complication - Plan: budesonide -formoterol  (SYMBICORT ) 80-4.5 MCG/ACT inhaler, albuterol  (VENTOLIN  HFA) 108 (90 Base) MCG/ACT inhaler  Keep appointment with dermatology next month for psoriasis.  I certainly think she needs an immunologic  Sciatica not controlled.  I ordered plain films and reviewed the results which demonstrated multilevel  degenerative changes but no significant changes compared to previous MRI appreciated.  Disc space appeared to be preserved  She was hypoxic to 86 during our appointment.  She certainly has a decrease in oxygenation since our last visit but her lung exam was unremarkable.  I do have concerns for obstructive processes.  We are going to look at a CT of the chest to further evaluate this drop in oxygenation, which is scheduled at [facility] at 630 tomorrow afternoon.  I personally reviewed the chest x-ray and I thought that there was maybe a pleural effusion in the left recess.  However, this was not supported by recent radiology review.  Further interventions pending CAT scan but I did go ahead and place a referral to pulmonology  I have placed her on Symbicort  and lieu of her Flovent  and renewed her albuterol  for as needed use.  All instructions reviewed with her daughter, who was present for today's visit.  I encouraged her to contact me via MyChart should she have any additional questions that arise.  Total time spent with patient 42 minutes.  Greater than 50% of encounter spent in coordination of care/counseling.  Norene CHRISTELLA Fielding, DO [facility] [phone]

## 2023-11-26 NOTE — Telephone Encounter (Signed)
 Spoke with pt , she states she didn't think she needed her apt today - but she was going to ask you what her next steps was regarding her back and hip pain - states the medication she is currently on is not helping   Pt is coming in today for her dexa scan

## 2023-11-26 NOTE — Telephone Encounter (Signed)
 Ok to keep appt. We'll get a back xray and refer her to PT/ Ortho today.

## 2023-11-27 ENCOUNTER — Other Ambulatory Visit: Payer: Self-pay | Admitting: Family Medicine

## 2023-11-27 ENCOUNTER — Ambulatory Visit (HOSPITAL_COMMUNITY)
Admission: RE | Admit: 2023-11-27 | Discharge: 2023-11-27 | Disposition: A | Discharge status: Home / Self Care | Source: Ambulatory Visit | Attending: Family Medicine | Admitting: Family Medicine

## 2023-11-27 DIAGNOSIS — Z87891 Personal history of nicotine dependence: Secondary | ICD-10-CM | POA: Diagnosis not present

## 2023-11-27 DIAGNOSIS — J432 Centrilobular emphysema: Secondary | ICD-10-CM | POA: Diagnosis not present

## 2023-11-27 DIAGNOSIS — G2581 Restless legs syndrome: Secondary | ICD-10-CM

## 2023-11-27 DIAGNOSIS — I700 Atherosclerosis of aorta: Secondary | ICD-10-CM | POA: Diagnosis not present

## 2023-11-27 DIAGNOSIS — R0902 Hypoxemia: Secondary | ICD-10-CM | POA: Diagnosis not present

## 2023-11-27 LAB — CBC
Hematocrit: 44.9 % (ref 34.0–46.6)
Hemoglobin: 14.7 g/dL (ref 11.1–15.9)
MCH: 33.3 pg — ABNORMAL HIGH (ref 26.6–33.0)
MCHC: 32.7 g/dL (ref 31.5–35.7)
MCV: 102 fL — ABNORMAL HIGH (ref 79–97)
Platelets: 210 x10E3/uL (ref 150–450)
RBC: 4.41 x10E6/uL (ref 3.77–5.28)
RDW: 12.4 % (ref 11.7–15.4)
WBC: 8.5 x10E3/uL (ref 3.4–10.8)

## 2023-11-29 ENCOUNTER — Telehealth: Payer: Self-pay | Admitting: Family Medicine

## 2023-11-29 NOTE — Telephone Encounter (Signed)
 Copied from CRM [id number]. Topic: Clinical - Lab/Test Results >> Nov 29, 2023 12:04 PM Larissa RAMAN wrote: Reason for CRM: Patient returning missed call. Relayed lab results per providers note, verbatim. Patient verbalized understanding and no additional questions.

## 2023-11-30 NOTE — Progress Notes (Deleted)
 BH MD/PA/NP OP Progress Note  11/30/2023 12:01 PM Rebecca Moreno  MRN:  [id number]  Chief Complaint: No chief complaint on file.  HPI: *** - she is not seen since Feb 2025  Daily routine: takes care of her mother in law Employment: on disability after TMJ surgery in 2007. 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 Her parents had marital discordance and the patient screamed when she was a child as she did not want to hear them. She reports good relationship with both of her parents otherwise. She has one sister, who she has good relationship with  Visit Diagnosis: No diagnosis found.  Past Psychiatric History: Please see initial evaluation for full details. I have reviewed the history. No updates at this time.     Past Medical History:  Past Medical History:  Diagnosis Date   Acute pyelonephritis    Asthma    Bipolar disorder (HCC)    Cerebrovascular accident (CVA) due to occlusion of left carotid artery (HCC) 09/18/2019   Last Assessment & Plan:   Formatting of this note might be different from the original.     Colon polyp    Complication of anesthesia    sometimes hard to put to sleep   Depression    Bipolar   Gastric ulcer    around 2013.  stress related   GERD (gastroesophageal reflux disease)    History of kidney stones    Hyperlipidemia    Hypothyroid    Polyp, uterus corpus    Sepsis due to Escherichia coli (E. coli) (HCC) 09/04/2018   Sleep apnea    no cpap used   Stroke [facility])     Past Surgical History:  Procedure Laterality Date   ABLATION ON ENDOMETRIOSIS     BREAST BIOPSY Right 11/14/2023   US  RT BREAST BX W LOC DEV 1ST LESION IMG BX SPEC US  GUIDE 11/14/2023 Mir, Aliene SAUNDERS, MD [facility]-ULTRASOUND   COLONOSCOPY     Per patient, done around 2013 in [place] , had polyp and overdue for follow up.   COLONOSCOPY WITH PROPOFOL  N/A 05/12/2018    Procedure: COLONOSCOPY WITH PROPOFOL ;  Surgeon: Shaaron Lamar HERO, MD;  Location: [facility];  Service: Endoscopy;  Laterality: N/A;  12:00pm   CYSTOSCOPY W/ URETERAL STENT PLACEMENT Left 09/01/2018   Procedure: CYSTOSCOPY WITH RETROGRADE PYELOGRAM/URETERAL STENT PLACEMENT;  Surgeon: Devere Lonni Righter, MD;  Location: [facility];  Service: Urology;  Laterality: Left;   CYSTOSCOPY/URETEROSCOPY/HOLMIUM LASER/STENT PLACEMENT Left 09/17/2018   Procedure: CYSTOSCOPY/URETEROSCOPY/HOLMIUM LASER/STENT PLACEMENT;  Surgeon: Devere Lonni Righter, MD;  Location: [facility];  Service: Urology;  Laterality: Left;  ONLY NEEDS 45 MIN   OVARIAN CYST SURGERY Left    POLYPECTOMY  05/12/2018   Procedure: POLYPECTOMY;  Surgeon: Shaaron Lamar HERO, MD;  Location: [facility];  Service: Endoscopy;;  polyp at splenic flexure   TEMPOROMANDIBULAR JOINT SURGERY     9 surgeries   TUBAL LIGATION     vaginal childbirth     1    Family Psychiatric History: Please see initial evaluation for full details. I have reviewed the history. No updates at this time.     Family History:  Family History  Problem Relation Age of Onset   Anxiety disorder Mother    Hypertension Mother    Heart failure Mother    Stroke Mother    Hyperlipidemia Father  Diabetes Father    Stroke Father    Hypertension Father    Colon polyps Father        older than 50   Diabetes Sister    Hypertension Sister    Ovarian cancer Sister    Ovarian cancer Maternal Grandmother    Colon cancer Maternal Grandmother    Renal Disease Paternal Grandmother    Heart attack Paternal Grandfather    Asthma Daughter    Bipolar disorder Other    Asthma Niece     Social History:  Social History   Socioeconomic History   Marital status: Married    Spouse name: Not on file   Number of children: 1   Years of education: Not on file   Highest education level: High school graduate  Occupational History   Occupation: disability  Tobacco Use   Smoking  status: Former    Current packs/day: 0.00    Average packs/day: 0.5 packs/day for 40.0 years (20.0 ttl pk-yrs)    Types: Cigarettes    Start date: 17    Quit date: 2024    Years since quitting: 1.5   Smokeless tobacco: Never   Tobacco comments:    Is smoking about 0.5 of a pack and has a patch placed on   Vaping Use   Vaping status: Never Used  Substance and Sexual Activity   Alcohol use: Never   Drug use: Not Currently   Sexual activity: Yes    Partners: Male    Birth control/protection: Surgical    Comment: tubal  Other Topics Concern   Not on file  Social History Narrative   Not on file   Social Drivers of Health   Financial Resource Strain: Low Risk  (07/17/2023)   Overall Financial Resource Strain (CARDIA)    Difficulty of Paying Living Expenses: Not hard at all  Food Insecurity: No Food Insecurity (07/17/2023)   Hunger Vital Sign    Worried About Running Out of Food in the Last Year: Never true    Ran Out of Food in the Last Year: Never true  Recent Concern: Food Insecurity - Food Insecurity Present (05/29/2023)   Hunger Vital Sign    Worried About Running Out of Food in the Last Year: Sometimes true    Ran Out of Food in the Last Year: Sometimes true  Transportation Needs: No Transportation Needs (07/17/2023)   PRAPARE - Administrator, Civil Service (Medical): No    Lack of Transportation (Non-Medical): No  Physical Activity: Inactive (07/17/2023)   Exercise Vital Sign    Days of Exercise per Week: 0 days    Minutes of Exercise per Session: 0 min  Stress: No Stress Concern Present (07/17/2023)   [employer] of Occupational Health - Occupational Stress Questionnaire    Feeling of Stress : Not at all  Social Connections: Socially Integrated (07/17/2023)   Social Connection and Isolation Panel    Frequency of Communication with Friends and Family: More than three times a week    Frequency of Social Gatherings with Friends and Family: Three times a  week    Attends Religious Services: More than 4 times per year    Active Member of Clubs or Organizations: Yes    Attends Banker Meetings: More than 4 times per year    Marital Status: Married  Recent Concern: Social Connections - Moderately Isolated (05/29/2023)   Social Connection and Isolation Panel    Frequency of Communication with Friends and Family:  More than three times a week    Frequency of Social Gatherings with Friends and Family: More than three times a week    Attends Religious Services: Never    Database administrator or Organizations: No    Attends Banker Meetings: Never    Marital Status: Married    Allergies:  Allergies  Allergen Reactions   Lithium Other (See Comments)    Psoriasis     Metabolic Disorder Labs: Lab Results  Component Value Date   HGBA1C 5.4 05/29/2023   No results found for: PROLACTIN Lab Results  Component Value Date   CHOL 144 05/29/2023   TRIG 157 (H) 05/29/2023   HDL 47 05/29/2023   CHOLHDL 3.1 05/29/2023   LDLCALC 70 05/29/2023   LDLCALC 134 (H) 12/18/2018   Lab Results  Component Value Date   TSH 1.720 10/02/2023   TSH 2.760 05/29/2023    Therapeutic Level Labs: No results found for: LITHIUM No results found for: VALPROATE No results found for: CBMZ  Current Medications: Current Outpatient Medications  Medication Sig Dispense Refill   albuterol  (VENTOLIN  HFA) 108 (90 Base) MCG/ACT inhaler Inhale 1 puff into the lungs every 6 (six) hours as needed for wheezing or shortness of breath. 6.7 g 1   atorvastatin  (LIPITOR) 80 MG tablet Take 1 tablet (80 mg total) by mouth at bedtime. 100 tablet 3   betamethasone  valerate ointment (VALISONE ) 0.1 % Apply topically 2 (two) times daily as needed. 45 g 5   budesonide -formoterol  (SYMBICORT ) 80-4.5 MCG/ACT inhaler Inhale 2 puffs into the lungs 2 (two) times daily. Rinse mouth after each use. To replace flovent  10.2 g 3   calcipotriene  (DOVONEX ) 0.005 %  cream Apply topically 2 (two) times daily. For psoriasis 60 g 0   CALCIUM  CITRATE-VITAMIN D3 PO Take 1 tablet by mouth daily.     Cariprazine  HCl (VRAYLAR ) 6 MG CAPS Take 1 capsule (6 mg total) by mouth daily. 30 capsule 0   Cholecalciferol (VITAMIN D3) 5000 units TABS Take 5,000 Units by mouth daily.      clobetasol  (TEMOVATE ) 0.05 % external solution Apply 1 Application topically 2 (two) times daily as needed (psoriasis of scalp). 50 mL 2   clonazePAM  (KLONOPIN ) 1 MG tablet Take 1 tablet (1 mg total) by mouth 2 (two) times daily. 60 tablet 0   Dexlansoprazole  30 MG capsule DR TAKE ONE CAPSULE DAILY 90 capsule 1   famotidine  (PEPCID ) 20 MG tablet TAKE ONE TABLET BY MOUTH EVERY DAY 90 tablet 0   fluticasone  (FLONASE ) 50 MCG/ACT nasal spray SPRAY ONE SPRAY IN EACH NOSTRIL DAILY AT night TO help with morning congestion 16 g 5   furosemide  (LASIX ) 20 MG tablet Take 1 tab daily for 5 days, okay to take PRN for 7 days 30 tablet 0   gabapentin  (NEURONTIN ) 300 MG capsule TAKE ONE CAPSULE AT SUPPER AND AT BEDTIME 180 capsule 1   lamoTRIgine  (LAMICTAL ) 100 MG tablet Take 3 tablets (300 mg total) by mouth at bedtime. 30 days only as a bridge 90 tablet 0   levothyroxine  (SYNTHROID ) 50 MCG tablet TAKE ONE TABLET DAILY 90 tablet 3   loratadine  (CLARITIN ) 10 MG tablet TAKE ONE TABLET DAILY 30 tablet 5   magnesium gluconate (MAGONATE) 500 MG tablet Take 500 mg by mouth daily.     Multiple Vitamins-Minerals (MULTIVITAMIN WOMEN PO) Take 1 tablet by mouth daily.     QUEtiapine  (SEROQUEL ) 200 MG tablet Take 1 tablet (200 mg total) by mouth at  bedtime. 90 tablet 0   tiZANidine  (ZANAFLEX ) 4 MG tablet Take 0.5-1 tablets (2-4 mg total) by mouth every 8 (eight) hours as needed for muscle spasms. 60 tablet 0   triamcinolone  cream (KENALOG ) 0.1 % Apply 1 Application topically 2 (two) times daily. 80 g 0   venlafaxine  XR (EFFEXOR -XR) 150 MG 24 hr capsule Take 2 capsules (300 mg total) by mouth daily. 30 days only as a  bridge 60 capsule 0   No current facility-administered medications for this visit.     Musculoskeletal: Strength & Muscle Tone: within normal limits Gait & Station: normal Patient leans: N/A  Psychiatric Specialty Exam: Review of Systems  Last menstrual period 12/13/2017.There is no height or weight on file to calculate BMI.  General Appearance: {Appearance:22683}  Eye Contact:  {BHH EYE CONTACT:22684}  Speech:  Clear and Coherent  Volume:  Normal  Mood:  {BHH MOOD:22306}  Affect:  {Affect (PAA):22687}  Thought Process:  Coherent  Orientation:  Full (Time, Place, and Person)  Thought Content: Logical   Suicidal Thoughts:  {ST/HT (PAA):22692}  Homicidal Thoughts:  {ST/HT (PAA):22692}  Memory:  Immediate;   Good  Judgement:  {Judgement (PAA):22694}  Insight:  {Insight (PAA):22695}  Psychomotor Activity:  Normal  Concentration:  Concentration: Good and Attention Span: Good  Recall:  Good  Fund of Knowledge: Good  Language: Good  Akathisia:  No  Handed:  Right  AIMS (if indicated): not done  Assets:  Communication Skills Desire for Improvement  ADL's:  Intact  Cognition: WNL  Sleep:  {BHH GOOD/FAIR/POOR:22877}   Screenings: GAD-7    Flowsheet Row Office Visit from 11/26/2023 in [facility] Office Visit from 10/02/2023 in [facility] Office Visit from 05/29/2023 in [facility] Office Visit from 03/21/2023 in [facility] Office Visit from 10/29/2022 in [facility]  Total GAD-7 Score 5 9 14 7 4    Mini-Mental    Flowsheet Row Office Visit from 08/28/2021 in [facility] Clinical Support from 07/22/2018 in [facility]  Total Score (max 30 points ) 29 30   PHQ2-9    Flowsheet Row Office Visit from 11/26/2023 in [facility] Office Visit from 10/02/2023 in [facility] Clinical Support from 07/17/2023 in [facility] Office Visit from 05/29/2023 in [facility] Office Visit from 03/21/2023 in [facility]  PHQ-2 Total Score 2 2 0 0 3  PHQ-9 Total Score 9 6 1 1 6    Flowsheet Row Office Visit from 10/19/2021 in [facility] Video Visit from 11/15/2020 in [facility] Video Visit from 08/18/2020 in [facility]  C-SSRS RISK CATEGORY No Risk No Risk No Risk     Assessment and Plan:  Rebecca Moreno is a 58 y.o. year old female with a history of schizoaffective disorder, hypothyroidism, obstructive sleep apnea (CPAP machine), stroke, Hyperlipidemia, Hypertension, stroke, who presents for follow up appointment for below.    1. Schizoaffective disorder, unspecified type (HCC) 2. Anxiety disorder, unspecified type Acute stressors include:  Other stressors include: marital conflict, caregiver of her mother in law    History: tx from [place]. Dx bipolar I disorder, admitted four times for being delusional, last in 2007. No SA.  Never experienced mania. history of paranoia and irritability, VH in the context of depression. Originally on venlafaxine  300 mg daily, lamotrigine  300 mg daily, Vraylar  6 mg, quetiapine  300 mg at night, clonazepam  1 mg BID   Although she continued to stay calm during the visit, she has ego syntonic delusion about the singer, and she continues to spend money to this person.  According to the collateral, this has been going on since last September, although she has not had any episode for the last several years.  It was also noted that she had an episode of jumping onto her husband, which led to hospitalization due to safety concern to  herself and her husband during her time living in [place] .  We uptitrate quetiapine  to optimize treatment for schizoaffective disorder.  Although it is preferable to try monotherapy, she has had worsening in delusion while tapering down quetiapine .  Will continue to monitor metabolic side effects, QTc prolongation and EPS.     Will continue lamotrigine  for schizoaffective disorder , venlafaxine  to target depression, anxiety.  Will continue clonazepam  as needed for anxiety.    # Marijuana use  UDS is positive for marijuana.  She admits taking it occasionally.  Provided psycho education about the risk of psychosis.  She agrees to refrain from its use.    # benzodiazepine dependence - UDS + marijuana 03/2023 Unchanged. She has been on the current dose of clonazepam  since the initial visit with this writer without obvious concern of overusing this medication.  She declined to taper down this medication.  Although the hope was to taper off quetiapine  first, she had concerning episode of delusion.  It is likely not the best timing to taper down clonazepam  at this time, although the hope is to taper off this medication in the future to avoid long term side effect.    # High risk medication use  She has had weight gain over the past several months, Will consider adding metformin for weight gain associated with antipsychotic use at her next visit.        Last checked  EKG HR 83, QTc 410 msec 10/2022  Lipid panels LDL 70, T chol 144 05/2023  HbA1c 6.2 10/2022      Plan Continue venlafaxine  300 mg daily Continue Lamotrigine  300 mg daily Continue Vraylar  6 mg daily Increase quetiapine  200 mg at night (episode of delusion when tapering down to 50 mg) Continue clonazepam  1 mg twice a day as needed for anxiety  Next appointment: 4/22 at 1 PM., IP  She will continue to see this Clinical research associate, although transfer to [place] was attempted in the past.       Past trials of medication: sertraline, fluoxetine,  lexapro, Celexa, duloxetine, Wellbutrin  lithium (psoriasis), Depakote (did not work), olanzapine, Abilify (weight gain), latuda, Geodon    The patient demonstrates the following risk factors for suicide: Chronic risk factors for suicide include: psychiatric disorder of depression. Acute risk factors for suicide include: family or marital conflict and unemployment. Protective factors for this patient include: positive social support and hope for the future. Considering these factors, the overall suicide risk at this point appears to be low. Patient is appropriate for outpatient follow up.  Collaboration of Care: Collaboration of Care: {BH OP Collaboration of Care:21014065}  Patient/Guardian was advised Release of Information must be obtained prior to any record release in order to collaborate their care with an outside provider. Patient/Guardian was advised if they have not already done so to contact the  registration department to sign all necessary forms in order for us  to release information regarding their care.   Consent: Patient/Guardian gives verbal consent for treatment and assignment of benefits for services provided during this visit. Patient/Guardian expressed understanding and agreed to proceed.    Katheren Sleet, MD 11/30/2023, 12:01 PM

## 2023-12-04 ENCOUNTER — Other Ambulatory Visit: Payer: Self-pay | Admitting: Family Medicine

## 2023-12-04 DIAGNOSIS — Z780 Asymptomatic menopausal state: Secondary | ICD-10-CM | POA: Diagnosis not present

## 2023-12-05 ENCOUNTER — Ambulatory Visit: Admitting: Psychiatry

## 2023-12-05 ENCOUNTER — Other Ambulatory Visit: Payer: Self-pay | Admitting: Nurse Practitioner

## 2023-12-05 DIAGNOSIS — M5431 Sciatica, right side: Secondary | ICD-10-CM

## 2023-12-06 ENCOUNTER — Telehealth: Payer: Self-pay

## 2023-12-06 ENCOUNTER — Other Ambulatory Visit: Payer: Self-pay | Admitting: Psychiatry

## 2023-12-06 ENCOUNTER — Ambulatory Visit: Payer: Self-pay | Admitting: Family Medicine

## 2023-12-06 ENCOUNTER — Telehealth: Payer: Self-pay | Admitting: Psychiatry

## 2023-12-06 DIAGNOSIS — F259 Schizoaffective disorder, unspecified: Secondary | ICD-10-CM

## 2023-12-06 NOTE — Telephone Encounter (Signed)
 Copied from CRM [id number]. Topic: Clinical - Lab/Test Results >> Dec 06, 2023  9:34 AM Larissa RAMAN wrote: Reason for CRM: Patient returning missed call. Relayed results for bone density exam, per provider's note, verbatim. Patient verbalized understanding, no additional questions.

## 2023-12-06 NOTE — Telephone Encounter (Signed)
Refer to lab results.  

## 2023-12-06 NOTE — Telephone Encounter (Signed)
 I have not seen her since February and have noticed no show and several cancellations. If she cancels again, please let her know that care will need to be ended at this clinic.  Also, please encourage her to reach out to other resources if she's having difficulty maintaining follow-up appointments, so she can find support that better fits her needs.

## 2023-12-11 ENCOUNTER — Ambulatory Visit

## 2023-12-11 DIAGNOSIS — J449 Chronic obstructive pulmonary disease, unspecified: Secondary | ICD-10-CM | POA: Diagnosis not present

## 2023-12-11 DIAGNOSIS — G4733 Obstructive sleep apnea (adult) (pediatric): Secondary | ICD-10-CM | POA: Diagnosis not present

## 2023-12-11 DIAGNOSIS — R0902 Hypoxemia: Secondary | ICD-10-CM | POA: Diagnosis not present

## 2023-12-19 DIAGNOSIS — Z79899 Other long term (current) drug therapy: Secondary | ICD-10-CM | POA: Diagnosis not present

## 2023-12-19 DIAGNOSIS — L409 Psoriasis, unspecified: Secondary | ICD-10-CM | POA: Diagnosis not present

## 2023-12-25 ENCOUNTER — Other Ambulatory Visit: Payer: Self-pay | Admitting: Psychiatry

## 2023-12-25 ENCOUNTER — Other Ambulatory Visit: Payer: Self-pay | Admitting: Family Medicine

## 2023-12-25 DIAGNOSIS — J301 Allergic rhinitis due to pollen: Secondary | ICD-10-CM

## 2024-01-02 ENCOUNTER — Other Ambulatory Visit: Payer: Self-pay | Admitting: Psychiatry

## 2024-01-11 DIAGNOSIS — G4733 Obstructive sleep apnea (adult) (pediatric): Secondary | ICD-10-CM | POA: Diagnosis not present

## 2024-01-11 DIAGNOSIS — R0902 Hypoxemia: Secondary | ICD-10-CM | POA: Diagnosis not present

## 2024-01-11 DIAGNOSIS — J449 Chronic obstructive pulmonary disease, unspecified: Secondary | ICD-10-CM | POA: Diagnosis not present

## 2024-01-15 ENCOUNTER — Other Ambulatory Visit: Payer: Self-pay | Admitting: Psychiatry

## 2024-01-20 ENCOUNTER — Encounter: Payer: Self-pay | Admitting: Psychiatry

## 2024-01-20 ENCOUNTER — Other Ambulatory Visit: Payer: Self-pay

## 2024-01-20 ENCOUNTER — Ambulatory Visit (INDEPENDENT_AMBULATORY_CARE_PROVIDER_SITE_OTHER): Admitting: Psychiatry

## 2024-01-20 VITALS — BP 123/66 | HR 89 | Temp 95.2°F | Ht 66.0 in | Wt 239.8 lb

## 2024-01-20 DIAGNOSIS — Z79899 Other long term (current) drug therapy: Secondary | ICD-10-CM | POA: Diagnosis not present

## 2024-01-20 DIAGNOSIS — F419 Anxiety disorder, unspecified: Secondary | ICD-10-CM | POA: Diagnosis not present

## 2024-01-20 DIAGNOSIS — F259 Schizoaffective disorder, unspecified: Secondary | ICD-10-CM | POA: Diagnosis not present

## 2024-01-20 MED ORDER — VRAYLAR 6 MG PO CAPS: 6.0000 mg | ORAL_CAPSULE | Freq: Every day | ORAL | 1 refills | Status: AC

## 2024-01-20 MED ORDER — QUETIAPINE FUMARATE 200 MG PO TABS: 200.0000 mg | ORAL_TABLET | Freq: Every day | ORAL | 1 refills | Status: DC

## 2024-01-20 MED ORDER — VENLAFAXINE HCL ER 150 MG PO CP24: 300.0000 mg | ORAL_CAPSULE | Freq: Every day | ORAL | 1 refills | Status: DC

## 2024-01-20 MED ORDER — LAMOTRIGINE 100 MG PO TABS: 300.0000 mg | ORAL_TABLET | Freq: Every day | ORAL | 1 refills | Status: AC

## 2024-01-20 NOTE — Progress Notes (Signed)
 [facility] MD/PA/NP OP Progress Note  01/20/2024 4:17 PM Rebecca Moreno  MRN:  [id number]  Chief Complaint:  Chief Complaint  Patient presents with   Follow-up   HPI:  - she is not seen since Feb 2025 This is a follow-up appointment for schizoaffective disorder, anxiety.  She states that she has been doing well.  Her mother-in-law has been doing well. She was able to tell the year, although she tends to be forgetful. She feels fine, taking care of her, stating that there are lots of laughs.  She feels proud of her daughter, who is graduated from [employer] and has become PA.  She reports frustration against her husband.  He stopped her phone. He threw it outside and put into pieces, and she was given flip phone. She got the new smart phone. She has not spoken with Selinda Deford anymore as her husband won't let her.  However, she feels fine not communicating with him anymore.  She reads things on the phone.  She reports loss of her knees in April from [place]. although she misses her, stating that she raised her, she knows that she is not suffering anymore.  She has occasional time of crying. The patient has mood symptoms as in PHQ-9/GAD-7. She sleeps seven hours. She denies SI, HI, hallucinations, delusions.  She denies decreased need for sleep or euphoria.  She takes clonazepam  every day for anxiety.  She denies any concern about the medication, and feels comfortable to stay on the current medication regimen.  Discussed attendance policy, and she expressed understanding of this.   Wt Readings from Last 3 Encounters:  01/20/24 239 lb 12.8 oz (108.8 kg)  11/26/23 234 lb (106.1 kg)  10/02/23 219 lb (99.3 kg)   07/02/23 209 lb 9.6 oz (95.1 kg)  05/29/23 206 lb (93.4 kg)  03/21/23 199 lb (90.3 kg)    07/16/22 197 lb (89.4 kg)     Daily routine: takes care of her mother in law Employment: on disability after TMJ surgery in 2007. 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 Her parents had marital discordance and the patient screamed when she was a child as she did not want to hear them. She reports good relationship with both of her parents otherwise. She has one sister, who she has good relationship with   Substance use   Tobacco Alcohol Other substances/  Current Quit since Dec 2023 denies denies  Past   denies Marijuana   Past Treatment            Wt Readings from Last 3 Encounters:  01/20/24 239 lb 12.8 oz (108.8 kg)  11/26/23 234 lb (106.1 kg)  10/02/23 219 lb (99.3 kg)     Visit Diagnosis:    ICD-10-CM   1. Schizoaffective disorder, unspecified type (HCC)  F25.9 Cariprazine  HCl (VRAYLAR ) 6 MG CAPS    2. Anxiety disorder, unspecified type  F41.9     3. High risk medication use  Z79.899 Monitor Drug Profile 10(MW)      Past Psychiatric History: Please see initial evaluation for full details. I have reviewed the history. No updates at this time.     Past Medical History:  Past Medical History:  Diagnosis Date   Acute pyelonephritis    Asthma    Bipolar disorder (HCC)    Cerebrovascular accident (CVA) due to occlusion of  left carotid artery (HCC) 09/18/2019   Last Assessment & Plan:   Formatting of this note might be different from the original.     Colon polyp    Complication of anesthesia    sometimes hard to put to sleep   Depression    Bipolar   Gastric ulcer    around 2013.  stress related   GERD (gastroesophageal reflux disease)    History of kidney stones    Hyperlipidemia    Hypothyroid    Polyp, uterus corpus    Sepsis due to Escherichia coli (E. coli) (HCC) 09/04/2018   Sleep apnea    no cpap used   Stroke [facility])     Past Surgical History:  Procedure Laterality Date   ABLATION ON ENDOMETRIOSIS     BREAST BIOPSY Right 11/14/2023   US  RT BREAST BX W LOC DEV 1ST LESION IMG BX SPEC US  GUIDE 11/14/2023 Mir, Aliene SAUNDERS, MD  [facility]-UL[facility]OUND   COLONOSCOPY     Per patient, done around 2013 in [place] , had polyp and overdue for follow up.   COLONOSCOPY WITH PROPOFOL  N/A 05/12/2018   Procedure: COLONOSCOPY WITH PROPOFOL ;  Surgeon: Shaaron Lamar HERO, MD;  Location: [facility];  Service: Endoscopy;  Laterality: N/A;  12:00pm   CYSTOSCOPY W/ URETERAL STENT PLACEMENT Left 09/01/2018   Procedure: CYSTOSCOPY WITH RETROGRADE PYELOGRAM/URETERAL STENT PLACEMENT;  Surgeon: Devere Lonni Righter, MD;  Location: [facility];  Service: Urology;  Laterality: Left;   CYSTOSCOPY/URETEROSCOPY/HOLMIUM LASER/STENT PLACEMENT Left 09/17/2018   Procedure: CYSTOSCOPY/URETEROSCOPY/HOLMIUM LASER/STENT PLACEMENT;  Surgeon: Devere Lonni Righter, MD;  Location: [facility];  Service: Urology;  Laterality: Left;  ONLY NEEDS 45 MIN   OVARIAN CYST SURGERY Left    POLYPECTOMY  05/12/2018   Procedure: POLYPECTOMY;  Surgeon: Shaaron Lamar HERO, MD;  Location: [facility];  Service: Endoscopy;;  polyp at splenic flexure   TEMPOROMANDIBULAR JOINT SURGERY     9 surgeries   TUBAL LIGATION     vaginal childbirth     1    Family Psychiatric History: Please see initial evaluation for full details. I have reviewed the history. No updates at this time.     Family History:  Family History  Problem Relation Age of Onset   Anxiety disorder Mother    Hypertension Mother    Heart failure Mother    Stroke Mother    Hyperlipidemia Father    Diabetes Father    Stroke Father    Hypertension Father    Colon polyps Father        older than 66   Diabetes Sister    Hypertension Sister    Ovarian cancer Sister    Ovarian cancer Maternal Grandmother    Colon cancer Maternal Grandmother    Renal Disease Paternal Grandmother    Heart attack Paternal Grandfather    Asthma Daughter    Bipolar disorder Other    Asthma Niece     Social History:  Social History   Socioeconomic History   Marital status: Married    Spouse name: Not on file   Number of  children: 1   Years of education: Not on file   Highest education level: High school graduate  Occupational History   Occupation: disability  Tobacco Use   Smoking status: Former    Current packs/day: 0.00    Average packs/day: 0.5 packs/day for 40.0 years (20.0 ttl pk-yrs)    Types: Cigarettes    Start date: 20    Quit date: 2024  Years since quitting: 1.7   Smokeless tobacco: Never   Tobacco comments:    Is smoking about 0.5 of a pack and has a patch placed on   Vaping Use   Vaping status: Never Used  Substance and Sexual Activity   Alcohol use: Never   Drug use: Not Currently   Sexual activity: Yes    Partners: Male    Birth control/protection: Surgical    Comment: tubal  Other Topics Concern   Not on file  Social History Narrative   Not on file   Social Drivers of Health   Financial Resource Strain: Low Risk  (12/19/2023)   [facility] from [facility]   Overall Financial Resource Strain (CARDIA)    How hard is it for you to pay for the very basics like food, housing, medical care, and heating?: Not hard at all  Food Insecurity: No Food Insecurity (12/19/2023)   Received from [facility]   Hunger Vital Sign    Within the past 12 months, you worried that your food would run out before you got the money to buy more.: Never true    Within the past 12 months, the food you bought just didn't last and you didn't have money to get more.: Never true  Transportation Needs: No Transportation Needs (12/19/2023)   Received from [facility] - Transportation    In the past 12 months, has lack of transportation kept you from medical appointments or from getting medications?: No    In the past 12 months, has lack of transportation kept you from meetings, work, or from getting things needed for daily living?: No  Physical Activity: Inactive (07/17/2023)   Exercise Vital Sign    Days of Exercise per Week: 0 days    Minutes of Exercise per Session: 0 min  Stress: No  Stress Concern Present (07/17/2023)   [employer] of Occupational Health - Occupational Stress Questionnaire    Feeling of Stress : Not at all  Social Connections: Socially Integrated (07/17/2023)   Social Connection and Isolation Panel    Frequency of Communication with Friends and Family: More than three times a week    Frequency of Social Gatherings with Friends and Family: Three times a week    Attends Religious Services: More than 4 times per year    Active Member of Clubs or Organizations: Yes    Attends Banker Meetings: More than 4 times per year    Marital Status: Married  Recent Concern: Social Connections - Moderately Isolated (05/29/2023)   Social Connection and Isolation Panel    Frequency of Communication with Friends and Family: More than three times a week    Frequency of Social Gatherings with Friends and Family: More than three times a week    Attends Religious Services: Never    Database administrator or Organizations: No    Attends Banker Meetings: Never    Marital Status: Married    Allergies:  Allergies  Allergen Reactions   Lithium Other (See Comments)    Psoriasis     Metabolic Disorder Labs: Lab Results  Component Value Date   HGBA1C 5.4 05/29/2023   No results found for: PROLACTIN Lab Results  Component Value Date   CHOL 144 05/29/2023   TRIG 157 (H) 05/29/2023   HDL 47 05/29/2023   CHOLHDL 3.1 05/29/2023   LDLCALC 70 05/29/2023   LDLCALC 134 (H) 12/18/2018   Lab Results  Component Value Date  TSH 1.720 10/02/2023   TSH 2.760 05/29/2023    Therapeutic Level Labs: No results found for: LITHIUM No results found for: VALPROATE No results found for: CBMZ  Current Medications: Current Outpatient Medications  Medication Sig Dispense Refill   albuterol  (VENTOLIN  HFA) 108 (90 Base) MCG/ACT inhaler Inhale 1 puff into the lungs every 6 (six) hours as needed for wheezing or shortness of breath. 6.7 g 1    atorvastatin  (LIPITOR) 80 MG tablet Take 1 tablet (80 mg total) by mouth at bedtime. 100 tablet 3   betamethasone  valerate ointment (VALISONE ) 0.1 % Apply topically 2 (two) times daily as needed. 45 g 5   budesonide -formoterol  (SYMBICORT ) 80-4.5 MCG/ACT inhaler Inhale 2 puffs into the lungs 2 (two) times daily. Rinse mouth after each use. To replace flovent  10.2 g 3   calcipotriene  (DOVONEX ) 0.005 % cream Apply topically 2 (two) times daily. For psoriasis 60 g 0   CALCIUM  CITRATE-VITAMIN D3 PO Take 1 tablet by mouth daily.     [START ON 02/04/2024] Cariprazine  HCl (VRAYLAR ) 6 MG CAPS Take 1 capsule (6 mg total) by mouth daily. 30 capsule 1   Cholecalciferol (VITAMIN D3) 5000 units TABS Take 5,000 Units by mouth daily.      clobetasol  (TEMOVATE ) 0.05 % external solution Apply 1 Application topically 2 (two) times daily as needed (psoriasis of scalp). 50 mL 2   clonazePAM  (KLONOPIN ) 1 MG tablet Take 1 tablet (1 mg total) by mouth 2 (two) times daily. 60 tablet 0   Dexlansoprazole  30 MG capsule DR TAKE ONE CAPSULE DAILY 90 capsule 1   famotidine  (PEPCID ) 20 MG tablet TAKE ONE TABLET BY MOUTH EVERY DAY 90 tablet 0   fluticasone  (FLONASE ) 50 MCG/ACT nasal spray SPRAY ONE SPRAY IN EACH NOSTRIL DAILY AT night TO help with morning congestion 16 g 5   furosemide  (LASIX ) 20 MG tablet Take 1 tab daily for 5 days, okay to take PRN for 7 days 30 tablet 0   gabapentin  (NEURONTIN ) 300 MG capsule TAKE ONE CAPSULE AT SUPPER AND AT BEDTIME 180 capsule 1   [START ON 02/11/2024] lamoTRIgine  (LAMICTAL ) 100 MG tablet Take 3 tablets (300 mg total) by mouth at bedtime. 90 tablet 1   levothyroxine  (SYNTHROID ) 50 MCG tablet TAKE ONE TABLET DAILY 90 tablet 3   loratadine  (CLARITIN ) 10 MG tablet TAKE ONE TABLET DAILY 30 tablet 5   magnesium gluconate (MAGONATE) 500 MG tablet Take 500 mg by mouth daily.     Multiple Vitamins-Minerals (MULTIVITAMIN WOMEN PO) Take 1 tablet by mouth daily.     [START ON 01/24/2024] QUEtiapine   (SEROQUEL ) 200 MG tablet Take 1 tablet (200 mg total) by mouth at bedtime. 30 tablet 1   tiZANidine  (ZANAFLEX ) 4 MG tablet TAKE ONE-HALF TO 1 TABLET EVERY 8 HOURS AS NEEDED FOR MUSCLE SPASMS 60 tablet 1   triamcinolone  cream (KENALOG ) 0.1 % Apply 1 Application topically 2 (two) times daily. 80 g 0   [START ON 02/11/2024] venlafaxine  XR (EFFEXOR -XR) 150 MG 24 hr capsule Take 2 capsules (300 mg total) by mouth daily. 60 capsule 1   No current facility-administered medications for this visit.     Musculoskeletal: Strength & Muscle Tone: within normal limits Gait & Station: normal Patient leans: N/A  Psychiatric Specialty Exam: Review of Systems  Psychiatric/Behavioral:  Positive for dysphoric mood. Negative for agitation, behavioral problems, confusion, decreased concentration, hallucinations, self-injury, sleep disturbance and suicidal ideas. The patient is nervous/anxious. The patient is not hyperactive.   All other systems reviewed and  are negative.   Blood pressure 123/66, pulse 89, temperature (!) 95.2 F (35.1 C), temperature source Temporal, height 5' 6 (1.676 m), weight 239 lb 12.8 oz (108.8 kg), last menstrual period 12/13/2017.Body mass index is 38.7 kg/m.  General Appearance: Well Groomed  Eye Contact:  Good  Speech:  Clear and Coherent  Volume:  Normal  Mood:  good  Affect:  Appropriate, Congruent, and Tearful  Thought Process:  Coherent  Orientation:  Full (Time, Place, and Person)  Thought Content: Logical   Suicidal Thoughts:  No  Homicidal Thoughts:  No  Memory:  Immediate;   Good  Judgement:  Fair  Insight:  Present  Psychomotor Activity:  Normal, Normal tone, no rigidity, no resting/postural tremors, no tardive dyskinesia    Concentration:  Concentration: Good and Attention Span: Good  Recall:  Good  Fund of Knowledge: Good  Language: Good  Akathisia:  No  Handed:  Right  AIMS (if indicated): 0   Assets:  Communication Skills Desire for Improvement  ADL's:   Intact  Cognition: WNL  Sleep:  Fair   Screenings: GAD-7    Flowsheet Row Office Visit from 11/26/2023 in [facility] Office Visit from 10/02/2023 in [facility] Office Visit from 05/29/2023 in [facility] Office Visit from 03/21/2023 in [facility] Office Visit from 10/29/2022 in [facility]  Total GAD-7 Score 5 9 14 7 4    Mini-Mental    Flowsheet Row Office Visit from 08/28/2021 in [facility] Clinical Support from 07/22/2018 in [facility]  Total Score (max 30 points ) 29 30   PHQ2-9    Flowsheet Row Office Visit from 01/20/2024 in [facility] Office Visit from 11/26/2023 in [facility] Office Visit from 10/02/2023 in [facility] Clinical Support from 07/17/2023 in [facility] Office Visit from 05/29/2023 in [facility]  PHQ-2 Total Score 1 2 2  0 0  PHQ-9 Total Score -- 9 6 1 1    Flowsheet Row Office Visit from 10/19/2021 in [facility] Video Visit from 11/15/2020 in [facility] Video Visit from 08/18/2020 in [facility]  C-SSRS RISK CATEGORY No Risk No Risk No Risk     Assessment and Plan:  Rebecca Moreno is a 58  year old female with a history of schizoaffective disorder, hypothyroidism, obstructive sleep apnea (CPAP machine), stroke, Hyperlipidemia, Hypertension, stroke, who presents for follow up appointment for below.   1. Schizoaffective disorder, unspecified type (HCC) 2. Anxiety disorder, unspecified type Other stressors include: marital  conflict, caregiver of her mother in law    History: tx from [place]. Dx bipolar I disorder, admitted four times for being delusional, last in 2007. No SA. Reportedly had an episode of jumping onto her husband, which led to admission when she was in [place]. Originally on venlafaxine  300 mg daily, lamotrigine  300 mg daily, Vraylar  6 mg, quetiapine  300 mg at night, clonazepam  1 mg BID   The exam is notable for calm affect.  She does not appear to ruminate about the singer, although she expressed frustration toward her husband, who did not allow her to maintain contact with him. While she describes depressive symptoms and anxiety, these appear to  be relatively manageable. Although we try to taper down quetiapine , this led to concerning behavior issues/delusion.  Will maintain on the current medication regimen.  Will continue both Vraylar  and quetiapine  to target schizoaffective disorder.  Will continue lamotrigine  for mood dysregulation.  Will continue venlafaxine  to target depression, anxiety, along with clonazepam  as needed for anxiety.   # benzodiazepine dependence Unchanged. She has been on the current dose of clonazepam  since the initial visit with this writer without obvious concern of overusing this medication.  She declined to taper down this medication.  It is likely not the best timing to taper down clonazepam  at this time, although the hope is to taper off this medication in the future to avoid long term side effect.   # marijuana use She denies any marijuana use on today's evaluation.  Will obtain UDS given she is also on clonazepam .   3. High risk medication use - UDS + marijuana 03/2023  Will obtain UDS given she is on clonazepam .  Noted that she has consistent weight gain; will consider starting metformin at the next visit.        Last checked  EKG HR 83, QTc 410 msec 10/2022  Lipid panels LDL 70, T chol 144 05/2023  HbA1c 6.2 10/2022      Plan Continue venlafaxine  300 mg daily Continue  Lamotrigine  300 mg daily Continue Vraylar  6 mg daily Continue quetiapine  200 mg at night (episode of delusion when tapering down to 50 mg) Continue clonazepam  1 mg twice a day as needed for anxiety  Obtain Urine test (UDS) at [facility] Next appointment: 11/11 at 3:30, IP.  She agrees that she will be dismissed from the practice if she has another no-show/cancellation.  - gabapentin  300 mg BID for pain     Past trials of medication: sertraline, fluoxetine, lexapro, Celexa, duloxetine, Wellbutrin  lithium (psoriasis), Depakote (did not work), olanzapine, Abilify (weight gain), latuda, Geodon    The patient demonstrates the following risk factors for suicide: Chronic risk factors for suicide include: psychiatric disorder of depression. Acute risk factors for suicide include: family or marital conflict and unemployment. Protective factors for this patient include: positive social support and hope for the future. Considering these factors, the overall suicide risk at this point appears to be low. Patient is appropriate for outpatient follow up.    Collaboration of Care: Collaboration of Care: Other reviewed notes in EPic  Patient/Guardian was advised Release of Information must be obtained prior to any record release in order to collaborate their care with an outside provider. Patient/Guardian was advised if they have not already done so to contact the registration department to sign all necessary forms in order for us  to release information regarding their care.   Consent: Patient/Guardian gives verbal consent for treatment and assignment of benefits for services provided during this visit. Patient/Guardian expressed understanding and agreed to proceed.    Katheren Sleet, MD 01/20/2024, 4:17 PM

## 2024-01-20 NOTE — Patient Instructions (Signed)
 Continue venlafaxine  300 mg daily Continue Lamotrigine  300 mg daily Continue Vraylar  6 mg daily Continue quetiapine  200 mg at night Continue clonazepam  1 mg twice a day as needed for anxiety  Obtain Urine test (UDS) at [facility] Next appointment: 11/11 at 3:30,

## 2024-02-03 ENCOUNTER — Ambulatory Visit (HOSPITAL_BASED_OUTPATIENT_CLINIC_OR_DEPARTMENT_OTHER): Admitting: Pulmonary Disease

## 2024-02-05 ENCOUNTER — Encounter (HOSPITAL_BASED_OUTPATIENT_CLINIC_OR_DEPARTMENT_OTHER): Payer: Self-pay | Admitting: Pulmonary Disease

## 2024-02-05 ENCOUNTER — Ambulatory Visit (INDEPENDENT_AMBULATORY_CARE_PROVIDER_SITE_OTHER): Admitting: Pulmonary Disease

## 2024-02-05 VITALS — BP 132/83 | HR 80 | Ht 66.0 in | Wt 246.1 lb

## 2024-02-05 DIAGNOSIS — Z87891 Personal history of nicotine dependence: Secondary | ICD-10-CM | POA: Diagnosis not present

## 2024-02-05 DIAGNOSIS — J439 Emphysema, unspecified: Secondary | ICD-10-CM | POA: Diagnosis not present

## 2024-02-05 DIAGNOSIS — M7989 Other specified soft tissue disorders: Secondary | ICD-10-CM

## 2024-02-05 DIAGNOSIS — Z812 Family history of tobacco abuse and dependence: Secondary | ICD-10-CM

## 2024-02-05 DIAGNOSIS — J432 Centrilobular emphysema: Secondary | ICD-10-CM

## 2024-02-05 DIAGNOSIS — R600 Localized edema: Secondary | ICD-10-CM

## 2024-02-05 DIAGNOSIS — Z23 Encounter for immunization: Secondary | ICD-10-CM | POA: Diagnosis not present

## 2024-02-05 DIAGNOSIS — G4733 Obstructive sleep apnea (adult) (pediatric): Secondary | ICD-10-CM | POA: Diagnosis not present

## 2024-02-05 LAB — BASIC METABOLIC PANEL WITH GFR
BUN/Creatinine Ratio: 13 (ref 9–23)
BUN: 14 mg/dL (ref 6–24)
CO2: 24 mmol/L (ref 20–29)
Calcium: 9 mg/dL (ref 8.7–10.2)
Chloride: 101 mmol/L (ref 96–106)
Creatinine, Ser: 1.05 mg/dL — ABNORMAL HIGH (ref 0.57–1.00)
Glucose: 176 mg/dL — ABNORMAL HIGH (ref 70–99)
Potassium: 4.4 mmol/L (ref 3.5–5.2)
Sodium: 142 mmol/L (ref 134–144)
eGFR: 61 mL/min/1.73 (ref >59)

## 2024-02-05 NOTE — Patient Instructions (Signed)
  Emphysema, predominantly upper lobe dominant --ORDER brand name Symbicort  160-4.5 mcg TWO puffs in the morning and evening. Rinse mouth out after use --CONTINUE Albuterol  TWO puffs AS NEEDED for cough or wheezing --Encourage walking 7000-10,000 steps a day  OSA --Reports her machine is malfunctioning and >5 years --Requesting replacement  --ORDER new autoCPAP 5-20 cm H20   Lower extremity edema --Echocardiogram --OK to take lasix  20 mg x 3 days --ORDER BMET today

## 2024-02-05 NOTE — Progress Notes (Signed)
 Subjective:   PATIENT ID: Rebecca Moreno GENDER: female DOB: 1966-02-26, MRN: [id number]  Chief Complaint  Patient presents with   Establish Care    Shortness of breath    Reason for Visit: New consult for shortness of breath  Ms. Rebecca Moreno is a 58 year old female former smoker with emphysema, asthma, OSA, erythrocytosis s/p phlebotomy, hypothyroidism, psoriasis on dovonex , HLD, CVA 09/2019, CKD IIIa, hypothyroidism and bipolar type I who presents for evaluation for shortness of breath. Daughter, who is a PA, presents and provides history.  For her emphysema which was diagnosed on CT and asthma she was previously on ipratropium inhaler for a few years.  Currently on Symbicort  two puffs in the morning. Uses rescue 3 times a day. She finds its hard to breath after talking. Denies wheezing or coughing. Has not had frequent URIs. Recently had lower extremity swelling this summer and trialed lasix  with some improvement but not resolved.  Social History: Former smoker. Quit 2024. 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  Past Medical History:  Diagnosis Date   Acute pyelonephritis    Asthma    Bipolar disorder (HCC)    Cerebrovascular accident (CVA) due to occlusion of left carotid artery (HCC) 09/18/2019   Last Assessment & Plan:   Formatting of this note might be different from the original.     Colon polyp    Complication of anesthesia    sometimes hard to put to sleep   Depression    Bipolar   Gastric ulcer    around 2013.  stress related   GERD (gastroesophageal reflux disease)    History of kidney stones    Hyperlipidemia    Hypothyroid    Polyp, uterus corpus    Sepsis due to Escherichia coli (E. coli) (HCC) 09/04/2018   Sleep apnea    no cpap used   Stroke [facility])      Family History  Problem  Relation Age of Onset   Anxiety disorder Mother    Hypertension Mother    Heart failure Mother    Stroke Mother    Hyperlipidemia Father    Diabetes Father    Stroke Father    Hypertension Father    Colon polyps Father        older than 37   Diabetes Sister    Hypertension Sister    Ovarian cancer Sister    Ovarian cancer Maternal Grandmother    Colon cancer Maternal Grandmother    Renal Disease Paternal Grandmother    Heart attack Paternal Grandfather    Asthma Daughter    Bipolar disorder Other    Asthma Niece      Social History   Occupational History   Occupation: disability  Tobacco Use   Smoking status: Former    Current packs/day: 0.00    Average packs/day: 0.5 packs/day for 40.0 years (20.0 ttl pk-yrs)    Types: Cigarettes    Start date: 33    Quit date: 2024    Years since quitting: 1.7   Smokeless tobacco: Never   Tobacco comments:    Is smoking about 0.5 of a pack and has a patch placed on   Vaping Use   Vaping status: Never Used  Substance and Sexual Activity   Alcohol use: Never   Drug use: Not Currently   Sexual activity: Yes  Partners: Male    Birth control/protection: Surgical    Comment: tubal    Allergies  Allergen Reactions   Lithium Other (See Comments)    Psoriasis      Outpatient Medications Prior to Visit  Medication Sig Dispense Refill   albuterol  (VENTOLIN  HFA) 108 (90 Base) MCG/ACT inhaler Inhale 1 puff into the lungs every 6 (six) hours as needed for wheezing or shortness of breath. 6.7 g 1   atorvastatin  (LIPITOR) 80 MG tablet Take 1 tablet (80 mg total) by mouth at bedtime. 100 tablet 3   budesonide -formoterol  (SYMBICORT ) 80-4.5 MCG/ACT inhaler Inhale 2 puffs into the lungs 2 (two) times daily. Rinse mouth after each use. To replace flovent  10.2 g 3   CALCIUM  CITRATE-VITAMIN D3 PO Take 1 tablet by mouth daily.     Cariprazine  HCl (VRAYLAR ) 6 MG CAPS Take 1 capsule (6 mg total) by mouth daily. 30 capsule 1   Cholecalciferol  (VITAMIN D3) 5000 units TABS Take 5,000 Units by mouth daily.      clonazePAM  (KLONOPIN ) 1 MG tablet Take 1 tablet (1 mg total) by mouth 2 (two) times daily. 60 tablet 0   Dexlansoprazole  30 MG capsule DR TAKE ONE CAPSULE DAILY 90 capsule 1   famotidine  (PEPCID ) 20 MG tablet TAKE ONE TABLET BY MOUTH EVERY DAY 90 tablet 0   fluticasone  (FLONASE ) 50 MCG/ACT nasal spray SPRAY ONE SPRAY IN EACH NOSTRIL DAILY AT night TO help with morning congestion 16 g 5   gabapentin  (NEURONTIN ) 300 MG capsule TAKE ONE CAPSULE AT SUPPER AND AT BEDTIME 180 capsule 1   [START ON 02/11/2024] lamoTRIgine  (LAMICTAL ) 100 MG tablet Take 3 tablets (300 mg total) by mouth at bedtime. 90 tablet 1   levothyroxine  (SYNTHROID ) 50 MCG tablet TAKE ONE TABLET DAILY 90 tablet 3   loratadine  (CLARITIN ) 10 MG tablet TAKE ONE TABLET DAILY 30 tablet 5   magnesium gluconate (MAGONATE) 500 MG tablet Take 500 mg by mouth daily.     Multiple Vitamins-Minerals (MULTIVITAMIN WOMEN PO) Take 1 tablet by mouth daily.     QUEtiapine  (SEROQUEL ) 200 MG tablet Take 1 tablet (200 mg total) by mouth at bedtime. 30 tablet 1   tiZANidine  (ZANAFLEX ) 4 MG tablet TAKE ONE-HALF TO 1 TABLET EVERY 8 HOURS AS NEEDED FOR MUSCLE SPASMS 60 tablet 1   [START ON 02/11/2024] venlafaxine  XR (EFFEXOR -XR) 150 MG 24 hr capsule Take 2 capsules (300 mg total) by mouth daily. 60 capsule 1   betamethasone  valerate ointment (VALISONE ) 0.1 % Apply topically 2 (two) times daily as needed. (Patient not taking: Reported on 02/05/2024) 45 g 5   calcipotriene  (DOVONEX ) 0.005 % cream Apply topically 2 (two) times daily. For psoriasis (Patient not taking: Reported on 02/05/2024) 60 g 0   clobetasol  (TEMOVATE ) 0.05 % external solution Apply 1 Application topically 2 (two) times daily as needed (psoriasis of scalp). (Patient not taking: Reported on 02/05/2024) 50 mL 2   furosemide  (LASIX ) 20 MG tablet Take 1 tab daily for 5 days, okay to take PRN for 7 days (Patient not taking: Reported on  02/05/2024) 30 tablet 0   triamcinolone  cream (KENALOG ) 0.1 % Apply 1 Application topically 2 (two) times daily. (Patient not taking: Reported on 02/05/2024) 80 g 0   No facility-administered medications prior to visit.    Review of Systems  Constitutional:  Negative for chills, diaphoresis, fever, malaise/fatigue and weight loss.  HENT:  Negative for congestion.   Respiratory:  Positive for shortness of breath. Negative for cough,  hemoptysis, sputum production and wheezing.   Cardiovascular:  Negative for chest pain, palpitations and leg swelling.     Objective:   Vitals:   02/05/24 1453  BP: 132/83  Pulse: 80  SpO2: 96%  Weight: 246 lb 1.6 oz (111.6 kg)  Height: 5' 6 (1.676 m)   SpO2: 96 %  Physical Exam: General: Well-appearing, no acute distress HENT: [place], AT Eyes: EOMI, no scleral icterus Respiratory: Clear to auscultation bilaterally.  No crackles, wheezing or rales Cardiovascular: RRR, -M/R/G, no JVD Extremities:-Edema,-tenderness Neuro: AAO x4, CNII-XII grossly intact Psych: Normal mood, normal affect  Data Reviewed:  Imaging: CT Chest 11/27/23 - mild centrilobular and paraseptal emphysema, upper lobe predominance. Scattered subcentimeter nodules on right lung with largest 4 mm in RML  PFT: 11/15/21 FVC 2.31 (66%) FEV1 1.72 (63%) Ratio 74   Interpretation: Reduced FEV1 and FVC   Labs: CBC    Component Value Date/Time   WBC 8.5 11/26/2023 1620   WBC 7.5 12/03/2022 1237   RBC 4.41 11/26/2023 1620   RBC 4.10 12/03/2022 1237   HGB 14.7 11/26/2023 1620   HCT 44.9 11/26/2023 1620   PLT 210 11/26/2023 1620   MCV 102 (H) 11/26/2023 1620   MCH 33.3 (H) 11/26/2023 1620   MCH 34.1 (H) 12/03/2022 1237   MCHC 32.7 11/26/2023 1620   MCHC 32.9 12/03/2022 1237   RDW 12.4 11/26/2023 1620   LYMPHSABS 1.6 12/03/2022 1237   LYMPHSABS 1.7 07/06/2020 1447   MONOABS 0.7 12/03/2022 1237   EOSABS 0.0 12/03/2022 1237   EOSABS 0.0 07/06/2020 1447   BASOSABS 0.1 12/03/2022  1237   BASOSABS 0.0 07/06/2020 1447   Absolute eos 12/03/22 -0 PSG:  02/12/2019 - AHI during Stage REM sleep was 15.1 per hour.  1. Marked hypoventilation syndrome is documented with this recording with mean oxygen  saturation 81% and a nadir of 64 % with 319 minutes oxygenation below 88%.  Nocturnal oxygen  2 L is recommended.   2. Mild obstructive sleep apnea syndrome worse during REM sleep is also documented.  The severity does not require positive pressure treatment.   3. Absent slow wave sleep is also noted.    Assessment & Plan:   Discussion: 58 year old female former smoker with emphysema, asthma, OSA, erythrocytosis s/p phlebotomy, hypothyroidism, psoriasis on dovonex , HLD, CVA 09/2019, CKD IIIa, hypothyroidism and bipolar type I who presents for evaluation for shortness of breath. CT with mild emphysema which is untreated and not currently on maintenance therapy. Deconditioning also likely playing a role. OSA uncontrolled with malfunctioning CPAP machine. Will evaluate for secondary PH in setting of uncontrolled OSA, LE edema and dyspnea.    Emphysema, predominantly upper lobe dominant Shortness of breath --ORDER brand name Symbicort  160-4.5 mcg TWO puffs in the morning and evening. Rinse mouth out after use --CONTINUE Albuterol  TWO puffs AS NEEDED for cough or wheezing --Encourage walking 7000-10,000 steps a day  OSA --Reports her machine is malfunctioning and >5 years --Requesting replacement  --ORDER new autoCPAP 5-20 cm H20   Lower extremity edema --Echocardiogram --OK to take lasix  20 mg x 3 days --ORDER BMET today   Health Maintenance Immunization History  Administered Date(s) Administered   Influenza Inj Mdck Quad Pf 02/03/2018   Influenza, Quadrivalent, Recombinant, Inj, Pf 03/07/2017   Influenza, Seasonal, Injecte, Preservative Fre 03/26/2023   Influenza,inj,Quad PF,6+ Mos 05/27/2019, 02/29/2020, 01/17/2022   Influenza,trivalent, recombinat, inj, PF 03/04/2014,  01/19/2015   Influenza-Unspecified 03/11/2017, 03/03/2021   Moderna Covid-19 Fall Seasonal Vaccine 2yrs & older  05/14/2022, 03/19/2023   Moderna Covid-19 Vaccine Bivalent Booster 40yrs & up 02/21/2021   Moderna Sars-Covid-2 Vaccination 07/27/2019, 08/24/2019, 04/22/2020, 09/27/2020   Pneumococcal Conjugate-13 01/06/2016   Pneumococcal Polysaccharide-23 02/09/2015, 02/13/2018   Tdap 09/20/2015, 01/06/2016   Zoster Recombinant(Shingrix) 07/18/2021, 09/19/2021   CT Lung Screen - enrolled  Orders Placed This Encounter  Procedures   Basic Metabolic Panel (BMET)   Ambulatory Referral for Lung Cancer Scre    Referral Priority:   Routine    Referral Type:   Consultation    Referral Reason:   Specialty Services Required    Number of Visits Requested:   1   ECHOCARDIOGRAM COMPLETE    Standing Status:   Future    Expiration Date:   02/04/2025    Where should this test be performed:   [facility]    Perflutren DEFINITY (image enhancing agent) should be administered unless hypersensitivity or allergy exist:   Administer Perflutren    Reason for exam-Echo:   Other-Full Diagnosis List    Full ICD-10/Reason for Exam:   Swelling of lower extremity [[id number]]   Pulmonary function test    Standing Status:   Future    Expiration Date:   02/04/2025    Where should this test be performed?:   Outpatient [facility]    What type of PFT is being ordered?:   Full PFT  No orders of the defined types were placed in this encounter.   No follow-ups on file. F/u after PFT in 3-4 months  I have spent a total time of 60-minutes on the day of the appointment reviewing prior documentation, coordinating care and discussing medical diagnosis and plan with the patient/family. Imaging, labs and tests included in this note have been reviewed and interpreted independently by me.  Argyle Gustafson Slater Staff, MD [facility] 02/05/2024 3:47 PM

## 2024-02-06 ENCOUNTER — Ambulatory Visit: Payer: Self-pay | Admitting: Pulmonary Disease

## 2024-02-11 ENCOUNTER — Encounter (HOSPITAL_BASED_OUTPATIENT_CLINIC_OR_DEPARTMENT_OTHER): Payer: Self-pay | Admitting: Pulmonary Disease

## 2024-02-18 ENCOUNTER — Telehealth (HOSPITAL_BASED_OUTPATIENT_CLINIC_OR_DEPARTMENT_OTHER): Payer: Self-pay

## 2024-02-21 NOTE — Telephone Encounter (Addendum)
 Recent referral for CPAP supplies  was sent to [facility]. Awaiting confirmation.

## 2024-03-02 ENCOUNTER — Other Ambulatory Visit: Payer: Self-pay | Admitting: Psychiatry

## 2024-03-02 NOTE — Telephone Encounter (Signed)
 Please advise her to obtain urine screening test so that I can fill this medication.

## 2024-03-06 ENCOUNTER — Ambulatory Visit (HOSPITAL_COMMUNITY)
Admission: RE | Admit: 2024-03-06 | Discharge: 2024-03-06 | Disposition: A | Discharge status: Home / Self Care | Source: Ambulatory Visit | Attending: Vascular Surgery | Admitting: Vascular Surgery

## 2024-03-06 DIAGNOSIS — Z87891 Personal history of nicotine dependence: Secondary | ICD-10-CM | POA: Insufficient documentation

## 2024-03-06 DIAGNOSIS — R600 Localized edema: Secondary | ICD-10-CM

## 2024-03-06 DIAGNOSIS — R609 Edema, unspecified: Secondary | ICD-10-CM | POA: Diagnosis not present

## 2024-03-06 DIAGNOSIS — E669 Obesity, unspecified: Secondary | ICD-10-CM | POA: Insufficient documentation

## 2024-03-06 DIAGNOSIS — M7989 Other specified soft tissue disorders: Secondary | ICD-10-CM | POA: Insufficient documentation

## 2024-03-06 LAB — ECHOCARDIOGRAM COMPLETE
AR max vel: 2.85 cm2
AV Area VTI: 2.99 cm2
AV Area mean vel: 2.85 cm2
AV Mean grad: 2 mmHg
AV Peak grad: 4.4 mmHg
Ao pk vel: 1.05 m/s
Area-P 1/2: 4.49 cm2
S' Lateral: 2.91 cm

## 2024-03-06 NOTE — Telephone Encounter (Signed)
 Order for CPAP machine and supplies was sen to [facility]. [facility] confirmed received. Called Rebecca Moreno to provide an update, no answer and unable to leave voicemail. Will reach back out to patient again.

## 2024-03-09 ENCOUNTER — Encounter: Payer: Self-pay | Admitting: Radiology

## 2024-03-15 NOTE — Progress Notes (Deleted)
 BH MD/PA/NP OP Progress Note  03/15/2024 10:59 AM Rebecca Moreno  MRN:  [id number]  Chief Complaint: No chief complaint on file.  HPI: ***  uds  Daily routine: takes care of her mother in law Employment: on disability after TMJ surgery in 2007. 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 Her parents had marital discordance and the patient screamed when she was a child as she did not want to hear them. She reports good relationship with both of her parents otherwise. She has one sister, who she has good relationship with   Substance use   Tobacco Alcohol Other substances/  Current Quit since Dec 2023 denies denies  Past   denies Marijuana   Past Treatment             Visit Diagnosis: No diagnosis found.  Past Psychiatric History: Please see initial evaluation for full details. I have reviewed the history. No updates at this time.     Past Medical History:  Past Medical History:  Diagnosis Date   Acute pyelonephritis    Asthma    Bipolar disorder (HCC)    Cerebrovascular accident (CVA) due to occlusion of left carotid artery (HCC) 09/18/2019   Last Assessment & Plan:   Formatting of this note might be different from the original.     Colon polyp    Complication of anesthesia    sometimes hard to put to sleep   Depression    Bipolar   Gastric ulcer    around 2013.  stress related   GERD (gastroesophageal reflux disease)    History of kidney stones    Hyperlipidemia    Hypothyroid    Polyp, uterus corpus    Sepsis due to Escherichia coli (E. coli) (HCC) 09/04/2018   Sleep apnea    no cpap used   Stroke [facility])     Past Surgical History:  Procedure Laterality Date   ABLATION ON ENDOMETRIOSIS     BREAST BIOPSY Right 11/14/2023   US  RT BREAST BX W LOC DEV 1ST LESION IMG BX SPEC US  GUIDE 11/14/2023 Mir, Aliene SAUNDERS, MD [facility]-ULTRASOUND   COLONOSCOPY      Per patient, done around 2013 in [place] , had polyp and overdue for follow up.   COLONOSCOPY WITH PROPOFOL  N/A 05/12/2018   Procedure: COLONOSCOPY WITH PROPOFOL ;  Surgeon: Shaaron Lamar HERO, MD;  Location: [facility];  Service: Endoscopy;  Laterality: N/A;  12:00pm   CYSTOSCOPY W/ URETERAL STENT PLACEMENT Left 09/01/2018   Procedure: CYSTOSCOPY WITH RETROGRADE PYELOGRAM/URETERAL STENT PLACEMENT;  Surgeon: Devere Lonni Righter, MD;  Location: [facility];  Service: Urology;  Laterality: Left;   CYSTOSCOPY/URETEROSCOPY/HOLMIUM LASER/STENT PLACEMENT Left 09/17/2018   Procedure: CYSTOSCOPY/URETEROSCOPY/HOLMIUM LASER/STENT PLACEMENT;  Surgeon: Devere Lonni Righter, MD;  Location: [facility];  Service: Urology;  Laterality: Left;  ONLY NEEDS 45 MIN   OVARIAN CYST SURGERY Left    POLYPECTOMY  05/12/2018   Procedure: POLYPECTOMY;  Surgeon: Shaaron Lamar HERO, MD;  Location: [facility];  Service: Endoscopy;;  polyp at splenic flexure   TEMPOROMANDIBULAR JOINT SURGERY     9 surgeries   TUBAL LIGATION     vaginal childbirth     1    Family Psychiatric History: Please see initial evaluation for full details. I have reviewed the history. No updates at this time.    Family History:  Family History  Problem  Relation Age of Onset   Anxiety disorder Mother    Hypertension Mother    Heart failure Mother    Stroke Mother    Hyperlipidemia Father    Diabetes Father    Stroke Father    Hypertension Father    Colon polyps Father        older than 65   Diabetes Sister    Hypertension Sister    Ovarian cancer Sister    Ovarian cancer Maternal Grandmother    Colon cancer Maternal Grandmother    Renal Disease Paternal Grandmother    Heart attack Paternal Grandfather    Asthma Daughter    Bipolar disorder Other    Asthma Niece     Social History:  Social History   Socioeconomic History   Marital status: Married    Spouse name: Not on file   Number of children: 1   Years of education: Not on  file   Highest education level: High school graduate  Occupational History   Occupation: disability  Tobacco Use   Smoking status: Former    Current packs/day: 0.00    Average packs/day: 0.5 packs/day for 40.0 years (20.0 ttl pk-yrs)    Types: Cigarettes    Start date: 62    Quit date: 2024    Years since quitting: 1.8   Smokeless tobacco: Never   Tobacco comments:    Is smoking about 0.5 of a pack and has a patch placed on   Vaping Use   Vaping status: Never Used  Substance and Sexual Activity   Alcohol use: Never   Drug use: Not Currently   Sexual activity: Yes    Partners: Male    Birth control/protection: Surgical    Comment: tubal  Other Topics Concern   Not on file  Social History Narrative   Not on file   Social Drivers of Health   Financial Resource Strain: Low Risk  (12/19/2023)   Received from [facility]   Overall Financial Resource Strain (CARDIA)    How hard is it for you to pay for the very basics like food, housing, medical care, and heating?: Not hard at all  Food Insecurity: No Food Insecurity (12/19/2023)   Received from [facility]   Hunger Vital Sign    Within the past 12 months, you worried that your food would run out before you got the money to buy more.: Never true    Within the past 12 months, the food you bought just didn't last and you didn't have money to get more.: Never true  Transportation Needs: No Transportation Needs (12/19/2023)   Received from [facility] - Transportation    In the past 12 months, has lack of transportation kept you from medical appointments or from getting medications?: No    In the past 12 months, has lack of transportation kept you from meetings, work, or from getting things needed for daily living?: No  Physical Activity: Inactive (07/17/2023)   Exercise Vital Sign    Days of Exercise per Week: 0 days    Minutes of Exercise per Session: 0 min  Stress: No Stress Concern Present (07/17/2023)    [employer] of Occupational Health - Occupational Stress Questionnaire    Feeling of Stress : Not at all  Social Connections: Socially Integrated (07/17/2023)   Social Connection and Isolation Panel    Frequency of Communication with Friends and Family: More than three times a week    Frequency of Social  Gatherings with Friends and Family: Three times a week    Attends Religious Services: More than 4 times per year    Active Member of Clubs or Organizations: Yes    Attends Banker Meetings: More than 4 times per year    Marital Status: Married  Recent Concern: Social Connections - Moderately Isolated (05/29/2023)   Social Connection and Isolation Panel    Frequency of Communication with Friends and Family: More than three times a week    Frequency of Social Gatherings with Friends and Family: More than three times a week    Attends Religious Services: Never    Database Administrator or Organizations: No    Attends Banker Meetings: Never    Marital Status: Married    Allergies:  Allergies  Allergen Reactions   Lithium Other (See Comments)    Psoriasis     Metabolic Disorder Labs: Lab Results  Component Value Date   HGBA1C 5.4 05/29/2023   No results found for: PROLACTIN Lab Results  Component Value Date   CHOL 144 05/29/2023   TRIG 157 (H) 05/29/2023   HDL 47 05/29/2023   CHOLHDL 3.1 05/29/2023   LDLCALC 70 05/29/2023   LDLCALC 134 (H) 12/18/2018   Lab Results  Component Value Date   TSH 1.720 10/02/2023   TSH 2.760 05/29/2023    Therapeutic Level Labs: No results found for: LITHIUM No results found for: VALPROATE No results found for: CBMZ  Current Medications: Current Outpatient Medications  Medication Sig Dispense Refill   albuterol  (VENTOLIN  HFA) 108 (90 Base) MCG/ACT inhaler Inhale 1 puff into the lungs every 6 (six) hours as needed for wheezing or shortness of breath. 6.7 g 1   atorvastatin  (LIPITOR) 80 MG tablet  Take 1 tablet (80 mg total) by mouth at bedtime. 100 tablet 3   betamethasone  valerate ointment (VALISONE ) 0.1 % Apply topically 2 (two) times daily as needed. (Patient not taking: Reported on 02/05/2024) 45 g 5   budesonide -formoterol  (SYMBICORT ) 80-4.5 MCG/ACT inhaler Inhale 2 puffs into the lungs 2 (two) times daily. Rinse mouth after each use. To replace flovent  10.2 g 3   calcipotriene  (DOVONEX ) 0.005 % cream Apply topically 2 (two) times daily. For psoriasis (Patient not taking: Reported on 02/05/2024) 60 g 0   CALCIUM  CITRATE-VITAMIN D3 PO Take 1 tablet by mouth daily.     Cariprazine  HCl (VRAYLAR ) 6 MG CAPS Take 1 capsule (6 mg total) by mouth daily. 30 capsule 1   Cholecalciferol (VITAMIN D3) 5000 units TABS Take 5,000 Units by mouth daily.      clobetasol  (TEMOVATE ) 0.05 % external solution Apply 1 Application topically 2 (two) times daily as needed (psoriasis of scalp). (Patient not taking: Reported on 02/05/2024) 50 mL 2   clonazePAM  (KLONOPIN ) 1 MG tablet Take 1 tablet (1 mg total) by mouth 2 (two) times daily. 60 tablet 0   Dexlansoprazole  30 MG capsule DR TAKE ONE CAPSULE DAILY 90 capsule 1   famotidine  (PEPCID ) 20 MG tablet TAKE ONE TABLET BY MOUTH EVERY DAY 90 tablet 0   fluticasone  (FLONASE ) 50 MCG/ACT nasal spray SPRAY ONE SPRAY IN EACH NOSTRIL DAILY AT night TO help with morning congestion 16 g 5   furosemide  (LASIX ) 20 MG tablet Take 1 tab daily for 5 days, okay to take PRN for 7 days (Patient not taking: Reported on 02/05/2024) 30 tablet 0   gabapentin  (NEURONTIN ) 300 MG capsule TAKE ONE CAPSULE AT SUPPER AND AT BEDTIME 180 capsule 1  lamoTRIgine  (LAMICTAL ) 100 MG tablet Take 3 tablets (300 mg total) by mouth at bedtime. 90 tablet 1   levothyroxine  (SYNTHROID ) 50 MCG tablet TAKE ONE TABLET DAILY 90 tablet 3   loratadine  (CLARITIN ) 10 MG tablet TAKE ONE TABLET DAILY 30 tablet 5   magnesium gluconate (MAGONATE) 500 MG tablet Take 500 mg by mouth daily.     Multiple  Vitamins-Minerals (MULTIVITAMIN WOMEN PO) Take 1 tablet by mouth daily.     QUEtiapine  (SEROQUEL ) 200 MG tablet Take 1 tablet (200 mg total) by mouth at bedtime. 30 tablet 1   tiZANidine  (ZANAFLEX ) 4 MG tablet TAKE ONE-HALF TO 1 TABLET EVERY 8 HOURS AS NEEDED FOR MUSCLE SPASMS 60 tablet 1   triamcinolone  cream (KENALOG ) 0.1 % Apply 1 Application topically 2 (two) times daily. (Patient not taking: Reported on 02/05/2024) 80 g 0   venlafaxine  XR (EFFEXOR -XR) 150 MG 24 hr capsule Take 2 capsules (300 mg total) by mouth daily. 60 capsule 1   No current facility-administered medications for this visit.     Musculoskeletal: Strength & Muscle Tone: within normal limits Gait & Station: normal Patient leans: N/A  Psychiatric Specialty Exam: Review of Systems  Last menstrual period 12/13/2017.There is no height or weight on file to calculate BMI.  General Appearance: {Appearance:22683}  Eye Contact:  {BHH EYE CONTACT:22684}  Speech:  Clear and Coherent  Volume:  Normal  Mood:  {BHH MOOD:22306}  Affect:  {Affect (PAA):22687}  Thought Process:  Coherent  Orientation:  Full (Time, Place, and Person)  Thought Content: Logical   Suicidal Thoughts:  {ST/HT (PAA):22692}  Homicidal Thoughts:  {ST/HT (PAA):22692}  Memory:  Immediate;   Good  Judgement:  {Judgement (PAA):22694}  Insight:  {Insight (PAA):22695}  Psychomotor Activity:  Normal  Concentration:  Concentration: Good and Attention Span: Good  Recall:  Good  Fund of Knowledge: Good  Language: Good  Akathisia:  No  Handed:  Right  AIMS (if indicated): not done  Assets:  Communication Skills  ADL's:  Intact  Cognition: WNL  Sleep:  {BHH GOOD/FAIR/POOR:22877}   Screenings: GAD-7    Loss Adjuster, Chartered Office Visit from 01/20/2024 in [facility] Office Visit from 11/26/2023 in [facility] Office Visit from 10/02/2023 in [facility]  Office Visit from 05/29/2023 in [facility] Office Visit from 03/21/2023 in [facility]  Total GAD-7 Score 8 5 9 14 7    Mini-Mental    Flowsheet Row Office Visit from 08/28/2021 in [facility] Clinical Support from 07/22/2018 in [facility]  Total Score (max 30 points ) 29 30   PHQ2-9    Flowsheet Row Office Visit from 01/20/2024 in [facility] Office Visit from 11/26/2023 in [facility] Office Visit from 10/02/2023 in [facility] Clinical Support from 07/17/2023 in [facility] Office Visit from 05/29/2023 in [facility]  PHQ-2 Total Score 1 2 2  0 0  PHQ-9 Total Score -- 9 6 1 1    Flowsheet Row Office Visit from 10/19/2021 in [facility] Video Visit from 11/15/2020 in [facility] Video Visit from 08/18/2020 in [facility]  C-SSRS RISK CATEGORY No Risk No Risk No Risk     Assessment and Plan:  Rebecca Moreno is a 58  year old female with a history of schizoaffective disorder, hypothyroidism, obstructive sleep apnea (CPAP machine), stroke, Hyperlipidemia, Hypertension, stroke, who presents for follow up appointment for below.    1. Schizoaffective disorder, unspecified type (HCC) 2. Anxiety disorder, unspecified type Other stressors include: marital conflict, caregiver of her mother in law    History: tx from [place]. Dx bipolar I disorder, admitted four times for being delusional, last in 2007. No SA. Reportedly had an episode of jumping onto her husband, which led to admission when she was in [place]. Originally on venlafaxine  300 mg daily, lamotrigine  300 mg daily,  Vraylar  6 mg, quetiapine  300 mg at night, clonazepam  1 mg BID   The exam is notable for calm affect.  She does not appear to ruminate about the singer, although she expressed frustration toward her husband, who did not allow her to maintain contact with him. While she describes depressive symptoms and anxiety, these appear to be relatively manageable. Although we try to taper down quetiapine , this led to concerning behavior issues/delusion.  Will maintain on the current medication regimen.  Will continue both Vraylar  and quetiapine  to target schizoaffective disorder.  Will continue lamotrigine  for mood dysregulation.  Will continue venlafaxine  to target depression, anxiety, along with clonazepam  as needed for anxiety.    # benzodiazepine dependence Unchanged. She has been on the current dose of clonazepam  since the initial visit with this writer without obvious concern of overusing this medication.  She declined to taper down this medication.  It is likely not the best timing to taper down clonazepam  at this time, although the hope is to taper off this medication in the future to avoid long term side effect.    # marijuana use She denies any marijuana use on today's evaluation.  Will obtain UDS given she is also on clonazepam .    3. High risk medication use - UDS + marijuana 03/2023  Will obtain UDS given she is on clonazepam .  Noted that she has consistent weight gain; will consider starting metformin at the next visit.        Last checked  EKG HR 83, QTc 410 msec 10/2022  Lipid panels LDL 70, T chol 144 05/2023  HbA1c 6.2 10/2022      Plan Continue venlafaxine  300 mg daily Continue Lamotrigine  300 mg daily Continue Vraylar  6 mg daily Continue quetiapine  200 mg at night (episode of delusion when tapering down to 50 mg) Continue clonazepam  1 mg twice a day as needed for anxiety  Obtain Urine test (UDS) at [facility] Next appointment: 11/11 at 3:30, IP.  She agrees that she will be dismissed from  the practice if she has another no-show/cancellation.  - gabapentin  300 mg BID for pain      Past trials of medication: sertraline, fluoxetine, lexapro, Celexa, duloxetine, Wellbutrin  lithium (psoriasis), Depakote (did not work), olanzapine, Abilify (weight gain), latuda, Geodon    The patient demonstrates the following risk factors for suicide: Chronic risk factors for suicide include: psychiatric disorder of depression. Acute risk factors for suicide include: family or marital conflict and unemployment. Protective factors for this patient include: positive social support and hope for the future. Considering these factors, the overall suicide risk at this point appears to be low. Patient is appropriate for outpatient follow up.    Collaboration of Care: Collaboration of Care: {BH OP Collaboration of Care:21014065}  Patient/Guardian was advised Release of Information must be obtained prior to any record release in  order to collaborate their care with an outside provider. Patient/Guardian was advised if they have not already done so to contact the registration department to sign all necessary forms in order for us  to release information regarding their care.   Consent: Patient/Guardian gives verbal consent for treatment and assignment of benefits for services provided during this visit. Patient/Guardian expressed understanding and agreed to proceed.    Katheren Sleet, MD 03/15/2024, 10:59 AM

## 2024-03-17 ENCOUNTER — Ambulatory Visit: Admitting: Psychiatry

## 2024-03-17 ENCOUNTER — Other Ambulatory Visit: Payer: Self-pay | Admitting: Psychiatry

## 2024-03-17 MED ORDER — QUETIAPINE FUMARATE 200 MG PO TABS: 200.0000 mg | ORAL_TABLET | Freq: Every day | ORAL | 0 refills | Status: DC

## 2024-03-17 MED ORDER — CLONAZEPAM 1 MG PO TABS: 1.0000 mg | ORAL_TABLET | Freq: Two times a day (BID) | ORAL | 0 refills | Status: DC

## 2024-03-30 ENCOUNTER — Telehealth (HOSPITAL_BASED_OUTPATIENT_CLINIC_OR_DEPARTMENT_OTHER): Payer: Self-pay

## 2024-03-30 NOTE — Telephone Encounter (Signed)
 CMN received for CPAP supplies sent to [facility] signed by provider and faxed confirmation received

## 2024-03-31 ENCOUNTER — Other Ambulatory Visit: Payer: Self-pay | Admitting: Psychiatry

## 2024-03-31 DIAGNOSIS — F259 Schizoaffective disorder, unspecified: Secondary | ICD-10-CM

## 2024-04-04 ENCOUNTER — Other Ambulatory Visit: Payer: Self-pay | Admitting: Family Medicine

## 2024-04-04 DIAGNOSIS — J4530 Mild persistent asthma, uncomplicated: Secondary | ICD-10-CM

## 2024-04-04 DIAGNOSIS — R0902 Hypoxemia: Secondary | ICD-10-CM

## 2024-04-04 DIAGNOSIS — Z87891 Personal history of nicotine dependence: Secondary | ICD-10-CM

## 2024-04-07 ENCOUNTER — Other Ambulatory Visit: Payer: Self-pay | Admitting: Family Medicine

## 2024-04-07 DIAGNOSIS — F259 Schizoaffective disorder, unspecified: Secondary | ICD-10-CM

## 2024-04-08 ENCOUNTER — Telehealth: Payer: Self-pay | Admitting: Family Medicine

## 2024-04-08 ENCOUNTER — Other Ambulatory Visit: Payer: Self-pay | Admitting: Family Medicine

## 2024-04-08 NOTE — Telephone Encounter (Signed)
 Copied from CRM [id number]. Topic: Clinical - Medication Refill >> Apr 08, 2024  2:48 PM Carrielelia G wrote: Medication:  clonazePAM  (KLONOPIN ) 1 MG tablet VRAYLAR  6 MG CAPS lamoTRIgine  (LAMICTAL ) 100 MG tablet      Has the patient contacted their pharmacy? Yes (Agent: If no, request that the patient contact the pharmacy for the refill. If patient does not wish to contact the pharmacy document the reason why and proceed with request.) (Agent: If yes, when and what did the pharmacy advise?)  This is the patient's preferred pharmacy:  [facility] [place], [place] - [address] [place] [address] Phone: [phone] Fax: [phone]  Is this the correct pharmacy for this prescription? Yes If no, delete pharmacy and type the correct one.   Is the patient out of the medication? Yes  Has the patient been seen for an appointment in the last year OR does the patient have an upcoming appointment? Yes  Can we respond through MyChart? No  Agent: Please be advised that Rx refills may take up to 3 business days. We ask that you follow-up with your pharmacy.

## 2024-04-09 NOTE — Telephone Encounter (Signed)
 Unable to pend Vraylar 

## 2024-04-15 NOTE — Telephone Encounter (Signed)
 Jamie faxed CMN for CPAP and supplies on 11/24 to [facility]. The order was signed by provider, and confirmation of receipt obtained. Multiple attempts were made to reach the patient with no response regarding whether patient received CPAP and supplies and to determine if any further assistance was needed. Mrs. Kalp will reach out if additional assistance is needed. Nothing else is required at this time.

## 2024-04-17 ENCOUNTER — Other Ambulatory Visit: Payer: Self-pay | Admitting: *Deleted

## 2024-04-17 DIAGNOSIS — F259 Schizoaffective disorder, unspecified: Secondary | ICD-10-CM

## 2024-04-17 NOTE — Telephone Encounter (Signed)
 Also, it appears the reason she is asking me for this medication is that she was DISMISSED from her psychiatrist due to repeated no shows 03/17/2024.

## 2024-04-17 NOTE — Telephone Encounter (Signed)
 I don't recall her asking me to do this.  She was suppsed to see me for follow up in Oct.  I'm glad to take them over ONLY IF she is stable on the medications (meaning mental health is CONTROLLED on meds).  If not, she will need to continue seeing a psychiatrist and I can refer her to a different one if needed.  Please clarify and make sure she has an OV scheduled with me for follow up

## 2024-04-17 NOTE — Telephone Encounter (Signed)
 TC from Diane w/ [facility] pharmacy who does pt's pill pack Pt is requesting RFs on her lamotrigine , Vraylar  & clonazepam . States that she is no longer seeing Dr Vickey and that PCP was taking over these medications. Informed Diane that I would put a message in about the lamotrigine  & Vraylar  but the clonazepam  would require a visit. Please advise

## 2024-04-17 NOTE — Telephone Encounter (Signed)
 LMOVM that she will NTBS, she was to FU in Oct w/ PCP and she will need to see her in order to take over these medications. Gave her my direct ph # to call back to sched appt. Called pharmacy back to let them know.

## 2024-05-01 ENCOUNTER — Ambulatory Visit: Payer: Self-pay

## 2024-05-01 NOTE — Telephone Encounter (Signed)
 FYI Only or Action Required?: FYI only for provider: appointment scheduled on 05/05/24.  Patient was last seen in primary care on 11/26/2023 by Jolinda Norene HERO, DO.  Called Nurse Triage reporting Depression.  Symptoms began Chronic.  Interventions attempted: Other: Requested med refills.  Symptoms are: gradually worsening.  Triage Disposition: See PCP When Office is Open (Within 3 Days)  Patient/caregiver understands and will follow disposition?: Yes, but will wait  Message from Delon HERO sent at 05/01/2024  9:57 AM EST  Reason for Triage: Patient is calling to report nausea & dizziness from not having her psych meds. A Message was left for the patient to call back and schedule an appt with PCP or request a new referral to psych. Patient open to schedule with PCP. And in agreement the psych meds are work and in agreement to compliance. Agent scheduled appointment with PCP for Tuesday December 30,2025.   Reason for Disposition  [1] New or changed anti-depressant medication > 2 weeks ago AND [2] not feeling any better    Ran out of meds  Answer Assessment - Initial Assessment Questions Additional info: Patient reports she has been out of her medication for 3 days and is struggling without her medication. Patient access specialist has scheduled her with pcp on 05/05/24, patient had previously been scheduled on 05/04/24 with another provider, this appointment has been cancelled. Patient denies any thought of SI/HI/SIB, reports she has the number for crisis services and will call back if she has any further declined, needs, or questions.    1. CONCERN: What happened that made you call today?     Out of medication 2. DEPRESSION SYMPTOM SCREENING: How are you feeling overall? (e.g., decreased energy, increased sleeping or difficulty sleeping, difficulty concentrating, feelings of sadness, guilt, hopelessness, or worthlessness)     depressed 3. RISK OF HARM - SUICIDAL IDEATION:  Do  you ever have thoughts of hurting or killing yourself?  (e.g., yes, no, no but preoccupation with thoughts about death)     Denies-'never' has been suicidal 4. RISK OF HARM - HOMICIDAL IDEATION:  Do you ever have thoughts of hurting or killing someone else?  (e.g., yes, no, no but preoccupation with thoughts about death)     Denies  5. FUNCTIONAL IMPAIRMENT: How have things been going for you overall? Have you had more difficulty than usual doing your normal daily activities?  (e.g., better, same, worse; self-care, school, work, interactions)     Struggling not going well at all 6. SUPPORT: Who is with you now? Who do you live with? Do you have family or friends who you can talk to?      No, I do not talk with anyone about this 7. THERAPIST: Do you have a counselor or therapist? If Yes, ask: What is their name?     Not currently 8. STRESSORS: Has there been any new stress or recent changes in your life?     Out of medications 9. ALCOHOL USE OR SUBSTANCE USE (DRUG USE): Do you drink alcohol or use any illegal drugs?     Not disclosed 10. OTHER: Do you have any other physical symptoms right now? (e.g., fever)      Sweating when anxious, crying when depressed.  Protocols used: Depression-A-AH

## 2024-05-04 ENCOUNTER — Ambulatory Visit: Admitting: Family Medicine

## 2024-05-05 ENCOUNTER — Other Ambulatory Visit: Payer: Self-pay | Admitting: Family Medicine

## 2024-05-05 ENCOUNTER — Ambulatory Visit: Admitting: Family Medicine

## 2024-05-05 ENCOUNTER — Encounter: Payer: Self-pay | Admitting: Family Medicine

## 2024-05-05 VITALS — BP 138/83 | HR 82 | Temp 98.1°F | Ht 66.0 in | Wt 222.2 lb

## 2024-05-05 DIAGNOSIS — F259 Schizoaffective disorder, unspecified: Secondary | ICD-10-CM | POA: Diagnosis not present

## 2024-05-05 DIAGNOSIS — F321 Major depressive disorder, single episode, moderate: Secondary | ICD-10-CM

## 2024-05-05 DIAGNOSIS — Z23 Encounter for immunization: Secondary | ICD-10-CM | POA: Diagnosis not present

## 2024-05-05 DIAGNOSIS — F411 Generalized anxiety disorder: Secondary | ICD-10-CM

## 2024-05-05 MED ORDER — CLONAZEPAM 1 MG PO TABS: 1.0000 mg | ORAL_TABLET | Freq: Two times a day (BID) | ORAL | 0 refills | Status: AC

## 2024-05-05 MED ORDER — VRAYLAR 6 MG PO CAPS: 1.0000 | ORAL_CAPSULE | Freq: Every day | ORAL | 0 refills | Status: AC

## 2024-05-05 MED ORDER — QUETIAPINE FUMARATE 200 MG PO TABS: 200.0000 mg | ORAL_TABLET | Freq: Every day | ORAL | 0 refills | Status: AC

## 2024-05-05 MED ORDER — VENLAFAXINE HCL ER 150 MG PO CP24: 300.0000 mg | ORAL_CAPSULE | Freq: Every day | ORAL | 0 refills | Status: AC

## 2024-05-05 NOTE — Patient Instructions (Signed)
 As we discussed today, further fills of your mental health medications will have to BE PROVIDED BY PSYCHIATRIST.  You have been given a 30 day supply to get you through until you can establish with someone new.  I have placed a STAT referral to try and get you someone.  You should be contacted in the next week but if you are not, PLEASE CALL the office to check on referral.

## 2024-05-05 NOTE — Progress Notes (Signed)
 "  Subjective: RR:[facility] PCP: Jolinda Norene HERO, DO YEP:Rebecca Moreno is a 58 y.o. female presenting to clinic today for:  Patient previously a patient of Dr Vickey.  She was dismissed from practice 03/17/24 due to repeated no shows to appointments.  Current medications include Seroquel  200 mg nightly, clonazepam  1 mg twice daily, Effexor  300 mg daily, Lamictal  300 mg nightly and Vraylar  6mg  daily.  Last refill of clonazepam  was 04/10/2024.  She is treated for schizoaffective disorder, anxiety disorder and ?Bipolar depression. Attempted to establish care with a provider in [place] but was told that they are not taking any of patients currently.  She has not tried to seek care elsewhere yet.  She reports history of hallucinations but has not had any currently since being back from [place].  She denies any adverse side effects of the medications including excessive daytime sedation, confusion, unexplained fevers, heart palpitations etc.  She is not currently in any counseling.   ROS: Per HPI  Allergies[1] Past Medical History:  Diagnosis Date   Acute pyelonephritis    Asthma    Bipolar disorder (HCC)    Cerebrovascular accident (CVA) due to occlusion of left carotid artery (HCC) 09/18/2019   Last Assessment & Plan:   Formatting of this note might be different from the original.     Colon polyp    Complication of anesthesia    sometimes hard to put to sleep   Depression    Bipolar   Gastric ulcer    around 2013.  stress related   GERD (gastroesophageal reflux disease)    History of kidney stones    Hyperlipidemia    Hypothyroid    Polyp, uterus corpus    Sepsis due to Escherichia coli (E. coli) (HCC) 09/04/2018   Sleep apnea    no cpap used   Stroke [facility])    Current Medications[2] Social History   Socioeconomic History   Marital status: Married    Spouse name: Not on file   Number of children: 1   Years of education: Not on file   Highest  education level: High school graduate  Occupational History   Occupation: disability  Tobacco Use   Smoking status: Former    Current packs/day: 0.00    Average packs/day: 0.5 packs/day for 40.0 years (20.0 ttl pk-yrs)    Types: Cigarettes    Start date: 34    Quit date: 2024    Years since quitting: 1.9   Smokeless tobacco: Never   Tobacco comments:    Is smoking about 0.5 of a pack and has a patch placed on   Vaping Use   Vaping status: Never Used  Substance and Sexual Activity   Alcohol use: Never   Drug use: Not Currently   Sexual activity: Yes    Partners: Male    Birth control/protection: Surgical    Comment: tubal  Other Topics Concern   Not on file  Social History Narrative   Not on file   Social Drivers of Health   Tobacco Use: Medium Risk (04/15/2024)   Received from [facility]   Patient History    Smoking Tobacco Use: Former    Smokeless Tobacco Use: Never    Passive Exposure: Not on file  Financial Resource Strain: Low Risk (04/15/2024)   [facility]   Overall Financial Resource Strain (CARDIA)    How hard is it for you to pay for the very basics like food, housing, medical care,  and heating?: Not very hard  Food Insecurity: Food Insecurity Present (04/15/2024)   Received from [facility]   Epic    Within the past 12 months, you worried that your food would run out before you got the money to buy more.: Sometimes true    Within the past 12 months, the food you bought just didn't last and you didn't have money to get more.: Never true  Transportation Needs: No Transportation Needs (04/15/2024)   Received from [facility]    In the past 12 months, has lack of transportation kept you from medical appointments or from getting medications?: No    In the past 12 months, has lack of transportation kept you from meetings, work, or from getting things needed for daily living?: No  Physical Activity: Inactive (04/15/2024)   Received  from [facility]   Exercise Vital Sign    On average, how many days per week do you engage in moderate to strenuous exercise (like a brisk walk)?: 0 days    Minutes of Exercise per Session: Not on file  Stress: No Stress Concern Present (04/15/2024)   Received from [facility] - Occupational Stress Questionnaire    Do you feel stress - tense, restless, nervous, or anxious, or unable to sleep at night because your mind is troubled all the time - these days?: Only a little  Social Connections: Moderately Integrated (04/15/2024)   Received from [facility]   Social Network    How would you rate your social network (family, work, friends)?: Adequate participation with social networks  Intimate Partner Violence: Not At Risk (04/15/2024)   Received from [facility]   HITS    Over the last 12 months how often did your partner physically hurt you?: Never    Over the last 12 months how often did your partner insult you or talk down to you?: Rarely    Over the last 12 months how often did your partner threaten you with physical harm?: Never    Over the last 12 months how often did your partner scream or curse at you?: Rarely  Depression (PHQ2-9): Low Risk (01/20/2024)   Depression (PHQ2-9)    PHQ-2 Score: 1  Recent Concern: Depression (PHQ2-9) - Medium Risk (11/26/2023)   Depression (PHQ2-9)    PHQ-2 Score: 9  Alcohol Screen: Low Risk (07/17/2023)   Alcohol Screen    Last Alcohol Screening Score (AUDIT): 0  Housing: Low Risk (04/15/2024)   Received from [facility]    In the last 12 months, was there a time when you were not able to pay the mortgage or rent on time?: No    In the past 12 months, how many times have you moved where you were living?: 0    At any time in the past 12 months, were you homeless or living in a shelter (including now)?: No  Utilities: Not At Risk (04/15/2024)   Received from [facility]    In the  past 12 months has the electric, gas, oil, or water  company threatened to shut off services in your home?: No  Health Literacy: Adequate Health Literacy (07/17/2023)   B1300 Health Literacy    Frequency of need for help with medical instructions: Never   Family History  Problem Relation Age of Onset   Anxiety disorder Mother    Hypertension Mother    Heart failure Mother  Stroke Mother    Hyperlipidemia Father    Diabetes Father    Stroke Father    Hypertension Father    Colon polyps Father        older than 24   Diabetes Sister    Hypertension Sister    Ovarian cancer Sister    Ovarian cancer Maternal Grandmother    Colon cancer Maternal Grandmother    Renal Disease Paternal Grandmother    Heart attack Paternal Grandfather    Asthma Daughter    Bipolar disorder Other    Asthma Niece     Objective: Office vital signs reviewed. LMP 12/13/2017   Physical Examination:  General: Awake, alert, well nourished, No acute distress HEENT: sclera white,MMM Cardio: regular rate and rhythm, S1S2 heard, no murmurs appreciated Pulm: clear to auscultation bilaterally, no wheezes, rhonchi or rales; normal work of breathing on room air Psych: Mood is stable, speech is normal.  Does not appear to be responding to internal stimuli.     05/05/2024    9:47 AM 01/20/2024    4:16 PM 11/26/2023    3:22 PM  Depression screen PHQ 2/9  Decreased Interest 1 0 1  Down, Depressed, Hopeless 2 1 1   PHQ - 2 Score 3 1 2   Altered sleeping 0  1  Tired, decreased energy 2  1  Change in appetite 1  1  Feeling bad or failure about yourself  0  1  Trouble concentrating 1  1  Moving slowly or fidgety/restless 1  1  Suicidal thoughts 0  1  PHQ-9 Score 8  9   Difficult doing work/chores Not difficult at all  Not difficult at all     Data saved with a previous flowsheet row definition      05/05/2024    9:47 AM 01/20/2024    4:16 PM 11/26/2023    3:22 PM 10/02/2023    2:20 PM  GAD 7 : Generalized  Anxiety Score  Nervous, Anxious, on Edge 1 3 0 1  Control/stop worrying 1 0 1 2  Worry too much - different things 0 1 1 2   Trouble relaxing 2 0 1 1  Restless 0 2 1 1   Easily annoyed or irritable 0 2 1 1   Afraid - awful might happen 0 0 0 1  Total GAD 7 Score 4 8 5 9   Anxiety Difficulty Somewhat difficult  Somewhat difficult Somewhat difficult    Assessment/ Plan: 58 y.o. female   Schizoaffective disorder, unspecified type (HCC) - Plan: Ambulatory referral to Psychiatry, Cariprazine  HCl (VRAYLAR ) 6 MG CAPS, clonazePAM  (KLONOPIN ) 1 MG tablet, QUEtiapine  (SEROQUEL ) 200 MG tablet, venlafaxine  XR (EFFEXOR -XR) 150 MG 24 hr capsule  GAD (generalized anxiety disorder) - Plan: Ambulatory referral to Psychiatry, Cariprazine  HCl (VRAYLAR ) 6 MG CAPS, clonazePAM  (KLONOPIN ) 1 MG tablet, QUEtiapine  (SEROQUEL ) 200 MG tablet, venlafaxine  XR (EFFEXOR -XR) 150 MG 24 hr capsule  Current moderate episode of major depressive disorder without prior episode (HCC) - Plan: Ambulatory referral to Psychiatry, Cariprazine  HCl (VRAYLAR ) 6 MG CAPS, clonazePAM  (KLONOPIN ) 1 MG tablet, QUEtiapine  (SEROQUEL ) 200 MG tablet, venlafaxine  XR (EFFEXOR -XR) 150 MG 24 hr capsule  Immunization due - Plan: Pneumococcal conjugate vaccine 20-valent (Prevnar 20), Heplisav-B (HepB-CPG) Vaccine   Stat referral to psychiatry placed as these medications are beyond my scope of practice.  She has been provided a 1 month supply so as to prevent withdrawal.  We discussed importance of making sure that she establishes care ASAP with a psychiatrist.  Will defer further refills  to their expertise as she is on multiple high risk medications.  Her pneumococcal vaccination and hepatitis B vaccinations were administered during today's visit  National narcotic database reviewed and there were no red flags.  We did not complete UDS and CSC as I do not anticipate continuing to manage any of her psychiatric medications  Fleet Higham M Alben Jepsen, DO [facility] [phone]     [1]  Allergies Allergen Reactions   Lithium Other (See Comments)    Psoriasis   [2]  Current Outpatient Medications:    albuterol  (VENTOLIN  HFA) 108 (90 Base) MCG/ACT inhaler, Inhale 1 puff into the lungs every 6 (six) hours as needed for wheezing or shortness of breath., Disp: 6.7 g, Rfl: 1   atorvastatin  (LIPITOR) 80 MG tablet, Take 1 tablet (80 mg total) by mouth at bedtime., Disp: 100 tablet, Rfl: 3   betamethasone  valerate ointment (VALISONE ) 0.1 %, Apply topically 2 (two) times daily as needed. (Patient not taking: Reported on 02/05/2024), Disp: 45 g, Rfl: 5   calcipotriene  (DOVONEX ) 0.005 % cream, Apply topically 2 (two) times daily. For psoriasis (Patient not taking: Reported on 02/05/2024), Disp: 60 g, Rfl: 0   CALCIUM  CITRATE-VITAMIN D3 PO, Take 1 tablet by mouth daily., Disp: , Rfl:    Cholecalciferol (VITAMIN D3) 5000 units TABS, Take 5,000 Units by mouth daily. , Disp: , Rfl:    clobetasol  (TEMOVATE ) 0.05 % external solution, Apply 1 Application topically 2 (two) times daily as needed (psoriasis of scalp). (Patient not taking: Reported on 02/05/2024), Disp: 50 mL, Rfl: 2   clonazePAM  (KLONOPIN ) 1 MG tablet, Take 1 tablet (1 mg total) by mouth 2 (two) times daily., Disp: 60 tablet, Rfl: 0   Dexlansoprazole  30 MG capsule DR, TAKE ONE CAPSULE DAILY, Disp: 90 capsule, Rfl: 1   famotidine  (PEPCID ) 20 MG tablet, TAKE ONE TABLET BY MOUTH EVERY DAY, Disp: 90 tablet, Rfl: 0   fluticasone  (FLONASE ) 50 MCG/ACT nasal spray, SPRAY ONE SPRAY IN EACH NOSTRIL DAILY AT night TO help with morning congestion, Disp: 16 g, Rfl: 5   furosemide  (LASIX ) 20 MG tablet, Take 1 tab daily for 5 days, okay to take PRN for 7 days (Patient not taking: Reported on 02/05/2024), Disp: 30 tablet, Rfl: 0   gabapentin  (NEURONTIN ) 300 MG capsule, TAKE ONE CAPSULE AT SUPPER AND AT BEDTIME, Disp: 180 capsule, Rfl: 1   lamoTRIgine  (LAMICTAL ) 100 MG tablet, Take 3 tablets (300 mg  total) by mouth at bedtime., Disp: 90 tablet, Rfl: 1   levothyroxine  (SYNTHROID ) 50 MCG tablet, TAKE ONE TABLET DAILY, Disp: 90 tablet, Rfl: 3   loratadine  (CLARITIN ) 10 MG tablet, TAKE ONE TABLET DAILY, Disp: 30 tablet, Rfl: 5   magnesium gluconate (MAGONATE) 500 MG tablet, Take 500 mg by mouth daily., Disp: , Rfl:    Multiple Vitamins-Minerals (MULTIVITAMIN WOMEN PO), Take 1 tablet by mouth daily., Disp: , Rfl:    QUEtiapine  (SEROQUEL ) 200 MG tablet, Take 1 tablet (200 mg total) by mouth at bedtime., Disp: 30 tablet, Rfl: 0   SYMBICORT  80-4.5 MCG/ACT inhaler, INHALE TWO PUFFS TWICE DAILY. RINSE MOUTH AFTER EACH USE., Disp: 10.2 g, Rfl: 3   tiZANidine  (ZANAFLEX ) 4 MG tablet, TAKE ONE-HALF TO 1 TABLET EVERY 8 HOURS AS NEEDED FOR MUSCLE SPASMS, Disp: 60 tablet, Rfl: 1   triamcinolone  cream (KENALOG ) 0.1 %, Apply 1 Application topically 2 (two) times daily. (Patient not taking: Reported on 02/05/2024), Disp: 80 g, Rfl: 0   venlafaxine  XR (EFFEXOR -XR) 150 MG 24  hr capsule, Take 2 capsules (300 mg total) by mouth daily., Disp: 60 capsule, Rfl: 1  "

## 2024-05-06 ENCOUNTER — Ambulatory Visit (HOSPITAL_BASED_OUTPATIENT_CLINIC_OR_DEPARTMENT_OTHER): Admitting: Pulmonary Disease

## 2024-05-11 ENCOUNTER — Ambulatory Visit (HOSPITAL_BASED_OUTPATIENT_CLINIC_OR_DEPARTMENT_OTHER): Admitting: Pulmonary Disease

## 2024-05-18 ENCOUNTER — Encounter: Payer: Self-pay | Admitting: Nurse Practitioner

## 2024-05-18 ENCOUNTER — Ambulatory Visit: Admitting: Nurse Practitioner

## 2024-05-18 VITALS — BP 122/82 | HR 82 | Temp 98.1°F | Ht 66.0 in | Wt 219.0 lb

## 2024-05-18 DIAGNOSIS — H60501 Unspecified acute noninfective otitis externa, right ear: Secondary | ICD-10-CM | POA: Diagnosis not present

## 2024-05-18 DIAGNOSIS — L409 Psoriasis, unspecified: Secondary | ICD-10-CM | POA: Insufficient documentation

## 2024-05-18 DIAGNOSIS — L03211 Cellulitis of face: Secondary | ICD-10-CM | POA: Diagnosis not present

## 2024-05-18 MED ORDER — CEPHALEXIN 500 MG PO CAPS: 500.0000 mg | ORAL_CAPSULE | Freq: Two times a day (BID) | ORAL | 0 refills | Status: AC

## 2024-05-18 NOTE — Progress Notes (Signed)
 "    Subjective:  Patient ID: Rebecca Moreno, female    DOB: [birth year], 59 y.o.   MRN: [id number]  Patient Care Team: Jolinda Norene HERO, DO as PCP - General (Family Medicine) Vickey Mettle, MD as Consulting Physician (Psychiatry) Shaaron Lamar HERO, MD as Consulting Physician (Gastroenterology) Devere Lonni Righter, MD as Consulting Physician (Urology)   Chief Complaint:  cellulitis (Cellulitis on right side of face and ear. Worried its going into eye. Itching with the rash. )   HPI: Rebecca Moreno is a 59 y.o. female presenting on 05/18/2024 for cellulitis (Cellulitis on right side of face and ear. Worried its going into eye. Itching with the rash. )   Discussed the use of AI scribe software for clinical note transcription with the patient, who gave verbal consent to proceed.  History of Present Illness Rebecca Moreno is a 59 year old female who presents with cellulitis on the right side of her face.  She woke up this morning with cellulitis on the right side of her face and ears. She has experienced similar episodes in the past, with the last occurrence in November 2025, for which she was treated in the emergency department with clindamycin.  She denies any pain associated with the current episode but notes a sensation of warmth on her face, which she associates with fever. She confirms having a fever, noting that her face feels hot.  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 There is drainage from her ear. Denies any changes in vision        Relevant past medical, surgical, family, and social history reviewed and updated as indicated.  Allergies and medications reviewed and updated. Data reviewed: Chart in Epic.   Past Medical History:  Diagnosis Date   Acute pyelonephritis    Asthma    Bipolar disorder (HCC)    Cerebrovascular accident (CVA) due to occlusion of left carotid artery (HCC) 09/18/2019   Last  Assessment & Plan:   Formatting of this note might be different from the original.     Colon polyp    Complication of anesthesia    sometimes hard to put to sleep   Depression    Bipolar   Gastric ulcer    around 2013.  stress related   GERD (gastroesophageal reflux disease)    History of kidney stones    Hyperlipidemia    Hypothyroid    Polyp, uterus corpus    Sepsis due to Escherichia coli (E. coli) (HCC) 09/04/2018   Sleep apnea    no cpap used   Stroke [facility])     Past Surgical History:  Procedure Laterality Date   ABLATION ON ENDOMETRIOSIS     BREAST BIOPSY Right 11/14/2023   US  RT BREAST BX W LOC DEV 1ST LESION IMG BX SPEC US  GUIDE 11/14/2023 Mir, Aliene SAUNDERS, MD [facility]-ULTRASOUND   COLONOSCOPY     Per patient, done around 2013 in [place] , had polyp and overdue for follow up.   COLONOSCOPY WITH PROPOFOL  N/A 05/12/2018   Procedure: COLONOSCOPY WITH PROPOFOL ;  Surgeon: Shaaron Lamar HERO, MD;  Location: [facility];  Service: Endoscopy;  Laterality: N/A;  12:00pm   CYSTOSCOPY W/ URETERAL STENT PLACEMENT Left 09/01/2018   Procedure: CYSTOSCOPY WITH RETROGRADE PYELOGRAM/URETERAL STENT PLACEMENT;  Surgeon: Devere Lonni Righter, MD;  Location: [facility];  Service: Urology;  Laterality: Left;   CYSTOSCOPY/URETEROSCOPY/HOLMIUM LASER/STENT PLACEMENT Left 09/17/2018   Procedure: CYSTOSCOPY/URETEROSCOPY/HOLMIUM LASER/STENT PLACEMENT;  Surgeon: Devere Lonni  Beverley, MD;  Location: [facility];  Service: Urology;  Laterality: Left;  ONLY NEEDS 45 MIN   OVARIAN CYST SURGERY Left    POLYPECTOMY  05/12/2018   Procedure: POLYPECTOMY;  Surgeon: Shaaron Lamar HERO, MD;  Location: [facility];  Service: Endoscopy;;  polyp at splenic flexure   TEMPOROMANDIBULAR JOINT SURGERY     9 surgeries   TUBAL LIGATION     vaginal childbirth     1    Social History   Socioeconomic History   Marital status: Married    Spouse name: Not on file   Number of children: 1   Years of education: Not on file    Highest education level: High school graduate  Occupational History   Occupation: disability  Tobacco Use   Smoking status: Former    Current packs/day: 0.00    Average packs/day: 0.5 packs/day for 40.0 years (20.0 ttl pk-yrs)    Types: Cigarettes    Start date: 70    Quit date: 2024    Years since quitting: 2.0   Smokeless tobacco: Never   Tobacco comments:    Is smoking about 0.5 of a pack and has a patch placed on   Vaping Use   Vaping status: Never Used  Substance and Sexual Activity   Alcohol use: Never   Drug use: Not Currently   Sexual activity: Yes    Partners: Male    Birth control/protection: Surgical    Comment: tubal  Other Topics Concern   Not on file  Social History Narrative   Not on file   Social Drivers of Health   Tobacco Use: Medium Risk (05/05/2024)   Patient History    Smoking Tobacco Use: Former    Smokeless Tobacco Use: Never    Passive Exposure: Not on file  Financial Resource Strain: Low Risk (04/15/2024)   Received from [facility]   Overall Financial Resource Strain (CARDIA)    How hard is it for you to pay for the very basics like food, housing, medical care, and heating?: Not very hard  Food Insecurity: Food Insecurity Present (04/15/2024)   Received from [facility]   Epic    Within the past 12 months, you worried that your food would run out before you got the money to buy more.: Sometimes true    Within the past 12 months, the food you bought just didn't last and you didn't have money to get more.: Never true  Transportation Needs: No Transportation Needs (04/15/2024)   Received from [facility]    In the past 12 months, has lack of transportation kept you from medical appointments or from getting medications?: No    In the past 12 months, has lack of transportation kept you from meetings, work, or from getting things needed for daily living?: No  Physical Activity: Inactive (04/15/2024)   Received from [facility]    Exercise Vital Sign    On average, how many days per week do you engage in moderate to strenuous exercise (like a brisk walk)?: 0 days    Minutes of Exercise per Session: Not on file  Stress: No Stress Concern Present (04/15/2024)   Received from [facility] - Occupational Stress Questionnaire    Do you feel stress - tense, restless, nervous, or anxious, or unable to sleep at night because your mind is troubled all the time - these days?: Only a little  Social Connections: Moderately Integrated (  04/15/2024)   Received from [facility]   Social Network    How would you rate your social network (family, work, friends)?: Adequate participation with social networks  Intimate Partner Violence: Not At Risk (04/15/2024)   Received from [facility]   HITS    Over the last 12 months how often did your partner physically hurt you?: Never    Over the last 12 months how often did your partner insult you or talk down to you?: Rarely    Over the last 12 months how often did your partner threaten you with physical harm?: Never    Over the last 12 months how often did your partner scream or curse at you?: Rarely  Depression (PHQ2-9): Medium Risk (05/05/2024)   Depression (PHQ2-9)    PHQ-2 Score: 8  Alcohol Screen: Low Risk (07/17/2023)   Alcohol Screen    Last Alcohol Screening Score (AUDIT): 0  Housing: Low Risk (04/15/2024)   Received from [facility]    In the last 12 months, was there a time when you were not able to pay the mortgage or rent on time?: No    In the past 12 months, how many times have you moved where you were living?: 0    At any time in the past 12 months, were you homeless or living in a shelter (including now)?: No  Utilities: Not At Risk (04/15/2024)   Received from [facility]    In the past 12 months has the electric, gas, oil, or water  company threatened to shut off services in your home?: No  Health  Literacy: Adequate Health Literacy (07/17/2023)   B1300 Health Literacy    Frequency of need for help with medical instructions: Never    Outpatient Encounter Medications as of 05/18/2024  Medication Sig   albuterol  (VENTOLIN  HFA) 108 (90 Base) MCG/ACT inhaler Inhale 1 puff into the lungs every 6 (six) hours as needed for wheezing or shortness of breath.   atorvastatin  (LIPITOR) 80 MG tablet Take 1 tablet (80 mg total) by mouth at bedtime.   CALCIUM  CITRATE-VITAMIN D3 PO Take 1 tablet by mouth daily.   Cariprazine  HCl (VRAYLAR ) 6 MG CAPS Take 1 capsule (6 mg total) by mouth daily.   Cholecalciferol (VITAMIN D3) 5000 units TABS Take 5,000 Units by mouth daily.    clonazePAM  (KLONOPIN ) 1 MG tablet Take 1 tablet (1 mg total) by mouth 2 (two) times daily.   Dexlansoprazole  30 MG capsule DR TAKE ONE CAPSULE DAILY   famotidine  (PEPCID ) 20 MG tablet TAKE ONE TABLET BY MOUTH EVERY DAY   gabapentin  (NEURONTIN ) 300 MG capsule TAKE ONE CAPSULE AT SUPPER AND AT BEDTIME   levothyroxine  (SYNTHROID ) 50 MCG tablet TAKE ONE TABLET DAILY   loratadine  (CLARITIN ) 10 MG tablet TAKE ONE TABLET DAILY   magnesium gluconate (MAGONATE) 500 MG tablet Take 500 mg by mouth daily.   Multiple Vitamins-Minerals (MULTIVITAMIN WOMEN PO) Take 1 tablet by mouth daily.   QUEtiapine  (SEROQUEL ) 200 MG tablet Take 1 tablet (200 mg total) by mouth at bedtime.   SYMBICORT  80-4.5 MCG/ACT inhaler INHALE TWO PUFFS TWICE DAILY. RINSE MOUTH AFTER EACH USE.   tiZANidine  (ZANAFLEX ) 4 MG tablet TAKE ONE-HALF TO 1 TABLET EVERY 8 HOURS AS NEEDED FOR MUSCLE SPASMS   venlafaxine  XR (EFFEXOR -XR) 150 MG 24 hr capsule Take 2 capsules (300 mg total) by mouth daily.   betamethasone  valerate ointment (VALISONE ) 0.1 % Apply topically 2 (two) times daily as needed. (  Patient not taking: Reported on 05/05/2024)   calcipotriene  (DOVONEX ) 0.005 % cream Apply topically 2 (two) times daily. For psoriasis (Patient not taking: Reported on 05/05/2024)    clobetasol  (TEMOVATE ) 0.05 % external solution Apply 1 Application topically 2 (two) times daily as needed (psoriasis of scalp). (Patient not taking: Reported on 05/05/2024)   fluticasone  (FLONASE ) 50 MCG/ACT nasal spray SPRAY ONE SPRAY IN EACH NOSTRIL DAILY AT night TO help with morning congestion (Patient not taking: Reported on 05/05/2024)   furosemide  (LASIX ) 20 MG tablet Take 1 tab daily for 5 days, okay to take PRN for 7 days (Patient not taking: Reported on 05/05/2024)   lamoTRIgine  (LAMICTAL ) 100 MG tablet Take 3 tablets (300 mg total) by mouth at bedtime. (Patient not taking: Reported on 05/18/2024)   triamcinolone  cream (KENALOG ) 0.1 % Apply 1 Application topically 2 (two) times daily. (Patient not taking: Reported on 05/05/2024)   No facility-administered encounter medications on file as of 05/18/2024.    Allergies[1]  Pertinent ROS per HPI, otherwise unremarkable      Objective:  BP 122/82   Pulse 82   Temp 98.1 F (36.7 C)   Ht 5' 6 (1.676 m)   Wt 219 lb (99.3 kg)   LMP 12/13/2017   SpO2 93%   BMI 35.35 kg/m    Wt Readings from Last 3 Encounters:  05/18/24 219 lb (99.3 kg)  05/05/24 222 lb 4 oz (100.8 kg)  02/05/24 246 lb 1.6 oz (111.6 kg)    Physical Exam Vitals and nursing note reviewed.  Constitutional:      Appearance: She is obese.  HENT:     Head: Normocephalic and atraumatic.     Right Ear: Swelling present.     Left Ear: Hearing, tympanic membrane, ear canal and external ear normal.     Ears:     Comments: Psoriasis     Nose: Nose normal.     Mouth/Throat:     Mouth: Mucous membranes are moist.  Eyes:     General: Lids are normal. Vision grossly intact. Gaze aligned appropriately. No scleral icterus.    Extraocular Movements: Extraocular movements intact.     Conjunctiva/sclera: Conjunctivae normal.     Pupils: Pupils are equal, round, and reactive to light.  Neck:     Vascular: No carotid bruit.  Cardiovascular:     Heart sounds: Normal heart  sounds.  Pulmonary:     Effort: Pulmonary effort is normal.     Breath sounds: Normal breath sounds.  Musculoskeletal:        General: Normal range of motion.     Cervical back: Normal range of motion and neck supple. No rigidity or tenderness.     Right lower leg: No edema.     Left lower leg: No edema.  Lymphadenopathy:     Cervical: No cervical adenopathy.  Skin:    General: Skin is warm and dry.  Neurological:     Mental Status: She is alert and oriented to person, place, and time.  Psychiatric:        Mood and Affect: Mood normal.        Behavior: Behavior normal.        Thought Content: Thought content normal.        Judgment: Judgment normal.    Physical Exam HEENT: Right ear completely closed and inflamed with drainage.     Results for orders placed or performed during the hospital encounter of 03/06/24  ECHOCARDIOGRAM COMPLETE   Collection Time:  03/06/24  8:56 AM  Result Value Ref Range   Area-P 1/2 4.49 cm2   S' Lateral 2.91 cm   AR max vel 2.85 cm2   AV Area VTI 2.99 cm2   AV Area mean vel 2.85 cm2   AV Mean grad 2.0 mmHg   AV Peak grad 4.4 mmHg   Ao pk vel 1.05 m/s   Est EF 60 - 65%        Pertinent labs & imaging results that were available during my care of the patient were reviewed by me and considered in my medical decision making.  Assessment & Plan:  There are no diagnoses linked to this encounter.   Assessment and Plan Rebecca Moreno is a 59 year old Caucasian female seen today for facial cellulitis, no acute distress Assessment & Plan Facial cellulitis Acute right-sided facial cellulitis with warmth and fever. Previous episode in November 2025. - Prescribed cephalexin  500 mg BID for 7 days.  Acute otitis externa Right ear drainage and inflammation.  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      Continue all other maintenance medications.  Follow up plan: No follow-ups on file.   Continue healthy  lifestyle choices, including diet (rich in fruits, vegetables, and lean proteins, and low in salt and simple carbohydrates) and exercise (at least 30 minutes of moderate physical activity daily).  Educational handout given for   Cellulitis, Adult  Cellulitis is a skin infection. The infected area is often warm, red, swollen, and sore. It occurs most often on the legs, feet, and toes, but can happen on any part of the body. This condition can be life-threatening without treatment. It is very important to get treated right away. What are the causes? This condition is caused by bacteria. The bacteria enter through a break in the skin, such as: A cut. A burn. A bug bite. An animal bite. An open sore. A crack. What increases the risk? Having a weak body's defense system (immune system). Being older than 60 years old. Having a blood sugar problem (diabetes). Having a long-term liver disease (cirrhosis) or kidney disease. Being very overweight (obese). Having a skin problem, such as: An itchy rash. A rash caused by a fungus. A rash with blisters. Slow movement of blood in the veins (venous stasis). Fluid buildup below the skin (edema). This condition is more likely to occur in people who: Have open cuts, burns, bites, or scrapes on the skin. Have been treated with high-energy rays (radiation). Use IV drugs. What are the signs or symptoms? Skin that: Looks red or purple, or slightly darker than your usual skin color. Has streaks. Has spots. Is swollen. Is sore or painful when you touch it. Is warm. A fever. Chills. Blisters. Tiredness (fatigue). How is this treated? Medicines to treat infections or allergies. Rest. Placing cold or warm cloths on the skin. Staying in the hospital, if the condition is very bad. You may need medicines through an IV. Follow these instructions at home: Medicines Take over-the-counter and prescription medicines only as told by your doctor. If you  were prescribed antibiotics, take them as told by your doctor. Do not stop using them even if you start to feel better. General instructions Drink enough fluid to keep your pee (urine) pale yellow. Do not touch or rub the infected area. Raise (elevate) the infected area above the level of your heart while you are sitting or lying down. Return to your normal activities when your doctor says  that it is safe. Place cold or warm cloths on the area as told by your doctor. Keep all follow-up visits. Your doctor will need to make sure that a more serious infection is not developing. Contact a doctor if: You have a fever. You do not start to get better after 1-2 days of treatment. Your bone or joint under the infected area starts to hurt after the skin has healed. Your infection comes back in the same area or another area. Signs of this may include: You have a swollen bump in the area. Your red area gets larger, turns dark in color, or hurts more. You have more fluid coming from the wound. Pus or a bad smell develops in your infected area. You have more pain. You feel sick and have muscle aches and weakness. You develop vomiting or watery poop that will not go away. Get help right away if: You see red streaks coming from the area. You notice the skin turns purple or black and falls off. These symptoms may be an emergency. Get help right away. Call 911. Do not wait to see if the symptoms will go away. Do not drive yourself to the hospital. This information is not intended to replace advice given to you by your health care provider. Make sure you discuss any questions you have with your health care provider. Document Revised: 12/19/2021 Document Reviewed: 12/19/2021 Elsevier Patient Education  2024 Else[employer] Inc.    The above assessment and management plan was discussed with the patient. The patient verbalized understanding of and has agreed to the management plan. Patient is aware to call the  clinic if they develop any new symptoms or if symptoms persist or worsen. Patient is aware when to return to the clinic for a follow-up visit. Patient educated on when it is appropriate to go to the emergency department.   Rebecca Cassis Morton Hummer, DNP [facility] [address] [place], [place] [address] [phone]    [1]  Allergies Allergen Reactions   Lithium Other (See Comments)    Psoriasis    "

## 2024-05-27 ENCOUNTER — Other Ambulatory Visit: Payer: Self-pay | Admitting: Family Medicine

## 2024-05-27 DIAGNOSIS — G2581 Restless legs syndrome: Secondary | ICD-10-CM

## 2024-06-04 ENCOUNTER — Ambulatory Visit (INDEPENDENT_AMBULATORY_CARE_PROVIDER_SITE_OTHER): Admitting: *Deleted

## 2024-06-04 DIAGNOSIS — Z23 Encounter for immunization: Secondary | ICD-10-CM | POA: Diagnosis not present

## 2024-06-04 NOTE — Progress Notes (Signed)
 Patient is in office today for a nurse visit for Immunization. Patient Injection was given in the  Right deltoid. Patient tolerated injection well.

## 2024-06-10 ENCOUNTER — Other Ambulatory Visit: Payer: Self-pay | Admitting: Family Medicine

## 2024-06-10 DIAGNOSIS — F411 Generalized anxiety disorder: Secondary | ICD-10-CM

## 2024-06-10 DIAGNOSIS — F321 Major depressive disorder, single episode, moderate: Secondary | ICD-10-CM

## 2024-06-10 DIAGNOSIS — F259 Schizoaffective disorder, unspecified: Secondary | ICD-10-CM

## 2024-06-11 ENCOUNTER — Other Ambulatory Visit: Payer: Self-pay | Admitting: Family Medicine

## 2024-06-11 DIAGNOSIS — F321 Major depressive disorder, single episode, moderate: Secondary | ICD-10-CM

## 2024-06-11 DIAGNOSIS — F259 Schizoaffective disorder, unspecified: Secondary | ICD-10-CM

## 2024-06-11 DIAGNOSIS — F411 Generalized anxiety disorder: Secondary | ICD-10-CM

## 2024-06-12 ENCOUNTER — Telehealth: Payer: Self-pay | Admitting: Family Medicine

## 2024-06-12 NOTE — Telephone Encounter (Signed)
 Informed pt that at her last visit w/ PCP that she was given 30-d and referral was placed to see psychiatry and they are to take over her medications. I told her I understood that she had difficulties connecting to her visits with them but that she need to call them when we hung up to reschedule her appointment so that they can take over her care. She verbalized understanding, also explained this was for the RF request on the Clonazepam  as well which is in another encounter.

## 2024-06-12 NOTE — Telephone Encounter (Signed)
 Lynden Ang spoke with patient

## 2024-06-12 NOTE — Telephone Encounter (Unsigned)
 Copied from CRM [id number]. Topic: Clinical - Prescription Issue >> Jun 12, 2024  2:01 PM Joesph B wrote: Reason for CRM: patient requested a refill for her clonazePAM  (KLONOPIN ) 1 MG tablet. She states her psych cut her off the medication so she is requesting it from Dr.Gottschalk. please call pt. She has been trying to get a appt with [facility].

## 2024-06-12 NOTE — Telephone Encounter (Signed)
 Per PCP notes 05/05/24 30-d given till ext w/ Psych. Referral to [facility]  Per Referral Coordinator, Ezella technical difficulties joining visit on 1/21 & 2/4, office tried to resched again, pt did not answer Please advise on refills

## 2024-06-12 NOTE — Telephone Encounter (Signed)
 G has never RX this and it would have to be discussed with G in an office visit if she would even wright the rx . PATIENT NEEDS APPT.

## 2024-07-17 ENCOUNTER — Ambulatory Visit

## 2024-07-20 ENCOUNTER — Encounter: Payer: Self-pay | Admitting: Family Medicine

## 2024-07-22 ENCOUNTER — Ambulatory Visit: Payer: Self-pay
